# Patient Record
Sex: Male | Born: 1935 | Race: White | Hispanic: No | Marital: Married | Smoking: Never smoker
Health system: Southern US, Community
[De-identification: ages and names within clinical notes are randomized; demographics above are authoritative.]

## PROBLEM LIST (undated history)

## (undated) DIAGNOSIS — H269 Unspecified cataract: Secondary | ICD-10-CM

## (undated) DIAGNOSIS — F0280 Dementia in other diseases classified elsewhere without behavioral disturbance: Secondary | ICD-10-CM

## (undated) DIAGNOSIS — I509 Heart failure, unspecified: Secondary | ICD-10-CM

## (undated) DIAGNOSIS — E785 Hyperlipidemia, unspecified: Secondary | ICD-10-CM

## (undated) DIAGNOSIS — K559 Vascular disorder of intestine, unspecified: Secondary | ICD-10-CM

## (undated) DIAGNOSIS — N400 Enlarged prostate without lower urinary tract symptoms: Secondary | ICD-10-CM

## (undated) DIAGNOSIS — I10 Essential (primary) hypertension: Secondary | ICD-10-CM

## (undated) DIAGNOSIS — I639 Cerebral infarction, unspecified: Secondary | ICD-10-CM

## (undated) DIAGNOSIS — K219 Gastro-esophageal reflux disease without esophagitis: Secondary | ICD-10-CM

## (undated) DIAGNOSIS — Z5189 Encounter for other specified aftercare: Secondary | ICD-10-CM

## (undated) DIAGNOSIS — L719 Rosacea, unspecified: Secondary | ICD-10-CM

## (undated) DIAGNOSIS — F0150 Vascular dementia without behavioral disturbance: Secondary | ICD-10-CM

## (undated) DIAGNOSIS — I629 Nontraumatic intracranial hemorrhage, unspecified: Secondary | ICD-10-CM

## (undated) DIAGNOSIS — F419 Anxiety disorder, unspecified: Secondary | ICD-10-CM

## (undated) DIAGNOSIS — A159 Respiratory tuberculosis unspecified: Secondary | ICD-10-CM

## (undated) DIAGNOSIS — C801 Malignant (primary) neoplasm, unspecified: Secondary | ICD-10-CM

## (undated) DIAGNOSIS — I480 Paroxysmal atrial fibrillation: Secondary | ICD-10-CM

## (undated) DIAGNOSIS — G43909 Migraine, unspecified, not intractable, without status migrainosus: Secondary | ICD-10-CM

## (undated) DIAGNOSIS — I4892 Unspecified atrial flutter: Secondary | ICD-10-CM

## (undated) DIAGNOSIS — G309 Alzheimer's disease, unspecified: Secondary | ICD-10-CM

## (undated) HISTORY — DX: Unspecified atrial flutter: I48.92

## (undated) HISTORY — PX: VASECTOMY: SHX75

## (undated) HISTORY — DX: Nontraumatic intracranial hemorrhage, unspecified: I62.9

## (undated) HISTORY — DX: Malignant (primary) neoplasm, unspecified: C80.1

## (undated) HISTORY — DX: Paroxysmal atrial fibrillation: I48.0

## (undated) HISTORY — DX: Anxiety disorder, unspecified: F41.9

## (undated) HISTORY — DX: Rosacea, unspecified: L71.9

## (undated) HISTORY — DX: Respiratory tuberculosis unspecified: A15.9

## (undated) HISTORY — DX: Gastro-esophageal reflux disease without esophagitis: K21.9

## (undated) HISTORY — DX: Hyperlipidemia, unspecified: E78.5

## (undated) HISTORY — DX: Unspecified cataract: H26.9

## (undated) HISTORY — PX: POLYPECTOMY: SHX149

## (undated) HISTORY — PX: TONSILLECTOMY: SUR1361

## (undated) HISTORY — DX: Essential (primary) hypertension: I10

## (undated) HISTORY — PX: CORNEAL LACERATION REPAIR: SHX5331

## (undated) HISTORY — PX: INGUINAL HERNIA REPAIR: SHX194

## (undated) HISTORY — PX: CATARACT EXTRACTION, BILATERAL: SHX1313

## (undated) HISTORY — PX: COLONOSCOPY: SHX174

## (undated) HISTORY — DX: Cerebral infarction, unspecified: I63.9

## (undated) HISTORY — PX: CATARACT EXTRACTION: SUR2

## (undated) HISTORY — DX: Migraine, unspecified, not intractable, without status migrainosus: G43.909

## (undated) HISTORY — PX: EYE SURGERY: SHX253

## (undated) HISTORY — DX: Heart failure, unspecified: I50.9

## (undated) HISTORY — DX: Vascular disorder of intestine, unspecified: K55.9

## (undated) HISTORY — PX: APPENDECTOMY: SHX54

## (undated) HISTORY — DX: Encounter for other specified aftercare: Z51.89

## (undated) HISTORY — DX: Benign prostatic hyperplasia without lower urinary tract symptoms: N40.0

---

## 1986-01-01 ENCOUNTER — Encounter: Payer: Self-pay | Admitting: Internal Medicine

## 1987-01-14 ENCOUNTER — Encounter: Payer: Self-pay | Admitting: Internal Medicine

## 1990-04-04 ENCOUNTER — Encounter: Payer: Self-pay | Admitting: Internal Medicine

## 1990-04-05 ENCOUNTER — Encounter: Payer: Self-pay | Admitting: Internal Medicine

## 1996-01-10 ENCOUNTER — Encounter: Payer: Self-pay | Admitting: Internal Medicine

## 1998-01-07 ENCOUNTER — Emergency Department (HOSPITAL_COMMUNITY): Admission: EM | Admit: 1998-01-07 | Discharge: 1998-01-07 | Payer: Self-pay

## 1998-10-02 ENCOUNTER — Encounter: Payer: Self-pay | Admitting: Specialist

## 1998-10-02 ENCOUNTER — Ambulatory Visit (HOSPITAL_COMMUNITY): Admission: RE | Admit: 1998-10-02 | Discharge: 1998-10-02 | Payer: Self-pay | Admitting: Specialist

## 2001-01-18 ENCOUNTER — Ambulatory Visit (HOSPITAL_COMMUNITY): Admission: RE | Admit: 2001-01-18 | Discharge: 2001-01-18 | Payer: Self-pay | Admitting: Gastroenterology

## 2001-01-18 ENCOUNTER — Encounter: Payer: Self-pay | Admitting: Internal Medicine

## 2001-01-18 ENCOUNTER — Encounter (INDEPENDENT_AMBULATORY_CARE_PROVIDER_SITE_OTHER): Payer: Self-pay | Admitting: *Deleted

## 2001-01-18 ENCOUNTER — Encounter (INDEPENDENT_AMBULATORY_CARE_PROVIDER_SITE_OTHER): Payer: Self-pay

## 2004-01-28 ENCOUNTER — Encounter: Payer: Self-pay | Admitting: Internal Medicine

## 2006-03-08 ENCOUNTER — Encounter: Payer: Self-pay | Admitting: Internal Medicine

## 2008-02-28 ENCOUNTER — Encounter (INDEPENDENT_AMBULATORY_CARE_PROVIDER_SITE_OTHER): Payer: Self-pay | Admitting: *Deleted

## 2008-02-28 ENCOUNTER — Inpatient Hospital Stay (HOSPITAL_COMMUNITY): Admission: EM | Admit: 2008-02-28 | Discharge: 2008-03-05 | Payer: Self-pay | Admitting: Emergency Medicine

## 2008-02-28 ENCOUNTER — Ambulatory Visit: Payer: Self-pay | Admitting: Internal Medicine

## 2008-02-29 ENCOUNTER — Encounter (INDEPENDENT_AMBULATORY_CARE_PROVIDER_SITE_OTHER): Payer: Self-pay | Admitting: *Deleted

## 2008-02-29 ENCOUNTER — Encounter (INDEPENDENT_AMBULATORY_CARE_PROVIDER_SITE_OTHER): Payer: Self-pay | Admitting: Cardiology

## 2008-03-01 ENCOUNTER — Encounter (INDEPENDENT_AMBULATORY_CARE_PROVIDER_SITE_OTHER): Payer: Self-pay | Admitting: *Deleted

## 2008-03-02 ENCOUNTER — Encounter: Payer: Self-pay | Admitting: Internal Medicine

## 2008-03-02 ENCOUNTER — Encounter (INDEPENDENT_AMBULATORY_CARE_PROVIDER_SITE_OTHER): Payer: Self-pay | Admitting: *Deleted

## 2008-03-05 ENCOUNTER — Encounter (INDEPENDENT_AMBULATORY_CARE_PROVIDER_SITE_OTHER): Payer: Self-pay | Admitting: *Deleted

## 2008-03-14 ENCOUNTER — Encounter: Payer: Self-pay | Admitting: Internal Medicine

## 2008-03-21 DIAGNOSIS — M109 Gout, unspecified: Secondary | ICD-10-CM | POA: Insufficient documentation

## 2008-03-21 DIAGNOSIS — G43909 Migraine, unspecified, not intractable, without status migrainosus: Secondary | ICD-10-CM | POA: Insufficient documentation

## 2008-03-21 DIAGNOSIS — Z85828 Personal history of other malignant neoplasm of skin: Secondary | ICD-10-CM | POA: Insufficient documentation

## 2008-03-21 DIAGNOSIS — N400 Enlarged prostate without lower urinary tract symptoms: Secondary | ICD-10-CM | POA: Insufficient documentation

## 2008-03-21 DIAGNOSIS — Z8611 Personal history of tuberculosis: Secondary | ICD-10-CM | POA: Insufficient documentation

## 2008-03-21 DIAGNOSIS — F411 Generalized anxiety disorder: Secondary | ICD-10-CM | POA: Insufficient documentation

## 2008-03-21 DIAGNOSIS — Z8679 Personal history of other diseases of the circulatory system: Secondary | ICD-10-CM | POA: Insufficient documentation

## 2008-03-21 DIAGNOSIS — K5909 Other constipation: Secondary | ICD-10-CM | POA: Insufficient documentation

## 2008-03-21 DIAGNOSIS — Z87898 Personal history of other specified conditions: Secondary | ICD-10-CM | POA: Insufficient documentation

## 2008-03-21 DIAGNOSIS — E785 Hyperlipidemia, unspecified: Secondary | ICD-10-CM | POA: Insufficient documentation

## 2008-03-21 DIAGNOSIS — I10 Essential (primary) hypertension: Secondary | ICD-10-CM | POA: Insufficient documentation

## 2008-03-21 DIAGNOSIS — D126 Benign neoplasm of colon, unspecified: Secondary | ICD-10-CM | POA: Insufficient documentation

## 2008-03-21 DIAGNOSIS — L719 Rosacea, unspecified: Secondary | ICD-10-CM | POA: Insufficient documentation

## 2008-03-22 ENCOUNTER — Ambulatory Visit: Payer: Self-pay | Admitting: Internal Medicine

## 2008-03-22 DIAGNOSIS — R933 Abnormal findings on diagnostic imaging of other parts of digestive tract: Secondary | ICD-10-CM | POA: Insufficient documentation

## 2008-03-22 DIAGNOSIS — Z860100 Personal history of colon polyps, unspecified: Secondary | ICD-10-CM | POA: Insufficient documentation

## 2008-03-22 DIAGNOSIS — R1031 Right lower quadrant pain: Secondary | ICD-10-CM | POA: Insufficient documentation

## 2008-03-27 DIAGNOSIS — K559 Vascular disorder of intestine, unspecified: Secondary | ICD-10-CM | POA: Insufficient documentation

## 2008-04-24 ENCOUNTER — Encounter: Payer: Self-pay | Admitting: Internal Medicine

## 2008-04-24 ENCOUNTER — Ambulatory Visit: Payer: Self-pay | Admitting: Internal Medicine

## 2008-04-26 ENCOUNTER — Encounter: Payer: Self-pay | Admitting: Internal Medicine

## 2008-05-08 ENCOUNTER — Telehealth: Payer: Self-pay | Admitting: Internal Medicine

## 2008-08-01 ENCOUNTER — Encounter (INDEPENDENT_AMBULATORY_CARE_PROVIDER_SITE_OTHER): Payer: Self-pay | Admitting: *Deleted

## 2008-08-01 ENCOUNTER — Ambulatory Visit (HOSPITAL_BASED_OUTPATIENT_CLINIC_OR_DEPARTMENT_OTHER): Admission: RE | Admit: 2008-08-01 | Discharge: 2008-08-01 | Payer: Self-pay | Admitting: Surgery

## 2008-08-01 ENCOUNTER — Encounter (INDEPENDENT_AMBULATORY_CARE_PROVIDER_SITE_OTHER): Payer: Self-pay | Admitting: Surgery

## 2009-01-21 ENCOUNTER — Encounter: Admission: RE | Admit: 2009-01-21 | Discharge: 2009-01-21 | Payer: Self-pay | Admitting: Cardiology

## 2009-02-27 ENCOUNTER — Ambulatory Visit: Payer: Self-pay | Admitting: Internal Medicine

## 2009-02-27 ENCOUNTER — Encounter: Payer: Self-pay | Admitting: Internal Medicine

## 2009-02-27 DIAGNOSIS — R0609 Other forms of dyspnea: Secondary | ICD-10-CM | POA: Insufficient documentation

## 2009-02-27 DIAGNOSIS — R0989 Other specified symptoms and signs involving the circulatory and respiratory systems: Secondary | ICD-10-CM | POA: Insufficient documentation

## 2009-02-28 ENCOUNTER — Encounter: Payer: Self-pay | Admitting: Internal Medicine

## 2009-03-05 ENCOUNTER — Telehealth: Payer: Self-pay | Admitting: Internal Medicine

## 2009-03-05 ENCOUNTER — Encounter: Payer: Self-pay | Admitting: Internal Medicine

## 2009-03-14 ENCOUNTER — Ambulatory Visit: Payer: Self-pay | Admitting: Internal Medicine

## 2009-03-14 LAB — CONVERTED CEMR LAB
BUN: 15 mg/dL (ref 6–23)
Basophils Absolute: 0 10*3/uL (ref 0.0–0.1)
CO2: 30 meq/L (ref 19–32)
Chloride: 106 meq/L (ref 96–112)
Creatinine, Ser: 1.1 mg/dL (ref 0.4–1.5)
Eosinophils Absolute: 0.1 10*3/uL (ref 0.0–0.7)
MCHC: 34 g/dL (ref 30.0–36.0)
MCV: 92 fL (ref 78.0–100.0)
Monocytes Absolute: 0.5 10*3/uL (ref 0.1–1.0)
Neutrophils Relative %: 76.3 % (ref 43.0–77.0)
Platelets: 207 10*3/uL (ref 150.0–400.0)
Prothrombin Time: 28.1 s — ABNORMAL HIGH (ref 9.1–11.7)
WBC: 7.4 10*3/uL (ref 4.5–10.5)
aPTT: 42.5 s — ABNORMAL HIGH (ref 21.7–28.8)

## 2009-03-15 ENCOUNTER — Encounter (INDEPENDENT_AMBULATORY_CARE_PROVIDER_SITE_OTHER): Payer: Self-pay | Admitting: *Deleted

## 2009-03-27 ENCOUNTER — Encounter: Payer: Self-pay | Admitting: Internal Medicine

## 2009-03-28 ENCOUNTER — Telehealth: Payer: Self-pay | Admitting: Internal Medicine

## 2009-04-03 ENCOUNTER — Ambulatory Visit: Payer: Self-pay | Admitting: Internal Medicine

## 2009-04-03 LAB — CONVERTED CEMR LAB
BUN: 13 mg/dL (ref 6–23)
Creatinine, Ser: 1 mg/dL (ref 0.4–1.5)
Eosinophils Relative: 1 % (ref 0.0–5.0)
GFR calc non Af Amer: 78.11 mL/min (ref >60)
Monocytes Absolute: 0.6 10*3/uL (ref 0.1–1.0)
Monocytes Relative: 7.1 % (ref 3.0–12.0)
Neutrophils Relative %: 77.5 % — ABNORMAL HIGH (ref 43.0–77.0)
Platelets: 201 10*3/uL (ref 150.0–400.0)
Prothrombin Time: 26.7 s — ABNORMAL HIGH (ref 9.1–11.7)
WBC: 7.9 10*3/uL (ref 4.5–10.5)
aPTT: 38.7 s — ABNORMAL HIGH (ref 21.7–28.8)

## 2009-04-08 ENCOUNTER — Ambulatory Visit: Payer: Self-pay | Admitting: Internal Medicine

## 2009-04-08 ENCOUNTER — Ambulatory Visit (HOSPITAL_COMMUNITY): Admission: RE | Admit: 2009-04-08 | Discharge: 2009-04-08 | Payer: Self-pay | Admitting: Internal Medicine

## 2009-04-08 ENCOUNTER — Encounter: Payer: Self-pay | Admitting: Internal Medicine

## 2009-04-09 ENCOUNTER — Ambulatory Visit (HOSPITAL_COMMUNITY): Admission: RE | Admit: 2009-04-09 | Discharge: 2009-04-10 | Payer: Self-pay | Admitting: Internal Medicine

## 2009-04-09 ENCOUNTER — Ambulatory Visit: Payer: Self-pay | Admitting: Internal Medicine

## 2009-04-09 HISTORY — PX: OTHER SURGICAL HISTORY: SHX169

## 2009-04-17 ENCOUNTER — Ambulatory Visit: Payer: Self-pay | Admitting: Internal Medicine

## 2009-04-24 ENCOUNTER — Telehealth: Payer: Self-pay | Admitting: Internal Medicine

## 2009-04-25 ENCOUNTER — Telehealth: Payer: Self-pay | Admitting: Internal Medicine

## 2009-04-29 ENCOUNTER — Telehealth: Payer: Self-pay | Admitting: Internal Medicine

## 2009-05-01 ENCOUNTER — Encounter: Payer: Self-pay | Admitting: Internal Medicine

## 2009-05-01 ENCOUNTER — Ambulatory Visit: Payer: Self-pay

## 2009-05-08 ENCOUNTER — Encounter: Payer: Self-pay | Admitting: Internal Medicine

## 2009-05-14 ENCOUNTER — Ambulatory Visit (HOSPITAL_BASED_OUTPATIENT_CLINIC_OR_DEPARTMENT_OTHER): Admission: RE | Admit: 2009-05-14 | Discharge: 2009-05-14 | Payer: Self-pay | Admitting: Cardiology

## 2009-05-18 ENCOUNTER — Ambulatory Visit: Payer: Self-pay | Admitting: Internal Medicine

## 2009-05-29 ENCOUNTER — Ambulatory Visit: Payer: Self-pay | Admitting: Internal Medicine

## 2009-06-25 ENCOUNTER — Encounter: Payer: Self-pay | Admitting: Internal Medicine

## 2009-07-10 ENCOUNTER — Encounter: Payer: Self-pay | Admitting: Internal Medicine

## 2009-07-11 ENCOUNTER — Ambulatory Visit: Payer: Self-pay | Admitting: Internal Medicine

## 2009-07-19 ENCOUNTER — Ambulatory Visit: Payer: Self-pay | Admitting: Internal Medicine

## 2009-07-19 ENCOUNTER — Ambulatory Visit (HOSPITAL_COMMUNITY): Admission: RE | Admit: 2009-07-19 | Discharge: 2009-07-19 | Payer: Self-pay | Admitting: Internal Medicine

## 2009-08-26 ENCOUNTER — Ambulatory Visit: Payer: Self-pay | Admitting: Internal Medicine

## 2009-08-27 ENCOUNTER — Telehealth: Payer: Self-pay | Admitting: Internal Medicine

## 2009-09-30 ENCOUNTER — Encounter: Payer: Self-pay | Admitting: Internal Medicine

## 2009-10-08 ENCOUNTER — Ambulatory Visit: Payer: Self-pay | Admitting: Cardiology

## 2009-10-08 ENCOUNTER — Ambulatory Visit (HOSPITAL_COMMUNITY): Admission: RE | Admit: 2009-10-08 | Discharge: 2009-10-08 | Payer: Self-pay | Admitting: Internal Medicine

## 2009-10-08 ENCOUNTER — Ambulatory Visit: Payer: Self-pay | Admitting: Internal Medicine

## 2009-10-08 LAB — CONVERTED CEMR LAB
Basophils Absolute: 0.1 10*3/uL (ref 0.0–0.1)
Basophils Relative: 0.7 % (ref 0.0–3.0)
CO2: 27 meq/L (ref 19–32)
Chloride: 107 meq/L (ref 96–112)
Eosinophils Absolute: 0.1 10*3/uL (ref 0.0–0.7)
Glucose, Bld: 93 mg/dL (ref 70–99)
HCT: 46.4 % (ref 39.0–52.0)
Hemoglobin: 16.2 g/dL (ref 13.0–17.0)
Lymphs Abs: 1.2 10*3/uL (ref 0.7–4.0)
MCHC: 35 g/dL (ref 30.0–36.0)
Neutro Abs: 5.2 10*3/uL (ref 1.4–7.7)
Potassium: 4.3 meq/L (ref 3.5–5.1)
RBC: 5.19 M/uL (ref 4.22–5.81)
RDW: 14 % (ref 11.5–14.6)
Sodium: 142 meq/L (ref 135–145)
aPTT: 46 s — ABNORMAL HIGH (ref 21.7–28.8)

## 2009-10-14 ENCOUNTER — Ambulatory Visit (HOSPITAL_COMMUNITY): Admission: RE | Admit: 2009-10-14 | Discharge: 2009-10-14 | Payer: Self-pay | Admitting: Internal Medicine

## 2009-10-14 ENCOUNTER — Encounter: Payer: Self-pay | Admitting: Internal Medicine

## 2009-10-14 ENCOUNTER — Ambulatory Visit: Payer: Self-pay | Admitting: Internal Medicine

## 2009-10-15 ENCOUNTER — Ambulatory Visit (HOSPITAL_COMMUNITY): Admission: RE | Admit: 2009-10-15 | Discharge: 2009-10-16 | Payer: Self-pay | Admitting: Internal Medicine

## 2009-10-15 ENCOUNTER — Ambulatory Visit: Payer: Self-pay | Admitting: Internal Medicine

## 2009-10-15 HISTORY — PX: ABLATION OF DYSRHYTHMIC FOCUS: SHX254

## 2009-10-16 ENCOUNTER — Telehealth: Payer: Self-pay | Admitting: Internal Medicine

## 2009-11-08 ENCOUNTER — Encounter: Payer: Self-pay | Admitting: Physician Assistant

## 2009-11-22 ENCOUNTER — Ambulatory Visit: Payer: Self-pay | Admitting: Cardiovascular Disease

## 2009-11-22 ENCOUNTER — Encounter: Payer: Self-pay | Admitting: Physician Assistant

## 2010-01-02 ENCOUNTER — Ambulatory Visit: Payer: Self-pay | Admitting: Internal Medicine

## 2010-01-02 DIAGNOSIS — D689 Coagulation defect, unspecified: Secondary | ICD-10-CM | POA: Insufficient documentation

## 2010-01-02 DIAGNOSIS — I482 Chronic atrial fibrillation, unspecified: Secondary | ICD-10-CM | POA: Insufficient documentation

## 2010-01-02 DIAGNOSIS — I4891 Unspecified atrial fibrillation: Secondary | ICD-10-CM | POA: Insufficient documentation

## 2010-01-22 ENCOUNTER — Ambulatory Visit
Admission: RE | Admit: 2010-01-22 | Discharge: 2010-01-22 | Payer: Self-pay | Source: Home / Self Care | Attending: Internal Medicine | Admitting: Internal Medicine

## 2010-01-22 ENCOUNTER — Encounter: Payer: Self-pay | Admitting: Internal Medicine

## 2010-01-28 ENCOUNTER — Ambulatory Visit
Admission: RE | Admit: 2010-01-28 | Discharge: 2010-01-28 | Payer: Self-pay | Source: Home / Self Care | Attending: Internal Medicine | Admitting: Internal Medicine

## 2010-01-28 ENCOUNTER — Other Ambulatory Visit: Payer: Self-pay | Admitting: Internal Medicine

## 2010-01-28 DIAGNOSIS — D126 Benign neoplasm of colon, unspecified: Secondary | ICD-10-CM

## 2010-02-02 LAB — CONVERTED CEMR LAB
BUN: 20 mg/dL (ref 6–23)
Basophils Relative: 0.5 % (ref 0.0–3.0)
Bilirubin, Direct: 0.1 mg/dL (ref 0.0–0.3)
CO2: 23 meq/L (ref 19–32)
Chloride: 106 meq/L (ref 96–112)
Eosinophils Relative: 1.2 % (ref 0.0–5.0)
Glucose, Bld: 80 mg/dL (ref 70–99)
MCV: 90.8 fL (ref 78.0–100.0)
Monocytes Absolute: 0.7 10*3/uL (ref 0.1–1.0)
Monocytes Relative: 7.8 % (ref 3.0–12.0)
Neutrophils Relative %: 75.7 % (ref 43.0–77.0)
Potassium: 4.2 meq/L (ref 3.5–5.1)
RBC: 5.05 M/uL (ref 4.22–5.81)
TSH: 0.78 microintl units/mL (ref 0.35–5.50)
Total Bilirubin: 0.5 mg/dL (ref 0.3–1.2)
Total Protein: 6.4 g/dL (ref 6.0–8.3)
WBC: 8.3 10*3/uL (ref 4.5–10.5)
aPTT: 51.2 s — ABNORMAL HIGH (ref 21.7–28.8)

## 2010-02-03 ENCOUNTER — Encounter: Payer: Self-pay | Admitting: Internal Medicine

## 2010-02-04 NOTE — Letter (Signed)
Summary: Cardioversion/TEE Instructions  [facility]ural [facility], Main Office  [address]   [place], [place] [address]   Phone: [phone]  Fax: [phone]     TEE  Instructions  You are scheduled for a TEE  on March 20, 2009 with Dr. Jens Som.   Please arrive at the [facility] at 1100 a.m. on the day of your procedure.  1)   DIET:  A)   Nothing to eat or drink after midnight except your medications with a sip of water.   2)   Come to the [place] office on March 14, 2009 for lab work. The lab at [facility] is open from 8:30 a.m. to 1:30 p.m. and 2:30 p.m. to 5:00 p.m. The lab at [facility] is open from 7:30 a.m. to 5:30 p.m. You do not have to be fasting.  3)   MAKE SURE YOU TAKE YOUR COUMADIN.  A)   YOU MAY TAKE ALL of your remaining medications with a small amount of water.    4)  Must have a responsible person to drive you home.  5)   Bring a current list of your medications and current insurance cards.   * Special Note:  Every effort is made to have your procedure done on time. Occasionally there are emergencies that present themselves at the hospital that may cause delays. Please be patient if a delay does occur.  * If you have any questions after you get home, please call the office at [phone].

## 2010-02-04 NOTE — Letter (Signed)
Summary: ELectrophysiology/Ablation Procedure Instructions  [employer], Main Office  [address]   [place], [place] [address]   Phone: [phone]  Fax: [phone]     Electrophysiology/Ablation Procedure Instructions    You are scheduled for a(n) afib ablation on 10/15/09 at 7:30am with Dr. Johney Frame.  1.  Please come to the Short Stay Center at [facility] at 5:30am on the day of your procedure.  2.  Come prepared to stay overnight.   Please bring your insurance cards and a list of your medications.  3.  Come to the [place] office on 10/08/09 at 8:30am for lab work.  You do not have to be fasting.  4.  Do not have anything to eat or drink after midnight the night before your procedure.  5.    All of your remaining medications may be taken with a small amount of water.  6.  Educational material received:  Ablation   * Occasionally, EP studies and ablations can become lengthy.  Please make your family aware of this before your procedure starts.  Average time ranges from 2-8 hours for EP studies/ablations.  Your physician will locate your family after the procedure with the results.  * If you have any questions after you get home, please call the office at [phone]. [name]  TEE--10/14/09 at 10:00.  Please check in at Short Stay Center at 9:00am  Nothing to eat or drink after midnight the night prior to procedure  CT-- is scheduled for 10/08/09 at 1:00  Please check in at 12:30pm at Admitting at [facility] Nothing to eat or drink 4 hour prior

## 2010-02-04 NOTE — Letter (Signed)
Summary: Dean Pratt's Office Visit Note   Dean Pratt's Office Visit Note   Imported By: Roderic Ovens 12/12/2009 14:41:23  _____________________________________________________________________  External Attachment:    Type:   Image     Comment:   External Document

## 2010-02-04 NOTE — Letter (Signed)
Summary: Cardioversion/TEE Instructions  Architectural technologist, Main Office  [address]   [place], [place] [address]   Phone: [phone]  Fax: [phone]    Cardioversion / TEE Cardioversion Instructions  You are scheduled for a Cardioversion on July 19, 2009 with Dr. Johney Frame   Please arrive at the [facility] at 8:30 a.m. on the day of your procedure.  1)   DIET:  A)   Nothing to eat or drink after midnight except your medications with a sip of water.  B)   May have clear liquid breakfast, then nothing to eat or drink after 8:00 a.m.       Clear liquids include:  water, broth, Sprite, Ginger Ale, black coffee, tea (no sugar),      cranberry / grape / apple juice, jello (not red), popsicle from clear juices (not red).  2)    lab work today        3   MAKE SURE YOU TAKE YOUR COUMADIN.  B)   YOU MAY TAKE ALL of your remaining medications with a small amount of water.  4  Must have a responsible person to drive you home.  5   Bring a current list of your medications and current insurance cards.   * Special Note:  Every effort is made to have your procedure done on time. Occasionally there are emergencies that present themselves at the hospital that may cause delays. Please be patient if a delay does occur.  * If you have any questions after you get home, please call the office at [phone].

## 2010-02-04 NOTE — Letter (Signed)
Summary: Dean Pratt's Office Note  Dean Pratt's Office Note   Imported By: Roderic Ovens 04/09/2009 16:15:57  _____________________________________________________________________  External Attachment:    Type:   Image     Comment:   External Document

## 2010-02-04 NOTE — Assessment & Plan Note (Signed)
Summary: nep/ DISCUSS ABLATION / PT HAS WELL PATH/ GD   Visit Type:  Initial Consult Referring Provider:  Viann Fish, MD Primary Provider:  [name], MD  CC:  afib and atrial flutter.  History of Present Illness: [name] is a pleasant 75 yo WM with a history of paroxymal atrial fibrillation and typical appearing atrial flutter who presents today for EP consultation regarding his atrial arrhythmias.  He reports initially being diagnosed with atrial fibrillation 4-5 years ago after presenting for a routine physical and having afib.  Since that time, he has been treated with flecainide, digoxin, and diltiazem.  He continues to have increasing frequency and duration of both atrial fibrillation and atrial flutter despite these medicines.  He has also had post termination pauses on monitor up to 3 seconds.   He denies presyncope or syncope.   He reports symptoms of weakness, fatigue, and irritability during afib.  He has not previously required cardioversion.  He is appropriately anticoagulated with coumadin.  He denies CP, SOB, orthopnea, PND, or other concerns.  He is unaware of triggers/ precipitants for afib and denies h/o rheumatic fever.  Current Medications (verified): 1)  Flecainide Acetate 100 Mg Tabs (Flecainide Acetate) .Marland Kitchen.. 1 1/2 Tablet By Mouth Two Times A Day 2)  Lanoxin 0.25 Mg Tabs (Digoxin) .Marland Kitchen.. 1 Tablet By Mouth Once Daily 3)  Warfarin Sodium 3 Mg Tabs (Warfarin Sodium) .... Use As Directed By Anticoagualtion Clinic 4)  Allopurinol 100 Mg Tabs (Allopurinol) .Marland Kitchen.. 1 Tablet By Mouth At Bedtime 5)  Crestor 10 Mg Tabs (Rosuvastatin Calcium) .Marland Kitchen.. 1 Tablet By Mouth Qd 6)  Micardis 40 Mg Tabs (Telmisartan) .Marland Kitchen.. 1 Tablet By Mouth Once Daily 7)  Aspirin 81 Mg Tbec (Aspirin) .Marland Kitchen.. 1 Tablet By Mouth Once Daily 8)  Lorazepam 0.5 Mg Tabs (Lorazepam) .Marland Kitchen.. 1 Tablet By Mouth 1-2 Times Daily As Needed 9)  Vitamin C Cr 1000 Mg Cr-Tabs (Ascorbic Acid) .... 2 Tablets By Mouth Once Daily 10)   Tiazac 240 Mg Xr24h-Cap (Diltiazem Hcl Er Beads) .Marland Kitchen.. 1 Capsule By Mouth Once Daily 11)  Indomethacin 25 Mg Caps (Indomethacin) .... As Needed For Gout  Allergies (verified): No Known Drug Allergies  Past History:  Past Medical History: Paroxysmal atrial fibrillation Atrial flutter h/o ischemic colitis (per pt) HTN HL Migraines Gout Rosacea Basal Cell CA removed h/o tuberculosis constipation  Past Surgical History: Reviewed history from 03/21/2008 and no changes required. Appendectomy inguinal herniorrhaphy Tonsillectomy Vasectomy  Family History: Family History of Breast Cancer: Sister No FH of Colon Cancer: Family History of Liver Cancer: Father Family History of Prostate Cancer: Father   Father died of prostate cancer and coronary artery   disease at age 64.  Mother died of COPD at age 22.  Brother and a sister are living.   Social History: Pt lives in [place].    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  He is  married and lives at home with his wife.  He used to smoke but quit  smoking many years ago.  Does get regular exercise.  No alcohol use.   Review of Systems       All systems are reviewed and negative except as listed in the HPI.  Pt reports that he snores occasionally.  Vital Signs:  Patient profile:   75 year old male Height:      70 inches Weight:      166 pounds BMI:  23.90 Pulse rate:   61 / minute BP sitting:   116 / 82  (left arm)  Vitals Entered By: Laurance Flatten CMA (February 27, 2009 11:31 AM)  Physical Exam  General:  Well developed, well nourished, in no acute distress. Head:  normocephalic and atraumatic Eyes:  PERRLA/EOM intact; conjunctiva and lids normal. Nose:  no deformity, discharge, inflammation, or lesions Mouth:  Teeth, gums and palate normal. Oral mucosa normal. Neck:  Neck supple, no JVD. No masses, thyromegaly or abnormal cervical nodes. Lungs:  Clear bilaterally to auscultation and  percussion. Heart:  iRRR, no m/r/g Abdomen:  Bowel sounds positive; abdomen soft and non-tender without masses, organomegaly, or hernias noted. No hepatosplenomegaly. Msk:  Back normal, normal gait. Muscle strength and tone normal. Pulses:  pulses normal in all 4 extremities Extremities:  No clubbing or cyanosis. Neurologic:  Alert and oriented x 3. Skin:  Intact without lesions or rashes. Cervical Nodes:  no significant adenopathy Psych:  Normal affect.   Echocardiogram  Procedure date:  02/29/2008  Findings:       SUMMARY   -  Overall left ventricular systolic function was normal. Left         ventricular ejection fraction was estimated to be 60 %. There         were no left ventricular regional wall motion abnormalities.         Left ventricular wall thickness was mildly to moderately         increased.   -  There was mild to moderate mitral valvular regurgitation.   -  The left atrium was moderately dilated.   -  Estimated peak pulmonary artery systolic pressure 27 mmHg.   -  The right atrium was mild to moderately dilated.   -  The inferior vena cava was mildly dilated.     ---------------------------------------------------------------    Prepared and Electronically Authenticated by    Viann Fish M.D.  CXR  Procedure date:  01/21/2009  Findings:       Findings: The heart is upper normal in size.  Increased AP diameter   of the chest is present, likely related to COPD.  Lungs are clear.   No pneumothorax or pleural effusion.    IMPRESSION:   Chronic changes.  No active cardiopulmonary disease.    Read By:  Jolaine Click,  M.D.       EKG  Procedure date:  02/27/2009  Findings:      afib, V rate 60s, IVCD (QRS 136)  Impression & Recommendations:  Problem # 1:  ATRIAL FIBRILLATION, HX OF (ICD-V12.59) [name] is a pleasant 75 yo WM with a history of paroxymal atrial fibrillation and typical appearing atrial flutter who presents today for EP  consultation regarding his atrial arrhythmias.  He has failed medical therapy with flecainide, digoxin, and diltiazem.  Therapeutic strategies for atrial fibrillation and atrial flutter including medicine and ablation were discussed in detail with the patient today. Risk, benefits, and alternatives to EP study and radiofrequency ablation were also discussed in detail today. These risks include but are not limited to stroke, bleeding, vascular damage, tamponade, perforation, damage to the esophagus, lungs, and other structures, pulmonary vein stenosis, worsening renal function, and death. The patient understands these risk and wishes to proceed.  We will therefore schedule his ablatioin at the next available time.  Problem # 2:  HYPERTENSION NEC (ICD-997.91) stable  Problem # 3:  SNORING (ICD-786.09) The patient reports snoring at night.  I have recommended  that he discuss sleep study referral with Dr Donnie Aho as OSA is linked to afib and may affect our longterm success if present.  Other Orders: EKG w/ Interpretation (93000)  Patient Instructions: 1)  Your physician recommends that you HAVE WEEKLY INR CHECKS AT DR. TILLEYS OFFICE 2)  Your physician has recommended that you have an ablation.  Catheter ablation is a medical procedure used to treat some cardiac arrhythmias (irregular heartbeats). During catheter ablation, a long, thin, flexible tube is put into a blood vessel in your groin (upper thigh), or neck. This tube is called an ablation catheter. It is then guided to your heart through the blood vessel. Radiofrequency waves destroy small areas of heart tissue where abnormal heartbeats may cause an arrhythmia to start.  Please see the instruction sheet given to you today. 3)  Your physician has requested that you have a TEE.  During a TEE, sound waves are used to create images of your heart. It provides your doctor with information about the size and shape of your heart and how well your heart's  chambers and valves are working. In this test, a transducer is attached to the end of a flexible tube that's guided down your throat and into your esophagus (the tube leading from your mouth to your stomach) to get a more detailed image of your heart. You are not awake for the procedure. Please see the instruction sheet given to you today.  For further information please visit [email or web address]

## 2010-02-04 NOTE — Assessment & Plan Note (Signed)
Summary: per check out/sf   Visit Type:  Follow-up Referring Provider:  Viann Fish, MD Primary Provider:  [name], MD  CC:  Right shoulder pain.  History of Present Illness: The patient presents today for routine electrophysiology followup. He reports doing well since his ablation.  He states "I feel better than I have in a very long time".  He is unaware of any further afib.     The patient denies symptoms of chest pain, palpitaitons, fatigue, shortness of breath, orthopnea, PND, lower extremity edema, dizziness, presyncope, syncope,or neurologic sequela. The patient is tolerating medications without difficulties and is otherwise without complaint today.   Current Medications (verified): 1)  Warfarin Sodium 3 Mg Tabs (Warfarin Sodium) .... Use As Directed By Anticoagualtion Clinic 2)  Allopurinol 100 Mg Tabs (Allopurinol) .Marland Kitchen.. 1 Tablet By Mouth At Bedtime 3)  Crestor 10 Mg Tabs (Rosuvastatin Calcium) .Marland Kitchen.. 1 Tablet By Mouth Qd 4)  Aspirin 81 Mg Tbec (Aspirin) .Marland Kitchen.. 1 Tablet By Mouth Once Daily 5)  Lorazepam 0.5 Mg Tabs (Lorazepam) .Marland Kitchen.. 1 Tablet By Mouth 1-2 Times Daily As Needed 6)  Vitamin C Cr 1000 Mg Cr-Tabs (Ascorbic Acid) .... 2 Tablets By Mouth Once Daily 7)  Tiazac 240 Mg Xr24h-Cap (Diltiazem Hcl Er Beads) .Marland Kitchen.. 1 Capsule By Mouth Once Daily 8)  Indomethacin 25 Mg Caps (Indomethacin) .... As Needed For Gout 9)  Pantoprazole Sodium 40 Mg Tbec (Pantoprazole Sodium) .... Take One Tablet By Mouth Once Daily. 10)  Hydrocodone-Acetaminophen 5-500 Mg Tabs (Hydrocodone-Acetaminophen) .... As Needed 11)  Amiodarone Hcl 200 Mg Tabs (Amiodarone Hcl) .... Take 1 Tablet By Mouth Once A Day  Allergies (verified): No Known Drug Allergies  Past History:  Past Medical History: Reviewed history from 04/17/2009 and no changes required. Paroxysmal atrial fibrillation Atrial flutter s/p afib and atrial flutter ablation 04/09/09 h/o ischemic colitis (per  pt) HTN HL Migraines Gout Rosacea Basal Cell CA removed h/o tuberculosis constipation  Past Surgical History: Reviewed history from 04/17/2009 and no changes required. Appendectomy inguinal herniorrhaphy Tonsillectomy Vasectomy s/p afib and atrial flutter ablation 04/09/09  Family History: Reviewed history from 02/27/2009 and no changes required. Family History of Breast Cancer: Sister No FH of Colon Cancer: Family History of Liver Cancer: Father Family History of Prostate Cancer: Father   Father died of prostate cancer and coronary artery   disease at age 54.  Mother died of COPD at age 58.  Brother and a sister are living.   Social History: Reviewed history from 02/27/2009 and no changes required. Pt lives in [place].    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  He is  married and lives at home with his wife.  He used to smoke but quit  smoking many years ago.  Does get regular exercise.  No alcohol use.   Review of Systems       All systems are reviewed and negative except as listed in the HPI.   Vital Signs:  Patient profile:   75 year old male Height:      70 inches Weight:      166.50 pounds BMI:     23.98 Pulse rate:   68 / minute Pulse rhythm:   irregular Resp:     18 per minute BP sitting:   118 / 90  (left arm) Cuff size:   large  Vitals Entered By: Vikki Ports (July 11, 2009 10:47 AM)  Physical Exam  General:  Well  developed, well nourished, in no acute distress. Head:  normocephalic and atraumatic Eyes:  PERRLA/EOM intact; conjunctiva and lids normal. Mouth:  Teeth, gums and palate normal. Oral mucosa normal. Neck:  Neck supple, no JVD. No masses, thyromegaly or abnormal cervical nodes. Lungs:  Clear bilaterally to auscultation and percussion. Heart:  iRRR, no m/r/g Abdomen:  Bowel sounds positive; abdomen soft and non-tender without masses, organomegaly, or hernias noted. No hepatosplenomegaly. Msk:  Back  normal, normal gait. Muscle strength and tone normal. Pulses:  pulses normal in all 4 extremities Extremities:  No clubbing or cyanosis. Neurologic:  Alert and oriented x 3. Skin:  Intact without lesions or rashes. Psych:  Normal affect.   EKG  Procedure date:  07/11/2009  Findings:      atypical atrial flutter, V rates 70, otherwise normal ekg  Impression & Recommendations:  Problem # 1:  ATRIAL FIBRILLATION, HX OF (ICD-V12.59)  The patient has a h/o both afib and atypical atrial flutter.  He is s/p ablation for his afib.  He had CTI ablation performed at the same time.  He also was noted to have multiple atypical atrial flutter circuits during the ablation which could not be mapped as they were unstable and alternated from one to another.  He now presents with atypical left atrial flutter despite amiodarone therapy.  At this time, he is completely asymptomatic with his atrial flutter.  As he has just completed a 3 month healing phase, I think that it is reasonable to try cardioversion again.  If he develops further asymptomatic atrial arrhythmias afterwards, then I would recommend rate control longterm.  IF he has symptomatic atrial arrhythmias then we could consider repeat catheter ablation.  He is chronically anticoagulated with coumadin. Risks, benefits, alternatives to cardioversion were discussed at length with patient who wishes to proceed. We will continue amiodarone in the short term, though I plan to discontinue this medicine over the next few months. He will return for further assessment in 4 weeks post cardioversion.  Orders: EKG w/ Interpretation (93000) TLB-CBC Platelet - w/Differential (85025-CBCD) TLB-BMP (Basic Metabolic Panel-BMET) (80048-METABOL) TLB-PT (Protime) (85610-PTP) TLB-PTT (85730-PTTL)

## 2010-02-04 NOTE — Progress Notes (Signed)
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  The pt was also diaphoretic. The pt did not have a complete LOC, but came really close.  The pt said he thought maybe a migraine was coming on but he has not had any migraine symptoms.  The pt did go to the bathroom and sit in the floor with his head between his legs.  The pt's episode lasted 10 minutes. The pt denies CP, palpitations or SOB with this episode.  I will discuss this pt with Dr Graciela Husbands.  I will call the pt back at  [phone]. Follow-up by: Julieta Gutting, RN, BSN,  April 24, 2009 1:30 PM  Additional Follow-up for Phone Call Additional follow up Details #1::        Per Dr Graciela Husbands the pt needs a Life Watch monitor to evaluate for afib and pre-syncope. I spoke with the pt's daughter (pt was asleep) and made her aware that our office will call him ASAP to arrange a monitor.  Order placed and sent to [facility]. Additional Follow-up by: Julieta Gutting, RN, BSN,  April 24, 2009 2:32 PM

## 2010-02-04 NOTE — Assessment & Plan Note (Signed)
Summary: eph/post ablation/.amber   Visit Type:  Post-hospital Referring Provider:  Viann Fish, MD Primary Provider:  [name], MD  CC:  No complaints.  History of Present Illness: Primary Electrophysiologist:  Dr. Hillis Range   Dean Pratt is a 75 yo male with a history of paroxysmal atrial fibrillation as well as atrial flutter who underwent CTI ablation in April 2011.  He had recurrent flutter and underwent cardioversion in July 2011.  He had recurrent atrial fibrillation and was brought back for redo atrial fibrillation ablation 10/16/09.  He tolerated the procedure well and returns to the office for followup today.  The patient denies chest pain, dyspnea, orthopnea, PND, pedal edema, palpitations, lightheadedness, near syncope or syncope.  His coumadin is followed at Dr. York Spaniel office and his INR was 2.7 this week.  He just had several lesions on his forehead and chest treated by his dermatologist (?actinic keratoses).  These are healing well.  Current Medications (verified): 1)  Warfarin Sodium 3 Mg Tabs (Warfarin Sodium) .... Use As Directed By Anticoagualtion Clinic 2)  Allopurinol 100 Mg Tabs (Allopurinol) .Marland Kitchen.. 1 Tablet By Mouth At Bedtime 3)  Crestor 10 Mg Tabs (Rosuvastatin Calcium) .Marland Kitchen.. 1 Tablet By Mouth Qd 4)  Aspirin 81 Mg Tbec (Aspirin) .Marland Kitchen.. 1 Tablet By Mouth Once Daily 5)  Lorazepam 0.5 Mg Tabs (Lorazepam) .Marland Kitchen.. 1 Tablet By Mouth 1-2 Times Daily As Needed 6)  Vitamin C Cr 1000 Mg Cr-Tabs (Ascorbic Acid) .... 2 Tablets By Mouth Once Daily 7)  Tiazac 240 Mg Xr24h-Cap (Diltiazem Hcl Er Beads) .Marland Kitchen.. 1 Capsule By Mouth Once Daily 8)  Indomethacin 25 Mg Caps (Indomethacin) .... As Needed For Gout 9)  Pantoprazole Sodium 40 Mg Tbec (Pantoprazole Sodium) .... Take One Tablet By Mouth Once Daily. 10)  Hydrocodone-Acetaminophen 5-500 Mg Tabs (Hydrocodone-Acetaminophen) .... As Needed 11)  Stool Softener 100 Mg Caps (Docusate Sodium) .... Take 1 Capsule By Mouth Once A  Day  Allergies (verified): No Known Drug Allergies  Past History:  Past Medical History: Paroxysmal atrial fibrillation Atrial flutter s/p afib and atrial flutter ablation 04/09/09   a.  DCCV 07/2009   b   Redo Ablation 10/16/2009 h/o ischemic colitis (per pt) HTN HL Migraines Gout Rosacea Basal Cell CA removed h/o tuberculosis constipation  Vital Signs:  Patient profile:   75 year old male Height:      70 inches Weight:      165.50 pounds BMI:     23.83 Pulse rate:   59 / minute Pulse rhythm:   regular Resp:     18 per minute BP sitting:   140 / 70  (left arm) Cuff size:   large  Vitals Entered By: Vikki Ports (November 22, 2009 8:35 AM)  Physical Exam  General:  Well nourished, well developed, in no acute distress HEENT: normal Neck: no JVD Cardiac:  normal S1, S2; RRR; no murmur Lungs:  clear to auscultation bilaterally, no wheezing, rhonchi or rales Abd: soft, nontender, no hepatomegaly Ext: no edema; bilat. groin sites without hematoma or bruit Skin: scattered erythematous lesions on forehead Neuro:  CNs 2-12 intact, no focal abnormalities noted    EKG  Procedure date:  11/22/2009  Findings:      Sinus bradycardia Heart rate 59 Normal axis Poor R-wave progression Nonspecific ST-T wave changes  Impression & Recommendations:  Problem # 1:  ATRIAL FIBRILLATION, HX OF (ICD-V12.59)  He is maintaining NSR and denies symptoms of palpitations. The patient was also seen by Dr. Johney Frame today.  No changes will be made in his therapy today. He will be brought back in 8 weeks for follow up.  Orders: EKG w/ Interpretation (93000)  Problem # 2:  HYPERTENSION NEC (ICD-997.91) Prior BPs optimal. Continue current management.  Patient Instructions: 1)  Your physician recommends that you schedule a follow-up appointment in: 8 weeks with Dr. Johney Frame. 2)  Your physician recommends that you continue on your current medications as directed. Please refer to the  Current Medication list given to you today.  Appended Document: eph/post ablation/.amber [name] is maintaining sinus rhythm at this time off of antiarrhythmic medicines.  He remains within the early months post ablation.  He has had no postprocedure complications.  I have recommended that he continue coumadin and return in 8 weeks. He will continue to follow with Dr Donnie Aho as scheduled.

## 2010-02-04 NOTE — Letter (Signed)
Summary: Colonoscopy Letter  [facility]  [address] [place], [place] [address]   Phone: [phone]  Fax: [phone]      March 15, 2009 MRN: [id number]   Dean Pratt [address] [place], [place]  [address]   Dear Dean Pratt,   According to your medical record, it is time for you to schedule a Colonoscopy. The American Cancer Society recommends this procedure as a method to detect early colon cancer. Patients with a family history of colon cancer, or a personal history of colon polyps or inflammatory bowel disease are at increased risk.  This letter has been generated based on the recommendations made at the time of your procedure. If you feel that in your particular situation this may no longer apply, please contact our office.  Please call our office at [phone] to schedule this appointment or to update your records at your earliest convenience.  Thank you for cooperating with [place] to provide you with the very best care possible.   Sincerely,  Wilhemina Bonito. Marina Goodell, M.D.  [facility] [phone]

## 2010-02-04 NOTE — Assessment & Plan Note (Signed)
Summary: 6wk f/u @ 9am/sl   Visit Type:  Follow-up Referring Provider:  Viann Fish, MD Primary Provider:  [name], MD   History of Present Illness: The patient presents today for routine electrophysiology followup. He reports doing well since his ablation.  He denies post procedural complications.  He has had palitations/ ERAF.  The patient also had headache with auras, diaphoresis, and presyncope x 10 minutes several weeks ago.  He did have syncope with this event.   He reports having prior migrains and feels strongly that this event was a migraine.  He subsequently had an event monitor placed which revealed afib with intermittent RVR, but no brady events, pauses, or ventricular arrhythmias.  The patient denies symptoms of chest pain, shortness of breath, orthopnea, PND, lower extremity edema, dizziness, presyncope, syncope,or neurologic sequela. The patient is tolerating medications without difficulties and is otherwise without complaint today.   Current Medications (verified): 1)  Flecainide Acetate 100 Mg Tabs (Flecainide Acetate) .Marland Kitchen.. 1 Tablet By Mouth Two Times A Day 2)  Warfarin Sodium 3 Mg Tabs (Warfarin Sodium) .... Use As Directed By Anticoagualtion Clinic 3)  Allopurinol 100 Mg Tabs (Allopurinol) .Marland Kitchen.. 1 Tablet By Mouth At Bedtime 4)  Crestor 10 Mg Tabs (Rosuvastatin Calcium) .Marland Kitchen.. 1 Tablet By Mouth Qd 5)  Aspirin 81 Mg Tbec (Aspirin) .Marland Kitchen.. 1 Tablet By Mouth Once Daily 6)  Lorazepam 0.5 Mg Tabs (Lorazepam) .Marland Kitchen.. 1 Tablet By Mouth 1-2 Times Daily As Needed 7)  Vitamin C Cr 1000 Mg Cr-Tabs (Ascorbic Acid) .... 2 Tablets By Mouth Once Daily 8)  Tiazac 240 Mg Xr24h-Cap (Diltiazem Hcl Er Beads) .Marland Kitchen.. 1 Capsule By Mouth Once Daily 9)  Indomethacin 25 Mg Caps (Indomethacin) .... As Needed For Gout 10)  Pantoprazole Sodium 40 Mg Tbec (Pantoprazole Sodium) .... Take One Tablet By Mouth Once Daily. 11)  Hydrocodone-Acetaminophen 5-500 Mg Tabs (Hydrocodone-Acetaminophen) .... As  Needed  Allergies (verified): No Known Drug Allergies  Past History:  Past Medical History: Reviewed history from 04/17/2009 and no changes required. Paroxysmal atrial fibrillation Atrial flutter s/p afib and atrial flutter ablation 04/09/09 h/o ischemic colitis (per pt) HTN HL Migraines Gout Rosacea Basal Cell CA removed h/o tuberculosis constipation  Past Surgical History: Reviewed history from 04/17/2009 and no changes required. Appendectomy inguinal herniorrhaphy Tonsillectomy Vasectomy s/p afib and atrial flutter ablation 04/09/09  Vital Signs:  Patient profile:   75 year old male Height:      70 inches Weight:      166 pounds BMI:     23.90 Pulse rate:   71 / minute BP sitting:   130 / 82  (left arm)  Vitals Entered By: Laurance Flatten CMA (May 29, 2009 8:55 AM)  Physical Exam  General:  Well developed, well nourished, in no acute distress. Head:  normocephalic and atraumatic Eyes:  PERRLA/EOM intact; conjunctiva and lids normal. Mouth:  Teeth, gums and palate normal. Oral mucosa normal. Neck:  Neck supple, no JVD. No masses, thyromegaly or abnormal cervical nodes. Lungs:  Clear bilaterally to auscultation and percussion. Heart:  iRRR, no m/r/g Abdomen:  Bowel sounds positive; abdomen soft and non-tender without masses, organomegaly, or hernias noted. No hepatosplenomegaly. Msk:  Back normal, normal gait. Muscle strength and tone normal. Pulses:  pulses normal in all 4 extremities Extremities:  No clubbing or cyanosis. Neurologic:  Alert and oriented x 3.   EKG  Procedure date:  05/29/2009  Findings:      coarse afib V rates 70, IVCD  Impression & Recommendations:  Problem # 1:  ATRIAL FIBRILLATION, HX OF (ICD-V12.59) Pt has had ERAF after ablation.  I would like to try to maintain sinus as much as possible to promote positive atrial remodeling after ablation.  I have stopped flecainide today.  After a 7 day washout, we will start Amiodarone 200mg  two  times a day.  We will obtain TFTs and LFTs today.  I do not plan to maintain amiodarone longterm.  Continue coumadin (goal INR 2-3)  Problem # 2:  HYPERTENSION NEC (ICD-997.91) stable no changes today  Other Orders: EKG w/ Interpretation (93000) TLB-Hepatic/Liver Function Pnl (80076-HEPATIC) TLB-TSH (Thyroid Stimulating Hormone) (16109-UEA)  Patient Instructions: 1)  Your physician recommends that you schedule a follow-up appointment in: 6 weeks. 2)  Your physician has recommended you make the following change in your medication: Stop Flecainide. After seven days of stoping Flecianide, start Amiodarone 200mg  two times a day. 3)  Your physician recommends that you return for lab work today Prescriptions: AMIODARONE HCL 200 MG TABS (AMIODARONE HCL) Take one tablet by mouth twice a day  #60 x 3   Entered by:   Laurance Flatten CMA   Authorized by:   Hillis Range, MD   Signed by:   Laurance Flatten CMA on 05/29/2009   Method used:   Electronically to        [facility] [phone]* (retail)       [address]       [place], [place]  [address]       Ph: [phone] or [phone]       Fax: [phone]   RxID:   [id number]

## 2010-02-04 NOTE — Assessment & Plan Note (Signed)
Summary: EPH./ APPT IS 9:00/ PER JA TO WORK IN/ GD   Visit Type:  Follow-up Referring Provider:  Viann Fish, MD Primary Provider:  [name], MD   History of Present Illness: The patient presents today for routine electrophysiology followup. He reports doing very well since his afib ablation.  He reports several short episodes of palpitations (10 minutes) over the past few days, but no prolonged arrhythmia symptoms. The patient denies symptoms of palpitations, chest pain, shortness of breath, orthopnea, PND, lower extremity edema, dizziness, presyncope, syncope, or neurologic sequela. The patient is tolerating medications without difficulties and is otherwise without complaint today.   Current Medications (verified): 1)  Flecainide Acetate 100 Mg Tabs (Flecainide Acetate) .Marland Kitchen.. 1 Tablet By Mouth Two Times A Day 2)  Warfarin Sodium 3 Mg Tabs (Warfarin Sodium) .... Use As Directed By Anticoagualtion Clinic 3)  Allopurinol 100 Mg Tabs (Allopurinol) .Marland Kitchen.. 1 Tablet By Mouth At Bedtime 4)  Crestor 10 Mg Tabs (Rosuvastatin Calcium) .Marland Kitchen.. 1 Tablet By Mouth Qd 5)  Micardis 40 Mg Tabs (Telmisartan) .Marland Kitchen.. 1 Tablet By Mouth Once Daily 6)  Aspirin 81 Mg Tbec (Aspirin) .Marland Kitchen.. 1 Tablet By Mouth Once Daily 7)  Lorazepam 0.5 Mg Tabs (Lorazepam) .Marland Kitchen.. 1 Tablet By Mouth 1-2 Times Daily As Needed 8)  Vitamin C Cr 1000 Mg Cr-Tabs (Ascorbic Acid) .... 2 Tablets By Mouth Once Daily 9)  Tiazac 240 Mg Xr24h-Cap (Diltiazem Hcl Er Beads) .Marland Kitchen.. 1 Capsule By Mouth Once Daily 10)  Indomethacin 25 Mg Caps (Indomethacin) .... As Needed For Gout 11)  Pantoprazole Sodium 40 Mg Tbec (Pantoprazole Sodium) .... Take One Tablet By Mouth Once Daily. 12)  Hydrocodone-Acetaminophen 5-500 Mg Tabs (Hydrocodone-Acetaminophen) .... As Needed  Allergies (verified): No Known Drug Allergies  Past History:  Past Medical History: Paroxysmal atrial fibrillation Atrial flutter s/p afib and atrial flutter ablation 04/09/09 h/o ischemic  colitis (per pt) HTN HL Migraines Gout Rosacea Basal Cell CA removed h/o tuberculosis constipation  Past Surgical History: Appendectomy inguinal herniorrhaphy Tonsillectomy Vasectomy s/p afib and atrial flutter ablation 04/09/09  Social History: Reviewed history from 02/27/2009 and no changes required. Pt lives in [place].    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  He is  married and lives at home with his wife.  He used to smoke but quit  smoking many years ago.  Does get regular exercise.  No alcohol use.   Vital Signs:  Patient profile:   75 year old male Height:      70 inches Weight:      164 pounds BMI:     23.62 Pulse rate:   61 / minute BP sitting:   100 / 60  (left arm)  Vitals Entered By: Laurance Flatten CMA (April 17, 2009 9:05 AM)  Physical Exam  General:  Well developed, well nourished, in no acute distress. Head:  normocephalic and atraumatic Eyes:  PERRLA/EOM intact; conjunctiva and lids normal. Mouth:  Teeth, gums and palate normal. Oral mucosa normal. Neck:  Neck supple, no JVD. No masses, thyromegaly or abnormal cervical nodes. Lungs:  Clear bilaterally to auscultation and percussion. Heart:  RRR, no m/r/g Abdomen:  Bowel sounds positive; abdomen soft and non-tender without masses, organomegaly, or hernias noted. No hepatosplenomegaly. Msk:  Back normal, normal gait. Muscle strength and tone normal. Pulses:  pulses normal in all 4 extremities Extremities:  No clubbing or cyanosis. Neurologic:  Alert and oriented x 3. Skin:  Intact without lesions or  rashes. Cervical Nodes:  no significant adenopathy Psych:  Normal affect.   EKG  Procedure date:  04/17/2009  Findings:      sinus bradycardia 53 bpm, PR 246, IVCD  Impression & Recommendations:  Problem # 1:  ATRIAL FIBRILLATION, HX OF (ICD-V12.59) presently in sinus s/p ablation. He has had short episodes of ERAF.  We will continue flecainide 100mg  two times a  day and cardizem. Continue coumadin. I will see him back in 5-6 weeks and consider stopping flecainide if his ERAF has resolved.  Other Orders: EKG w/ Interpretation (93000)  Patient Instructions: 1)  Your physician recommends that you schedule a follow-up appointment in: 5-6 weeks with Dr Johney Frame

## 2010-02-04 NOTE — Progress Notes (Signed)
Summary: pt needs something for pain  Phone Note Call from Patient Call back at Home Phone [phone]   Caller: Patient Reason for Call: Talk to Nurse, Talk to Doctor Summary of Call: pt had ablation yesterday and went home today and needs something called in for pain [facility] [address] Initial call taken by: Omer Jack,  October 16, 2009 3:05 PM  Follow-up for Phone Call        Spoke with pt. Patient states he had an ablation yesterday. He was D/C from  the hospital today. Pt. states he has pain in both groin entrance site. Pain score " 1 to 2". Also pt. states he did not sleep last night because the nurses were caming in his room often. Pt. states he had taken tylenol for pain before, but he has not taken any today. A advice pt. to take 2 tylenol now to see if it works. It pain does not get better or  gets worse, to call the office back. Pt. verbalized understanding.  Follow-up by: Ollen [place], RN, BSN,  October 16, 2009 3:31 PM

## 2010-02-04 NOTE — Assessment & Plan Note (Signed)
Summary: Dean Pratt is 1:45/ gd   Visit Type:  Follow-up Referring Reynold Mantell:  Viann Fish, MD Primary Dean Pratt:  [name], MD   History of Present Illness:  The patient presents today for routine electrophysiology followup. He reports doing reasonably well since last being seen in our clinic.  He continues to have symptomatic atrial arrhythmias.  He reports fatigue and decreased exercise tolerance.   He reports having episodic dizziness x 1 two weeks ago, lasting 10 minutes which resolved with putting his head between his legs.   The patient denies symptoms of chest pain, shortness of breath, orthopnea, PND, lower extremity edema, syncope, or neurologic sequela. The patient is tolerating medications without difficulties and is otherwise without complaint today.   Current Medications (verified): 1)  Warfarin Sodium 3 Mg Tabs (Warfarin Sodium) .... Use As Directed By Anticoagualtion Clinic 2)  Allopurinol 100 Mg Tabs (Allopurinol) .Marland Kitchen.. 1 Tablet By Mouth At Bedtime 3)  Crestor 10 Mg Tabs (Rosuvastatin Calcium) .Marland Kitchen.. 1 Tablet By Mouth Qd 4)  Aspirin 81 Mg Tbec (Aspirin) .Marland Kitchen.. 1 Tablet By Mouth Once Daily 5)  Lorazepam 0.5 Mg Tabs (Lorazepam) .Marland Kitchen.. 1 Tablet By Mouth 1-2 Times Daily As Needed 6)  Vitamin C Cr 1000 Mg Cr-Tabs (Ascorbic Acid) .... 2 Tablets By Mouth Once Daily 7)  Tiazac 240 Mg Xr24h-Cap (Diltiazem Hcl Er Beads) .Marland Kitchen.. 1 Capsule By Mouth Once Daily 8)  Indomethacin 25 Mg Caps (Indomethacin) .... As Needed For Gout 9)  Pantoprazole Sodium 40 Mg Tbec (Pantoprazole Sodium) .... Take One Tablet By Mouth Once Daily. 10)  Hydrocodone-Acetaminophen 5-500 Mg Tabs (Hydrocodone-Acetaminophen) .... As Needed  Allergies (verified): No Known Drug Allergies  Past History:  Past Medical History: Reviewed history from 04/17/2009 and no changes required. Paroxysmal atrial fibrillation Atrial flutter s/p afib and atrial flutter ablation 04/09/09 h/o ischemic colitis (per  pt) HTN HL Migraines Gout Rosacea Basal Cell CA removed h/o tuberculosis constipation  Past Surgical History: Reviewed history from 04/17/2009 and no changes required. Appendectomy inguinal herniorrhaphy Tonsillectomy Vasectomy s/p afib and atrial flutter ablation 04/09/09  Family History: Reviewed history from 02/27/2009 and no changes required. Family History of Breast Cancer: Sister No FH of Colon Cancer: Family History of Liver Cancer: Father Family History of Prostate Cancer: Father   Father died of prostate cancer and coronary artery   disease at age 72.  Mother died of COPD at age 69.  Brother and a sister are living.   Social History: Reviewed history from 02/27/2009 and no changes required. Pt lives in [place].    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  He is  married and lives at home with his wife.  He used to smoke but quit  smoking many years ago.  Does get regular exercise.  No alcohol use.   Review of Systems       All systems are reviewed and negative except as listed in the HPI.   Vital Signs:  Patient profile:   75 year old male Height:      70 inches Weight:      168 pounds BMI:     24.19 Pulse rate:   68 / minute BP sitting:   120 / 70  (left arm)  Vitals Entered By: Laurance Flatten CMA (August 26, 2009 2:14 PM)  Physical Exam  General:  Well developed, well nourished, in no acute distress. Head:  normocephalic and atraumatic Eyes:  PERRLA/EOM intact; conjunctiva and lids normal.  Mouth:  Teeth, gums and palate normal. Oral mucosa normal. Neck:  Neck supple, no JVD. No masses, thyromegaly or abnormal cervical nodes. Lungs:  Clear bilaterally to auscultation and percussion. Heart:  iRRR, no m/r/g Abdomen:  Bowel sounds positive; abdomen soft and non-tender without masses, organomegaly, or hernias noted. No hepatosplenomegaly. Msk:  Back normal, normal gait. Muscle strength and tone normal. Pulses:  pulses  normal in all 4 extremities Extremities:  No clubbing or cyanosis. Neurologic:  Alert and oriented x 3.   EKG  Procedure date:  08/26/2009  Findings:      afib,  V rate 70 bpm, otherwise normal ekg  Impression & Recommendations:  Problem # 1:  ATRIAL FIBRILLATION, HX OF (ICD-V12.59) The patient has symptomatic persistent atrial fibrillation and atypical atrial flutter with recurrence s/p afib ablation.  During his prior ablation, he underwent CTI ablation but was observed to have multiple atypical atrial flutter circuits too unstable for mapping.  He recently has failed medical therapy with amiodarone.  Therapeutic strategies for afib including medicine and ablation were discussed in detail with the patient today. Risk, benefits, and alternatives to EP study and radiofrequency ablation for afib were also discussed in detail today. These risks include but are not limited to stroke, bleeding, vascular damage, tamponade, perforation, damage to the esophagus, lungs, and other structures, pulmonary vein stenosis, worsening renal function, and death. The patient understands these risk and wishes to proceed.  We will obtain a cardiac CT prior to ablation to evaluate for pulmonary vein stenosis and also better define his pulmonary venous anatomy. We will then plan for repeat ablation at the next available time.  Problem # 2:  HYPERTENSION NEC (ICD-997.91) stable  Other Orders: Cardiac CTA (Cardiac CTA)  Patient Instructions: 1)  Repeat ablation on 10/15/09 at 7:30am  Check in at Short Stay at 5:30am 2)  Will arrange a TEE on 10/14/09 and call with time 3)  Will need a Cardiac CTA week prior to ablation Week on 10/07/09 4)  Your physician recommends that you return for lab work on 10/08/09 for pre-procedure labs

## 2010-02-04 NOTE — Progress Notes (Signed)
Summary: returning call back  Phone Note Call from Patient Call back at Home Phone [phone] Call back at Work Phone [phone] Call back at call after/ before  1 p.m.   Caller: Patient Reason for Call: Talk to Nurse Details for Reason: returning your call  Initial call taken by: Lorne Skeens,  August 27, 2009 9:33 AM  Follow-up for Phone Call        Patient is due to have a cardiac ct the week of Oct 3rd. I will call the patient back to set up the appt. Pt is aware was informed by Dr. Jenel Lucks nurse. Follow-up by: Merita Norton Lloyd-Fate,  August 30, 2009 9:57 AM

## 2010-02-04 NOTE — Procedures (Signed)
Summary: Holter and Event  Holter and Event   Imported By: Erle Crocker 05/31/2009 08:52:33  _____________________________________________________________________  External Attachment:    Type:   Image     Comment:   External Document

## 2010-02-04 NOTE — Progress Notes (Signed)
Summary: sleep study  Phone Note From Other Clinic Call back at [phone]   Caller: Catti/Dr Donnie Aho Summary of Call: wants to know if Dr Johney Frame is going to order sleep study? Initial call taken by: Migdalia Dk,  March 05, 2009 8:59 AM  Follow-up for Phone Call        spoke with Cappi.  Dr Amedeo Plenty note says to have  pt discuss OSA with Dr Donnie Aho and have them make the referral for sleep study. Dennis Bast, RN, BSN  March 05, 2009 9:17 AM

## 2010-02-04 NOTE — Letter (Signed)
Summary: ELectrophysiology/Ablation Procedure Instructions  [facility], Main Office  [address]   [place], [place] [address]   Phone: [phone]  Fax: [phone]     Ablation Procedure Instructions    You are scheduled for a(n) AF Ablation  on March 21, 2009 at 0730 with Dr. Johney Frame.  1.  Please come to the Short Stay Center at [facility] at 0530 on the day of your procedure.  2.  Come prepared to stay overnight.   Please bring your insurance cards and a list of your medications.  3.  Come to the [place] office on 03/14/09 for lab work.  The lab at [facility] is open from 8:30 AM to 1:30 PM and 2:30 PM to 5:00 PM.  The lab at [facility] is open from 7:30 AM to 5:30 PM.  You do not have to be fasting.  4.  Do not have anything to eat or drink after midnight the night before your procedure.  5.  Your medications may be taken with a small amount of water the morning of your procedure.  6.  Educational material received:  _____ EP   __x___ Ablation   * Occasionally, EP studies and ablations can become lengthy.  Please make your family aware of this before your procedure starts.  Average time ranges from 2-8 hours for EP studies/ablations.  Your physician will locate your family after the procedure with the results.  * If you have any questions after you get home, please call the office at [phone].

## 2010-02-04 NOTE — Progress Notes (Signed)
Summary: holter monitor  Phone Note Call from Patient Call back at Work Phone [phone]   Caller: Patient Reason for Call: Talk to Nurse Summary of Call: calling to check on his holter monitor Initial call taken by: Migdalia Dk,  April 29, 2009 9:40 AM  Follow-up for Phone Call        spoke with Baxter Hire at [facility] no order in system that see can see.  She will send me to [employer] to see in they can see and order.  Spoke with Shanda Bumps in [place]. she can't see an order either.  Weill research more on this end.  Pt still needs to wear the monitor.  Will let him know when we have it figured out. Dennis Bast, RN, BSN  April 29, 2009 11:13 AM  Additional Follow-up for Phone Call Additional follow up Details #1::        spoke with Mr Vanleer and let him know the hold up was the pre-cert for his insurance.  Will have the monitor sent to him today. Dennis Bast, RN, BSN  April 30, 2009 9:34 AM

## 2010-02-04 NOTE — Progress Notes (Signed)
Summary: CALLING REGARDING TEE AND ABLATION  Phone Note Call from Patient Call back at Home Phone [phone] Call back at Work Phone [phone] Call back at [phone] CELL   Caller: Patient Summary of Call: CALLING REGARDING TEE AND ABLATION Initial call taken by: Judie Grieve,  March 28, 2009 10:58 AM  Follow-up for Phone Call        Spoke with pt. Patient states he was scheduled for TEE  March 16th  and ablation on March  17TH  tests were canceled due to pt. been sick with the flu. Pt. would like to know if test will be re-schedule for April 4th. Pt. states needs to know in advance for his work. I let pt. know Dr. Johney Frame and his nurse will be in office tomorrow 03/01/09. I will send messages to their desktop. Ollen Gross, RN, BSN  March 28, 2009 11:46 AM ablation scheduled for 04/09/09 and TEE scheduled for 04/08/09 Dennis Bast, RN, BSN  March 29, 2009 3:01 PM

## 2010-02-04 NOTE — Letter (Signed)
Summary: [facility] Note  [facility] Office Note   Imported By: Roderic Ovens 06/13/2009 13:51:09  _____________________________________________________________________  External Attachment:    Type:   Image     Comment:   External Document

## 2010-02-04 NOTE — Letter (Signed)
Summary: ELectrophysiology/Ablation Procedure Instructions  [facility], Main Office  [address]   [place], [place] [address]   Phone: [phone]  Fax: [phone]     Electrophysiology/Ablation Procedure Instructions    You are scheduled for a(n) a fib ablation on 4/5/11at 7:30am with Dr. Johney Frame.  1.  Please come to the Short Stay Center at [facility] at 5:30am on the day of your procedure.  2.  Come prepared to stay overnight.   Please bring your insurance cards and a list of your medications.  3.  Come to the [place] office on 04/03/09 at 8:30am for lab work.  The lab at [facility] is open from 8:30 AM to 1:30 PM and 2:30 PM to 5:00 PM.    You do not have to be fasting.  4.  Do not have anything to eat or drink after midnight the night before your procedure.  5.  All of your remaining medications may be taken with a small amount of water.  6.  Educational material received:     _____ Ablation   * Occasionally, EP studies and ablations can become lengthy.  Please make your family aware of this before your procedure starts.  Average time ranges from 2-8 hours for EP studies/ablations.  Your physician will locate your family after the procedure with the results.  * If you have any questions after you get home, please call the office at [phone]. TEE  04/08/09---at 12:00  Check in at Short Stay at 10:30am  Clear liquids until 6:00am  Nothing to eat or drink after 6am

## 2010-02-04 NOTE — Letter (Signed)
Summary: Claim Status Notification   Claim Status Notification   Imported By: Roderic Ovens 07/02/2009 12:26:10  _____________________________________________________________________  External Attachment:    Type:   Image     Comment:   External Document

## 2010-02-04 NOTE — Letter (Signed)
Summary: Dr Lacretia Nicks. Viann Fish Jr's Office Note  Dr Lacretia Nicks. Viann Fish Jr's Office Note   Imported By: Roderic Ovens 07/12/2009 15:40:17  _____________________________________________________________________  External Attachment:    Type:   Image     Comment:   External Document

## 2010-02-04 NOTE — Progress Notes (Signed)
Summary: pt was waiting on some information  Phone Note Call from Patient Call back at Home Phone [phone]   Caller: Patient Reason for Call: Talk to Nurse, Talk to Doctor Summary of Call: pt was suppose to hear something from a nurse today but he has not heard anything so he was calling to see what he needed to do Initial call taken by: Omer Jack,  April 25, 2009 1:15 PM  Follow-up for Phone Call        CALL PLACED TO [employer]  WILL OVER NIGHT MONITOR TO PT . PT AWARE. Follow-up by: Scherrie Bateman, LPN,  April 25, 2009 1:43 PM

## 2010-02-06 NOTE — Procedures (Addendum)
Summary: Colonoscopy  Patient: Dean Pratt Note: All result statuses are Final unless otherwise noted.  Tests: (1) Colonoscopy (COL)   COL Colonoscopy           DONE     [facility]     [address]     [place], [place]  [address]           COLONOSCOPY PROCEDURE REPORT           PATIENT:  Dean Pratt, Dean Pratt  MR#:  [id number]     BIRTHDATE:  [birth year], 74 yrs. old  GENDER:  male     ENDOSCOPIST:  Wilhemina Bonito. Eda Keys, MD     REF. BY:  Surveillance Program Recall,     PROCEDURE DATE:  01/28/2010     PROCEDURE:  Colonoscopy with snare polypectomy x 2     ASA CLASS:  Class III     INDICATIONS:  history of pre-cancerous (adenomatous) colon polyps,     surveillance and high-risk screening ; last colonoscopy 04-2008 >     20 adenomas     MEDICATIONS:   Fentanyl 100 mcg IV, Versed 10 mg IV           DESCRIPTION OF PROCEDURE:   After the risks benefits and     alternatives of the procedure were thoroughly explained, informed     consent was obtained.  Digital rectal exam was performed and     revealed no abnormalities.   The LB 180AL [id number] endoscope was     introduced through the anus and advanced to the cecum, which was     identified by both the appendix and ileocecal valve, without     limitations.Time to cecum = 3:05 min.  The quality of the prep was     good, using MoviPrep.  The instrument was then slowly withdrawn     (time = 11:50 min) as the colon was fully examined.     <<PROCEDUREIMAGES>>           FINDINGS:  Two 3mm polyps were found in the descending colon.     Polyps were snared without cautery. Retrieval was successful.     snare polyp  Mild diverticulosis was found in the sigmoid colon.     Otherwise normal colonoscopy without other polyps, masses, vascular     ectasias, or inflammatory changes.   Retroflexed views in the     rectum revealed internal hemorrhoids.    The scope was then     withdrawn from the patient and the procedure completed.        COMPLICATIONS:  None     ENDOSCOPIC IMPRESSION:     1) Two polyps in the descending colon -removed     2) Mild diverticulosis in the sigmoid colon     3) Otherwise nl colonoscopy     4) Small Internal hemorrhoids           RECOMMENDATIONS:     1) Follow up colonoscopy in ONE YEAR           ______________________________     Wilhemina Bonito. Eda Keys, MD           CC:  [name], MD;  The Patient           n.     eSIGNED:   Wilhemina Bonito. Eda Keys at 01/28/2010 03:22 PM           Nino Glow, [id number]  Note: An exclamation mark (!) indicates  a result that was not dispersed into the flowsheet. Document Creation Date: 01/28/2010 3:23 PM _______________________________________________________________________  (1) Order result status: Final Collection or observation date-time: 01/28/2010 15:17 Requested date-time:  Receipt date-time:  Reported date-time:  Referring Physician:   Ordering Physician: Fransico Setters [phone]) Specimen Source:  Source: Launa Grill Order Number: [id number] Lab site:   Appended Document: Colonoscopy recall ONE year     Procedures Next Due Date:    Colonoscopy: 01/2011

## 2010-02-06 NOTE — Letter (Signed)
Summary: Anticoagulation Modification Letter  [facility]  [address] [place], [place] [address]   Phone: [phone]  Fax: [phone]    January 02, 2010  Re:    Dean Pratt DOB:    10-08-1935 MRN:    [id number]    Dear Dr. Johney Frame,   We have scheduled the above patient for an endoscopic procedure. Our records show that  he/she is on anticoagulation therapy. Please advise as to how long the patient may come off their therapy of coumadin prior to the scheduled procedure(s) on 01/28/10.   Please fax back/or route the completed form to [facility] at [phone].  Thank you for your help with this matter.  Sincerely,  Christie Nottingham CMA Duncan Dull)   Physician Recommendation:  Hold Plavix 7 days prior ________________  Hold Coumadin 5 days prior ____________  Other ______________________________     Appended Document: Anticoagulation Modification Letter Please hold coumadin for 5 days prior to the procedure and then restart immediately after the procedure.  Appended Document: Anticoagulation Modification Letter called patient and notified to hold Coumadin x 5 days and then immediately start back after procedure.

## 2010-02-06 NOTE — Assessment & Plan Note (Signed)
Summary: PER CHECK OTU/SF   Visit Type:  Follow-up Referring Provider:  Viann Fish, MD Primary Provider:  [name], MD   History of Present Illness: The patient presents today for routine electrophysiology followup. He reports doing very well since last being seen in our clinic.  He denies any symptoms of afib.  The patient denies symptoms of palpitations, chest pain, shortness of breath, orthopnea, PND, lower extremity edema, dizziness, presyncope, syncope, or neurologic sequela. The patient is tolerating medications without difficulties and is otherwise without complaint today.   Current Medications (verified): 1)  Warfarin Sodium 3 Mg Tabs (Warfarin Sodium) .... Use As Directed By Anticoagualtion Clinic 2)  Allopurinol 100 Mg Tabs (Allopurinol) .Marland Kitchen.. 1 Tablet By Mouth At Bedtime 3)  Crestor 10 Mg Tabs (Rosuvastatin Calcium) .Marland Kitchen.. 1 Tablet By Mouth Qd 4)  Aspirin 81 Mg Tbec (Aspirin) .Marland Kitchen.. 1 Tablet By Mouth Once Daily 5)  Lorazepam 0.5 Mg Tabs (Lorazepam) .Marland Kitchen.. 1 Tablet By Mouth 1-2 Times Daily As Needed 6)  Vitamin C Cr 1000 Mg Cr-Tabs (Ascorbic Acid) .... 2 Tablets By Mouth Once Daily 7)  Tiazac 240 Mg Xr24h-Cap (Diltiazem Hcl Er Beads) .Marland Kitchen.. 1 Capsule By Mouth Once Daily 8)  Indomethacin 25 Mg Caps (Indomethacin) .... As Needed For Gout 9)  Pantoprazole Sodium 40 Mg Tbec (Pantoprazole Sodium) .... Take One Tablet By Mouth Once Daily. 10)  Hydrocodone-Acetaminophen 5-500 Mg Tabs (Hydrocodone-Acetaminophen) .... As Needed 11)  Stool Softener 100 Mg Caps (Docusate Sodium) .... Take 1 Capsule By Mouth Once A Day 12)  Moviprep 100 Gm  Solr (Peg-Kcl-Nacl-Nasulf-Na Asc-C) .... As Per Prep Instructions.  Allergies (verified): No Known Drug Allergies  Past History:  Past Medical History: Reviewed history from 11/22/2009 and no changes required. Paroxysmal atrial fibrillation Atrial flutter s/p afib and atrial flutter ablation 04/09/09   a.  DCCV 07/2009   b   Redo Ablation  10/16/2009 h/o ischemic colitis (per pt) HTN HL Migraines Gout Rosacea Basal Cell CA removed h/o tuberculosis constipation  Past Surgical History: Reviewed history from 04/17/2009 and no changes required. Appendectomy inguinal herniorrhaphy Tonsillectomy Vasectomy s/p afib and atrial flutter ablation 04/09/09  Social History: Reviewed history from 02/27/2009 and no changes required. Pt lives in [place].    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  He is  married and lives at home with his wife.  He used to smoke but quit  smoking many years ago.  Does get regular exercise.  No alcohol use.   Vital Signs:  Patient profile:   75 year old male Height:      70 inches Weight:      167 pounds Pulse rate:   68 / minute Pulse rhythm:   regular BP sitting:   138 / 88  (left arm)  Vitals Entered By: Jacquelin Hawking, CMA (January 22, 2010 12:44 PM)  Physical Exam  General:  Well developed, well nourished, no acute distress. Head:  Normocephalic and atraumatic. Eyes:  PERRLA, no icterus. Mouth:  No deformity or lesions Neck:  Supple; no masses or thyromegaly. Lungs:  Clear throughout to auscultation. Heart:  Regular rate and rhythm; no murmurs, rubs,  or bruits. Abdomen:  Soft, nontender and nondistended. No masses, hepatosplenomegaly or hernias noted. Normal bowel sounds. Msk:  Back normal, normal gait. Muscle strength and tone normal. Extremities:  No clubbing or cyanosis. Neurologic:  Alert and oriented x 3.   EKG  Procedure date:  01/22/2010  Findings:  sinus rhythm 62 bpm, PR 194, QRS 106, Qtc 422, otherwise normal ekg  Impression & Recommendations:  Problem # 1:  ATRIAL FIBRILLATION (ICD-427.31) doing very well s/p ablation He is maintaining sinus rhythm off AAD no changes  Problem # 2:  HYPERTENSION NEC (ICD-997.91) stable  Other Orders: EKG w/ Interpretation (93000)  Patient Instructions: 1)  Your physician wants  you to follow-up in:  3 months with Dr Jacquiline Doe will receive a reminder letter in the mail two months in advance. If you don't receive a letter, please call our office to schedule the follow-up appointment.

## 2010-02-06 NOTE — Letter (Signed)
Summary: [facility] Instructions  [facility]  [address] [place], [place] [address]   Phone: [phone]  Fax: [phone]       Dean Pratt    12-18-1935    MRN: [id number]        Procedure Day /Date: Tuesday January 24th, 2012     Arrival Time: 1:30pm     Procedure Time: 2:30pm     Location of Procedure:                    _x _  [facility] (4th Floor)                        PREPARATION FOR COLONOSCOPY WITH MOVIPREP   Starting 5 days prior to your procedure 01/23/10 do not eat nuts, seeds, popcorn, corn, beans, peas,  salads, or any raw vegetables.  Do not take any fiber supplements (e.g. Metamucil, Citrucel, and Benefiber).  THE DAY BEFORE YOUR PROCEDURE         DATE: 01/27/10  DAY: Monday  1.  Drink clear liquids the entire day-NO SOLID FOOD  2.  Do not drink anything colored red or purple.  Avoid juices with pulp.  No orange juice.  3.  Drink at least 64 oz. (8 glasses) of fluid/clear liquids during the day to prevent dehydration and help the prep work efficiently.  CLEAR LIQUIDS INCLUDE: Water Jello Ice Popsicles Tea (sugar ok, no milk/cream) Powdered fruit flavored drinks Coffee (sugar ok, no milk/cream) Gatorade Juice: apple, white grape, white cranberry  Lemonade Clear bullion, consomm, broth Carbonated beverages (any kind) Strained chicken noodle soup Hard Candy                             4.  In the morning, mix first dose of MoviPrep solution:    Empty 1 Pouch A and 1 Pouch B into the disposable container    Add lukewarm drinking water to the top line of the container. Mix to dissolve    Refrigerate (mixed solution should be used within 24 hrs)  5.  Begin drinking the prep at 5:00 p.m. The MoviPrep container is divided by 4 marks.   Every 15 minutes drink the solution down to the next mark (approximately 8 oz) until the full liter is complete.   6.  Follow completed prep with 16 oz of clear liquid of your choice  (Nothing red or purple).  Continue to drink clear liquids until bedtime.  7.  Before going to bed, mix second dose of MoviPrep solution:    Empty 1 Pouch A and 1 Pouch B into the disposable container    Add lukewarm drinking water to the top line of the container. Mix to dissolve    Refrigerate  THE DAY OF YOUR PROCEDURE      DATE: 01/28/10 EPP:IRJJOAC  Beginning at 9:30 a.m. (5 hours before procedure):         1. Every 15 minutes, drink the solution down to the next mark (approx 8 oz) until the full liter is complete.  2. Follow completed prep with 16 oz. of clear liquid of your choice.    3. You may drink clear liquids until 12:30pm (2 HOURS BEFORE PROCEDURE).   MEDICATION INSTRUCTIONS  Unless otherwise instructed, you should take regular prescription medications with a small sip of water   as early as possible the morning of your procedure.  Stop taking Coumadin on  _ _  (5 days before procedure).  Additional medication instructions: You will be contaced by our office prior to your procedure for directions on holding your Coumadin/Warfarin.  If you do not hear from our office 1 week prior to your scheduled procedure, please call [phone] to discuss.          OTHER INSTRUCTIONS  You will need a responsible adult at least 73 years of age to accompany you and drive you home.   This person must remain in the waiting room during your procedure.  Wear loose fitting clothing that is easily removed.  Leave jewelry and other valuables at home.  However, you may wish to bring a book to read or  an iPod/MP3 player to listen to music as you wait for your procedure to start.  Remove all body piercing jewelry and leave at home.  Total time from sign-in until discharge is approximately 2-3 hours.  You should go home directly after your procedure and rest.  You can resume normal activities the  day after your procedure.  The day of your procedure you should not:    Drive   Make legal decisions   Operate machinery   Drink alcohol   Return to work  You will receive specific instructions about eating, activities and medications before you leave.    The above instructions have been reviewed and explained to me by   Marchelle Folks.     I fully understand and can verbalize these instructions _____________________________ Date _________

## 2010-02-06 NOTE — Assessment & Plan Note (Addendum)
Summary: RECALL COLONOSCOPY, on COUMADIN.Dean Pratt   History of Present Illness Visit Type: Follow-up Visit Primary GI MD: Dean Flemings MD Primary Leverne Amrhein: Dean Corn, MD Chief Complaint: Recall colon, patient taking coumadin History of Present Illness:   75 year old white male with multiple medical problems including hypertension, hyperlipidemia, atrial fibrillation status post ablation therapy x2, chronic systemic anticoagulation in the form of Coumadin, history of basal cell carcinoma, history of ischemic colitis(February 2010), GERD, and multiple adenomatous polyps. He presents today regarding surveillance colonoscopy. His last complete colonoscopy was performed February 25, 2008. He was found to have greater than 20 adenomatous which were removed. He was off Coumadin therapy at that time. He was to followup in one year. However, he is overdue for followup because of interval problems with atrial arrhythmia of his heart for which he is seeing Dr. Johney Pratt. Currently in sinus rhythm and feeling well. He denies upper or lower GI complaints. He is on pantoprazole chronically for GERD.   GI Review of Systems      Denies abdominal pain, acid reflux, belching, bloating, chest pain, dysphagia with liquids, dysphagia with solids, heartburn, loss of appetite, nausea, vomiting, vomiting blood, weight loss, and  weight gain.        Denies anal fissure, black tarry stools, change in bowel habit, constipation, diarrhea, diverticulosis, fecal incontinence, heme positive stool, hemorrhoids, irritable bowel syndrome, jaundice, light color stool, liver problems, rectal bleeding, and  rectal pain.    Current Medications (verified): 1)  Warfarin Sodium 3 Mg Tabs (Warfarin Sodium) .... Use As Directed By Anticoagualtion Clinic 2)  Allopurinol 100 Mg Tabs (Allopurinol) .Dean Pratt.. 1 Tablet By Mouth At Bedtime 3)  Crestor 10 Mg Tabs (Rosuvastatin Calcium) .Dean Pratt.. 1 Tablet By Mouth Qd 4)  Aspirin 81 Mg Tbec (Aspirin) .Dean Pratt.. 1 Tablet  By Mouth Once Daily 5)  Lorazepam 0.5 Mg Tabs (Lorazepam) .Dean Pratt.. 1 Tablet By Mouth 1-2 Times Daily As Needed 6)  Vitamin C Cr 1000 Mg Cr-Tabs (Ascorbic Acid) .... 2 Tablets By Mouth Once Daily 7)  Tiazac 240 Mg Xr24h-Cap (Diltiazem Hcl Er Beads) .Dean Pratt.. 1 Capsule By Mouth Once Daily 8)  Indomethacin 25 Mg Caps (Indomethacin) .... As Needed For Gout 9)  Pantoprazole Sodium 40 Mg Tbec (Pantoprazole Sodium) .... Take One Tablet By Mouth Once Daily. 10)  Hydrocodone-Acetaminophen 5-500 Mg Tabs (Hydrocodone-Acetaminophen) .... As Needed 11)  Stool Softener 100 Mg Caps (Docusate Sodium) .... Take 1 Capsule By Mouth Once A Day  Allergies (verified): No Known Drug Allergies  Past History:  Past Medical History: Reviewed history from 11/22/2009 and no changes required. Paroxysmal atrial fibrillation Atrial flutter s/p afib and atrial flutter ablation 04/09/09   a.  DCCV 07/2009   b   Redo Ablation 10/16/2009 h/o ischemic colitis (per pt) HTN HL Migraines Gout Rosacea Basal Cell CA removed h/o tuberculosis constipation  Past Surgical History: Reviewed history from 04/17/2009 and no changes required. Appendectomy inguinal herniorrhaphy Tonsillectomy Vasectomy s/p afib and atrial flutter ablation 04/09/09  Family History: Reviewed history from 02/27/2009 and no changes required. Family History of Breast Cancer: Sister No FH of Colon Cancer: Family History of Liver Cancer: Father Family History of Prostate Cancer: Father   Father died of prostate cancer and coronary artery   disease at age 24.  Mother died of COPD at age 86.  Brother and a sister are living.   Social History: Reviewed history from 02/27/2009 and no changes required. Pt lives in [place].    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  He is  married and lives at home with his wife.  He used to smoke but quit  smoking many years ago.  Does get regular exercise.  No alcohol use.   Review of  Systems  The patient denies allergy/sinus, anemia, anxiety-new, arthritis/joint pain, back pain, blood in urine, breast changes/lumps, change in vision, confusion, cough, coughing up blood, depression-new, fainting, fatigue, fever, headaches-new, hearing problems, heart murmur, heart rhythm changes, itching, menstrual pain, muscle pains/cramps, night sweats, nosebleeds, pregnancy symptoms, shortness of breath, skin rash, sleeping problems, sore throat, swelling of feet/legs, swollen lymph glands, thirst - excessive , urination - excessive , urination changes/pain, urine leakage, vision changes, and voice change.    Vital Signs:  Patient profile:   75 year old male Height:      70 inches Weight:      168.13 pounds BMI:     24.21 Pulse rate:   60 / minute Pulse rhythm:   regular BP sitting:   142 / 80  (left arm) Cuff size:   regular  Vitals Entered By: June McMurray CMA Duncan Dull) (January 02, 2010 8:17 AM)  Physical Exam  General:  Well developed, well nourished, no acute distress. Head:  Normocephalic and atraumatic. Eyes:  PERRLA, no icterus. Mouth:  No deformity or lesions Neck:  Supple; no masses or thyromegaly. Lungs:  Clear throughout to auscultation. Heart:  Regular rate and rhythm; no murmurs, rubs,  or bruits. Abdomen:  Soft, nontender and nondistended. No masses, hepatosplenomegaly or hernias noted. Normal bowel sounds. Rectal:  deferred until colonoscopy Msk:  Symmetrical with no gross deformities. Normal posture. Pulses:  Normal pulses noted. Extremities:  no edema Neurologic:  alert and oriented Skin:  no jaundice Psych:  Alert and cooperative. Normal mood and affect.   Impression & Recommendations:  Problem # 1:  PERSONAL HX COLONIC POLYPS (ICD-V12.72) history of multiple adenomatous polyposis with question of MAP or attenuated FAP. Multiple significant medical problems that put him at high risk for surveillance colonoscopy. However, clinically stable and deemed an  appropriate candidate at this point. We will need to address his anticoagulation issues with his cardiologist.  Plan: #1. Colonoscopy. The nature of the procedure as well as the risks, benefits, and alternatives have been reviewed. He understood and agreed to proceed #2. Hold off on scheduling the procedure until he sees Dr. Johney Pratt on January 23, 2009. This will have been 3 months post ablation. We will send a letter regarding the feasibility of holding the patient's anticoagulation for the procedure. In addition, final confirmation after the patient's cardiology followup visit. #3. Movi prep prescribed. The patient instructed on its use.  Problem # 2:  ATRIAL FIBRILLATION (ICD-427.31) status post ablation. On Coumadin. Currently in sinus rhythm. See above discussion.  Problem # 3:  OTHER AND UNSPECIFIED COAGULATION DEFECTS (ICD-286.9) chronic systemic anticoagulation in the form of Coumadin. Relevance to the procedure as discussed above  Problem # 4:  ISCHEMIC COLITIS (ICD-557.9) February 2010. No recurrence  Other Orders: Colonoscopy (Colon)  Patient Instructions: 1)  Pick up your prep from your pharmacy.  2)  We will contact your regarding your coumadin clearance.  3)  Colonoscopy and Flexible Sigmoidoscopy brochure given.  4)  Copy sent to : Hillis Range, MD 5)                         Dean Corn, MD 6)  The medication list was reviewed and  reconciled.  All changed / newly prescribed medications were explained.  A complete medication list was provided to the patient / caregiver. Prescriptions: MOVIPREP 100 GM  SOLR (PEG-KCL-NACL-NASULF-NA ASC-C) As per prep instructions.  #1 x 0   Entered by:   Christie Nottingham CMA (AAMA)   Authorized by:   Hilarie Fredrickson MD   Signed by:   Christie Nottingham CMA (AAMA) on 01/02/2010   Method used:   Electronically to        [facility] [address] [phone]* (retail)       [address]       [place], [place]  [address]       Ph:  [phone] or [phone]       Fax: [phone]   RxID:   [id number]

## 2010-02-06 NOTE — Op Note (Signed)
Summary: operative report  NAME:  Dean Pratt, Dean Pratt                ACCOUNT NO.:  [id number]      MEDICAL RECORD NO.:  [id number]          PATIENT TYPE:  AMB      LOCATION:  DSC                          FACILITY:  [facility]      PHYSICIAN:  Currie Paris, M.D.DATE OF BIRTH:  June 24, 1935      DATE OF PROCEDURE:  08/01/2008   DATE OF DISCHARGE:                                  OPERATIVE REPORT      PREOPERATIVE DIAGNOSIS:  Right inguinal hernia.      POSTOPERATIVE DIAGNOSIS:  Right inguinal hernia. (indirect with a   sliding component).      PROCEDURE:  Right inguinal hernia repair with mesh.      SURGEON:  Currie Paris, MD      ANESTHESIA:  General.      CLINICAL HISTORY:  This is a 75 year old gentleman with a moderately   large left inguinal hernia that he wished to have repaired.      DESCRIPTION OF PROCEDURE:  The patient was seen in the holding area and   had no further questions.  We confirmed the surgery as noted above and I   initialed the right inguinal area.      The patient was taken to the operating room and after satisfactory   general anesthesia had been obtained the right inguinal area was   clipped, prepped and draped.  The time-out was performed.      I used 0.25% plain Marcaine to help with postop pain relief and injected   it all around the incision area and below the fascia near the anterior-   superior iliac spine.  Additional local was infiltrated during the   procedure as we got to deeper layers.      I made an inguinal incision and divided it down to the external oblique.   Bleeders were tied or coagulated.      The external oblique was opened in line of its fibers and the cord   dissected off the inguinal floor staying fairly medially.  The floor   appeared intact.  There was large amount of material coming through the   deep ring.      I was able to divide a little bit of the cremaster muscle and identify a   sac which was opened.  It was  actually a very thin-walled and I   dissected this off the cord back towards the deep ring.  I did notice   what appeared to be a bit of bladder fat as part of the medial wall of   the sac.  Once I had this well exposed I went ahead and closed the sac   with a pursestring of 2-0 silk taking care to stay out of bladder fat   and excised the excess sac.  This was all reduced down.      There was a bunch of fibrotic material coming in and stuck to the back   wall of the cord, I got all of it divided so that I had  the cord fully   exposed for its entire length and good exposure of the inguinal floor.   Anything that looked like a blood vessel was tied or coagulated.  I made   sure everything was dry.      I then put a 2-0 Prolene medial to the deep ring to tighten the ring as   it was fairly patulous having had the large indirect sac come through.   I then put a piece of Atrium mesh which was cut laterally to go around   the cord and sutured in in the usual fashion using a running 2-0 Prolene   starting at the pubic tubercle.  It lay well medial onto the internal   oblique.  I had identified and kept the ilioinguinal nerve on the cord   and made sure that it came through with the cord structures and was not   entangled in the mesh.      Laterally, the mesh was tacked down.  I then made sure again everything   was dry.  The incision was closed with 3-0 Vicryl to close the external   oblique and Scarpa's and 4-0 Monocryl subcuticular Dermabond on the   skin.      The patient tolerated the procedure well.  There were no operative   complications.  All counts were correct.               Currie Paris, M.D.   Electronically Signed            CJS/MEDQ  D:  08/01/2008  T:  08/01/2008  Job:  [id number]      cc:   Gwen Pounds, MD   Georga Hacking, M.D.

## 2010-02-12 NOTE — Letter (Signed)
Summary: Patient Notice- Polyp Results  [facility]  [address] [place], [place] [address]   Phone: [phone]  Fax: [phone]        February 03, 2010 MRN: [id number]    Dean Pratt [address] [place], [place]  [address]    Dear [name],  I am pleased to inform you that the colon polyp(s) removed during your recent colonoscopy was (were) found to be benign (no cancer detected) upon pathologic examination.  I recommend you have a repeat colonoscopy examination in one year to look for recurrent polyps, as having colon polyps increases your risk for having recurrent polyps or even colon cancer in the future.  Should you develop new or worsening symptoms of abdominal pain, bowel habit changes or bleeding from the rectum or bowels, please schedule an evaluation with either your primary care physician or with me.  Additional information/recommendations:  __ No further action with gastroenterology is needed at this time. Please      follow-up with your primary care physician for your other healthcare      needs.    Please call us if you are having persistent problems or have questions about your condition that have not been fully answered at this time.  Sincerely,  Hilarie Fredrickson MD  This letter has been electronically signed by your physician.  Appended Document: Patient Notice- Polyp Results LETTER MAILED

## 2010-03-20 LAB — PROTIME-INR
INR: 2.51 — ABNORMAL HIGH (ref 0.00–1.49)
Prothrombin Time: 27.2 seconds — ABNORMAL HIGH (ref 11.6–15.2)

## 2010-03-20 LAB — MRSA PCR SCREENING: MRSA by PCR: NEGATIVE

## 2010-03-22 LAB — PROTIME-INR
INR: 2.77 — ABNORMAL HIGH (ref 0.00–1.49)
Prothrombin Time: 29 seconds — ABNORMAL HIGH (ref 11.6–15.2)

## 2010-03-26 LAB — MRSA PCR SCREENING: MRSA by PCR: NEGATIVE

## 2010-03-26 LAB — PROTIME-INR: Prothrombin Time: 27.8 seconds — ABNORMAL HIGH (ref 11.6–15.2)

## 2010-03-31 ENCOUNTER — Encounter: Payer: Self-pay | Admitting: *Deleted

## 2010-04-08 ENCOUNTER — Ambulatory Visit
Admission: RE | Admit: 2010-04-08 | Discharge: 2010-04-08 | Disposition: A | Discharge status: Home / Self Care | Payer: Self-pay | Source: Ambulatory Visit | Attending: Internal Medicine | Admitting: Internal Medicine

## 2010-04-08 ENCOUNTER — Inpatient Hospital Stay (HOSPITAL_COMMUNITY)
Admission: AD | Admit: 2010-04-08 | Discharge: 2010-04-10 | DRG: 066 | Disposition: A | Discharge status: Home / Self Care | Payer: Medicare Other | Source: Ambulatory Visit | Attending: Internal Medicine | Admitting: Internal Medicine

## 2010-04-08 ENCOUNTER — Other Ambulatory Visit: Payer: Self-pay | Admitting: Internal Medicine

## 2010-04-08 DIAGNOSIS — R519 Headache, unspecified: Secondary | ICD-10-CM

## 2010-04-08 DIAGNOSIS — N400 Enlarged prostate without lower urinary tract symptoms: Secondary | ICD-10-CM | POA: Diagnosis present

## 2010-04-08 DIAGNOSIS — M1990 Unspecified osteoarthritis, unspecified site: Secondary | ICD-10-CM | POA: Diagnosis present

## 2010-04-08 DIAGNOSIS — Z85828 Personal history of other malignant neoplasm of skin: Secondary | ICD-10-CM

## 2010-04-08 DIAGNOSIS — I10 Essential (primary) hypertension: Secondary | ICD-10-CM | POA: Diagnosis present

## 2010-04-08 DIAGNOSIS — H53469 Homonymous bilateral field defects, unspecified side: Secondary | ICD-10-CM

## 2010-04-08 DIAGNOSIS — Z79899 Other long term (current) drug therapy: Secondary | ICD-10-CM

## 2010-04-08 DIAGNOSIS — I619 Nontraumatic intracerebral hemorrhage, unspecified: Principal | ICD-10-CM | POA: Diagnosis present

## 2010-04-08 DIAGNOSIS — E785 Hyperlipidemia, unspecified: Secondary | ICD-10-CM | POA: Diagnosis present

## 2010-04-08 DIAGNOSIS — I639 Cerebral infarction, unspecified: Secondary | ICD-10-CM

## 2010-04-08 DIAGNOSIS — F411 Generalized anxiety disorder: Secondary | ICD-10-CM | POA: Diagnosis present

## 2010-04-08 DIAGNOSIS — I4891 Unspecified atrial fibrillation: Secondary | ICD-10-CM | POA: Diagnosis present

## 2010-04-08 DIAGNOSIS — R791 Abnormal coagulation profile: Secondary | ICD-10-CM | POA: Diagnosis present

## 2010-04-08 HISTORY — DX: Cerebral infarction, unspecified: I63.9

## 2010-04-08 LAB — CBC
HCT: 40.8 % (ref 39.0–52.0)
Hemoglobin: 14.6 g/dL (ref 13.0–17.0)
RBC: 4.73 MIL/uL (ref 4.22–5.81)
WBC: 8.4 10*3/uL (ref 4.0–10.5)

## 2010-04-08 LAB — ABO/RH: ABO/RH(D): O POS

## 2010-04-08 LAB — TYPE AND SCREEN: Antibody Screen: NEGATIVE

## 2010-04-08 MED ORDER — GADOBENATE DIMEGLUMINE 529 MG/ML IV SOLN
15.0000 mL | Freq: Once | INTRAVENOUS | Status: AC | PRN
  Administered 2010-04-08: 15 mL via INTRAVENOUS

## 2010-04-09 ENCOUNTER — Inpatient Hospital Stay (HOSPITAL_COMMUNITY): Payer: Medicare Other

## 2010-04-09 ENCOUNTER — Ambulatory Visit: Payer: Self-pay | Admitting: Internal Medicine

## 2010-04-09 ENCOUNTER — Other Ambulatory Visit: Payer: Self-pay

## 2010-04-09 LAB — PROTIME-INR
INR: 1.89 — ABNORMAL HIGH (ref 0.00–1.49)
INR: 1.44 (ref 0.00–1.49)
Prothrombin Time: 21.9 seconds — ABNORMAL HIGH (ref 11.6–15.2)
Prothrombin Time: 17.7 seconds — ABNORMAL HIGH (ref 11.6–15.2)

## 2010-04-09 LAB — CBC
MCH: 30 pg (ref 26.0–34.0)
Platelets: 172 10*3/uL (ref 150–400)
RBC: 4.64 MIL/uL (ref 4.22–5.81)
WBC: 8.1 10*3/uL (ref 4.0–10.5)

## 2010-04-09 LAB — GLUCOSE, CAPILLARY: Glucose-Capillary: 93 mg/dL (ref 70–99)

## 2010-04-09 LAB — PREPARE FRESH FROZEN PLASMA: Unit division: 0

## 2010-04-09 LAB — BASIC METABOLIC PANEL
Chloride: 109 mEq/L (ref 96–112)
Creatinine, Ser: 1.05 mg/dL (ref 0.4–1.5)
GFR calc Af Amer: >60 mL/min (ref >60)
GFR calc non Af Amer: >60 mL/min (ref >60)

## 2010-04-10 ENCOUNTER — Other Ambulatory Visit: Payer: Self-pay | Admitting: Neurosurgery

## 2010-04-10 DIAGNOSIS — IMO0002 Reserved for concepts with insufficient information to code with codable children: Secondary | ICD-10-CM

## 2010-04-10 DIAGNOSIS — H53469 Homonymous bilateral field defects, unspecified side: Secondary | ICD-10-CM

## 2010-04-10 DIAGNOSIS — I629 Nontraumatic intracranial hemorrhage, unspecified: Secondary | ICD-10-CM

## 2010-04-10 HISTORY — DX: Nontraumatic intracranial hemorrhage, unspecified: I62.9

## 2010-04-10 LAB — CBC
HCT: 41.9 % (ref 39.0–52.0)
Hemoglobin: 14.7 g/dL (ref 13.0–17.0)
MCH: 30.5 pg (ref 26.0–34.0)
RBC: 4.82 MIL/uL (ref 4.22–5.81)
WBC: 8.7 10*3/uL (ref 4.0–10.5)

## 2010-04-10 LAB — PREPARE FRESH FROZEN PLASMA: Unit division: 0

## 2010-04-10 LAB — BASIC METABOLIC PANEL
Calcium: 9.3 mg/dL (ref 8.4–10.5)
GFR calc Af Amer: >60 mL/min (ref >60)
GFR calc non Af Amer: >60 mL/min (ref >60)
Potassium: 3.8 mEq/L (ref 3.5–5.1)
Sodium: 141 mEq/L (ref 135–145)

## 2010-04-10 LAB — PROTIME-INR
INR: 1.18 (ref 0.00–1.49)
Prothrombin Time: 15.2 seconds (ref 11.6–15.2)

## 2010-04-13 LAB — PROTIME-INR
INR: 1.1 (ref 0.00–1.49)
Prothrombin Time: 14.6 seconds (ref 11.6–15.2)

## 2010-04-13 LAB — POCT I-STAT, CHEM 8
Calcium, Ion: 1.21 mmol/L (ref 1.12–1.32)
Chloride: 105 mEq/L (ref 96–112)
Creatinine, Ser: 0.7 mg/dL (ref 0.4–1.5)
Glucose, Bld: 95 mg/dL (ref 70–99)
HCT: 44 % (ref 39.0–52.0)
Hemoglobin: 15 g/dL (ref 13.0–17.0)

## 2010-04-13 LAB — DIFFERENTIAL
Basophils Relative: 1 % (ref 0–1)
Eosinophils Absolute: 0.1 10*3/uL (ref 0.0–0.7)
Eosinophils Relative: 2 % (ref 0–5)
Neutrophils Relative %: 68 % (ref 43–77)

## 2010-04-13 LAB — CBC
MCHC: 33.6 g/dL (ref 30.0–36.0)
MCV: 90.6 fL (ref 78.0–100.0)
Platelets: 192 10*3/uL (ref 150–400)

## 2010-04-17 ENCOUNTER — Ambulatory Visit
Admission: RE | Admit: 2010-04-17 | Discharge: 2010-04-17 | Disposition: A | Discharge status: Home / Self Care | Payer: No Typology Code available for payment source | Source: Ambulatory Visit | Attending: Neurosurgery | Admitting: Neurosurgery

## 2010-04-17 DIAGNOSIS — H53469 Homonymous bilateral field defects, unspecified side: Secondary | ICD-10-CM

## 2010-04-17 DIAGNOSIS — IMO0002 Reserved for concepts with insufficient information to code with codable children: Secondary | ICD-10-CM

## 2010-04-17 LAB — CBC
HCT: 39.8 % (ref 39.0–52.0)
MCHC: 35.5 g/dL (ref 30.0–36.0)
Platelets: 272 10*3/uL (ref 150–400)
RDW: 13 % (ref 11.5–15.5)

## 2010-04-17 LAB — PROTIME-INR: INR: 2 — ABNORMAL HIGH (ref 0.00–1.49)

## 2010-04-20 ENCOUNTER — Other Ambulatory Visit: Payer: Self-pay | Admitting: Internal Medicine

## 2010-04-22 LAB — CBC
HCT: 32.7 % — ABNORMAL LOW (ref 39.0–52.0)
HCT: 33.6 % — ABNORMAL LOW (ref 39.0–52.0)
HCT: 34.1 % — ABNORMAL LOW (ref 39.0–52.0)
HCT: 37.6 % — ABNORMAL LOW (ref 39.0–52.0)
HCT: 39.8 % (ref 39.0–52.0)
HCT: 44 % (ref 39.0–52.0)
Hemoglobin: 11.4 g/dL — ABNORMAL LOW (ref 13.0–17.0)
Hemoglobin: 11.8 g/dL — ABNORMAL LOW (ref 13.0–17.0)
Hemoglobin: 11.9 g/dL — ABNORMAL LOW (ref 13.0–17.0)
Hemoglobin: 13.1 g/dL (ref 13.0–17.0)
Hemoglobin: 13.8 g/dL (ref 13.0–17.0)
Hemoglobin: 13.9 g/dL (ref 13.0–17.0)
Hemoglobin: 15.5 g/dL (ref 13.0–17.0)
MCHC: 34.8 g/dL (ref 30.0–36.0)
MCHC: 35.1 g/dL (ref 30.0–36.0)
MCHC: 35.2 g/dL (ref 30.0–36.0)
MCV: 91.3 fL (ref 78.0–100.0)
MCV: 91.6 fL (ref 78.0–100.0)
MCV: 92.2 fL (ref 78.0–100.0)
MCV: 92.3 fL (ref 78.0–100.0)
MCV: 92.3 fL (ref 78.0–100.0)
Platelets: 219 10*3/uL (ref 150–400)
RBC: 3.67 MIL/uL — ABNORMAL LOW (ref 4.22–5.81)
RBC: 4.29 MIL/uL (ref 4.22–5.81)
RDW: 12.9 % (ref 11.5–15.5)
RDW: 12.9 % (ref 11.5–15.5)
RDW: 13.3 % (ref 11.5–15.5)
RDW: 13.5 % (ref 11.5–15.5)
WBC: 11.6 10*3/uL — ABNORMAL HIGH (ref 4.0–10.5)
WBC: 8.2 10*3/uL (ref 4.0–10.5)

## 2010-04-22 LAB — COMPREHENSIVE METABOLIC PANEL
AST: 17 U/L (ref 0–37)
Albumin: 2.7 g/dL — ABNORMAL LOW (ref 3.5–5.2)
Albumin: 3.3 g/dL — ABNORMAL LOW (ref 3.5–5.2)
Alkaline Phosphatase: 105 U/L (ref 39–117)
Alkaline Phosphatase: 60 U/L (ref 39–117)
Alkaline Phosphatase: 66 U/L (ref 39–117)
BUN: 14 mg/dL (ref 6–23)
BUN: 5 mg/dL — ABNORMAL LOW (ref 6–23)
CO2: 27 mEq/L (ref 19–32)
Calcium: 9.2 mg/dL (ref 8.4–10.5)
Chloride: 104 mEq/L (ref 96–112)
Chloride: 106 mEq/L (ref 96–112)
Creatinine, Ser: 1.29 mg/dL (ref 0.4–1.5)
GFR calc Af Amer: >60 mL/min (ref >60)
GFR calc non Af Amer: >60 mL/min (ref >60)
GFR calc non Af Amer: >60 mL/min (ref >60)
Glucose, Bld: 107 mg/dL — ABNORMAL HIGH (ref 70–99)
Glucose, Bld: 116 mg/dL — ABNORMAL HIGH (ref 70–99)
Potassium: 3.8 mEq/L (ref 3.5–5.1)
Potassium: 3.9 mEq/L (ref 3.5–5.1)
Potassium: 4 mEq/L (ref 3.5–5.1)
Total Bilirubin: 0.8 mg/dL (ref 0.3–1.2)
Total Bilirubin: 0.9 mg/dL (ref 0.3–1.2)
Total Protein: 6.5 g/dL (ref 6.0–8.3)

## 2010-04-22 LAB — URINALYSIS, ROUTINE W REFLEX MICROSCOPIC
Glucose, UA: NEGATIVE mg/dL
Hgb urine dipstick: NEGATIVE
Protein, ur: 30 mg/dL — AB
Specific Gravity, Urine: 1.039 — ABNORMAL HIGH (ref 1.005–1.030)
pH: 5.5 (ref 5.0–8.0)

## 2010-04-22 LAB — CEA: CEA: 0.5 ng/mL (ref 0.0–5.0)

## 2010-04-22 LAB — DIFFERENTIAL
Basophils Absolute: 0 10*3/uL (ref 0.0–0.1)
Basophils Relative: 0 % (ref 0–1)
Basophils Relative: 1 % (ref 0–1)
Eosinophils Absolute: 0.3 10*3/uL (ref 0.0–0.7)
Eosinophils Relative: 3 % (ref 0–5)
Lymphocytes Relative: 5 % — ABNORMAL LOW (ref 12–46)
Lymphs Abs: 1.2 10*3/uL (ref 0.7–4.0)
Monocytes Absolute: 0.6 10*3/uL (ref 0.1–1.0)
Monocytes Absolute: 1.6 10*3/uL — ABNORMAL HIGH (ref 0.1–1.0)
Monocytes Relative: 7 % (ref 3–12)
Neutro Abs: 20.9 10*3/uL — ABNORMAL HIGH (ref 1.7–7.7)
Neutrophils Relative %: 88 % — ABNORMAL HIGH (ref 43–77)

## 2010-04-22 LAB — FLECAINIDE LEVEL: Flecainide: 0.28 ug/mL (ref 0.20–1.00)

## 2010-04-22 LAB — URINE CULTURE
Colony Count: NO GROWTH
Culture: NO GROWTH

## 2010-04-22 LAB — URINE MICROSCOPIC-ADD ON

## 2010-04-22 LAB — PROTIME-INR
INR: 1.4 (ref 0.00–1.49)
INR: 1.4 (ref 0.00–1.49)

## 2010-04-22 LAB — HEPARIN LEVEL (UNFRACTIONATED)
Heparin Unfractionated: 0.14 IU/mL — ABNORMAL LOW (ref 0.30–0.70)
Heparin Unfractionated: 0.15 IU/mL — ABNORMAL LOW (ref 0.30–0.70)

## 2010-04-23 NOTE — Discharge Summary (Signed)
NAMEPHARAOH, Pratt                ACCOUNT NO.:  [id number]  MEDICAL RECORD NO.:  [id number]           PATIENT TYPE:  I  LOCATION:  3107                         FACILITY:  [facility]  PHYSICIAN:  Gwen Pounds, MD       DATE OF BIRTH:  08-19-1935  DATE OF ADMISSION:  04/08/2010 DATE OF DISCHARGE:                              DISCHARGE SUMMARY   DISCHARGE DIAGNOSES: 1. 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 2. Paroxysmal atrial fibrillation status post recent ablation per Dr.     Johney Frame. 3. On chronic anticoagulation which was reversed and discontinued. 4. Hypertension. 5. Hyperlipidemia. 6. History of ischemic colitis secondary to emboli from the atrial     fibrillation in January 2010. 7. History of migraine with aura. 8. Anxiety. 9. History of basal cell carcinoma. 10.Benign prostatic hypertrophy. 11.Gout. 12.History of colon polyps. 13.Osteoarthritis. 14.History of tuberculosis status post treatment in 1964. 15.Varicose veins, spider veins. 16.Status post appendectomy. 17.Status post prostatectomy. 18.Status post bilateral inguinal hernia repair. 19.History of right eye laser surgery. 20.Status post multiple colon polypectomies.  DISCHARGE MEDICATION: 1. Tylenol 650 mg by mouth every 6 hours as needed. 2. Lisinopril 5 mg p.o. daily, this is new medication. 3. Allopurinol 100 mg by mouth daily. 4. Crestor 10 mg p.o. daily 5. Diltiazem CD 240 mg 1 capsule by mouth every morning. 6. Docusate 1 tablet by mouth weekly as needed for constipation. 7. Vicodin 5/100, 1 tablet by mouth every 8 hours as needed. 8. Lorazepam 0.5 mg 1/2 tablet by mouth twice daily as directed. 9. Protonix 40 mg 1 p.o. daily. 10.Vitamin C 2 tablets by mouth every day up to 1000 mg dose and he is     to discontinue the aspirin and the warfarin at this current time.  DISCHARGE PROCEDURES: 1. Outpatient MRI revealed 2.6 x 1.6 x 2.2 right occipital  hemorrhage     with surrounding vasogenic edema. 2. Cranial CT on April 4 shows stable acute intraparenchymal hematoma     on the right occipital lobe measuring 2.6 cm on the CT scan. 3. Consultations with Dr. Donnie Aho and Dr. Newell Coral. 4. Medical management. 5. Reversal of Coumadin.  HISTORY OF PRESENT ILLNESS:  Briefly, [name] is a 75 year old male with history of paroxysmal AFib with a history of AFib induced abdominal ischemia in January 2010 who has remained on chronic anticoagulation for many years who also has hypertension, hyperlipidemia for 2-3 days prior to presentation, had a headache for which he thought was his migraines. Over the ensuing couple of days, he noticed that he was having some vision issues, but did not realize how bad they were.  He presented to Dr. Laruth Bouchard office at [facility] and was diagnosed with a dense homonymous hemianopsia and there was a call into our office which I took that wanted him to have a semi-urgent evaluation, for the possibility of an subacute stroke.  The feeling that this was embolic and probably due to subtherapeutic INR, he was brought over to the office immediately for an EKG and lab work and  to get imaging.  He had no other neurologic deficits.  Dr. Clelia Croft saw him and labs were undertaken.  His INR was 3.2.  The rest of his labs were fine.  He was sent over for the MRI which revealed an occipital hemorrhage measuring 2.6 x 1.6 x 2.2 with surrounding vasogenic edema.  The patient was directly admitted to 3100, the neuro ICU and Coumadin was reversed with vitamin K, FFP, and neurosurgery was brought on board for evaluation and treatment.  Of note, Mr. Hevia did undergo evaluation per Dr. Johney Frame in January with TEE followed by ablation of his paroxysmal AFib.  HOSPITAL COURSE:  Mr. Hua was admitted directly from our office with the occipital hemorrhage leading to stroke-like event including the left eye blindness and  homonymous hemianopsia presentation.  In the neuro ICU, he was seen and evaluated by Dr. Newell Coral and I consulted Dr. Donnie Aho who came by as well.  He passed a swallowing eval and he had no other neurologic deficits.  Dr. Earl Gala impression was right occipital intracerebral hematoma without evidence of shift or significant mass effect but with resulting left homonymous hemianopsia was just completely therapeutically anticoagulated with Coumadin, is currently undergoing reversal of it with vitamin K and FFP with subsequent check of INR ordered.  CT followup will be done in the morning.  It was done and was stable.  No neurosurgical intervention or surgery were needed. The need for subsequent followup MRI in 3-4 months without and with contrast to rule out underlying tumor or vascular malformation of the brain (although unlikely) will be done.  The hemorrhages is felt just to be contributed per the Coumadin and amyloid angiopathy.  Dr. Newell Coral did discuss extensively with the patient, his brother, his son regarding the assessment and recommendations and all questions were answered.  His blood pressure was noted to be little high and lisinopril was added. His blood pressure this morning was a little bit low but he is in the 105-110 range and is stable.  The lisinopril will be continued but we will monitor closely to see if we can maintain a pretty decent blood pressure without going too lower, too high.  He remained in normal sinus rhythm throughout the whole hospital stay.  His INR was reversed and on day of discharge is 1.12.  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  PLAN:  The patient was seen and evaluated on April 10, 2010, he was doing better, the headache is improving.  He says he is getting some eyesight back - nt appreciated on my exam.   There was no objective improvement.  All his vital signs are stable.  His blood pressure is 105-110.  Physical exam is unremarkable except for the blindness.  It was determined it was okay to discharge him home in stable condition today.  His BMET was normal, his blood sugar is 100.  His INR is 1.18, hemoglobin is 14.7.  White count 8.7, platelet count is 169,000.  Dr. Newell Coral saw him prior to me and said the patient can follow up with Dr. Newell Coral in the office next Thursday or Friday with a CT scan prior and to be done without contrast on the day of appointment.  The patient is to call his secretary Rene Kocher at [phone] who will set up a CT scan and the appointment.  We will plan another  CT scan in a month if better and follow up MRI in 3-4 months from now for the reasons mentioned above.  He recommended no anticoagulation for antiplatelet agents for now and this will be honored.  May be able to resume the aspirin after the CT scan done next week and no driving until told otherwise.  It is recommended that the patient follow up with me in 2-3 weeks and follow up with Dr. Dione Booze as deemed appropriate.     Gwen Pounds, MD     JMR/MEDQ  D:  04/10/2010  T:  04/10/2010  Job:  [id number]  cc:   Hewitt Shorts, M.D. Robert L. Dione Booze, M.D. Georga Hacking, M.D. Hillis Range, MD  Electronically Signed by [name] MD on 04/23/2010 11:52:18 AM

## 2010-04-23 NOTE — H&P (Signed)
NAME:  Dean Pratt, Dean Pratt                ACCOUNT NO.:  [id number]  MEDICAL RECORD NO.:  [id number]           PATIENT TYPE:  I  LOCATION:  3107                         FACILITY:  [facility]  PHYSICIAN:  Kari Baars, M.D.  DATE OF BIRTH:  November 19, 1935  DATE OF ADMISSION:  04/08/2010 DATE OF DISCHARGE:                             HISTORY & PHYSICAL   CHIEF COMPLAINT:  Left vision changes.  HISTORY OF PRESENT ILLNESS:  Dean Pratt is a 75 year old white male with a history of paroxysmal atrial fibrillation status post ablation (01/12) on chronic anticoagulation, hypertension, hyperlipidemia presented to our office this morning for evaluation of left visual field deficits. The patient states that he was in his usual state of health until 2-3 days ago when he developed a moderately severe headache, which he thought was similar to prior migraines.  Over the ensuing 2 days, he developed left visual field deficit which may have slightly worsened today, but has been relatively stable.  He was unable to see at left side both of his eyes.  He saw Dr. Dione Booze this morning who diagnosed in with a dense homonymous hemianopsia and called our office for urgent evaluation.  The patient was brought to our office where he described no other neurologic deficits.  As stated, he denied weakness, sensory deficits, speech changes, or gait instability.  He is on Coumadin for a history of atrial fibrillation.  He underwent a successful ablation on 01/12 by Dr. Johney Frame.  He had one prior unsuccessful ablation.  His INR has been therapeutic on Coumadin and in fact was 3.2 today.  He denies any prior TIA symptoms.  He does take both aspirin and Coumadin given his prior history of ischemic colitis related to emboli in 01/2008. Given the deficits, he was sent for an MRI of the brain to evaluate for subacute stroke and was found to have a right occipital hemorrhage measuring 2.6 x 1.6 x 2.2 cm with surrounding vasogenic  edema.  I did discuss this with Dr. Newell Coral.  Given these findings, the patient will be admitted for observation and reversal of his coagulopathy.  PAST MEDICAL HISTORY: 1. Paroxysmal atrial fibrillation status post ablation by Dr. Johney Frame     following a TEE (01/12) on chronic anticoagulation with Coumadin,     monitored by Dr. Donnie Aho. 2. Hypertension. 3. Hyperlipidemia. 4. History of ischemic colitis secondary to emboli (01/2008). 5. History of migraine with aura. 6. Anxiety. 7. History of basal cell carcinoma. 8. BPH. 9. Gout. 10.History of colon polyps. 11.Osteoarthritis. 12.History of tuberculosis status post treatment (1964). 13.Varicose/spider veins. 14.Status post appendectomy. 15.Status post vasectomy. 16.Status post bilateral inguinal hernia repair. 17.History of right eye laser surgery. 18.Status post multiple colon polypectomies.  CURRENT MEDICATIONS: 1. Diltiazem XR 240 mg daily. 2. Lorazepam 1 mg one-half to one b.i.d. p.r.n. 3. Indomethacin 50 mg q.4-6 h. p.r.n. gout. 4. Crestor 10 mg at bedtime. 5. Coumadin 3 mg daily except for one and a half on Tuesday and     Fridays. 6. Vitamin C daily. 7. Aspirin 81 mg daily. 8. Allopurinol 100 mg daily. 9. Protonix 40 mg daily. 10.Hydrocodone  and acetaminophen 5/500 q.6 h. p.r.n. 11.Stool softener daily.  ALLERGIES:  No known drug allergies.  SOCIAL HISTORY:  He is married with one child and one grandchild.  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  He smoked one pack per week for 3 years, but quit in 1963.  No alcohol use.  FAMILY HISTORY:  Father died of coronary disease at 12.  He had a history of liver cancer and prostate cancer.  Mother died at 93 due to COPD.  Family history also significant for coronary artery disease, breast cancer, and prostate cancer.  REVIEW OF SYSTEMS:  All systems reviewed with the patient and are negative except in HPI.  PHYSICAL EXAMINATION:  VITAL SIGNS:  Pulse 62, blood  pressure initially 162/82 and 134/72 on recheck, respirations 16, weight 170.6. GENERAL:  Pleasant gentleman in no acute distress. HEENT:  His pupils are dilated by the ophthalmologist, minimally reactive.  Extraocular movements are intact.  His disks are sharp with no hemorrhage or exudates seen on funduscopic exam.  Visual fields show a dense left homonymous hemianopsia of both eyes.  He is unable to see in the left upper or left lower quadrant.  Oropharynx is moist without erythema. NECK:  Supple without lymphadenopathy, JVD, or carotid bruits. HEART:  Regular rate and rhythm without murmurs, rubs, or gallops. LUNGS:  Clear to auscultation bilaterally. ABDOMEN:  Soft, nondistended, nontender with normoactive bowel sounds. EXTREMITIES:  No clubbing, cyanosis, or edema. NEUROLOGIC: Cranial nerves II through XII are intact other than the visual field defects.  Motor strength is 5/5 in all extremities. Sensation is grossly intact.  Deep tendon reflexes are 2+ and symmetric. No gait ataxia.  Romberg is negative.  Finger-to-nose is intact except for difficulty in the left visual field.  LABORATORY DATA:  Labs obtained in the office include a CBC that shows a white count of 6.6, hemoglobin 15.5, platelets 224.  CMET significant for sodium 144, potassium 4.0, chloride 106, bicarb 24, BUN 13, creatinine 0.9, glucose 95.  Liver function tests are normal.  INR is 3.2.  STUDIES:  EKG shows normal sinus rhythm with nonspecific ST changes. MRI (discussed with Dr. Constance Goltz) shows a right occipital lobe 2.6 x 1.6 x 2.2 cm hematoma with surrounding vasogenic edema.  Local mass effect without displacement of the adjacent ventricle.  No evidence of intracranial metastatic disease.  No acute thrombotic infarct.  ASSESSMENT/PLAN: 1. Right occipital hematoma with left homonymous hemianopsia - the     patient will be admitted to the neuro ICU for frequent neurologic     monitoring.  His coagulopathy will  be reversed with vitamin K 10 mg     IM x1.  We will repeat x1 in 4 hours.  We will give two units of     FFP with recheck of his INR with a consideration of additional FFP     until his INR is below 1.5.  We will repeat CT scan in the morning     to monitor for any progression of his bleed.  His symptoms have     been relatively stable over the past 2 days suggesting that this is     more of a subacute hemorrhage.  I have discussed this with Dr.     Newell Coral who will see the patient in consultation to monitor his     course with [place].  Hopefully, he will not need evacuation. 2. Paroxysmal atrial fibrillation on anticoagulation - his INR today  was 3.2.  This will be reversed with vitamin K and FFP as above.     He has had an ablation which appears to be successful as his rhythm     today is normal sinus rhythm.  We will hold off on anticoagulation     for the time being due to his cerebral hemorrhage. 3. Hypertension - his blood pressure was improved on recheck in the     office.  We will try to manage tight blood pressure control with     use of hydralazine as needed for breakthrough hypertension.  We     will continue his diltiazem.  Consider ACE inhibitor if his blood     pressure increases further. 4. Deep venous thrombosis prophylaxis with SCDs.  DISPOSITION:  Anticipate discharge to home once his bleed is deemed stable.     Kari Baars, M.D.     WS/MEDQ  D:  04/08/2010  T:  04/08/2010  Job:  [id number]  cc:   Gwen Pounds, MD Georga Hacking, M.D. Hillis Range, MD Hewitt Shorts, M.D.  Electronically Signed by Lacretia Nicks. Buren Kos M.D. on 04/23/2010 02:43:32 PM

## 2010-04-30 ENCOUNTER — Encounter: Payer: Self-pay | Admitting: Internal Medicine

## 2010-04-30 ENCOUNTER — Ambulatory Visit (INDEPENDENT_AMBULATORY_CARE_PROVIDER_SITE_OTHER): Payer: Medicare Other | Admitting: Internal Medicine

## 2010-04-30 VITALS — BP 130/80 | HR 69 | Ht 70.0 in | Wt 167.0 lb

## 2010-04-30 DIAGNOSIS — IMO0002 Reserved for concepts with insufficient information to code with codable children: Secondary | ICD-10-CM

## 2010-04-30 DIAGNOSIS — I4891 Unspecified atrial fibrillation: Secondary | ICD-10-CM

## 2010-04-30 DIAGNOSIS — K219 Gastro-esophageal reflux disease without esophagitis: Secondary | ICD-10-CM

## 2010-04-30 MED ORDER — PANTOPRAZOLE SODIUM 40 MG PO TBEC: 40.0000 mg | DELAYED_RELEASE_TABLET | Freq: Every day | ORAL | Status: DC

## 2010-04-30 NOTE — Assessment & Plan Note (Signed)
Stable No change required today  

## 2010-04-30 NOTE — Consult Note (Signed)
NAME:  Dean Pratt, Dean Pratt                ACCOUNT NO.:  [id number]  MEDICAL RECORD NO.:  [id number]          PATIENT TYPE:  LOCATION:                                 FACILITY:  PHYSICIAN:  Hewitt Shorts, M.D.DATE OF BIRTH:  1935-06-11  DATE OF CONSULTATION:  04/08/2010 DATE OF DISCHARGE:                                CONSULTATION   HISTORY OF PRESENT ILLNESS:  The patient is a 75 year old right hand white male admitted by Dr. Clelia Croft because of a right occipital intracerebral hematoma.  The patient presented with a 3-day history of headache, a 2-day history of loss of vision on the left side.  He was with evaluated by Dr. Ernesto Rutherford, his ophthalmologist and found to have a left homonymous hemianopsia.  He was referred to his primary physician's office.  His primary physician, Dr. [name], is off today and therefore Dr. Clelia Croft saw him and evaluated him.  The patient is referred for an MRI scan of the brain at [facility], this revealed a 2.6 x 2.2 x 1.6 cm right occipital intracerebral hematoma and Dr. Clelia Croft admitted the patient to the neurosurgical intensive care unit, requested neurosurgical consultation.  His history is notable for history of atrial fibrillation.  He underwent an ablation by Dr. Hillis Range in January 2012.  He has been continued on Coumadin and has been followed as well by Dr. Viann Fish.  His INR this morning was 3.2.  Symptomatically, the patient complains of some occipital headache as well as a loss of vision on left side.  He denies weakness, seizures, nausea, diplopia, or blurred vision.  PAST MEDICAL HISTORY:  Notable for his history of atrial fibrillation status post an ablation as described above.  Also history of hypertension.  He does not describe any history of previous stroke, cancer, myocardial infarction, peptic ulcer disease, diabetes, but he does have a history of pulmonary tuberculosis remotely, which has  been inactive.  PAST SURGICAL HISTORY:  Just his ablation.  ALLERGIES:  He denies allergies to medications.  MEDICATIONS:  Indomethacin p.r.n., Crestor 10 mg daily, Coumadin, vitamin C, aspirin 81 mg today, allopurinol 100 mg daily, pantoprazole 40 mg today, Vicodin p.r.n., diltiazem ER 240 mg capsule daily.  FAMILY HISTORY:  Parents have passed on.  Father had a history of liver cancer and prostate cancer.  Mother had COPD, both died at age 30.  SOCIAL HISTORY:  The patient is married.  He continues to work as a Immunologist.  He does not smoke, does not use alcoholic beverages. He was a remote smoker and quit in 1963.  REVIEW OF SYSTEMS:  Notable for as described in his history of present illness, past medical history, but is otherwise unremarkable.  PHYSICAL EXAMINATION:  GENERAL:  The patient is a well-developed, well- nourished white male in no acute distress. VITAL SIGNS:  Temperature is 97, pulse 60, blood pressure 151/78. NEUROLOGIC:  Mental status shows the patient is awake, alert.  He is oriented to his name, [facility], and April 08, 2010.  He follows commands.  His speech is fluent.  He has good comprehension. Cranial nerves  show left homonymous hemianopsia by confrontation with a red target.  Pupils are dilated by his ophthalmology, Dr. Ernesto Rutherford, I do not have a value at this time.  Extraocular movements are intact. Facial movement is symmetrical.  Hearing is present bilaterally.  Palate movement is symmetrical.  Shoulder shrug is symmetrical and tongue is midline.  Motor examination shows 5/5 strength.  He has no drift. Sensation is intact.  Reflexes are brisk at both the biceps and quadriceps.  Gait and sensory not tested due to nature of his condition.  IMPRESSION:  Right occipital intracerebral hematoma without evidence of shift or significant mass effect but with a resulting left homonymous hemianopsia, which is completely therapeutically  anticoagulated with Coumadin and is currently undergoing reversal with vitamin K and fresh frozen plasma with subsequent check of INR ordered.  CT for followup without contrast is ordered for the morning.  RECOMMENDATIONS:  I agree with the current care and management.  At this time, no neurosurgical intervention is needed but I will continue to follow the patient with Dr. Clelia Croft and Dr. Timothy Lasso and the partners.  His INR does need to be normalized to try to reduce the risk of progression of this hematoma.  He will need subsequent followup MRI in about 3-4 months without and with contrast to rule out underlying tumor or vascular malformation of the brain, although I feel those are more unlikely and that most likely this is just a hemorrhage contributed to by his Coumadin and probably caused by amyloid angiopathy.  I had an opportunity to speak with the patient, his brother, and his son at length regarding my assessment, recommendations, and their questions were answered for them.     Hewitt Shorts, M.D.     RWN/MEDQ  D:  04/08/2010  T:  04/09/2010  Job:  [id number]  cc:   Kari Baars, M.D.  Electronically Signed by Shirlean Kelly M.D. on 04/30/2010 12:50:37 PM

## 2010-04-30 NOTE — Assessment & Plan Note (Signed)
Doing well s/p ablation without recurrence of afib off antiarrhythmic medicine. Given his recent intracranial bleeding, coumadin has been discontinued.  He will eventually restart ASA once cleared by neurology. No medicine change are made today.  He will follow closely with Dr Donnie Aho and I will see him again in 6 months.

## 2010-04-30 NOTE — Progress Notes (Signed)
The patient presents today for routine electrophysiology followup.  He has done reasonably well since his afib ablation.  He has had no recurrence of afib.  Though he has not had procedure related complications, he was hospitalized 4/12 with Right occipital hematoma with a left homonymous hemianopsia (left     lateral vision loss).  He feels that his hemianopsia has nearly resolved.  Due to bleeding, coumadin was discontinued.  He is presently not felt to be a candidate for either coumadin or ASA.  Today, he denies symptoms of palpitations, chest pain, shortness of breath, orthopnea, PND, lower extremity edema, dizziness, presyncope, syncope, or neurologic sequela.  The patient feels that he is tolerating medications without difficulties and is otherwise without complaint today.   Past Medical History  Diagnosis Date  . Paroxysmal atrial fibrillation     s/p PVI 04/09/09 and 10/17/10  . Atrial flutter     s/p afib and atrial flutter ablation 04/09/09;  a.  DCCV 07/2009;  b   Redo Ablation 10/16/2009  . Colitis, ischemic     secondary to ebolism from afib 1/10  . HTN (hypertension)   . Migraines   . Gout   . Rosacea   . Cancer     basal cell CA removed  . Tuberculosis     s/p treatment 1964  . Constipation   . Intracranial bleed 04/10/10    Right occipital hematoma with a left homonymous hemianopsia   . Hyperlipidemia   . BPH (benign prostatic hyperplasia)    Past Surgical History  Procedure Date  . Appendectomy   . Inguinal hernia repair   . Tonsillectomy   . Vasectomy   . Ablation 04/09/2009    s/p afib and atrial flutter ablation by [facility]  . Ablation of dysrhythmic focus 10/15/09    repeat afib ablation by [facility]    Current Outpatient Prescriptions  Medication Sig Dispense Refill  . acetaminophen (TYLENOL) 325 MG tablet Take 650 mg by mouth every 6 (six) hours as needed.        Marland Kitchen allopurinol (ZYLOPRIM) 100 MG tablet Take 100 mg by mouth at bedtime.        . Ascorbic Acid (VITAMIN C) 1000  MG tablet Take 2,000 mg by mouth daily.        Marland Kitchen diltiazem (TIAZAC) 240 MG 24 hr capsule Take 240 mg by mouth daily.        Marland Kitchen lisinopril (PRINIVIL,ZESTRIL) 5 MG tablet Take 5 mg by mouth daily.        Marland Kitchen LORazepam (ATIVAN) 0.5 MG tablet Take 0.5 mg by mouth. 1 tablet by mouth 1-2 times daily as needed.       . NON FORMULARY Stool softener 100 mg... Take 1 capsule by mouth once daily.       . pantoprazole (PROTONIX) 40 MG tablet Take 1 tablet (40 mg total) by mouth daily.  30 tablet  6  . rosuvastatin (CRESTOR) 10 MG tablet Take 10 mg by mouth daily.        Marland Kitchen DISCONTD: HYDROcodone-acetaminophen (VICODIN) 5-500 MG per tablet Take 1 tablet by mouth as needed.        Marland Kitchen DISCONTD: pantoprazole (PROTONIX) 40 MG tablet Take 40 mg by mouth daily.        Marland Kitchen DISCONTD: aspirin 81 MG tablet Take 81 mg by mouth daily.        Marland Kitchen DISCONTD: indomethacin (INDOCIN) 25 MG capsule Take 25 mg by mouth. As needed for gout       .  DISCONTD: warfarin (COUMADIN) 3 MG tablet Take 3 mg by mouth as directed.          No Known Allergies  History   Social History  . Marital Status: Married    Spouse Name: N/A    Number of Children: N/A  . Years of Education: N/A   Occupational History  . Not on file.   Social History Main Topics  . Smoking status: Former Games developer  . Smokeless tobacco: Not on file  . Alcohol Use: No  . Drug Use: Not on file  . Sexually Active: Not on file   Other Topics Concern  . Not on file   Social History Narrative   Pt lives in [place].    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  He is married and lives at home with his wife.  He used to smoke but quit  smoking many years ago.  Does get regular exercise.  No alcohol use.     Family History  Problem Relation Age of Onset  . Breast cancer Sister   . Liver cancer Father   . Prostate cancer Father   . Coronary artery disease Father   . COPD Mother    Physical Exam: Filed Vitals:   04/30/10 1526    BP: 130/80  Pulse: 69  Height: 5\' 10"  (1.778 m)  Weight: 167 lb (75.751 kg)    GEN- The patient is well appearing, alert and oriented x 3 today.   Head- normocephalic, atraumatic Eyes-  Sclera clear, conjunctiva pink Ears- hearing intact Oropharynx- clear Neck- supple, no JVP Lymph- no cervical lymphadenopathy Lungs- Clear to ausculation bilaterally, normal work of breathing Heart- Regular rate and rhythm, no murmurs, rubs or gallops, PMI not laterally displaced GI- soft, NT, ND, + BS Extremities- no clubbing, cyanosis, or edema MS- no significant deformity or atrophy Skin- no rash or lesion Psych- euthymic mood, full affect Neuro- strength and sensation are intact  EKG- sinus rhythm 69 bpm, otherwise normal ekg  Assessment and Plan:

## 2010-04-30 NOTE — Patient Instructions (Signed)
Your physician recommends that you schedule a follow-up appointment in: 6 months with Dr.Allred.  

## 2010-05-14 ENCOUNTER — Other Ambulatory Visit: Payer: Self-pay | Admitting: Neurosurgery

## 2010-05-14 DIAGNOSIS — H5347 Heteronymous bilateral field defects: Secondary | ICD-10-CM

## 2010-05-20 NOTE — Consult Note (Signed)
Dean Pratt, Dean Pratt                ACCOUNT NO.:  [id number]   MEDICAL RECORD NO.:  [id number]          PATIENT TYPE:  INP   LOCATION:  4714                         FACILITY:  [facility]   PHYSICIAN:  Georga Hacking, M.D.DATE OF BIRTH:  [birth year]   DATE OF CONSULTATION:  02/29/2008  DATE OF DISCHARGE:                                 CONSULTATION   I was asked to see this 75 year old male for evaluation of recurrent  atrial fibrillation.  The patient has a longstanding history of  paroxysmal atrial fibrillation that he has been seen for since the mid  1990s.  He has had paroxysmal fibrillation and has been asymptomatic  otherwise.  He was noted to have worsening of atrial fibrillation and  wore a continuous telemetry monitor in the fall of 2008 that showed him  to be in atrial fibrillation about 30% at the time.  At that point he  had an echocardiogram that showed a left atrial size of 43 mm and normal  ventricular function.  He had [place] score of 1 and we opted to treat him  with warfarin anticoagulation at that time.  He has been therapeutic  since that time and actually had an INR of 3 on February 10, 2008.  He  developed some bronchitis and was treated with some antibiotics.  He was  out working cleaning up some limbs in gutters and had the onset of acute  right lower quadrant pain and was brought to the emergency room, where a  subsequent evaluation has revealed a thickened descending colon and  there has been a question of whether this is cancer or ischemia.  He was  noted to be in atrial fibrillation when he arrived.  INR today was 1.7.  We do not know what the INR was on admission.  He does not have any  symptoms of shortness of breath or chest pain suggestive of angina and  is not currently having edema.   PAST MEDICAL HISTORY:  Remarkable for a history of a duodenal ulcer in  1961, colon polyps, hypertension, gout and a history of Peyronie  disease.   PAST SURGICAL HISTORY:   Appendectomy, inguinal herniorrhaphy on the  left, tonsillectomy and vasectomy.   ALLERGIES:  NONE KNOWN.   CURRENT MEDICATIONS:  Included digoxin, flecainide, and he previously  was on diltiazem 240 mg.  He is also treated with Crestor and Micardis.  Has been maintained on therapeutic warfarin anticoagulation.   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  He is  married and lives at home with his wife.  He used to smoke but quit  smoking many years ago.  Does get regular exercise.  No alcohol use.   FAMILY HISTORY:  Father died of prostate cancer and coronary artery  disease at age 21.  Mother died of COPD at age 9.  Brother and a sister  are living.   REVIEW OF SYSTEMS:  His weight has been stable.  He has normally no eye,  ear, nose or throat complaints.  He does not have much  in the way of  shortness of breath but has had some recent bronchitis.  Denies chest  pain, PND, orthopnea or edema as noted above.  Abdominal symptoms as  noted above.  He has had some mild diarrhea and 1 episode of some  vomiting.  Denies severe arthritis.  Has some mild hesitancy.  Does have  a history of Peyronie disease.   On examination he is a pleasant male appearing stated age.  VITAL SIGNS:  Show a blood pressure of 97/60, oxygen saturation was 96%,  pulse was 78, irregular.  SKIN:  Warm and dry.  ENT:  EOMI.  PERRLA.  CNS clear.  Fundi not examined.  Pharynx negative.  NECK:  Without masses, JVD, thyromegaly or bruits.  CARDIOVASCULAR EXAM:  Irregular rhythm.  Normal S1 and S2.  No S3.  ABDOMEN:  Soft, with mild tenderness in the right lower quadrant.  Pulses were present and were 2+.  No edema noted.   A 12-lead EKG shows an IV conduction delay, nonspecific ST and T-wave  changes in the lateral leads.  There was no chest x-ray done on  admission.   IMPRESSION:  1. Paroxysmal atrial fibrillation with recurrence.  2. Lower abdominal pain, questionable ischemic colitis  due to embolus      from atrial fibrillation.  Although he has had therapeutic      anticoagulation in the past, it is unclear whether he was      therapeutic on admission or not.  3. Hypertension, treated.  4. Chronic warfarin anticoagulation.   RECOMMENDATIONS:  He is currently on IV heparin for possible suspected  embolus.  His white count has resolved somewhat.  My recommendations  would be to continue anticoagulation and restart his flecainide.  I  would check an echocardiogram.  His [place] score is low but if this is  deemed to be embolic, he will need to have a higher INR.      Georga Hacking, M.D.  Electronically Signed     WST/MEDQ  D:  02/29/2008  T:  02/29/2008  Job:  [id number]   cc:   Gwen Pounds, MD

## 2010-05-20 NOTE — H&P (Signed)
NAME:  Dean Pratt, Dean Pratt NO.:  [id number]   MEDICAL RECORD NO.:  [id number]          PATIENT TYPE:  INP   LOCATION:  4714                         FACILITY:  [facility]   PHYSICIAN:  Gwen Pounds, MD       DATE OF BIRTH:  1935-01-15   DATE OF ADMISSION:  02/28/2008  DATE OF DISCHARGE:                              HISTORY & PHYSICAL   PRIMARY CARE Day Greb:  Gwen Pounds, MD.   PRIMARY GASTROENTEROLOGIST:  Griffith Citron, MD   CHIEF COMPLAINT:  Abdominal pain.   This is a 75 year old male with history of atrial fibrillation and  history of multiple polypectomies.  He has been getting colonoscopies on  a q.2-year basis.  Last 2 were on February 2006, then March 2008 where  he has had several polyps removed, none of them high risk.  He saw me in  the office on February 13, 2008 with cough, sputum, congestion compatible  with bronchitis.  He received a 10-day course of Augmentin and got  better.  He has been doing well since until Sunday prior to admission.  Of which case, he started developing some abdominal pain, nausea,  vomiting.  No diarrhea.  He has been able to have bowel movements  despite this.  He has had one bowel movement that looked like it was  dark, but has not seen blood or anything that resembles coffee ground.  The pain got so severe today that he was urged to come on to the  emergency department.  The pain was described as diffuse umbilical,  right greater than left.  The ER physician, Elijah Birk, worked him up, gave him  Dilaudid, Zofran, Rocephin (for possible UTI), and IV fluids.  I was  called for inpatient admission.  By that time I got this p.m., he was  already feeling much better.   PAST MEDICAL HISTORY:  1. Paroxysmal atrial fibrillation.  2. Tuberculosis back in 1964.  He resided in a sanatorium and was      given INH for 6 months.  He has been tuberculosis free ever since      then.  3. Anxiety.  4. Chronic constipation.  5. History of  polypectomy.  6. Basal cell carcinoma, skin cancer removals.  7. BPH.  8. Hyperlipidemia.  9. Hypertension.  10.Right eye, laser eye surgery, done in [facility] in 2003.  11.Gout.  12.Rosacea and history of moles.  13.History of migraines with aura.   ALLERGIES:  No known drug allergies.   MEDICATIONS:  1. Flecainide 100 mg one and half tablet p.o. b.i.d.  2. Lanoxin 250 mcg p.o. daily.  3. Coumadin as directed to keep INR between 2 and 3.  4. Doxycycline 50 mg p.o. daily.  5. Allopurinol 100 mg p.o. at bedtime.  6. Crestor 10 mg p.o. daily.  7. Micardis 40 mg daily.  8. Aspirin 81 mg daily.  9. Indomethacin 20 mg p.r.n.  10.Lorazepam 0.5 mg p.o. daily.  11.Vitamin C 1000 mg p.o. daily.  12.Diltiazem XR 240 daily.   SOCIAL HISTORY:  He resides with his wife.  He is accompanied by her and  1 daughter in the emergency room.  He has 1 child and 1 grandchild.  He  does not smoke and does not drink.   FAMILY HISTORY:  Prostate cancer, AFib, coronary artery disease, and  lung cancer.   REVIEW OF SYSTEMS:  Please see HPI.  Denies any chest pain.  Denies any  abdominal pain.  Denies any shortness of breath.  He denies any urinary  issues or dysuria.  He denies any lower extremity issues.  His main  issues are abdominal.  There is no coughing or congestion.  His upper  respiratory infection has resolved.  All other organ systems reviewed  and negative.   PHYSICAL EXAMINATION:  VITAL SIGNS:  Temperature 97.0, blood pressure  100/60, heart rate 59-96 and is irregular, respiratory rate 18, and  sating 96% on room air.  GENERAL:  Alert and oriented x3.  NECK:  No JVD.  No lymphadenopathy.  Oropharynx is moist.  CHEST:  Clear to auscultation bilaterally.  CARDIAC:  Irregular.  ABDOMEN:  Distended, protuberant tender to deep palpitation with mild  guarding.  No rebound.  EXTREMITIES:  No edema noted.  The patient is able to walk.   ANCILLARY DATA:  White count 23.6, hemoglobin 16.5, and  platelet count  219.  Sodium 135, potassium 3.9, chloride 103, bicarb 24, BUN 14,  creatinine 1.29, and glucose 107, lipase 20.  Liver test are normal.  Urinalysis is slightly dirty with many bacteria and 3-6 white blood  cells per high-power field.  Telemetry monitoring shows what appeared at  first to be a bunch of normal beats with P waves taken over by AFib with  rate control with multiple irregularity.  CT scan of the abdomen and  pelvis showed liver cysts, gallbladder thick and contracted, bilateral  kidney cysts, a large mass measured 7.8 x 6.1 x 5.3 and was compatible  with colon neoplasm versus colitis versus inflammation with multiple  small lymph nodes surrounding and if this is cancer, there is no  evidence of this to be metastatic disease.   ASSESSMENT:  This is a 75 year old guy with 2 recent colonoscopies with  multiple polyps, status post polypectomy with no high-grade lesions on  either path reports who presents tonight with 2-3 days of abdominal  pain, some nausea and vomiting without diarrhea, and one case of  potential melena.  The patient will need to be admitted, rule out  cancer, rule out colitis.   PLAN:  1. Admit.  2. Check a CEA level.  3. Rule out colon cancer.  4. GI has been consulted, unfortunately his primary gastroenterologist      is Dr. Kinnie Scales.  Dr. Kinnie Scales does not come to the hospital and Dr.      Kinnie Scales is out of town and does not have any one who takes call for      him.  Therefore, I am going to pull favors and try to get a      gastroenterologist from [facility] who is to see him.  I just got a      phone with Dr. Yancey Flemings who will see the patient tonight or have      Dr. Leone Payor see him tomorrow morning and decide on colonoscopy.      Bowel rest.  Via the phone call, it is really possible that this is      C. diff colitis versus just a plain old ischemic colitis.  This is  kind of what we were hoping at this moment due to the fact that  we      do not want this patient to have any underlying colon cancer.  I      did call Dr. Jennye Boroughs office number and it says that he is out of      the office until at least March 05, 2008.  I was able to get the old      colonoscopy reports by calling over to [facility].  5. Recent antibiotics use.  We will give C. diff a definite      possibility.  6. We will treat his abdominal pain and continue on the Dilaudid and      Zofran at this current time.  Because of the distention and      increased white blood cell count and if he continues to feels that      way, may need a surgical evaluation and may need a surgery      especially if this is a mass.  We will wait for a GI to give some      input.  7. AFib.  Currently rate controlled, is on digoxin.  We will check a      dig level.  He is also on flecainide.  I do not know how I can give      him the flecainide, diltiazem, and digoxin being that we are trying      keep his bowels relatively at rest.  I do not know how greater      absorption he is getting anyway.  We will make a determining factor      overnight.  8. PT/INR, on Coumadin.  Hold the Coumadin at this current time with      the possibility of colonoscopy and/or surgery.  9. Question of UTI.  I doubt that he actually has one, but will check      urine culture and continue the IV antibiotics that will be IV      antibiotics, the IV antibiotics will be for the possible colitis.  10.We will follow up on labs in the morning.      Gwen Pounds, MD  Electronically Signed     JMR/MEDQ  D:  02/29/2008  T:  02/29/2008  Job:  [id number]

## 2010-05-20 NOTE — Discharge Summary (Signed)
NAMEARMISTEAD, [name]                ACCOUNT NO.:  [id number]   MEDICAL RECORD NO.:  [id number]          PATIENT TYPE:  INP   LOCATION:  4714                         FACILITY:  [facility]   PHYSICIAN:  Gwen Pounds, MD       DATE OF BIRTH:  1935/03/07   DATE OF ADMISSION:  02/28/2008  DATE OF DISCHARGE:  03/05/2008                               DISCHARGE SUMMARY   PRIMARY CARE Stacee Earp:  Gwen Pounds, MD   DISCHARGE DIAGNOSES:  1. Ischemic colitis.  2. Atrial fibrillation.  3. 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  4. Hypertension/ulcerated and biopsied.  5. Four polyps in the right colon.  6. Internal hemorrhoids.  7. Long history of paroxysmal atrial fibrillation for which he is on      Coumadin and managed by Dr. Donnie Aho.  8. Tuberculosis in 1964, status post INH for 6 months.  9. History of anxiety.  10.Chronic constipation.  11.History of prior polypectomies.  12.Basal cell carcinoma, status post removal.  13.Benign prostatic hypertrophy.  14.Hyperlipidemia.  15.Hypertension.  16.Right laser eye surgery at [facility].  17.History of gout.  18.History of rosacea, per Dr. Lonni Fix.  19.History of migraines with aura.  20.Urinary tract infection.   DISCHARGE MEDICATION LIST:  1. Flecainide 150 mg p.o. b.i.d.  2. Lanoxin 250 mcg 1 p.o. daily.  3. Vitamin C 1000 mg p.o. b.i.d.  4. Lorazepam 0.5 mg p.o. daily p.r.n.  5. Aspirin 81 daily.  6. Crestor 10 mg p.o. daily.  7. Allopurinol 100 mg p.o. nightly.  8. Doxycycline 50 mg p.o. daily as prescribed prior for his rosacea.  9. Coumadin 3 mg one and half tablets p.o. daily.  10.Cardizem CD 240 mg p.o. daily.  11.Anusol-HC for hemorrhoids 2.5% b.i.d.   DISCHARGE PROCEDURES:  1. A CT scan of the abdomen and pelvis showed liver cysts, gallbladder      thickening, and contraction, bilateral kidney cysts, and 7.86 x 6.2      x 5.3 cecal mass compatible with neoplasm versus colitis versus      infection versus inflammatory change with small  lymphadenopathy      noted.  2. Colonoscopy with biopsy and snare polypectomy on March 02, 2008,      with findings compatible mass in the cecum, ulcerated ischemia      versus cancer versus both biopsied, 4 polyps in the right colon,      internal hemorrhoids, otherwise normal exam.  3. A 2-D echocardiogram without any source of emboli noted.  Ejection      fraction of 60%, left atrium dilatation, right atrium dilatation.   CONSULTATIONS:  GI and Surgery.   HISTORY OF PRESENT ILLNESS:  Briefly, Dean Pratt is a 75 year old  male with AFib and multiple polypectomies, has been followed by Dr.  Kinnie Scales, who regularly seems to get an acute 2-year colonoscopy for these  polyps.  He was seen my office on February 15, 2008, for cough, sputum,  upper airway infection diagnosed with bronchitis, given Augmentin.  He  finished the Augmentin on February 23, 2008.  He presented to medical  attention on February 28, 2008, with right lower quadrant pain, nausea,  and vomiting.  No diarrhea.  Normal bowel movements.  The pain was for  over 48 hours, it was diffuse, and it was umbilical.  In the ED, he was  given Dilaudid, Zofran, Rocephin, and IV fluids, started to feel a  little bit better, got a CT scan and the CT scan showed a right-sided  colitis and a right-sided cecal mass.  ER was concerned for a new  diagnosis of cancer.  His white count was 23.6.  All the rest of his  labs were normal.  Lipase was normal.  Urinalysis showed many bacteria  and a urinary tract infection.   His physical exam was compatible with irregularly irregular heart  rhythm.  Abdominal exam was bloated, tender, and there was some  guarding.  He was able to walk back and forth to the bathroom.   HOSPITAL COURSE:  Mr. [name] was admitted through an emergency  department with significant abdominal pains and CT scan findings  compatible with the mass.  The differential diagnosis being considered  was cancer  versus colitis versus some sort of inflammatory changes  versus C. difficile.  His last colonoscopy is with Dr. Kinnie Scales in January  2006 and March 2008, which showed there were multiple polyps and  multiple biopsies, nothing high grade.  We admitted him, got a CEA  level.  Fortunately, this came back normal.  Without the diarrhea and  the fact that this is right-sided issue, C diff was low on the  differential, but still needed to be considered.  He was continued on  the IV antibiotics and placed on IV fluids, placed n.p.o. and for his  AFib, he was continued on digoxin and flecainide and Dr. Donnie Aho was made  aware of the admission.  He normally is on Coumadin and Coumadin was  held and PT/INR was checked with the possibility of doing a colonoscopy  and/or surgery in the near future.  Because of the questionable urinary  tract infection, he was continued on his IV antibiotics.   GI and General Surgery consulted, and the overlying conclusion that his  abdominal pain, mass, and findings on CT scans were compatible with an  ischemic colitis.  His white count improved quickly and the feeling was  is the AFib probably threw off an emboli to his mesenteric arterial  supply causing the ischemia.  He was initially put on Lovenox and  eventually put on a heparin drip.  He slowly improved and did not need  any surgical intervention.  He eventually went for colonoscopy.  The  colonoscopy report as listed above and eventually, his pathology came  back as more compatible with ischemia.  After the colonoscopy, the  heparin was restarted and Coumadin was restarted and he stayed through  the weekend and so his INR was therapeutic.  A 2-D echocardiogram was  obtained, which did not show any source of emboli.  Dr. Donnie Aho was  involved.  Again, CEA level was 0.5.  Blood pressure remained low and he  remained off blood pressure medicines.  His white count reduced into the  7,000 or 8,000 range.   For  right-sided ischemic colitis, on March 05, 2008, he was doing  markedly better.  He was on anticoagulation and his INR was greater than  2.  He was hemodynamically stable and it was felt he was ready for  discharge.   For his Afib, he became rate  and rhythm controlled.  He is going to  continue Coumadin, continue the goal INR 2-3.  He will follow up with  Dr. Donnie Aho as directed to maintain this levels.  He is going to continue  on the Cardizem, the flecainide, and the digoxin.   He remained with good hypertensive control.  Again, as stated above, he  was discharged on March 05, 2008, off antibiotics, off IV fluids, off  heparin drip, and on appropriate outpatient medicines.  He will follow  up with me in short order and continue to follow labs.  He will follow  up with Dr. Donnie Aho to find if he is bouncing back and forth between AFib  and normal sinus rhythm and to maintain INR between 2 and 3.  He will  follow up with [facility] to repeat colonoscopy in about 88-month time to  follow up on the multiple polyps that were seen that were not touched  during the colonoscopy looking for the right cecal mass.  Again, the  right cecal mass turned out to be more of ischemic issue and there is no  high-grade dysplasia or malignancy identified.      Gwen Pounds, MD  Electronically Signed     JMR/MEDQ  D:  04/11/2008  T:  04/11/2008  Job:  [id number]   cc:   Iva Boop, MD,FACG  Griffith Citron, M.D.  Dr. Pricilla Handler, M.D.

## 2010-05-20 NOTE — Consult Note (Signed)
NAMELONIE, NEWSHAM                ACCOUNT NO.:  [id number]   MEDICAL RECORD NO.:  [id number]          PATIENT TYPE:  INP   LOCATION:  4714                         FACILITY:  [facility]   PHYSICIAN:  Angelia Mould. Derrell Lolling, M.D.DATE OF BIRTH:  14-Aug-1935   DATE OF CONSULTATION:  DATE OF DISCHARGE:                                 CONSULTATION   REQUESTING PHYSICIAN:  Gwen Pounds, MD   REASON FOR CONSULTATION:  Right colon mass with inflammatory change.   HISTORY OF PRESENT ILLNESS:  Mr. Odonell is a 75 year old male patient  with history of atrial fibrillation on Coumadin.  He presented to the ER  yesterday with complaints of right-sided abdominal pain, abrupt onset on  Sunday, and the pain never resolved.  His initial white count was  22,600.  CT was done that showed no obstructive process but with  significant inflammatory changes involving the right colon and the cecal  region.  There is a question of a mass or this could be a severe colitis  process.  He has been empirically placed on Rocephin and Flagyl.  Since  that time, his white count has decreased to 11,600, but he is still  continuing to complain of focal right-sided abdominal pain.  GI  evaluation has been done.  Further recommendations at this time would  continue antibiotics and bowel rest prior to proceeding with  colonoscopy.  Surgical consultation has been requested.   REVIEW OF SYSTEMS:  GASTROINTESTINAL:  No significant chronic GI issues.  The patient does report intermittent constipation but nothing severe.  No chronic abdominal pain.  No reflux.  No chronic diarrhea.   SOCIAL HISTORY:  No alcohol.  No tobacco.   FAMILY HISTORY:  Noncontributory.   ALLERGIES:  No known drug allergies.   MEDICATIONS AT HOME:  Benzonatate 200 mg daily, Augmentin, allopurinol,  lorazepam, doxycycline, Warfarin, flecainide.   PAST MEDICAL HISTORY:  1. Atrial fibrillation on Coumadin.  INR is subtherapeutic at      presentation.  2.  Anxiety disorder.  3. History of constipation.  4. History of prior polypectomies, benign masses.  5. BPH.  6. Dyslipidemia.  7. Hypertension.  8. Gout.  9. Migraines.   PAST SURGICAL HISTORY:  1. Right lower quadrant incision from appendectomy.  2. Left hernia repair with mesh, inguinal.  3. Cataract surgery.  4. Resection of skin moles.   PHYSICAL EXAMINATION:  GENERAL:  A pleasant male patient currently  complaining of continued right-sided abdominal pain, severe in nature.  VITAL SIGNS:  T-max is 99.7, BP is 97/60, pulse is 78 and irregular,  respirations are 20.  PSYCH:  The patient's affect is appropriate to current situation.  He is  alert and oriented x3.  NEUROLOGIC:  Cranial nerves II-XII are grossly intact.  He is moving all  extremities with no focal neurological deficits.  HEENT:  Eyes, sclerae slightly injected but nonicteric.  Ears, nose, and  throat, ears are symmetrical.  No otorrhea.  Nose is midline.  No  rhinorrhea.  Oral mucous membranes are pink and moist.  CHEST:  Bilateral lung sounds are clear  to auscultation and respiratory  effort is nonlabored.  He is on room air.  CARDIOVASCULAR:  Heart sounds are S1 and S2 without rubs, murmurs,  thrills, or gallops.  Pulse is irregular.  He demonstrates atrial  fibrillation on telemetry with ventricular rate of less than 100.  ABDOMEN:  Soft, nondistended.  Bowel sounds are present but diminished.  He is focally tender with guarding and rebound in the right middle  quadrant, level 10/10 with palpation.  This is just lateral to the  umbilicus.  He has a small umbilical hernia, which is soft and nontender  but does not reduce.  I am unable to locate any scarring from the stated  left inguinal or lower abdominal wall hernia repair with mesh.  He has a  right lower quadrant incision, consistent with prior open appendectomy.  No hernias noted here.  EXTREMITIES:  Symmetrical in appearance without cyanosis or  clubbing.   LABORATORY DATA:  White count 11,600, hemoglobin 13.8, platelets  187,000.  Sodium 138, potassium 4.0, CO2 27, glucose 106.  BUN 11,  creatinine 1.11.  INR is 1.3.  LFTs are normal.  CEA is pending.  Urinalysis is consistent with possible urinary tract infection.   IMPRESSION:  1. Right lower quadrant abdominal pain with inflammatory changes in      the right colon.  Differential includes:      a.     Mesenteric ischemia secondary to embolic source due to       atrial fibrillation.      b.     Colitis, infectious versus idiopathic.      c.     Neoplastic mass in patient with history of prior       polypectomies.  2. History of atrial fibrillation on Coumadin with subtherapeutic INR.  3. Hypertension, controlled.  4. status post appendectomy   PLAN:  1. Continue current medical therapy with above-stated antibiotics,      bowel rest, IV fluids, etc.  2. Dr. Derrell Lolling has discussed with Dr. Timothy Lasso who voiced his concerns      regarding possible ischemic etiology to the      abdominal symptoms.  The patient's INR is subtherapeutic despite      being on Coumadin at home.  Dr. Timothy Lasso has ordered IV heparin.  3. At some point, the patient will need eventual colonoscopy to      evaluate the area in the right colon.      Allison L. Rennis Harding, N.P.      Angelia Mould. Derrell Lolling, M.D.  Electronically Signed    ALE/MEDQ  D:  02/29/2008  T:  03/01/2008  Job:  [id number]   cc:   Gwen Pounds, MD  Iva Boop, MD,FACG

## 2010-05-20 NOTE — Op Note (Signed)
Dean Pratt, Dean Pratt                ACCOUNT NO.:  [id number]   MEDICAL RECORD NO.:  [id number]          PATIENT TYPE:  AMB   LOCATION:  DSC                          FACILITY:  [facility]   PHYSICIAN:  Currie Paris, M.D.DATE OF BIRTH:  1935/08/15   DATE OF PROCEDURE:  08/01/2008  DATE OF DISCHARGE:                               OPERATIVE REPORT   PREOPERATIVE DIAGNOSIS:  Right inguinal hernia.   POSTOPERATIVE DIAGNOSIS:  Right inguinal hernia. (indirect with a  sliding component).   PROCEDURE:  Right inguinal hernia repair with mesh.   SURGEON:  Currie Paris, MD   ANESTHESIA:  General.   CLINICAL HISTORY:  This is a 76 year old gentleman with a moderately  large left inguinal hernia that he wished to have repaired.   DESCRIPTION OF PROCEDURE:  The patient was seen in the holding area and  had no further questions.  We confirmed the surgery as noted above and I  initialed the right inguinal area.   The patient was taken to the operating room and after satisfactory  general anesthesia had been obtained the right inguinal area was  clipped, prepped and draped.  The time-out was performed.   I used 0.25% plain Marcaine to help with postop pain relief and injected  it all around the incision area and below the fascia near the anterior-  superior iliac spine.  Additional local was infiltrated during the  procedure as we got to deeper layers.   I made an inguinal incision and divided it down to the external oblique.  Bleeders were tied or coagulated.   The external oblique was opened in line of its fibers and the cord  dissected off the inguinal floor staying fairly medially.  The floor  appeared intact.  There was large amount of material coming through the  deep ring.   I was able to divide a little bit of the cremaster muscle and identify a  sac which was opened.  It was actually a very thin-walled and I  dissected this off the cord back towards the deep ring.  I did  notice  what appeared to be a bit of bladder fat as part of the medial wall of  the sac.  Once I had this well exposed I went ahead and closed the sac  with a pursestring of 2-0 silk taking care to stay out of bladder fat  and excised the excess sac.  This was all reduced down.   There was a bunch of fibrotic material coming in and stuck to the back  wall of the cord, I got all of it divided so that I had the cord fully  exposed for its entire length and good exposure of the inguinal floor.  Anything that looked like a blood vessel was tied or coagulated.  I made  sure everything was dry.   I then put a 2-0 Prolene medial to the deep ring to tighten the ring as  it was fairly patulous having had the large indirect sac come through.  I then put a piece of Atrium mesh which was cut  laterally to go around  the cord and sutured in in the usual fashion using a running 2-0 Prolene  starting at the pubic tubercle.  It lay well medial onto the internal  oblique.  I had identified and kept the ilioinguinal nerve on the cord  and made sure that it came through with the cord structures and was not  entangled in the mesh.   Laterally, the mesh was tacked down.  I then made sure again everything  was dry.  The incision was closed with 3-0 Vicryl to close the external  oblique and Scarpa's and 4-0 Monocryl subcuticular Dermabond on the  skin.   The patient tolerated the procedure well.  There were no operative  complications.  All counts were correct.      Currie Paris, M.D.  Electronically Signed     CJS/MEDQ  D:  08/01/2008  T:  08/01/2008  Job:  [id number]   cc:   Gwen Pounds, MD  Georga Hacking, M.D.

## 2010-06-10 ENCOUNTER — Ambulatory Visit
Admission: RE | Admit: 2010-06-10 | Discharge: 2010-06-10 | Disposition: A | Discharge status: Home / Self Care | Payer: No Typology Code available for payment source | Source: Ambulatory Visit | Attending: Neurosurgery | Admitting: Neurosurgery

## 2010-06-10 DIAGNOSIS — H5347 Heteronymous bilateral field defects: Secondary | ICD-10-CM

## 2010-07-25 ENCOUNTER — Emergency Department (HOSPITAL_COMMUNITY): Payer: No Typology Code available for payment source

## 2010-07-25 ENCOUNTER — Encounter (HOSPITAL_COMMUNITY): Payer: Self-pay | Admitting: Radiology

## 2010-07-25 ENCOUNTER — Emergency Department (HOSPITAL_COMMUNITY)
Admission: EM | Admit: 2010-07-25 | Discharge: 2010-07-25 | Disposition: A | Discharge status: Home / Self Care | Payer: No Typology Code available for payment source | Attending: Emergency Medicine | Admitting: Emergency Medicine

## 2010-07-25 DIAGNOSIS — R51 Headache: Secondary | ICD-10-CM | POA: Insufficient documentation

## 2010-07-25 DIAGNOSIS — I10 Essential (primary) hypertension: Secondary | ICD-10-CM | POA: Insufficient documentation

## 2010-07-25 DIAGNOSIS — Z8673 Personal history of transient ischemic attack (TIA), and cerebral infarction without residual deficits: Secondary | ICD-10-CM | POA: Insufficient documentation

## 2010-10-29 ENCOUNTER — Encounter: Payer: Self-pay | Admitting: Internal Medicine

## 2010-10-29 ENCOUNTER — Ambulatory Visit (INDEPENDENT_AMBULATORY_CARE_PROVIDER_SITE_OTHER): Payer: No Typology Code available for payment source | Admitting: Internal Medicine

## 2010-10-29 VITALS — BP 124/72 | HR 58 | Ht 70.0 in | Wt 168.8 lb

## 2010-10-29 DIAGNOSIS — I4891 Unspecified atrial fibrillation: Secondary | ICD-10-CM

## 2010-10-29 NOTE — Patient Instructions (Signed)
Your physician wants you to follow-up in: 9 months with Dr Allred You will receive a reminder letter in the mail two months in advance. If you don't receive a letter, please call our office to schedule the follow-up appointment.  

## 2010-10-29 NOTE — Progress Notes (Signed)
The patient presents today for routine electrophysiology followup.  He has done reasonably well since his afib ablation.  He has had no recurrence of afib.  Today, he denies symptoms of palpitations, chest pain, shortness of breath, orthopnea, PND, lower extremity edema, dizziness, presyncope, syncope, or neurologic sequela.  The patient feels that he is tolerating medications without difficulties and is otherwise without complaint today.   Past Medical History  Diagnosis Date  . Paroxysmal atrial fibrillation     s/p PVI 04/09/09 and 10/17/10  . Atrial flutter     s/p afib and atrial flutter ablation 04/09/09;  a.  DCCV 07/2009;  b   Redo Ablation 10/16/2009  . Colitis, ischemic     secondary to ebolism from afib 1/10  . HTN (hypertension)   . Migraines   . Gout   . Rosacea   . Cancer     basal cell CA removed  . Tuberculosis     s/p treatment 1964  . Constipation   . Intracranial bleed 04/10/10    Right occipital hematoma with a left homonymous hemianopsia   . Hyperlipidemia   . BPH (benign prostatic hyperplasia)    Past Surgical History  Procedure Date  . Appendectomy   . Inguinal hernia repair   . Tonsillectomy   . Vasectomy   . Ablation 04/09/2009    s/p afib and atrial flutter ablation by [facility]  . Ablation of dysrhythmic focus 10/15/09    repeat afib ablation by [facility]    Current Outpatient Prescriptions  Medication Sig Dispense Refill  . acetaminophen (TYLENOL) 325 MG tablet Take 650 mg by mouth every 6 (six) hours as needed.        Marland Kitchen allopurinol (ZYLOPRIM) 100 MG tablet Take 100 mg by mouth at bedtime.        . Ascorbic Acid (VITAMIN C) 1000 MG tablet Take 1,000 mg by mouth daily.       Marland Kitchen aspirin 81 MG tablet Take 81 mg by mouth daily.        Marland Kitchen diltiazem (TIAZAC) 240 MG 24 hr capsule Take 240 mg by mouth daily.        . hydrocodone-acetaminophen (LORCET-HD) 5-500 MG per capsule Take 1 capsule by mouth every 6 (six) hours as needed.        Marland Kitchen LORazepam (ATIVAN) 0.5 MG tablet Take  0.5 mg by mouth. 1 tablet by mouth 1-2 times daily as needed.       Marland Kitchen losartan (COZAAR) 25 MG tablet Take 25 mg by mouth daily.        . NON FORMULARY Stool softener 100 mg... Take 1 capsule by mouth once daily.       . pantoprazole (PROTONIX) 40 MG tablet Take 1 tablet (40 mg total) by mouth daily.  30 tablet  6  . rosuvastatin (CRESTOR) 10 MG tablet Take 10 mg by mouth daily.          No Known Allergies  History   Social History  . Marital Status: Married    Spouse Name: N/A    Number of Children: N/A  . Years of Education: N/A   Occupational History  . Not on file.   Social History Main Topics  . Smoking status: Former Games developer  . Smokeless tobacco: Not on file  . Alcohol Use: No  . Drug Use: Not on file  . Sexually Active: Not on file   Other Topics Concern  . Not on file   Social History Narrative  Pt lives in [place].    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  He is married and lives at home with his wife.  He used to smoke but quit  smoking many years ago.  Does get regular exercise.  No alcohol use.     Family History  Problem Relation Age of Onset  . Breast cancer Sister   . Liver cancer Father   . Prostate cancer Father   . Coronary artery disease Father   . COPD Mother    Physical Exam: Filed Vitals:   10/29/10 0854  BP: 124/72  Pulse: 58  Height: 5\' 10"  (1.778 m)  Weight: 168 lb 12.8 oz (76.567 kg)    GEN- The patient is well appearing, alert and oriented x 3 today.   Head- normocephalic, atraumatic Eyes-  Sclera clear, conjunctiva pink Ears- hearing intact Oropharynx- clear Neck- supple, no JVP Lymph- no cervical lymphadenopathy Lungs- Clear to ausculation bilaterally, normal work of breathing Heart- Regular rate and rhythm, no murmurs, rubs or gallops, PMI not laterally displaced GI- soft, NT, ND, + BS Extremities- no clubbing, cyanosis, or edema MS- no significant deformity or  atrophy Skin- no rash or lesion Psych- euthymic mood, full affect Neuro- strength and sensation are intact  EKG- sinus rhythm 58 bpm, otherwise normal ekg  Assessment and Plan:

## 2010-10-29 NOTE — Assessment & Plan Note (Signed)
Maintaining sinus rhythm post ablation off of AAD No changes today Not a candidate for coumadin, pradaxa, or xarelto  Return in 9 months.

## 2010-11-24 ENCOUNTER — Other Ambulatory Visit: Payer: Self-pay | Admitting: Internal Medicine

## 2011-01-16 ENCOUNTER — Other Ambulatory Visit: Payer: Self-pay | Admitting: Cardiology

## 2011-02-24 ENCOUNTER — Encounter: Payer: Self-pay | Admitting: Internal Medicine

## 2011-03-02 ENCOUNTER — Encounter: Payer: Self-pay | Admitting: Internal Medicine

## 2011-04-16 ENCOUNTER — Ambulatory Visit (AMBULATORY_SURGERY_CENTER): Payer: No Typology Code available for payment source | Admitting: *Deleted

## 2011-04-16 VITALS — Ht 70.0 in | Wt 172.0 lb

## 2011-04-16 DIAGNOSIS — Z1211 Encounter for screening for malignant neoplasm of colon: Secondary | ICD-10-CM

## 2011-04-16 MED ORDER — PEG-KCL-NACL-NASULF-NA ASC-C 100 G PO SOLR: ORAL | Status: DC

## 2011-04-30 ENCOUNTER — Encounter: Payer: Self-pay | Admitting: Internal Medicine

## 2011-04-30 ENCOUNTER — Ambulatory Visit (AMBULATORY_SURGERY_CENTER): Payer: No Typology Code available for payment source | Admitting: Internal Medicine

## 2011-04-30 VITALS — BP 135/92 | HR 58 | Temp 97.4°F | Resp 20 | Ht 70.0 in | Wt 172.0 lb

## 2011-04-30 DIAGNOSIS — D126 Benign neoplasm of colon, unspecified: Secondary | ICD-10-CM

## 2011-04-30 DIAGNOSIS — Z86010 Personal history of colonic polyps: Secondary | ICD-10-CM

## 2011-04-30 DIAGNOSIS — Z1211 Encounter for screening for malignant neoplasm of colon: Secondary | ICD-10-CM

## 2011-04-30 MED ORDER — SODIUM CHLORIDE 0.9 % IV SOLN: 500.0000 mL | INTRAVENOUS | Status: DC

## 2011-04-30 NOTE — Op Note (Signed)
[facility] [address] [place], [place]  [address]  COLONOSCOPY PROCEDURE REPORT  PATIENT:  Dean Pratt, Dean Pratt  MR#:  [id number] BIRTHDATE:  [birth year], 75 yrs. old  GENDER:  male ENDOSCOPIST:  Wilhemina Bonito. Eda Keys, MD REF. BY:  Surveillance Program Recall, PROCEDURE DATE:  04/30/2011 PROCEDURE:  Colonoscopy with snare polypectomy x 2 ASA CLASS:  Class II INDICATIONS:  history of pre-cancerous (adenomatous) colon polyps, surveillance and high-risk screening ; hx polyposis (>20 adenomas 2010); last exam 01-2010 w/ 2 small polyps MEDICATIONS:   MAC sedation, administered by CRNA, propofol (Diprivan) 100 mg IV  DESCRIPTION OF PROCEDURE:   After the risks benefits and alternatives of the procedure were thoroughly explained, informed consent was obtained.  Digital rectal exam was performed and revealed no abnormalities.   The LB [id number]-[id number] endoscope was introduced through the anus and advanced to the cecum, which was identified by both the appendix and ileocecal valve, without limitations.  The quality of the prep was excellent, using MoviPrep.  The instrument was then slowly withdrawn as the colon was fully examined. <<PROCEDUREIMAGES>>  FINDINGS:  Two diminutive polyps were found in the transverse colon and snared without cautery. Retrieval was successful.  Mild diverticulosis was found in the sigmoid colon.  Otherwise normal colonoscopy without other polyps, masses, vascular ectasias, or inflammatory changes.   Retroflexed views in the rectum revealed no abnormalities.    The time to cecum = 2:31  minutes. The scope was then withdrawn in  16:16  minutes from the cecum and the procedure completed.  COMPLICATIONS:  None  ENDOSCOPIC IMPRESSION: 1) Two Diminutive polyps in the transverse colon - removed 2) Mild diverticulosis in the sigmoid colon 3) Otherwise normal colonoscopy  RECOMMENDATIONS: 1) Repeat Colonoscopy in 3  years.  ______________________________ Wilhemina Bonito. Eda Keys, MD  CC:  [name], MD;  The Patient  n. eSIGNED:   Wilhemina Bonito. Eda Keys at 04/30/2011 10:15 AM  Nino Glow, [phone]

## 2011-04-30 NOTE — Progress Notes (Signed)
Propofol administered by S Camp CRNA 

## 2011-04-30 NOTE — Patient Instructions (Signed)
Discharge instructions given with verbal understanding. Handouts on polyps and diverticulosis given. Resume previous medications.YOU HAD AN ENDOSCOPIC PROCEDURE TODAY AT THE [facility]: Refer to the procedure report that was given to you for any specific questions about what was found during the examination.  If the procedure report does not answer your questions, please call your gastroenterologist to clarify.  If you requested that your care partner not be given the details of your procedure findings, then the procedure report has been included in a sealed envelope for you to review at your convenience later.  YOU SHOULD EXPECT: Some feelings of bloating in the abdomen. Passage of more gas than usual.  Walking can help get rid of the air that was put into your GI tract during the procedure and reduce the bloating. If you had a lower endoscopy (such as a colonoscopy or flexible sigmoidoscopy) you may notice spotting of blood in your stool or on the toilet paper. If you underwent a bowel prep for your procedure, then you may not have a normal bowel movement for a few days.  DIET: Your first meal following the procedure should be a light meal and then it is ok to progress to your normal diet.  A half-sandwich or bowl of soup is an example of a good first meal.  Heavy or fried foods are harder to digest and may make you feel nauseous or bloated.  Likewise meals heavy in dairy and vegetables can cause extra gas to form and this can also increase the bloating.  Drink plenty of fluids but you should avoid alcoholic beverages for 24 hours.  ACTIVITY: Your care partner should take you home directly after the procedure.  You should plan to take it easy, moving slowly for the rest of the day.  You can resume normal activity the day after the procedure however you should NOT DRIVE or use heavy machinery for 24 hours (because of the sedation medicines used during the test).    SYMPTOMS TO REPORT  IMMEDIATELY: A gastroenterologist can be reached at any hour.  During normal business hours, 8:30 AM to 5:00 PM Monday through Friday, call [phone].  After hours and on weekends, please call the GI answering service at [phone] who will take a message and have the physician on call contact you.   Following lower endoscopy (colonoscopy or flexible sigmoidoscopy):  Excessive amounts of blood in the stool  Significant tenderness or worsening of abdominal pains  Swelling of the abdomen that is new, acute  Fever of 100F or higher  Black, tarry-looking stools  FOLLOW UP: If any biopsies were taken you will be contacted by phone or by letter within the next 1-3 weeks.  Call your gastroenterologist if you have not heard about the biopsies in 3 weeks.  Our staff will call the home number listed on your records the next business day following your procedure to check on you and address any questions or concerns that you may have at that time regarding the information given to you following your procedure. This is a courtesy call and so if there is no answer at the home number and we have not heard from you through the emergency physician on call, we will assume that you have returned to your regular daily activities without incident.  SIGNATURES/CONFIDENTIALITY: You and/or your care partner have signed paperwork which will be entered into your electronic medical record.  These signatures attest to the fact that that the information above on  your After Visit Summary has been reviewed and is understood.  Full responsibility of the confidentiality of this discharge information lies with you and/or your care-partner.

## 2011-04-30 NOTE — Progress Notes (Signed)
Pt's heart rate sinus bradycardia, rate 43-55.

## 2011-04-30 NOTE — Progress Notes (Signed)
Patient did not experience any of the following events: a burn prior to discharge; a fall within the facility; wrong site/side/patient/procedure/implant event; or a hospital transfer or hospital admission upon discharge from the facility. (G8907) Patient did not have preoperative order for IV antibiotic SSI prophylaxis. (G8918)  

## 2011-05-01 ENCOUNTER — Telehealth: Payer: Self-pay

## 2011-05-01 NOTE — Telephone Encounter (Signed)
Left message on answering machine. 

## 2011-05-05 ENCOUNTER — Encounter: Payer: Self-pay | Admitting: Internal Medicine

## 2011-05-29 ENCOUNTER — Other Ambulatory Visit: Payer: Self-pay | Admitting: Cardiology

## 2011-08-03 ENCOUNTER — Ambulatory Visit (INDEPENDENT_AMBULATORY_CARE_PROVIDER_SITE_OTHER): Payer: Medicare Other | Admitting: Internal Medicine

## 2011-08-03 ENCOUNTER — Encounter: Payer: Self-pay | Admitting: Internal Medicine

## 2011-08-03 VITALS — BP 130/74 | HR 52 | Ht 70.0 in | Wt 175.0 lb

## 2011-08-03 DIAGNOSIS — I4891 Unspecified atrial fibrillation: Secondary | ICD-10-CM

## 2011-08-03 NOTE — Progress Notes (Signed)
PCP: Gwen Pounds, MD Primary Cardiologist:  Dr Donnie Aho  The patient presents today for routine electrophysiology followup.  Today, he denies symptoms of palpitations, chest pain, shortness of breath, orthopnea, PND, lower extremity edema, dizziness, presyncope, syncope, or neurologic sequela.  He denies afib.  The patient feels that he is tolerating medications without difficulties and is otherwise without complaint today.   Past Medical History  Diagnosis Date  . Paroxysmal atrial fibrillation     s/p PVI 04/09/09 and 10/17/10  . Atrial flutter     s/p afib and atrial flutter ablation 04/09/09  . Colitis, ischemic     secondary to ebolism from afib 1/10  . HTN (hypertension)   . Migraines   . Gout   . Rosacea   . Tuberculosis     s/p treatment 1964  . Constipation   . Intracranial bleed 04/10/10    Right occipital hematoma with a left homonymous hemianopsia   . Hyperlipidemia   . BPH (benign prostatic hyperplasia)   . Anxiety   . Cancer     basal cell CA removed  . GERD (gastroesophageal reflux disease)   . Stroke 04/08/2010    hemorragic, anticoagulation stopped at that time   Past Surgical History  Procedure Date  . Appendectomy   . Inguinal hernia repair   . Tonsillectomy   . Vasectomy   . Ablation 04/09/2009    s/p afib and atrial flutter ablation by [facility]  . Ablation of dysrhythmic focus 10/15/09    repeat afib ablation by [facility]  . Corneal laceration repair   . Colonoscopy   . Polypectomy     Current Outpatient Prescriptions  Medication Sig Dispense Refill  . acetaminophen (TYLENOL) 325 MG tablet Take 650 mg by mouth every 6 (six) hours as needed.        Marland Kitchen allopurinol (ZYLOPRIM) 100 MG tablet Take 100 mg by mouth at bedtime.        . Ascorbic Acid (VITAMIN C) 1000 MG tablet Take 1,000 mg by mouth daily.       Marland Kitchen aspirin 81 MG tablet Take 81 mg by mouth daily.        Marland Kitchen diltiazem (CARDIZEM CD) 240 MG 24 hr capsule Take 1 tablet by mouth daily.      . hydrocodone-acetaminophen  (LORCET-HD) 5-500 MG per capsule Take 1 capsule by mouth every 6 (six) hours as needed.        Marland Kitchen LORazepam (ATIVAN) 0.5 MG tablet Take 0.25 mg by mouth 2 (two) times daily. 1 tablet by mouth 1-2 times daily as needed.      Marland Kitchen losartan (COZAAR) 25 MG tablet Take 25 mg by mouth daily.        . NON FORMULARY Stool softener 100 mg... Take 1 capsule by mouth once daily.       . pantoprazole (PROTONIX) 40 MG tablet TAKE 1 TABLET (40 MG TOTAL) BY MOUTH DAILY.  30 tablet  8  . RESTASIS 0.05 % ophthalmic emulsion Place 1 drop into both eyes Twice daily.      . rosuvastatin (CRESTOR) 10 MG tablet Take 10 mg by mouth daily.          Allergies  Allergen Reactions  . Ace Inhibitors Cough  . Warfarin And Related     Intercranial bleed    History   Social History  . Marital Status: Married    Spouse Name: N/A    Number of Children: N/A  . Years of Education: N/A  Occupational History  . Not on file.   Social History Main Topics  . Smoking status: Former Games developer  . Smokeless tobacco: Never Used  . Alcohol Use: No  . Drug Use: No  . Sexually Active: Not on file   Other Topics Concern  . Not on file   Social History Narrative   Pt lives in [place].    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  He is married and lives at home with his wife.  He used to smoke but quit  smoking many years ago.  Does get regular exercise.  No alcohol use.     Family History  Problem Relation Age of Onset  . Breast cancer Sister   . Liver cancer Father   . Prostate cancer Father   . Coronary artery disease Father   . COPD Mother    Physical Exam: Filed Vitals:   08/03/11 0915  BP: 130/74  Pulse: 52  Height: 5\' 10"  (1.778 m)  Weight: 175 lb (79.379 kg)  SpO2: 96%    GEN- The patient is well appearing, alert and oriented x 3 today.   Head- normocephalic, atraumatic Eyes-  Sclera clear, conjunctiva pink Ears- hearing intact Oropharynx-  clear Neck- supple, no JVP Lymph- no cervical lymphadenopathy Lungs- Clear to ausculation bilaterally, normal work of breathing Heart- Regular rate and rhythm, no murmurs, rubs or gallops, PMI not laterally displaced GI- soft, NT, ND, + BS Extremities- no clubbing, cyanosis, or edema  EKG- sinus rhythm 52 bpm, otherwise normal ekg  Assessment and Plan:

## 2011-08-03 NOTE — Assessment & Plan Note (Signed)
No recurrence off of AAD No changes  Return in 1 year

## 2011-08-03 NOTE — Patient Instructions (Signed)
Your physician wants you to follow-up in: 12 months with Dr Allred You will receive a reminder letter in the mail two months in advance. If you don't receive a letter, please call our office to schedule the follow-up appointment.  

## 2011-08-25 ENCOUNTER — Other Ambulatory Visit: Payer: Self-pay | Admitting: Internal Medicine

## 2012-05-09 ENCOUNTER — Other Ambulatory Visit: Payer: Self-pay | Admitting: Emergency Medicine

## 2012-05-09 MED ORDER — PANTOPRAZOLE SODIUM 40 MG PO TBEC: DELAYED_RELEASE_TABLET | ORAL | Status: DC

## 2012-07-25 ENCOUNTER — Other Ambulatory Visit: Payer: Self-pay | Admitting: Internal Medicine

## 2012-07-25 NOTE — Telephone Encounter (Signed)
S/W pt's wife about the refill request for Protonix 40mg  & told her he needs an appt for further refills. Protonix 40mg  #30, R-0 sent to [facility]. Pt's wife aware.

## 2012-10-16 ENCOUNTER — Other Ambulatory Visit: Payer: Self-pay | Admitting: Internal Medicine

## 2012-11-14 ENCOUNTER — Other Ambulatory Visit: Payer: Self-pay | Admitting: Internal Medicine

## 2013-01-09 ENCOUNTER — Other Ambulatory Visit: Payer: Self-pay | Admitting: Internal Medicine

## 2013-01-12 ENCOUNTER — Encounter: Payer: Self-pay | Admitting: Internal Medicine

## 2013-01-12 ENCOUNTER — Ambulatory Visit (INDEPENDENT_AMBULATORY_CARE_PROVIDER_SITE_OTHER): Payer: Medicare HMO | Admitting: Internal Medicine

## 2013-01-12 VITALS — BP 134/78 | HR 58 | Ht 70.0 in | Wt 173.8 lb

## 2013-01-12 DIAGNOSIS — I4891 Unspecified atrial fibrillation: Secondary | ICD-10-CM

## 2013-01-12 DIAGNOSIS — IMO0002 Reserved for concepts with insufficient information to code with codable children: Secondary | ICD-10-CM

## 2013-01-12 NOTE — Patient Instructions (Signed)
Your physician wants you to follow-up in: 12 months with Dr. Allred. You will receive a reminder letter in the mail two months in advance. If you don't receive a letter, please call our office to schedule the follow-up appointment.  Your physician recommends that you continue on your current medications as directed. Please refer to the Current Medication list given to you today.  

## 2013-01-12 NOTE — Progress Notes (Signed)
PCP: Precious Reel, MD Primary Cardiologist:  Dr Arcola Jansky is a 78 y.o. male who presents today for routine electrophysiology followup.  Since last being seen in our clinic, the patient reports doing very well.  He has had no symptoms of afib.  Today, he denies symptoms of palpitations, chest pain, shortness of breath,  lower extremity edema, dizziness, presyncope, or syncope.  He has occasional reflux.  The patient is otherwise without complaint today.   Past Medical History  Diagnosis Date  . Paroxysmal atrial fibrillation     s/p PVI 04/09/09 and 10/17/10  . Atrial flutter     s/p afib and atrial flutter ablation 04/09/09  . Colitis, ischemic     secondary to ebolism from afib 1/10  . HTN (hypertension)   . Migraines   . Gout   . Rosacea   . Tuberculosis     s/p treatment 1964  . Constipation   . Intracranial bleed 04/10/10    Right occipital hematoma with a left homonymous hemianopsia   . Hyperlipidemia   . BPH (benign prostatic hyperplasia)   . Anxiety   . Cancer     basal cell CA removed  . GERD (gastroesophageal reflux disease)   . Stroke 04/08/2010    hemorragic, anticoagulation stopped at that time   Past Surgical History  Procedure Laterality Date  . Appendectomy    . Inguinal hernia repair    . Tonsillectomy    . Vasectomy    . Ablation  04/09/2009    s/p afib and atrial flutter ablation by [facility]  . Ablation of dysrhythmic focus  10/15/09    repeat afib ablation by [facility]  . Corneal laceration repair    . Colonoscopy    . Polypectomy      Current Outpatient Prescriptions  Medication Sig Dispense Refill  . acetaminophen (TYLENOL) 325 MG tablet Take 650 mg by mouth every 6 (six) hours as needed.        Marland Kitchen allopurinol (ZYLOPRIM) 100 MG tablet Take 100 mg by mouth at bedtime.        . Ascorbic Acid (VITAMIN C) 1000 MG tablet Take 1,000 mg by mouth daily.       Marland Kitchen aspirin 81 MG tablet Take 81 mg by mouth daily.        Marland Kitchen diltiazem (CARDIZEM CD) 240 MG 24 hr capsule  Take 1 tablet by mouth daily.      . hydrocodone-acetaminophen (LORCET-HD) 5-500 MG per capsule Take 1 capsule by mouth every 6 (six) hours as needed.        Marland Kitchen LORazepam (ATIVAN) 0.5 MG tablet Take 0.25 mg by mouth 2 (two) times daily. 1 tablet by mouth 1-2 times daily as needed.      Marland Kitchen losartan (COZAAR) 25 MG tablet Take 25 mg by mouth daily.        . NON FORMULARY Stool softener 100 mg... Take 1 capsule by mouth once daily.       . pantoprazole (PROTONIX) 40 MG tablet TAKE 1 TABLET (40 MG TOTAL) BY MOUTH DAILY.  30 tablet  0  . RESTASIS 0.05 % ophthalmic emulsion Place 1 drop into both eyes Twice daily.      . rosuvastatin (CRESTOR) 10 MG tablet Take 10 mg by mouth daily.         No current facility-administered medications for this visit.    Physical Exam: Filed Vitals:   01/12/13 1451  BP: 134/78  Pulse: 58  Height: 5'  10" (1.778 m)  Weight: 173 lb 12.8 oz (78.835 kg)    GEN- The patient is well appearing, alert and oriented x 3 today.   Head- normocephalic, atraumatic Eyes-  Sclera clear, conjunctiva pink Ears- hearing intact Oropharynx- clear Lungs- Clear to ausculation bilaterally, normal work of breathing Heart- Regular rate and rhythm, no murmurs, rubs or gallops, PMI not laterally displaced GI- soft, NT, ND, + BS Extremities- no clubbing, cyanosis, or edema  ekg today reveals sinus rhythm 58 bpm, otherwise normal ekg  Assessment and Plan:  1. afib No recurrence s/p ablation off of AAD Continue current regimen of diltiazem CHADS2VASC is 3.  However given prior ICH on coumadin, we will plan to avoid anticoagulation at this time.  2. HTN Stable No change required today  Follow-up with Dr Wynonia Lawman as scheduled I will see in a year

## 2013-10-22 ENCOUNTER — Telehealth: Payer: Self-pay | Admitting: Internal Medicine

## 2013-10-22 NOTE — Telephone Encounter (Signed)
I spoke with patient by phone to follow-up.  He is doing "great".  No afib and is off of AAD therapy.  He is enjoying taking art classes. I will see in January as scheduled.

## 2014-01-12 ENCOUNTER — Encounter: Payer: Self-pay | Admitting: Internal Medicine

## 2014-01-12 ENCOUNTER — Ambulatory Visit (INDEPENDENT_AMBULATORY_CARE_PROVIDER_SITE_OTHER): Payer: PPO | Admitting: Internal Medicine

## 2014-01-12 VITALS — BP 130/70 | HR 58 | Ht 70.0 in | Wt 173.4 lb

## 2014-01-12 DIAGNOSIS — I10 Essential (primary) hypertension: Secondary | ICD-10-CM

## 2014-01-12 DIAGNOSIS — I480 Paroxysmal atrial fibrillation: Secondary | ICD-10-CM

## 2014-01-12 NOTE — Patient Instructions (Signed)
Your physician wants you to follow-up in: 12 months with Dr Allred You will receive a reminder letter in the mail two months in advance. If you don't receive a letter, please call our office to schedule the follow-up appointment.  

## 2014-01-12 NOTE — Progress Notes (Signed)
PCP: Precious Reel, MD Primary Cardiologist:  Dr Arcola Jansky is a 79 y.o. male who presents today for routine electrophysiology followup.  Since last being seen in our clinic, the patient reports doing very well.  He has had no symptoms of afib.  Today, he denies symptoms of palpitations, chest pain, shortness of breath,  lower extremity edema, dizziness, presyncope, or syncope.  He has occasional reflux.  The patient is otherwise without complaint today.   Past Medical History  Diagnosis Date  . Paroxysmal atrial fibrillation     s/p PVI 04/09/09 and 10/17/10  . Atrial flutter     s/p afib and atrial flutter ablation 04/09/09  . Colitis, ischemic     secondary to ebolism from afib 1/10  . HTN (hypertension)   . Migraines   . Gout   . Rosacea   . Tuberculosis     s/p treatment 1964  . Constipation   . Intracranial bleed 04/10/10    Right occipital hematoma with a left homonymous hemianopsia   . Hyperlipidemia   . BPH (benign prostatic hyperplasia)   . Anxiety   . Cancer     basal cell CA removed  . GERD (gastroesophageal reflux disease)   . Stroke 04/08/2010    hemorragic, anticoagulation stopped at that time   Past Surgical History  Procedure Laterality Date  . Appendectomy    . Inguinal hernia repair    . Tonsillectomy    . Vasectomy    . Ablation  04/09/2009    s/p afib and atrial flutter ablation by [facility]  . Ablation of dysrhythmic focus  10/15/09    repeat afib ablation by [facility]  . Corneal laceration repair    . Colonoscopy    . Polypectomy      Current Outpatient Prescriptions  Medication Sig Dispense Refill  . acetaminophen (TYLENOL) 325 MG tablet Take 650 mg by mouth every 6 (six) hours as needed.      Marland Kitchen allopurinol (ZYLOPRIM) 100 MG tablet Take 100 mg by mouth at bedtime.      . Ascorbic Acid (VITAMIN C) 1000 MG tablet Take 1,000 mg by mouth daily.     Marland Kitchen aspirin 81 MG tablet Take 81 mg by mouth daily.      Marland Kitchen diltiazem (CARDIZEM CD) 240 MG 24 hr capsule Take 1  tablet by mouth daily.    . hydrocodone-acetaminophen (LORCET-HD) 5-500 MG per capsule Take 1 capsule by mouth every 6 (six) hours as needed.      Marland Kitchen LORazepam (ATIVAN) 0.5 MG tablet Take 0.25 mg by mouth 2 (two) times daily. 1 tablet by mouth 1-2 times daily as needed.    Marland Kitchen losartan (COZAAR) 25 MG tablet Take 25 mg by mouth daily.      . NON FORMULARY Stool softener 100 mg... Take 1 capsule by mouth once daily.     . pantoprazole (PROTONIX) 40 MG tablet TAKE 1 TABLET (40 MG TOTAL) BY MOUTH DAILY. 30 tablet 0  . rosuvastatin (CRESTOR) 10 MG tablet Take 10 mg by mouth daily.       No current facility-administered medications for this visit.    Physical Exam: Filed Vitals:   01/12/14 1532  BP: 130/70  Pulse: 58  Height: 5\' 10"  (1.778 m)  Weight: 173 lb 6 oz (78.642 kg)    GEN- The patient is well appearing, alert and oriented x 3 today.   Head- normocephalic, atraumatic Eyes-  Sclera clear, conjunctiva pink Ears- hearing intact Oropharynx-  clear Lungs- Clear to ausculation bilaterally, normal work of breathing Heart- Regular rate and rhythm, no murmurs, rubs or gallops, PMI not laterally displaced GI- soft, NT, ND, + BS Extremities- no clubbing, cyanosis, or edema  ekg today reveals sinus rhythm 58 bpm, otherwise normal ekg  Assessment and Plan:  1. afib No recurrence s/p ablation off of AAD Continue current regimen of diltiazem CHADS2VASC is 3.  However given prior ICH on coumadin, we will plan to avoid anticoagulation at this time.  2. HTN Stable No change required today  Follow-up with Dr Wynonia Lawman as scheduled I will see in a year

## 2014-03-01 ENCOUNTER — Ambulatory Visit (INDEPENDENT_AMBULATORY_CARE_PROVIDER_SITE_OTHER): Payer: PPO | Admitting: Podiatry

## 2014-03-01 ENCOUNTER — Encounter: Payer: Self-pay | Admitting: Podiatry

## 2014-03-01 VITALS — BP 142/76 | HR 64 | Resp 12

## 2014-03-01 DIAGNOSIS — L600 Ingrowing nail: Secondary | ICD-10-CM

## 2014-03-01 NOTE — Patient Instructions (Signed)

## 2014-03-01 NOTE — Progress Notes (Signed)
   Subjective:    Patient ID: Dean Pratt, male    DOB: [birth year], 79 y.o.   MRN: [id number]  HPI  PT STATED B/L LATERALSIDE OF THE GREAT TOENAIL BEEN HURTING 4 WEEKS. THE TOENAILS ARE LITTLE BETTER BUT WHEN PUTTING PRESSURE GET WORSE. TRIED TO KEEP THEM TRIM BUT NO HELP.  Review of Systems  All other systems reviewed and are negative.      Objective:   Physical Exam        Assessment & Plan:

## 2014-03-01 NOTE — Progress Notes (Signed)
Subjective:     Patient ID: Dean Pratt, male   DOB: Apr 15, 1935, 79 y.o.   MRN: [id number]  HPI patient presents with painful ingrown toenails of the lateral borders of both big toe with inability to cut them himself and states that he has tried to trim them and tried to work with them without results   Review of Systems  All other systems reviewed and are negative.      Objective:   Physical Exam  Constitutional: He is oriented to person, place, and time.  Cardiovascular: Intact distal pulses.   Musculoskeletal: Normal range of motion.  Neurological: He is oriented to person, place, and time.  Skin: Skin is warm.  Nursing note and vitals reviewed.  neurovascular status intact with muscle strength adequate and range of motion of the subtalar midtarsal joint within normal limits. Patient's noted to have incurvated hallux nail borders bilateral lateral border that are painful when pressed and does have good digital perfusion is well oriented 3. No other foot pathology was noted upon thorough examination     Assessment:     Painful ingrown toenail deformity hallux bilateral lateral borders    Plan:     H&P and conditions reviewed. I've recommended removal of the corners and I explained procedure and reviewed risk. Patient wants surgery and today I infiltrated 60 mg Xylocaine Marcaine mixture into each big toe remove the lateral corners exposed matrix and applied phenol 3 applications 30 seconds in each corner followed by alcohol lavage and sterile dressing. Gave instructions on soaks and reappoint

## 2014-03-09 ENCOUNTER — Encounter: Payer: Self-pay | Admitting: Internal Medicine

## 2014-04-16 ENCOUNTER — Ambulatory Visit (AMBULATORY_SURGERY_CENTER): Payer: Self-pay | Admitting: *Deleted

## 2014-04-16 VITALS — Ht 70.0 in | Wt 176.6 lb

## 2014-04-16 DIAGNOSIS — Z86010 Personal history of colonic polyps: Secondary | ICD-10-CM

## 2014-04-16 MED ORDER — MOVIPREP 100 G PO SOLR: 1.0000 | Freq: Once | ORAL | Status: DC

## 2014-04-16 NOTE — Progress Notes (Signed)
No egg or soy allergy No home 02 use No diet pills No issues with past sedation Declined emmi

## 2014-05-02 ENCOUNTER — Ambulatory Visit (AMBULATORY_SURGERY_CENTER): Payer: PPO | Admitting: Internal Medicine

## 2014-05-02 ENCOUNTER — Encounter: Payer: Self-pay | Admitting: Internal Medicine

## 2014-05-02 VITALS — BP 134/74 | HR 69 | Temp 96.4°F | Resp 16 | Ht 70.0 in | Wt 176.0 lb

## 2014-05-02 DIAGNOSIS — D129 Benign neoplasm of anus and anal canal: Secondary | ICD-10-CM

## 2014-05-02 DIAGNOSIS — D125 Benign neoplasm of sigmoid colon: Secondary | ICD-10-CM

## 2014-05-02 DIAGNOSIS — D128 Benign neoplasm of rectum: Secondary | ICD-10-CM

## 2014-05-02 DIAGNOSIS — Z86010 Personal history of colonic polyps: Secondary | ICD-10-CM | POA: Diagnosis not present

## 2014-05-02 DIAGNOSIS — D123 Benign neoplasm of transverse colon: Secondary | ICD-10-CM

## 2014-05-02 DIAGNOSIS — D122 Benign neoplasm of ascending colon: Secondary | ICD-10-CM

## 2014-05-02 MED ORDER — SODIUM CHLORIDE 0.9 % IV SOLN: 500.0000 mL | INTRAVENOUS | Status: DC

## 2014-05-02 NOTE — Patient Instructions (Signed)
YOU HAD AN ENDOSCOPIC PROCEDURE TODAY AT [facility]:   Refer to the procedure report that was given to you for any specific questions about what was found during the examination.  If the procedure report does not answer your questions, please call your gastroenterologist to clarify.  If you requested that your care partner not be given the details of your procedure findings, then the procedure report has been included in a sealed envelope for you to review at your convenience later.  YOU SHOULD EXPECT: Some feelings of bloating in the abdomen. Passage of more gas than usual.  Walking can help get rid of the air that was put into your GI tract during the procedure and reduce the bloating. If you had a lower endoscopy (such as a colonoscopy or flexible sigmoidoscopy) you may notice spotting of blood in your stool or on the toilet paper. If you underwent a bowel prep for your procedure, you may not have a normal bowel movement for a few days.  Please Note:  You might notice some irritation and congestion in your nose or some drainage.  This is from the oxygen used during your procedure.  There is no need for concern and it should clear up in a day or so.  SYMPTOMS TO REPORT IMMEDIATELY:   Following lower endoscopy (colonoscopy or flexible sigmoidoscopy):  Excessive amounts of blood in the stool  Significant tenderness or worsening of abdominal pains  Swelling of the abdomen that is new, acute  Fever of 100F or higher   For urgent or emergent issues, a gastroenterologist can be reached at any hour by calling [phone].   DIET: Your first meal following the procedure should be a small meal and then it is ok to progress to your normal diet. Heavy or fried foods are harder to digest and may make you feel nauseous or bloated.  Likewise, meals heavy in dairy and vegetables can increase bloating.  Drink plenty of fluids but you should avoid alcoholic beverages for 24  hours.  ACTIVITY:  You should plan to take it easy for the rest of today and you should NOT DRIVE or use heavy machinery until tomorrow (because of the sedation medicines used during the test).    FOLLOW UP: Our staff will call the number listed on your records the next business day following your procedure to check on you and address any questions or concerns that you may have regarding the information given to you following your procedure. If we do not reach you, we will leave a message.  However, if you are feeling well and you are not experiencing any problems, there is no need to return our call.  We will assume that you have returned to your regular daily activities without incident.  If any biopsies were taken you will be contacted by phone or by letter within the next 1-3 weeks.  Please call us at [phone] if you have not heard about the biopsies in 3 weeks.    SIGNATURES/CONFIDENTIALITY: You and/or your care partner have signed paperwork which will be entered into your electronic medical record.  These signatures attest to the fact that that the information above on your After Visit Summary has been reviewed and is understood.  Full responsibility of the confidentiality of this discharge information lies with you and/or your care-partner.  Polyps-handout given  Repeat colonoscopy in 3 years-2019.

## 2014-05-02 NOTE — Progress Notes (Signed)
Patient awakening,vss,report to rn 

## 2014-05-02 NOTE — Op Note (Signed)
[place]  [address] [place], [id number]   COLONOSCOPY PROCEDURE REPORT  PATIENT: Dean Pratt, Dean Pratt  MR#: [id number] BIRTHDATE: 1935/08/26 , 31  yrs. old GENDER: male ENDOSCOPIST: Eustace Quail, MD REFERRED NW:[id number] Program Recall PROCEDURE DATE:  05/02/2014 PROCEDURE:   Colonoscopy, surveillance and Colonoscopy with snare polypectomy x 9 First Screening Colonoscopy - Avg.  risk and is 50 yrs.  old or older - No.  Prior Negative Screening - Now for repeat screening. N/A  History of Adenoma - Now for follow-up colonoscopy & has been > or = to 3 yrs.  Yes hx of adenoma.  Has been 3 or more years since last colonoscopy. ASA CLASS:   Class III INDICATIONS:Surveillance due to prior colonic neoplasia and PH Colon Adenoma. Previous examinations with polyposis. 2010 (greater than 20 polyps), 2012 (2 polyps), 2013 (2 polyps). MEDICATIONS: Monitored anesthesia care and Propofol 350 mg IV  DESCRIPTION OF PROCEDURE:   After the risks benefits and alternatives of the procedure were thoroughly explained, informed consent was obtained.  The digital rectal exam revealed no abnormalities of the rectum.   The LB [id number]  endoscope was introduced through the anus and advanced to the cecum, which was identified by both the appendix and ileocecal valve. No adverse events experienced.   The quality of the prep was excellent. (MoviPrep was used)  The instrument was then slowly withdrawn as the colon was fully examined.  COLON FINDINGS: Nine polyps ranging from 2 to 41mm in size were found in the sigmoid colon, rectum, ascending colon, and transverse colon.  A polypectomy was performed with a cold snare.  The resection was complete, the polyp tissue was completely retrieved and sent to histology.   The examination was otherwise normal. Retroflexed views revealed no abnormalities. The time to cecum = 0.0 Withdrawal time = 23.2   The scope was withdrawn and  the procedure completed. COMPLICATIONS: There were no immediate complications.  ENDOSCOPIC IMPRESSION: 1.   Nine polyps were found in the colon; polypectomy was performed with a cold snare 2.   The examination was otherwise normal  RECOMMENDATIONS: 1. Repeat Colonoscopy in 3 years.  eSigned:  Eustace Quail, MD 05/02/2014 1:10 PM   cc: The Patient and [name], MD

## 2014-05-02 NOTE — Progress Notes (Signed)
Called to room to assist during endoscopic procedure.  Patient ID and intended procedure confirmed with present staff. Received instructions for my participation in the procedure from the performing physician.  

## 2014-05-03 ENCOUNTER — Telehealth: Payer: Self-pay | Admitting: *Deleted

## 2014-05-03 NOTE — Telephone Encounter (Signed)
  Follow up Call-  Call back number 05/02/2014  Post procedure Call Back phone  # [phone]     Patient questions:  Do you have a fever, pain , or abdominal swelling? No. Pain Score  0 *  Have you tolerated food without any problems? Yes.    Have you been able to return to your normal activities? Yes.    Do you have any questions about your discharge instructions: Diet   No. Medications  No. Follow up visit  No.  Do you have questions or concerns about your Care? No.  Actions: * If pain score is 4 or above: No action needed, pain <4.

## 2014-05-07 ENCOUNTER — Encounter: Payer: Self-pay | Admitting: Internal Medicine

## 2015-02-05 DIAGNOSIS — L570 Actinic keratosis: Secondary | ICD-10-CM | POA: Diagnosis not present

## 2015-02-05 DIAGNOSIS — X32XXXD Exposure to sunlight, subsequent encounter: Secondary | ICD-10-CM | POA: Diagnosis not present

## 2015-02-05 DIAGNOSIS — L080 Pyoderma: Secondary | ICD-10-CM | POA: Diagnosis not present

## 2015-02-05 DIAGNOSIS — L814 Other melanin hyperpigmentation: Secondary | ICD-10-CM | POA: Diagnosis not present

## 2015-02-05 DIAGNOSIS — L821 Other seborrheic keratosis: Secondary | ICD-10-CM | POA: Diagnosis not present

## 2015-02-05 DIAGNOSIS — D225 Melanocytic nevi of trunk: Secondary | ICD-10-CM | POA: Diagnosis not present

## 2015-02-05 DIAGNOSIS — L820 Inflamed seborrheic keratosis: Secondary | ICD-10-CM | POA: Diagnosis not present

## 2015-02-06 DIAGNOSIS — M47817 Spondylosis without myelopathy or radiculopathy, lumbosacral region: Secondary | ICD-10-CM | POA: Diagnosis not present

## 2015-02-06 DIAGNOSIS — M5116 Intervertebral disc disorders with radiculopathy, lumbar region: Secondary | ICD-10-CM | POA: Diagnosis not present

## 2015-02-13 DIAGNOSIS — M5116 Intervertebral disc disorders with radiculopathy, lumbar region: Secondary | ICD-10-CM | POA: Diagnosis not present

## 2015-02-25 DIAGNOSIS — E038 Other specified hypothyroidism: Secondary | ICD-10-CM | POA: Diagnosis not present

## 2015-02-25 DIAGNOSIS — M109 Gout, unspecified: Secondary | ICD-10-CM | POA: Diagnosis not present

## 2015-02-25 DIAGNOSIS — E784 Other hyperlipidemia: Secondary | ICD-10-CM | POA: Diagnosis not present

## 2015-02-25 DIAGNOSIS — I10 Essential (primary) hypertension: Secondary | ICD-10-CM | POA: Diagnosis not present

## 2015-02-28 ENCOUNTER — Encounter: Payer: Self-pay | Admitting: Internal Medicine

## 2015-03-05 DIAGNOSIS — R05 Cough: Secondary | ICD-10-CM | POA: Diagnosis not present

## 2015-03-05 DIAGNOSIS — I10 Essential (primary) hypertension: Secondary | ICD-10-CM | POA: Diagnosis not present

## 2015-03-05 DIAGNOSIS — I480 Paroxysmal atrial fibrillation: Secondary | ICD-10-CM | POA: Diagnosis not present

## 2015-03-05 DIAGNOSIS — I129 Hypertensive chronic kidney disease with stage 1 through stage 4 chronic kidney disease, or unspecified chronic kidney disease: Secondary | ICD-10-CM | POA: Diagnosis not present

## 2015-03-05 DIAGNOSIS — R809 Proteinuria, unspecified: Secondary | ICD-10-CM | POA: Diagnosis not present

## 2015-03-05 DIAGNOSIS — N182 Chronic kidney disease, stage 2 (mild): Secondary | ICD-10-CM | POA: Diagnosis not present

## 2015-03-05 DIAGNOSIS — Z0000 Encounter for general adult medical examination without abnormal findings: Secondary | ICD-10-CM | POA: Diagnosis not present

## 2015-03-05 DIAGNOSIS — Z6824 Body mass index (BMI) 24.0-24.9, adult: Secondary | ICD-10-CM | POA: Diagnosis not present

## 2015-03-05 DIAGNOSIS — F418 Other specified anxiety disorders: Secondary | ICD-10-CM | POA: Diagnosis not present

## 2015-03-05 DIAGNOSIS — Z1389 Encounter for screening for other disorder: Secondary | ICD-10-CM | POA: Diagnosis not present

## 2015-03-05 DIAGNOSIS — H53462 Homonymous bilateral field defects, left side: Secondary | ICD-10-CM | POA: Diagnosis not present

## 2015-03-05 DIAGNOSIS — H6123 Impacted cerumen, bilateral: Secondary | ICD-10-CM | POA: Diagnosis not present

## 2015-03-12 DIAGNOSIS — C44310 Basal cell carcinoma of skin of unspecified parts of face: Secondary | ICD-10-CM | POA: Diagnosis not present

## 2015-03-12 DIAGNOSIS — C44319 Basal cell carcinoma of skin of other parts of face: Secondary | ICD-10-CM | POA: Diagnosis not present

## 2015-04-09 DIAGNOSIS — Z08 Encounter for follow-up examination after completed treatment for malignant neoplasm: Secondary | ICD-10-CM | POA: Diagnosis not present

## 2015-04-09 DIAGNOSIS — Z85828 Personal history of other malignant neoplasm of skin: Secondary | ICD-10-CM | POA: Diagnosis not present

## 2015-06-25 DIAGNOSIS — E785 Hyperlipidemia, unspecified: Secondary | ICD-10-CM | POA: Diagnosis not present

## 2015-06-25 DIAGNOSIS — I10 Essential (primary) hypertension: Secondary | ICD-10-CM | POA: Diagnosis not present

## 2015-06-25 DIAGNOSIS — I480 Paroxysmal atrial fibrillation: Secondary | ICD-10-CM | POA: Diagnosis not present

## 2015-06-25 DIAGNOSIS — I612 Nontraumatic intracerebral hemorrhage in hemisphere, unspecified: Secondary | ICD-10-CM | POA: Diagnosis not present

## 2015-09-03 DIAGNOSIS — Z23 Encounter for immunization: Secondary | ICD-10-CM | POA: Diagnosis not present

## 2015-09-03 DIAGNOSIS — I129 Hypertensive chronic kidney disease with stage 1 through stage 4 chronic kidney disease, or unspecified chronic kidney disease: Secondary | ICD-10-CM | POA: Diagnosis not present

## 2015-09-03 DIAGNOSIS — D692 Other nonthrombocytopenic purpura: Secondary | ICD-10-CM | POA: Diagnosis not present

## 2015-09-03 DIAGNOSIS — I480 Paroxysmal atrial fibrillation: Secondary | ICD-10-CM | POA: Diagnosis not present

## 2015-09-03 DIAGNOSIS — N182 Chronic kidney disease, stage 2 (mild): Secondary | ICD-10-CM | POA: Diagnosis not present

## 2015-09-03 DIAGNOSIS — R21 Rash and other nonspecific skin eruption: Secondary | ICD-10-CM | POA: Diagnosis not present

## 2015-09-03 DIAGNOSIS — F1320 Sedative, hypnotic or anxiolytic dependence, uncomplicated: Secondary | ICD-10-CM | POA: Diagnosis not present

## 2015-09-03 DIAGNOSIS — E038 Other specified hypothyroidism: Secondary | ICD-10-CM | POA: Diagnosis not present

## 2015-09-03 DIAGNOSIS — Z6824 Body mass index (BMI) 24.0-24.9, adult: Secondary | ICD-10-CM | POA: Diagnosis not present

## 2015-10-08 DIAGNOSIS — D225 Melanocytic nevi of trunk: Secondary | ICD-10-CM | POA: Diagnosis not present

## 2015-10-08 DIAGNOSIS — L821 Other seborrheic keratosis: Secondary | ICD-10-CM | POA: Diagnosis not present

## 2015-10-08 DIAGNOSIS — B355 Tinea imbricata: Secondary | ICD-10-CM | POA: Diagnosis not present

## 2015-12-03 ENCOUNTER — Other Ambulatory Visit (INDEPENDENT_AMBULATORY_CARE_PROVIDER_SITE_OTHER): Payer: Self-pay | Admitting: Orthopaedic Surgery

## 2015-12-03 NOTE — Telephone Encounter (Signed)
Please advise 

## 2015-12-24 DIAGNOSIS — I612 Nontraumatic intracerebral hemorrhage in hemisphere, unspecified: Secondary | ICD-10-CM | POA: Diagnosis not present

## 2015-12-24 DIAGNOSIS — I10 Essential (primary) hypertension: Secondary | ICD-10-CM | POA: Diagnosis not present

## 2015-12-24 DIAGNOSIS — E785 Hyperlipidemia, unspecified: Secondary | ICD-10-CM | POA: Diagnosis not present

## 2015-12-24 DIAGNOSIS — I480 Paroxysmal atrial fibrillation: Secondary | ICD-10-CM | POA: Diagnosis not present

## 2016-01-08 DIAGNOSIS — H35371 Puckering of macula, right eye: Secondary | ICD-10-CM | POA: Diagnosis not present

## 2016-01-08 DIAGNOSIS — H26493 Other secondary cataract, bilateral: Secondary | ICD-10-CM | POA: Diagnosis not present

## 2016-01-08 DIAGNOSIS — Z961 Presence of intraocular lens: Secondary | ICD-10-CM | POA: Diagnosis not present

## 2016-01-08 DIAGNOSIS — H04123 Dry eye syndrome of bilateral lacrimal glands: Secondary | ICD-10-CM | POA: Diagnosis not present

## 2016-01-22 DIAGNOSIS — H26493 Other secondary cataract, bilateral: Secondary | ICD-10-CM | POA: Diagnosis not present

## 2016-01-30 DIAGNOSIS — Z961 Presence of intraocular lens: Secondary | ICD-10-CM | POA: Diagnosis not present

## 2016-01-30 DIAGNOSIS — H26491 Other secondary cataract, right eye: Secondary | ICD-10-CM | POA: Diagnosis not present

## 2016-02-28 DIAGNOSIS — E784 Other hyperlipidemia: Secondary | ICD-10-CM | POA: Diagnosis not present

## 2016-02-28 DIAGNOSIS — E038 Other specified hypothyroidism: Secondary | ICD-10-CM | POA: Diagnosis not present

## 2016-02-28 DIAGNOSIS — M109 Gout, unspecified: Secondary | ICD-10-CM | POA: Diagnosis not present

## 2016-02-28 DIAGNOSIS — Z0000 Encounter for general adult medical examination without abnormal findings: Secondary | ICD-10-CM | POA: Diagnosis not present

## 2016-02-28 DIAGNOSIS — I10 Essential (primary) hypertension: Secondary | ICD-10-CM | POA: Diagnosis not present

## 2016-02-28 DIAGNOSIS — Z125 Encounter for screening for malignant neoplasm of prostate: Secondary | ICD-10-CM | POA: Diagnosis not present

## 2016-03-06 DIAGNOSIS — E784 Other hyperlipidemia: Secondary | ICD-10-CM | POA: Diagnosis not present

## 2016-03-06 DIAGNOSIS — Z0000 Encounter for general adult medical examination without abnormal findings: Secondary | ICD-10-CM | POA: Diagnosis not present

## 2016-03-06 DIAGNOSIS — D692 Other nonthrombocytopenic purpura: Secondary | ICD-10-CM | POA: Diagnosis not present

## 2016-03-06 DIAGNOSIS — H53462 Homonymous bilateral field defects, left side: Secondary | ICD-10-CM | POA: Diagnosis not present

## 2016-03-06 DIAGNOSIS — I129 Hypertensive chronic kidney disease with stage 1 through stage 4 chronic kidney disease, or unspecified chronic kidney disease: Secondary | ICD-10-CM | POA: Diagnosis not present

## 2016-03-06 DIAGNOSIS — G43109 Migraine with aura, not intractable, without status migrainosus: Secondary | ICD-10-CM | POA: Diagnosis not present

## 2016-03-06 DIAGNOSIS — R69 Illness, unspecified: Secondary | ICD-10-CM | POA: Diagnosis not present

## 2016-03-06 DIAGNOSIS — R808 Other proteinuria: Secondary | ICD-10-CM | POA: Diagnosis not present

## 2016-03-06 DIAGNOSIS — Z23 Encounter for immunization: Secondary | ICD-10-CM | POA: Diagnosis not present

## 2016-03-06 DIAGNOSIS — I480 Paroxysmal atrial fibrillation: Secondary | ICD-10-CM | POA: Diagnosis not present

## 2016-03-06 DIAGNOSIS — R21 Rash and other nonspecific skin eruption: Secondary | ICD-10-CM | POA: Diagnosis not present

## 2016-03-06 DIAGNOSIS — F1320 Sedative, hypnotic or anxiolytic dependence, uncomplicated: Secondary | ICD-10-CM | POA: Diagnosis not present

## 2016-03-17 DIAGNOSIS — L304 Erythema intertrigo: Secondary | ICD-10-CM | POA: Diagnosis not present

## 2016-03-18 ENCOUNTER — Telehealth (INDEPENDENT_AMBULATORY_CARE_PROVIDER_SITE_OTHER): Payer: Self-pay | Admitting: Physical Medicine and Rehabilitation

## 2016-03-18 NOTE — Telephone Encounter (Signed)
Pain is on the left side now. Scheduled for an OV.

## 2016-03-18 NOTE — Telephone Encounter (Signed)
Really need to know how much it helped and for how long. If very little help may need ov

## 2016-03-19 ENCOUNTER — Encounter (INDEPENDENT_AMBULATORY_CARE_PROVIDER_SITE_OTHER): Payer: Self-pay | Admitting: Physical Medicine and Rehabilitation

## 2016-03-19 ENCOUNTER — Ambulatory Visit (INDEPENDENT_AMBULATORY_CARE_PROVIDER_SITE_OTHER): Payer: Medicare HMO | Admitting: Physical Medicine and Rehabilitation

## 2016-03-19 VITALS — BP 128/81 | HR 67

## 2016-03-19 DIAGNOSIS — M545 Low back pain, unspecified: Secondary | ICD-10-CM

## 2016-03-19 DIAGNOSIS — M5442 Lumbago with sciatica, left side: Secondary | ICD-10-CM

## 2016-03-19 DIAGNOSIS — G8929 Other chronic pain: Secondary | ICD-10-CM

## 2016-03-19 DIAGNOSIS — Z1212 Encounter for screening for malignant neoplasm of rectum: Secondary | ICD-10-CM | POA: Diagnosis not present

## 2016-03-19 DIAGNOSIS — M47816 Spondylosis without myelopathy or radiculopathy, lumbar region: Secondary | ICD-10-CM

## 2016-03-19 MED ORDER — METHOCARBAMOL 500 MG PO TABS: 500.0000 mg | ORAL_TABLET | Freq: Three times a day (TID) | ORAL | 0 refills | Status: DC | PRN

## 2016-03-19 MED ORDER — TRIAMCINOLONE ACETONIDE 40 MG/ML IJ SUSP
40.0000 mg | INTRAMUSCULAR | Status: AC | PRN
  Administered 2016-03-19: 40 mg via INTRAMUSCULAR

## 2016-03-19 MED ORDER — LIDOCAINE HCL 1 % IJ SOLN
3.0000 mL | INTRAMUSCULAR | Status: AC | PRN
  Administered 2016-03-19: 3 mL

## 2016-03-19 NOTE — Progress Notes (Signed)
Dean Pratt - 81 y.o. male MRN [id number]  Date of birth: 1935-10-04  Office Visit Note: Visit Date: 03/19/2016 PCP: Precious Reel, MD Referred by: [name], MD  Subjective: Chief Complaint  Patient presents with  . Lower Back - Pain   HPI: Dean Pratt is a 81 year old gentleman that I have seen off and on fairly infrequently over the last 5 years or so for chronic worsening low back and more right radicular hip pain. He comes in today however with several weeks of worsening left-sided pain. He reports that he was outside raking some leaves in the next day just had incredible pain on the left side. The pain is really lumbosacral junction may be a little higher at the iliac crest paraspinal. No referral into the hip or leg. He does have pain going across the back. It is worse with going from flexion to extension. He really is hurting when he does anything bent over. He reaches an 8 out of 10 pain if he's been over. Ordinarily his pain may be a 5 out of 10. The end of the day depending on activity is worse. He gets occasional tingling but that is really not a big complaint at this point. No focal weakness no specific drastic injury with swelling or other dysfunction. He's been using some mild anti-inflammatory without relief. He has had no focal weakness or foot drop. He does have an MRI from 2009 I believe showing mainly disc bulge with paracentral herniation at L4-5 with facet arthropathy throughout with nothing in the way of stenosis. This MRI was completed at [facility] orthopedics.    Review of Systems  Constitutional: Negative for chills, fever, malaise/fatigue and weight loss.  HENT: Negative for hearing loss and sinus pain.   Eyes: Negative for blurred vision, double vision and photophobia.  Respiratory: Negative for cough and shortness of breath.   Cardiovascular: Negative for chest pain, palpitations and leg swelling.  Gastrointestinal: Negative for abdominal pain, nausea and  vomiting.  Genitourinary: Negative for flank pain.  Musculoskeletal: Positive for back pain. Negative for myalgias.  Skin: Negative for itching and rash.  Neurological: Negative for tremors, focal weakness and weakness.  Endo/Heme/Allergies: Negative.   Psychiatric/Behavioral: Negative for depression.  All other systems reviewed and are negative.  Otherwise per HPI.  Assessment & Plan: Visit Diagnoses:  1. Chronic left-sided low back pain with left-sided sciatica   2. Spondylosis without myelopathy or radiculopathy, lumbar region   3. Acute left-sided low back pain without sciatica     Plan: Findings:  Lumbar sprain strain of the left more than right lower back which is given him a lot of musculoskeletal mechanical pain. I think he does have facet arthropathy which is contributing as well. I cannot rule out a small disc related problem is he's had that in the past but this seems to be more mechanical and muscular. We did complete a trigger point injection today to get him some relief. I've given him a prescription for Robaxin. We'll see him back in 4 weeks to see how he is doing. He's doing well he can cancel that appointment. If this were to worsen or declare itself is more radicular pain I would have quick response to get an MRI because his last MRI was several years ago and we've been following for quite some time.    Meds & Orders:  Meds ordered this encounter  Medications  . methocarbamol (ROBAXIN) 500 MG tablet    Sig: Take 1  tablet (500 mg total) by mouth every 8 (eight) hours as needed for muscle spasms.    Dispense:  60 tablet    Refill:  0    Orders Placed This Encounter  Procedures  . Trigger Point Injection    Follow-up: Return in about 4 weeks (around 04/16/2016).   Procedures: Trigger Point Inj Date/Time: 03/19/2016 8:58 AM Performed by: Magnus Sinning Authorized by: Magnus Sinning   Consent Given by:  Patient Site marked: the procedure site was marked     Timeout: prior to procedure the correct patient, procedure, and site was verified   Indications:  Pain Total # of Trigger Points:  1 Location: back   Needle Size:  25 G Approach:  Dorsal and lateral Medications #1:  3 mL lidocaine 1 %; 40 mg triamcinolone acetonide 40 MG/ML Patient tolerance:  Patient tolerated the procedure well with no immediate complications Comments: Left-sided paraspinal musculature was palpated with trigger points in the multifidus and quadratus lumborum.     No notes on file   Clinical History: No specialty comments available.  He reports that he has quit smoking. He has never used smokeless tobacco. No results for input(s): HGBA1C, LABURIC in the last 8760 hours.  Objective:  VS:  HT:    WT:   BMI:     BP:128/81  HR:67bpm  TEMP: ( )  RESP:  Physical Exam  Constitutional: He is oriented to person, place, and time. He appears well-developed and well-nourished. No distress.  HENT:  Head: Normocephalic and atraumatic.  Eyes: Conjunctivae are normal. Pupils are equal, round, and reactive to light.  Neck: Normal range of motion. Neck supple.  Cardiovascular: Regular rhythm and intact distal pulses.   Pulmonary/Chest: Effort normal. No respiratory distress.  Musculoskeletal:  Patient is slow to stand from a seated position. He has concordant pain with extension rotation of the lumbar spine. He has a fatty lipoma left of midline at about the L4 level which is nonpainful. He states he's had this for quite some time. Palpating the musculature around the quadratus lumborum O does elicit some pain response with triggering. He has no pain on the right. No pain over the greater trochanters. No pain with hip rotation. He has good distal strength.  Neurological: He is alert and oriented to person, place, and time. He exhibits normal muscle tone. Coordination normal.  Skin: Skin is warm and dry. No rash noted. No erythema.  Psychiatric: He has a normal mood and affect.   Nursing note and vitals reviewed.   Ortho Exam Imaging: No results found.  Past Medical/Family/Surgical/Social History: Medications & Allergies reviewed per EMR Patient Active Problem List   Diagnosis Date Noted  . OTHER AND UNSPECIFIED COAGULATION DEFECTS 01/02/2010  . ATRIAL FIBRILLATION 01/02/2010  . SNORING 02/27/2009  . ISCHEMIC COLITIS 03/27/2008  . ABDOMINAL PAIN-RLQ 03/22/2008  . ABNORMAL FINDINGS GI TRACT 03/22/2008  . PERSONAL HX COLONIC POLYPS 03/22/2008  . COLONIC POLYPS 03/21/2008  . HYPERLIPIDEMIA 03/21/2008  . GOUT 03/21/2008  . ANXIETY 03/21/2008  . MIGRAINE HEADACHE 03/21/2008  . CONSTIPATION, CHRONIC 03/21/2008  . ROSACEA 03/21/2008  . Essential hypertension 03/21/2008  . CARCINOMA, BASAL CELL, HX OF 03/21/2008  . TUBERCULOSIS, HX OF 03/21/2008  . ATRIAL FIBRILLATION, HX OF 03/21/2008  . BENIGN PROSTATIC HYPERTROPHY, HX OF 03/21/2008   Past Medical History:  Diagnosis Date  . Anxiety   . Atrial flutter ([place])    s/p afib and atrial flutter ablation 04/09/09  . BPH (benign prostatic hyperplasia)   .  Cancer ([place])    basal cell CA removed  . Colitis, ischemic ([place])    secondary to ebolism from afib 1/10  . Constipation   . GERD (gastroesophageal reflux disease)   . Gout   . HTN (hypertension)   . Hyperlipidemia   . Intracranial bleed ([place]) 04/10/10   Right occipital hematoma with a left homonymous hemianopsia   . Migraines   . Paroxysmal atrial fibrillation ([place])    s/p PVI 04/09/09 and 10/17/10  . Rosacea   . Stroke ([place]) 04/08/2010   hemorragic, anticoagulation stopped at that time  . Tuberculosis    s/p treatment 1964   Family History  Problem Relation Age of Onset  . Liver cancer Father   . Prostate cancer Father   . Coronary artery disease Father   . COPD Mother   . Breast cancer Sister   . Colon cancer Neg Hx   . Rectal cancer Neg Hx   . Stomach cancer Neg Hx   . Esophageal cancer Neg Hx    Past Surgical History:  Procedure Laterality  Date  . ablation  04/09/2009   s/p afib and atrial flutter ablation by [facility]  . ABLATION OF DYSRHYTHMIC FOCUS  10/15/09   repeat afib ablation by [facility]  . APPENDECTOMY    . COLONOSCOPY    . CORNEAL LACERATION REPAIR    . INGUINAL HERNIA REPAIR    . POLYPECTOMY    . TONSILLECTOMY    . VASECTOMY     Social History   Occupational History  . Not on file.   Social History Main Topics  . Smoking status: Former Research scientist (life sciences)  . Smokeless tobacco: Never Used  . Alcohol use No  . Drug use: No  . Sexual activity: Not on file

## 2016-03-27 DIAGNOSIS — M5416 Radiculopathy, lumbar region: Secondary | ICD-10-CM | POA: Diagnosis not present

## 2016-03-27 DIAGNOSIS — R5383 Other fatigue: Secondary | ICD-10-CM | POA: Diagnosis not present

## 2016-03-27 DIAGNOSIS — R140 Abdominal distension (gaseous): Secondary | ICD-10-CM | POA: Diagnosis not present

## 2016-03-27 DIAGNOSIS — R05 Cough: Secondary | ICD-10-CM | POA: Diagnosis not present

## 2016-03-27 DIAGNOSIS — R61 Generalized hyperhidrosis: Secondary | ICD-10-CM | POA: Diagnosis not present

## 2016-03-27 DIAGNOSIS — Z6824 Body mass index (BMI) 24.0-24.9, adult: Secondary | ICD-10-CM | POA: Diagnosis not present

## 2016-03-27 DIAGNOSIS — E038 Other specified hypothyroidism: Secondary | ICD-10-CM | POA: Diagnosis not present

## 2016-03-27 DIAGNOSIS — K219 Gastro-esophageal reflux disease without esophagitis: Secondary | ICD-10-CM | POA: Diagnosis not present

## 2016-04-10 ENCOUNTER — Other Ambulatory Visit (INDEPENDENT_AMBULATORY_CARE_PROVIDER_SITE_OTHER): Payer: Self-pay | Admitting: Orthopaedic Surgery

## 2016-04-10 DIAGNOSIS — R61 Generalized hyperhidrosis: Secondary | ICD-10-CM | POA: Diagnosis not present

## 2016-04-14 DIAGNOSIS — I10 Essential (primary) hypertension: Secondary | ICD-10-CM | POA: Diagnosis not present

## 2016-04-14 DIAGNOSIS — Z6824 Body mass index (BMI) 24.0-24.9, adult: Secondary | ICD-10-CM | POA: Diagnosis not present

## 2016-04-14 DIAGNOSIS — I480 Paroxysmal atrial fibrillation: Secondary | ICD-10-CM | POA: Diagnosis not present

## 2016-04-14 DIAGNOSIS — E291 Testicular hypofunction: Secondary | ICD-10-CM | POA: Diagnosis not present

## 2016-04-16 ENCOUNTER — Ambulatory Visit (INDEPENDENT_AMBULATORY_CARE_PROVIDER_SITE_OTHER): Payer: Medicare HMO | Admitting: Physical Medicine and Rehabilitation

## 2016-04-28 DIAGNOSIS — Z7982 Long term (current) use of aspirin: Secondary | ICD-10-CM | POA: Diagnosis not present

## 2016-04-28 DIAGNOSIS — Z79899 Other long term (current) drug therapy: Secondary | ICD-10-CM | POA: Diagnosis not present

## 2016-04-28 DIAGNOSIS — E7800 Pure hypercholesterolemia, unspecified: Secondary | ICD-10-CM | POA: Diagnosis not present

## 2016-04-28 DIAGNOSIS — Z87891 Personal history of nicotine dependence: Secondary | ICD-10-CM | POA: Diagnosis not present

## 2016-04-28 DIAGNOSIS — M109 Gout, unspecified: Secondary | ICD-10-CM | POA: Diagnosis not present

## 2016-04-28 DIAGNOSIS — Z0000 Encounter for general adult medical examination without abnormal findings: Secondary | ICD-10-CM | POA: Diagnosis not present

## 2016-04-28 DIAGNOSIS — R69 Illness, unspecified: Secondary | ICD-10-CM | POA: Diagnosis not present

## 2016-04-28 DIAGNOSIS — I4892 Unspecified atrial flutter: Secondary | ICD-10-CM | POA: Diagnosis not present

## 2016-04-28 DIAGNOSIS — I4891 Unspecified atrial fibrillation: Secondary | ICD-10-CM | POA: Diagnosis not present

## 2016-05-11 DIAGNOSIS — I10 Essential (primary) hypertension: Secondary | ICD-10-CM | POA: Diagnosis not present

## 2016-05-11 DIAGNOSIS — E291 Testicular hypofunction: Secondary | ICD-10-CM | POA: Diagnosis not present

## 2016-05-29 DIAGNOSIS — L304 Erythema intertrigo: Secondary | ICD-10-CM | POA: Diagnosis not present

## 2016-05-29 DIAGNOSIS — X32XXXD Exposure to sunlight, subsequent encounter: Secondary | ICD-10-CM | POA: Diagnosis not present

## 2016-05-29 DIAGNOSIS — B078 Other viral warts: Secondary | ICD-10-CM | POA: Diagnosis not present

## 2016-05-29 DIAGNOSIS — L255 Unspecified contact dermatitis due to plants, except food: Secondary | ICD-10-CM | POA: Diagnosis not present

## 2016-05-29 DIAGNOSIS — L570 Actinic keratosis: Secondary | ICD-10-CM | POA: Diagnosis not present

## 2016-05-29 DIAGNOSIS — L821 Other seborrheic keratosis: Secondary | ICD-10-CM | POA: Diagnosis not present

## 2016-07-06 DIAGNOSIS — E291 Testicular hypofunction: Secondary | ICD-10-CM | POA: Diagnosis not present

## 2016-08-06 DIAGNOSIS — B9689 Other specified bacterial agents as the cause of diseases classified elsewhere: Secondary | ICD-10-CM | POA: Diagnosis not present

## 2016-08-06 DIAGNOSIS — L02224 Furuncle of groin: Secondary | ICD-10-CM | POA: Diagnosis not present

## 2016-09-11 DIAGNOSIS — Z23 Encounter for immunization: Secondary | ICD-10-CM | POA: Diagnosis not present

## 2016-09-11 DIAGNOSIS — R05 Cough: Secondary | ICD-10-CM | POA: Diagnosis not present

## 2016-09-11 DIAGNOSIS — Z6826 Body mass index (BMI) 26.0-26.9, adult: Secondary | ICD-10-CM | POA: Diagnosis not present

## 2016-09-11 DIAGNOSIS — E038 Other specified hypothyroidism: Secondary | ICD-10-CM | POA: Diagnosis not present

## 2016-09-11 DIAGNOSIS — E291 Testicular hypofunction: Secondary | ICD-10-CM | POA: Diagnosis not present

## 2016-09-11 DIAGNOSIS — D692 Other nonthrombocytopenic purpura: Secondary | ICD-10-CM | POA: Diagnosis not present

## 2016-09-11 DIAGNOSIS — I129 Hypertensive chronic kidney disease with stage 1 through stage 4 chronic kidney disease, or unspecified chronic kidney disease: Secondary | ICD-10-CM | POA: Diagnosis not present

## 2016-09-11 DIAGNOSIS — I480 Paroxysmal atrial fibrillation: Secondary | ICD-10-CM | POA: Diagnosis not present

## 2016-10-12 ENCOUNTER — Other Ambulatory Visit (INDEPENDENT_AMBULATORY_CARE_PROVIDER_SITE_OTHER): Payer: Self-pay | Admitting: Physician Assistant

## 2016-12-22 DIAGNOSIS — I480 Paroxysmal atrial fibrillation: Secondary | ICD-10-CM | POA: Diagnosis not present

## 2016-12-22 DIAGNOSIS — I612 Nontraumatic intracerebral hemorrhage in hemisphere, unspecified: Secondary | ICD-10-CM | POA: Diagnosis not present

## 2016-12-22 DIAGNOSIS — I10 Essential (primary) hypertension: Secondary | ICD-10-CM | POA: Diagnosis not present

## 2017-01-15 ENCOUNTER — Other Ambulatory Visit (INDEPENDENT_AMBULATORY_CARE_PROVIDER_SITE_OTHER): Payer: Self-pay | Admitting: Physician Assistant

## 2017-02-04 DIAGNOSIS — H02831 Dermatochalasis of right upper eyelid: Secondary | ICD-10-CM | POA: Diagnosis not present

## 2017-02-04 DIAGNOSIS — H01025 Squamous blepharitis left lower eyelid: Secondary | ICD-10-CM | POA: Diagnosis not present

## 2017-02-04 DIAGNOSIS — H16223 Keratoconjunctivitis sicca, not specified as Sjogren's, bilateral: Secondary | ICD-10-CM | POA: Diagnosis not present

## 2017-02-04 DIAGNOSIS — H5052 Exophoria: Secondary | ICD-10-CM | POA: Diagnosis not present

## 2017-02-04 DIAGNOSIS — L719 Rosacea, unspecified: Secondary | ICD-10-CM | POA: Diagnosis not present

## 2017-02-04 DIAGNOSIS — Z961 Presence of intraocular lens: Secondary | ICD-10-CM | POA: Diagnosis not present

## 2017-02-04 DIAGNOSIS — H01024 Squamous blepharitis left upper eyelid: Secondary | ICD-10-CM | POA: Diagnosis not present

## 2017-02-04 DIAGNOSIS — H01021 Squamous blepharitis right upper eyelid: Secondary | ICD-10-CM | POA: Diagnosis not present

## 2017-02-04 DIAGNOSIS — L718 Other rosacea: Secondary | ICD-10-CM | POA: Diagnosis not present

## 2017-02-04 DIAGNOSIS — H01022 Squamous blepharitis right lower eyelid: Secondary | ICD-10-CM | POA: Diagnosis not present

## 2017-03-02 DIAGNOSIS — R82998 Other abnormal findings in urine: Secondary | ICD-10-CM | POA: Diagnosis not present

## 2017-03-02 DIAGNOSIS — I10 Essential (primary) hypertension: Secondary | ICD-10-CM | POA: Diagnosis not present

## 2017-03-02 DIAGNOSIS — E7849 Other hyperlipidemia: Secondary | ICD-10-CM | POA: Diagnosis not present

## 2017-03-02 DIAGNOSIS — M109 Gout, unspecified: Secondary | ICD-10-CM | POA: Diagnosis not present

## 2017-03-02 DIAGNOSIS — E038 Other specified hypothyroidism: Secondary | ICD-10-CM | POA: Diagnosis not present

## 2017-03-02 DIAGNOSIS — Z125 Encounter for screening for malignant neoplasm of prostate: Secondary | ICD-10-CM | POA: Diagnosis not present

## 2017-03-09 DIAGNOSIS — I480 Paroxysmal atrial fibrillation: Secondary | ICD-10-CM | POA: Diagnosis not present

## 2017-03-09 DIAGNOSIS — N182 Chronic kidney disease, stage 2 (mild): Secondary | ICD-10-CM | POA: Diagnosis not present

## 2017-03-09 DIAGNOSIS — D692 Other nonthrombocytopenic purpura: Secondary | ICD-10-CM | POA: Diagnosis not present

## 2017-03-09 DIAGNOSIS — R05 Cough: Secondary | ICD-10-CM | POA: Diagnosis not present

## 2017-03-09 DIAGNOSIS — Z0000 Encounter for general adult medical examination without abnormal findings: Secondary | ICD-10-CM | POA: Diagnosis not present

## 2017-03-09 DIAGNOSIS — H04129 Dry eye syndrome of unspecified lacrimal gland: Secondary | ICD-10-CM | POA: Diagnosis not present

## 2017-03-09 DIAGNOSIS — H53462 Homonymous bilateral field defects, left side: Secondary | ICD-10-CM | POA: Diagnosis not present

## 2017-03-09 DIAGNOSIS — R5383 Other fatigue: Secondary | ICD-10-CM | POA: Diagnosis not present

## 2017-03-09 DIAGNOSIS — E291 Testicular hypofunction: Secondary | ICD-10-CM | POA: Diagnosis not present

## 2017-03-09 DIAGNOSIS — I129 Hypertensive chronic kidney disease with stage 1 through stage 4 chronic kidney disease, or unspecified chronic kidney disease: Secondary | ICD-10-CM | POA: Diagnosis not present

## 2017-03-11 ENCOUNTER — Telehealth: Payer: Self-pay | Admitting: Internal Medicine

## 2017-03-11 ENCOUNTER — Encounter: Payer: Self-pay | Admitting: Internal Medicine

## 2017-03-11 NOTE — Telephone Encounter (Signed)
Appt has been scheduled for the pt to see Dr. Julien Nordmann on 3/27 at 1130am. Pt aware to arrive 30 minutes early. Letter mailed to the pt.

## 2017-03-12 DIAGNOSIS — Z1212 Encounter for screening for malignant neoplasm of rectum: Secondary | ICD-10-CM | POA: Diagnosis not present

## 2017-03-31 ENCOUNTER — Encounter: Payer: Self-pay | Admitting: Internal Medicine

## 2017-03-31 ENCOUNTER — Telehealth: Payer: Self-pay | Admitting: Internal Medicine

## 2017-03-31 ENCOUNTER — Inpatient Hospital Stay: Payer: Medicare HMO | Attending: Internal Medicine | Admitting: Internal Medicine

## 2017-03-31 ENCOUNTER — Inpatient Hospital Stay: Payer: Medicare HMO

## 2017-03-31 VITALS — BP 127/86 | HR 75 | Temp 98.0°F | Resp 20 | Ht 70.0 in | Wt 172.3 lb

## 2017-03-31 DIAGNOSIS — E291 Testicular hypofunction: Secondary | ICD-10-CM | POA: Insufficient documentation

## 2017-03-31 DIAGNOSIS — K219 Gastro-esophageal reflux disease without esophagitis: Secondary | ICD-10-CM | POA: Insufficient documentation

## 2017-03-31 DIAGNOSIS — R05 Cough: Secondary | ICD-10-CM | POA: Diagnosis not present

## 2017-03-31 DIAGNOSIS — Z8673 Personal history of transient ischemic attack (TIA), and cerebral infarction without residual deficits: Secondary | ICD-10-CM | POA: Insufficient documentation

## 2017-03-31 DIAGNOSIS — Z801 Family history of malignant neoplasm of trachea, bronchus and lung: Secondary | ICD-10-CM | POA: Insufficient documentation

## 2017-03-31 DIAGNOSIS — F419 Anxiety disorder, unspecified: Secondary | ICD-10-CM | POA: Diagnosis not present

## 2017-03-31 DIAGNOSIS — Z87891 Personal history of nicotine dependence: Secondary | ICD-10-CM | POA: Diagnosis not present

## 2017-03-31 DIAGNOSIS — D751 Secondary polycythemia: Secondary | ICD-10-CM

## 2017-03-31 DIAGNOSIS — R531 Weakness: Secondary | ICD-10-CM | POA: Diagnosis not present

## 2017-03-31 DIAGNOSIS — G43909 Migraine, unspecified, not intractable, without status migrainosus: Secondary | ICD-10-CM | POA: Diagnosis not present

## 2017-03-31 DIAGNOSIS — I10 Essential (primary) hypertension: Secondary | ICD-10-CM | POA: Insufficient documentation

## 2017-03-31 DIAGNOSIS — Z79899 Other long term (current) drug therapy: Secondary | ICD-10-CM | POA: Diagnosis not present

## 2017-03-31 DIAGNOSIS — Z85828 Personal history of other malignant neoplasm of skin: Secondary | ICD-10-CM | POA: Diagnosis not present

## 2017-03-31 DIAGNOSIS — I480 Paroxysmal atrial fibrillation: Secondary | ICD-10-CM

## 2017-03-31 DIAGNOSIS — M109 Gout, unspecified: Secondary | ICD-10-CM | POA: Insufficient documentation

## 2017-03-31 DIAGNOSIS — R5383 Other fatigue: Secondary | ICD-10-CM | POA: Insufficient documentation

## 2017-03-31 DIAGNOSIS — Z803 Family history of malignant neoplasm of breast: Secondary | ICD-10-CM

## 2017-03-31 DIAGNOSIS — N400 Enlarged prostate without lower urinary tract symptoms: Secondary | ICD-10-CM | POA: Diagnosis not present

## 2017-03-31 DIAGNOSIS — Z8042 Family history of malignant neoplasm of prostate: Secondary | ICD-10-CM | POA: Diagnosis not present

## 2017-03-31 DIAGNOSIS — E785 Hyperlipidemia, unspecified: Secondary | ICD-10-CM | POA: Insufficient documentation

## 2017-03-31 DIAGNOSIS — Z7982 Long term (current) use of aspirin: Secondary | ICD-10-CM | POA: Insufficient documentation

## 2017-03-31 LAB — CBC WITH DIFFERENTIAL (CANCER CENTER ONLY)
BASOS ABS: 0.1 10*3/uL (ref 0.0–0.1)
Basophils Relative: 1 %
EOS PCT: 1 %
Eosinophils Absolute: 0.1 10*3/uL (ref 0.0–0.5)
HCT: 53.9 % — ABNORMAL HIGH (ref 38.4–49.9)
Hemoglobin: 18.2 g/dL — ABNORMAL HIGH (ref 13.0–17.1)
LYMPHS ABS: 1.1 10*3/uL (ref 0.9–3.3)
LYMPHS PCT: 13 %
MCH: 29.9 pg (ref 27.2–33.4)
MCHC: 33.7 g/dL (ref 32.0–36.0)
MCV: 88.6 fL (ref 79.3–98.0)
MONO ABS: 0.6 10*3/uL (ref 0.1–0.9)
Monocytes Relative: 7 %
Neutro Abs: 6.7 10*3/uL — ABNORMAL HIGH (ref 1.5–6.5)
Neutrophils Relative %: 78 %
PLATELETS: 367 10*3/uL (ref 140–400)
RBC: 6.09 MIL/uL — AB (ref 4.20–5.82)
RDW: 14.4 % (ref 11.0–14.6)
WBC: 8.6 10*3/uL (ref 4.0–10.3)

## 2017-03-31 LAB — CMP (CANCER CENTER ONLY)
ALT: 26 U/L (ref 0–55)
ANION GAP: 8 (ref 3–11)
AST: 20 U/L (ref 5–34)
Albumin: 4.5 g/dL (ref 3.5–5.0)
Alkaline Phosphatase: 73 U/L (ref 40–150)
BUN: 12 mg/dL (ref 7–26)
CHLORIDE: 106 mmol/L (ref 98–109)
CO2: 26 mmol/L (ref 22–29)
Calcium: 10.4 mg/dL (ref 8.4–10.4)
Creatinine: 1.09 mg/dL (ref 0.70–1.30)
Glucose, Bld: 79 mg/dL (ref 70–140)
POTASSIUM: 4.6 mmol/L (ref 3.5–5.1)
SODIUM: 140 mmol/L (ref 136–145)
Total Bilirubin: 0.7 mg/dL (ref 0.2–1.2)
Total Protein: 7.3 g/dL (ref 6.4–8.3)

## 2017-03-31 LAB — LACTATE DEHYDROGENASE: LDH: 141 U/L (ref 125–245)

## 2017-03-31 NOTE — Progress Notes (Signed)
[place] [phone]   Fax:(336) [phone]  CONSULT NOTE  REFERRING PHYSICIAN: Dr. [name]  REASON FOR CONSULTATION:  82 years old white male presented for evaluation of polycythemia.  HPI NYSIR FERGUSSON is a 82 y.o. male with past medical history significant for anxiety, hypertension, dyslipidemia, benign prostatic hypertrophy, stroke, gout, migraine headache as well as atrial flutter.  The patient was diagnosed with hypogonadism by his primary care physician after he was complaining of increasing fatigue and weakness.  He was a started on compounded testosterone by his primary care physician Dr. Virgina Jock in the summer 2018.  He took it for around 3-4 months.  The patient ran out of his medication and quit taking it for almost a months before his follow-up visit with his PCP.  He was seen recently by his primary care physician for refill of his medication and repeat blood work including CBC on March 05, 2017 showed elevated hemoglobin of 19.4 and hematocrit 56.2%.  He has normal white blood count as well as platelets count. The patient was referred to me today for evaluation and recommendation regarding his polycythemia.  Previous blood work performed few years before showed normal hemoglobin and hematocrit. When seen today the patient is feeling fine with no specific complaints except for mild dry cough.  He denied having any chest pain, shortness breath or hemoptysis.  He denied having any recent weight loss or night sweats.  He has no nausea, vomiting, diarrhea or constipation.  He has no significant bleeding or bruises. Family history significant for father with prostate cancer, mother had COPD and lung cancer, sister had breast cancer as well as lung cancer. The patient is married and has 1 daughter.  He used to owns a picture framing shop.  He has a remote history for smoking for only 3 years and quit in 1964.  He has no history of alcohol or drug  abuse.   HPI  Past Medical History:  Diagnosis Date  . Anxiety   . Atrial flutter ([place])    s/p afib and atrial flutter ablation 04/09/09  . BPH (benign prostatic hyperplasia)   . Cancer ([place])    basal cell CA removed  . Colitis, ischemic ([place])    secondary to ebolism from afib 1/10  . Constipation   . GERD (gastroesophageal reflux disease)   . Gout   . HTN (hypertension)   . Hyperlipidemia   . Intracranial bleed ([place]) 04/10/10   Right occipital hematoma with a left homonymous hemianopsia   . Migraines   . Paroxysmal atrial fibrillation ([place])    s/p PVI 04/09/09 and 10/17/10  . Rosacea   . Stroke ([place]) 04/08/2010   hemorragic, anticoagulation stopped at that time  . Tuberculosis    s/p treatment 1964    Past Surgical History:  Procedure Laterality Date  . ablation  04/09/2009   s/p afib and atrial flutter ablation by [facility]  . ABLATION OF DYSRHYTHMIC FOCUS  10/15/09   repeat afib ablation by [facility]  . APPENDECTOMY    . COLONOSCOPY    . CORNEAL LACERATION REPAIR    . INGUINAL HERNIA REPAIR    . POLYPECTOMY    . TONSILLECTOMY    . VASECTOMY      Family History  Problem Relation Age of Onset  . Liver cancer Father   . Prostate cancer Father   . Coronary artery disease Father   . COPD Mother   . Breast cancer Sister   .  Colon cancer Neg Hx   . Rectal cancer Neg Hx   . Stomach cancer Neg Hx   . Esophageal cancer Neg Hx     Social History Social History   Tobacco Use  . Smoking status: Former Research scientist (life sciences)  . Smokeless tobacco: Never Used  Substance Use Topics  . Alcohol use: No  . Drug use: No    Allergies  Allergen Reactions  . Ace Inhibitors Cough  . Warfarin And Related     Intercranial bleed    Current Outpatient Medications  Medication Sig Dispense Refill  . acetaminophen (TYLENOL) 325 MG tablet Take 650 mg by mouth every 6 (six) hours as needed.      Marland Kitchen allopurinol (ZYLOPRIM) 100 MG tablet Take 100 mg by mouth at bedtime.      . Ascorbic Acid (VITAMIN C) 1000  MG tablet Take 1,000 mg by mouth daily.     Marland Kitchen aspirin 81 MG tablet Take 81 mg by mouth daily.      . diclofenac sodium (VOLTAREN) 1 % GEL APPLY 2-4 GRAMS 2-3 TIMES DAILY AS NEEDED FOR PAIN 100 g 0  . diltiazem (CARDIZEM CD) 240 MG 24 hr capsule Take 1 tablet by mouth daily.    Marland Kitchen ibuprofen (ADVIL,MOTRIN) 200 MG tablet Take 200 mg by mouth every 6 (six) hours as needed.    Marland Kitchen losartan (COZAAR) 25 MG tablet Take 25 mg by mouth daily.      . methocarbamol (ROBAXIN) 500 MG tablet Take 1 tablet (500 mg total) by mouth every 8 (eight) hours as needed for muscle spasms. 60 tablet 0  . NON FORMULARY Stool softener 100 mg... Take 1 capsule by mouth once daily.     Marland Kitchen diltiazem (TIAZAC) 240 MG 24 hr capsule Take by mouth daily.  3  . fluorometholone (FML) 0.1 % ophthalmic suspension SHAKE LQ AND INT 1 GTT IN OU QID  0  . LORazepam (ATIVAN) 1 MG tablet TAKE 1/2-1 TABLET TWICE DAILY AS NEEDED  3   No current facility-administered medications for this visit.     Review of Systems  Constitutional: positive for fatigue Eyes: negative Ears, nose, mouth, throat, and face: negative Respiratory: positive for cough Cardiovascular: negative Gastrointestinal: negative Genitourinary:negative Integument/breast: negative Hematologic/lymphatic: negative Musculoskeletal:negative Neurological: negative Behavioral/Psych: negative Endocrine: negative Allergic/Immunologic: negative  Physical Exam  NIO:EVOJJ, healthy, no distress, well nourished, well developed and anxious SKIN: skin color, texture, turgor are normal, no rashes or significant lesions HEAD: Normocephalic, No masses, lesions, tenderness or abnormalities EYES: normal, PERRLA, Conjunctiva are pink and non-injected EARS: External ears normal, Canals clear OROPHARYNX:no exudate, no erythema and lips, buccal mucosa, and tongue normal  NECK: supple, no adenopathy, no JVD LYMPH:  no palpable lymphadenopathy, no hepatosplenomegaly LUNGS: clear to  auscultation , and palpation HEART: irregular rate & rhythm, no murmurs and no gallops ABDOMEN:abdomen soft, non-tender, normal bowel sounds and no masses or organomegaly BACK: No CVA tenderness, Range of motion is normal EXTREMITIES:no joint deformities, effusion, or inflammation, no edema  NEURO: alert & oriented x 3 with fluent speech, no focal motor/sensory deficits  PERFORMANCE STATUS: ECOG 1  LABORATORY DATA: Lab Results  Component Value Date   WBC 8.6 03/31/2017   HGB 14.7 04/10/2010   HCT 53.9 (H) 03/31/2017   MCV 88.6 03/31/2017   PLT 367 03/31/2017      Chemistry      Component Value Date/Time   NA 140 03/31/2017 1303   K 4.6 03/31/2017 1303   CL 106 03/31/2017 1303  CO2 26 03/31/2017 1303   BUN 12 03/31/2017 1303   CREATININE 1.09 03/31/2017 1303      Component Value Date/Time   CALCIUM 9.3 04/10/2010 0605   ALKPHOS 59 05/29/2009 0000   AST 18 05/29/2009 0000   ALT 17 05/29/2009 0000   BILITOT 0.5 05/29/2009 0000       RADIOGRAPHIC STUDIES: No results found.  ASSESSMENT: This is a very pleasant 82 years old white male presented for evaluation of polycythemia.  This is most likely reactive in nature secondary to his recent treatment with androgen replacement therapy for hypogonadism.  The patient has normal hemoglobin and hematocrit few years before.  He was a started on the compound testosterone several months ago.   PLAN: I had a lengthy discussion with the patient today about his condition and further investigation to rule out any underlying myeloproliferative disorder. I recommended for the patient to have repeat CBC, comprehensive metabolic panel, LDH as well as Jak 2 mutation molecular study today. Again this is likely to be reactive in nature secondary to his treatment with hormonal therapy.  I would consider the patient for phlebotomy to keep his hematocrit around 45% because of his history of previous stroke. I will arrange for the patient to come  back for follow-up visit in 2 weeks for reevaluation and discussion of the pending lab results as well as phlebotomy as needed. He was advised to call immediately if he has any concerning symptoms in the interval. The patient voices understanding of current disease status and treatment options and is in agreement with the current care plan.  All questions were answered. The patient knows to call the clinic with any problems, questions or concerns. We can certainly see the patient much sooner if necessary.  Thank you so much for allowing me to participate in the care of [name]. I will continue to follow up the patient with you and assist in his care.  I spent 40 minutes counseling the patient face to face. The total time spent in the appointment was 60 minutes.  Disclaimer: This note was dictated with voice recognition software. Similar sounding words can inadvertently be transcribed and may not be corrected upon review.   Eilleen Kempf March 31, 2017, 12:09 PM

## 2017-03-31 NOTE — Telephone Encounter (Signed)
Scheduled appt per 3/27 los- Gave patient AVS and calender per los.  

## 2017-04-12 LAB — JAK2 (INCLUDING V617F AND EXON 12), MPL,& CALR-NEXT GEN SEQ

## 2017-04-13 ENCOUNTER — Inpatient Hospital Stay: Payer: Medicare HMO

## 2017-04-13 ENCOUNTER — Other Ambulatory Visit: Payer: Self-pay | Admitting: Internal Medicine

## 2017-04-13 ENCOUNTER — Encounter: Payer: Self-pay | Admitting: Internal Medicine

## 2017-04-13 ENCOUNTER — Telehealth: Payer: Self-pay

## 2017-04-13 ENCOUNTER — Inpatient Hospital Stay: Payer: Medicare HMO | Attending: Internal Medicine | Admitting: Internal Medicine

## 2017-04-13 VITALS — BP 131/71 | HR 70 | Temp 97.8°F | Resp 18 | Ht 70.0 in | Wt 174.2 lb

## 2017-04-13 VITALS — BP 139/68 | HR 74 | Resp 18

## 2017-04-13 DIAGNOSIS — I10 Essential (primary) hypertension: Secondary | ICD-10-CM | POA: Insufficient documentation

## 2017-04-13 DIAGNOSIS — E785 Hyperlipidemia, unspecified: Secondary | ICD-10-CM | POA: Insufficient documentation

## 2017-04-13 DIAGNOSIS — D45 Polycythemia vera: Secondary | ICD-10-CM | POA: Diagnosis not present

## 2017-04-13 DIAGNOSIS — Z8673 Personal history of transient ischemic attack (TIA), and cerebral infarction without residual deficits: Secondary | ICD-10-CM | POA: Diagnosis not present

## 2017-04-13 DIAGNOSIS — Z7982 Long term (current) use of aspirin: Secondary | ICD-10-CM | POA: Insufficient documentation

## 2017-04-13 DIAGNOSIS — R5383 Other fatigue: Secondary | ICD-10-CM | POA: Insufficient documentation

## 2017-04-13 DIAGNOSIS — R059 Cough, unspecified: Secondary | ICD-10-CM

## 2017-04-13 DIAGNOSIS — R05 Cough: Secondary | ICD-10-CM | POA: Diagnosis not present

## 2017-04-13 DIAGNOSIS — D751 Secondary polycythemia: Secondary | ICD-10-CM

## 2017-04-13 DIAGNOSIS — K219 Gastro-esophageal reflux disease without esophagitis: Secondary | ICD-10-CM | POA: Insufficient documentation

## 2017-04-13 DIAGNOSIS — Z79899 Other long term (current) drug therapy: Secondary | ICD-10-CM | POA: Insufficient documentation

## 2017-04-13 DIAGNOSIS — Z85828 Personal history of other malignant neoplasm of skin: Secondary | ICD-10-CM | POA: Diagnosis not present

## 2017-04-13 DIAGNOSIS — N400 Enlarged prostate without lower urinary tract symptoms: Secondary | ICD-10-CM | POA: Diagnosis not present

## 2017-04-13 LAB — CBC WITH DIFFERENTIAL (CANCER CENTER ONLY)
BASOS ABS: 0.1 10*3/uL (ref 0.0–0.1)
Basophils Relative: 1 %
Eosinophils Absolute: 0.2 10*3/uL (ref 0.0–0.5)
Eosinophils Relative: 2 %
HEMATOCRIT: 50.1 % — AB (ref 38.4–49.9)
Hemoglobin: 17.1 g/dL (ref 13.0–17.1)
LYMPHS PCT: 15 %
Lymphs Abs: 1.4 10*3/uL (ref 0.9–3.3)
MCH: 30.2 pg (ref 27.2–33.4)
MCHC: 34.2 g/dL (ref 32.0–36.0)
MCV: 88.2 fL (ref 79.3–98.0)
MONO ABS: 0.6 10*3/uL (ref 0.1–0.9)
Monocytes Relative: 7 %
NEUTROS ABS: 6.6 10*3/uL — AB (ref 1.5–6.5)
Neutrophils Relative %: 75 %
Platelet Count: 349 10*3/uL (ref 140–400)
RBC: 5.67 MIL/uL (ref 4.20–5.82)
RDW: 14.6 % (ref 11.0–14.6)
WBC Count: 8.9 10*3/uL (ref 4.0–10.3)

## 2017-04-13 NOTE — Patient Instructions (Signed)

## 2017-04-13 NOTE — Telephone Encounter (Signed)
Printed avs and calender of upcoming appointment. Per 4/9 los.  Patient will return at 3pm today for phleb.

## 2017-04-13 NOTE — Progress Notes (Signed)
Dean Pratt presents today for phlebotomy per MD orders. Phlebotomy procedure started at 1217 and ended at 1227 via 29 G left AC. 505grams removed. Patient observed for 30 minutes after procedure without any incident. Patient tolerated procedure well. IV needle removed intact.

## 2017-04-13 NOTE — Progress Notes (Signed)
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    PRIOR THERAPY: None  CURRENT THERAPY: Phlebotomy on as-needed basis.  INTERVAL HISTORY: Dean Pratt 82 y.o. male returns to the clinic today for follow-up visit.  The patient is feeling fine today with no specific complaints except for mild fatigue as well as dry cough.  He denied having any chest pain, shortness or breath or hemoptysis.  He denied having any recent weight loss or night sweats.  He has no nausea, vomiting, diarrhea or constipation.  He had molecular studies performed recently for Jak 2 mutation and it was reported to be positive.  He is here today for evaluation and discussion of his treatment options.  MEDICAL HISTORY: Past Medical History:  Diagnosis Date  . Anxiety   . Atrial flutter ([place])    s/p afib and atrial flutter ablation 04/09/09  . BPH (benign prostatic hyperplasia)   . Cancer ([place])    basal cell CA removed  . Colitis, ischemic ([place])    secondary to ebolism from afib 1/10  . Constipation   . GERD (gastroesophageal reflux disease)   . Gout   . HTN (hypertension)   . Hyperlipidemia   . Intracranial bleed ([place]) 04/10/10   Right occipital hematoma with a left homonymous hemianopsia   . Migraines   . Paroxysmal atrial fibrillation ([place])    s/p PVI 04/09/09 and 10/17/10  . Rosacea   . Stroke ([place]) 04/08/2010   hemorragic, anticoagulation stopped at that time  . Tuberculosis    s/p treatment 1964    ALLERGIES:  is allergic to ace inhibitors and warfarin and related.  MEDICATIONS:  Current Outpatient Medications  Medication Sig Dispense Refill  . acetaminophen (TYLENOL) 325 MG tablet Take 650 mg by mouth every 6 (six) hours as needed.      Marland Kitchen allopurinol (ZYLOPRIM) 100 MG tablet Take 100 mg by mouth at bedtime.      . Ascorbic Acid  (VITAMIN C) 1000 MG tablet Take 1,000 mg by mouth daily.     Marland Kitchen aspirin 81 MG tablet Take 81 mg by mouth daily.      . diclofenac sodium (VOLTAREN) 1 % GEL APPLY 2-4 GRAMS 2-3 TIMES DAILY AS NEEDED FOR PAIN 100 g 0  . diltiazem (CARDIZEM CD) 240 MG 24 hr capsule Take 1 tablet by mouth daily.    Marland Kitchen diltiazem (TIAZAC) 240 MG 24 hr capsule Take by mouth daily.  3  . fluorometholone (FML) 0.1 % ophthalmic suspension SHAKE LQ AND INT 1 GTT IN OU QID  0  . ibuprofen (ADVIL,MOTRIN) 200 MG tablet Take 200 mg by mouth every 6 (six) hours as needed.    Marland Kitchen LORazepam (ATIVAN) 1 MG tablet TAKE 1/2-1 TABLET TWICE DAILY AS NEEDED  3  . losartan (COZAAR) 25 MG tablet Take 25 mg by mouth daily.      . methocarbamol (ROBAXIN) 500 MG tablet Take 1 tablet (500 mg total) by mouth every 8 (eight) hours as needed for muscle spasms. 60 tablet 0  . NON FORMULARY Stool softener 100 mg... Take 1 capsule by mouth once daily.      No current facility-administered medications for this visit.     SURGICAL HISTORY:  Past Surgical History:  Procedure Laterality Date  . ablation  04/09/2009   s/p afib and atrial  flutter ablation by [facility]  . ABLATION OF DYSRHYTHMIC FOCUS  10/15/09   repeat afib ablation by [facility]  . APPENDECTOMY    . COLONOSCOPY    . CORNEAL LACERATION REPAIR    . INGUINAL HERNIA REPAIR    . POLYPECTOMY    . TONSILLECTOMY    . VASECTOMY      REVIEW OF SYSTEMS:  A comprehensive review of systems was negative except for: Constitutional: positive for fatigue Respiratory: positive for cough   PHYSICAL EXAMINATION: General appearance: alert, cooperative, fatigued and no distress Head: Normocephalic, without obvious abnormality, atraumatic Neck: no adenopathy, no JVD, supple, symmetrical, trachea midline and thyroid not enlarged, symmetric, no tenderness/mass/nodules Lymph nodes: Cervical, supraclavicular, and axillary nodes normal. Resp: clear to auscultation bilaterally Back: symmetric, no curvature. ROM  normal. No CVA tenderness. Cardio: regular rate and rhythm, S1, S2 normal, no murmur, click, rub or gallop GI: soft, non-tender; bowel sounds normal; no masses,  no organomegaly Extremities: extremities normal, atraumatic, no cyanosis or edema  ECOG PERFORMANCE STATUS: 1 - Symptomatic but completely ambulatory  Blood pressure 131/71, pulse 70, temperature 97.8 F (36.6 C), temperature source Oral, resp. rate 18, height 5\' 10"  (1.778 m), weight 174 lb 3.2 oz (79 kg), SpO2 99 %.  LABORATORY DATA: Lab Results  Component Value Date   WBC 8.9 04/13/2017   HGB 14.7 04/10/2010   HCT 50.1 (H) 04/13/2017   MCV 88.2 04/13/2017   PLT 349 04/13/2017      Chemistry      Component Value Date/Time   NA 140 03/31/2017 1303   K 4.6 03/31/2017 1303   CL 106 03/31/2017 1303   CO2 26 03/31/2017 1303   BUN 12 03/31/2017 1303   CREATININE 1.09 03/31/2017 1303      Component Value Date/Time   CALCIUM 10.4 03/31/2017 1303   ALKPHOS 73 03/31/2017 1303   AST 20 03/31/2017 1303   ALT 26 03/31/2017 1303   BILITOT 0.7 03/31/2017 1303       RADIOGRAPHIC STUDIES: No results found.  ASSESSMENT AND PLAN: This is a very pleasant 82 years old white male with recently diagnosed polycythemia vera with positive Jak 2 mutation.  The patient is also currently on hormonal therapy for androgen deficiency. I recommended for him to proceed with phlebotomy on as-needed basis to keep his hematocrit around 45%.  He is expected to have the first phlebotomy today and he will have another one next week. I will see him back for follow-up visit in 2 weeks for evaluation with repeat blood work. For the persistent cough, I will order chest x-ray and also advised the patient to use over-the-counter Delsym. He was advised to call if he has any concerning symptoms in the interval. The patient voices understanding of current disease status and treatment options and is in agreement with the current care plan.  All questions  were answered. The patient knows to call the clinic with any problems, questions or concerns. We can certainly see the patient much sooner if necessary.  I spent 10 minutes counseling the patient face to face. The total time spent in the appointment was 15 minutes.  Disclaimer: This note was dictated with voice recognition software. Similar sounding words can inadvertently be transcribed and may not be corrected upon review.

## 2017-04-20 ENCOUNTER — Inpatient Hospital Stay: Payer: Medicare HMO

## 2017-04-20 VITALS — BP 112/71 | HR 65 | Temp 97.7°F | Resp 17

## 2017-04-20 DIAGNOSIS — R5383 Other fatigue: Secondary | ICD-10-CM | POA: Diagnosis not present

## 2017-04-20 DIAGNOSIS — K219 Gastro-esophageal reflux disease without esophagitis: Secondary | ICD-10-CM | POA: Diagnosis not present

## 2017-04-20 DIAGNOSIS — Z7982 Long term (current) use of aspirin: Secondary | ICD-10-CM | POA: Diagnosis not present

## 2017-04-20 DIAGNOSIS — N400 Enlarged prostate without lower urinary tract symptoms: Secondary | ICD-10-CM | POA: Diagnosis not present

## 2017-04-20 DIAGNOSIS — Z85828 Personal history of other malignant neoplasm of skin: Secondary | ICD-10-CM | POA: Diagnosis not present

## 2017-04-20 DIAGNOSIS — Z79899 Other long term (current) drug therapy: Secondary | ICD-10-CM | POA: Diagnosis not present

## 2017-04-20 DIAGNOSIS — E785 Hyperlipidemia, unspecified: Secondary | ICD-10-CM | POA: Diagnosis not present

## 2017-04-20 DIAGNOSIS — I10 Essential (primary) hypertension: Secondary | ICD-10-CM | POA: Diagnosis not present

## 2017-04-20 DIAGNOSIS — R05 Cough: Secondary | ICD-10-CM | POA: Diagnosis not present

## 2017-04-20 DIAGNOSIS — D45 Polycythemia vera: Secondary | ICD-10-CM | POA: Diagnosis not present

## 2017-04-20 NOTE — Patient Instructions (Signed)

## 2017-04-20 NOTE — Progress Notes (Signed)
Dean Pratt presents today for phlebotomy per MD orders. Phlebotomy procedure started at 1038 and ended at 1050 via 16 gauge needle to left AC. During phlebotomy this RN observed swelling around needle insertion site. Pt denied any pain. No bleeding or redness noted. PIV intact and draining blood without difficulty. Pt stated he wanted to continue with phlebotomy if draining and continued to deny pain. At end of procedure pressure dressing placed of guaze and kerlex to site and hot pack applied. Pt educated on post phlebotomy care and aware to continue to apply heat intermittently to PIV site. Pt stated it was not painful. No bleeding noted around site. No skin discoloration noted.  508 grams removed. Patient observed for 30 minutes after procedure without any incident. Patient tolerated procedure well.

## 2017-04-29 ENCOUNTER — Inpatient Hospital Stay (HOSPITAL_BASED_OUTPATIENT_CLINIC_OR_DEPARTMENT_OTHER): Payer: Medicare HMO | Admitting: Oncology

## 2017-04-29 ENCOUNTER — Ambulatory Visit (HOSPITAL_COMMUNITY)
Admission: RE | Admit: 2017-04-29 | Discharge: 2017-04-29 | Disposition: A | Discharge status: Home / Self Care | Payer: Medicare HMO | Source: Ambulatory Visit | Attending: Oncology | Admitting: Oncology

## 2017-04-29 ENCOUNTER — Inpatient Hospital Stay: Payer: Medicare HMO

## 2017-04-29 ENCOUNTER — Telehealth: Payer: Self-pay

## 2017-04-29 VITALS — BP 132/77 | HR 63 | Temp 97.8°F | Resp 18 | Ht 70.0 in | Wt 176.1 lb

## 2017-04-29 DIAGNOSIS — I10 Essential (primary) hypertension: Secondary | ICD-10-CM | POA: Diagnosis not present

## 2017-04-29 DIAGNOSIS — Z7982 Long term (current) use of aspirin: Secondary | ICD-10-CM

## 2017-04-29 DIAGNOSIS — N400 Enlarged prostate without lower urinary tract symptoms: Secondary | ICD-10-CM | POA: Diagnosis not present

## 2017-04-29 DIAGNOSIS — Z79899 Other long term (current) drug therapy: Secondary | ICD-10-CM | POA: Diagnosis not present

## 2017-04-29 DIAGNOSIS — R05 Cough: Secondary | ICD-10-CM | POA: Diagnosis not present

## 2017-04-29 DIAGNOSIS — Z85828 Personal history of other malignant neoplasm of skin: Secondary | ICD-10-CM

## 2017-04-29 DIAGNOSIS — R5383 Other fatigue: Secondary | ICD-10-CM | POA: Diagnosis not present

## 2017-04-29 DIAGNOSIS — Z8673 Personal history of transient ischemic attack (TIA), and cerebral infarction without residual deficits: Secondary | ICD-10-CM | POA: Diagnosis not present

## 2017-04-29 DIAGNOSIS — D45 Polycythemia vera: Secondary | ICD-10-CM

## 2017-04-29 DIAGNOSIS — R059 Cough, unspecified: Secondary | ICD-10-CM

## 2017-04-29 DIAGNOSIS — K219 Gastro-esophageal reflux disease without esophagitis: Secondary | ICD-10-CM | POA: Diagnosis not present

## 2017-04-29 DIAGNOSIS — E785 Hyperlipidemia, unspecified: Secondary | ICD-10-CM

## 2017-04-29 DIAGNOSIS — I517 Cardiomegaly: Secondary | ICD-10-CM | POA: Insufficient documentation

## 2017-04-29 LAB — CMP (CANCER CENTER ONLY)
ALT: 20 U/L (ref 0–55)
AST: 19 U/L (ref 5–34)
Albumin: 4.4 g/dL (ref 3.5–5.0)
Alkaline Phosphatase: 60 U/L (ref 40–150)
Anion gap: 7 (ref 3–11)
BUN: 13 mg/dL (ref 7–26)
CHLORIDE: 108 mmol/L (ref 98–109)
CO2: 26 mmol/L (ref 22–29)
CREATININE: 1.04 mg/dL (ref 0.70–1.30)
Calcium: 10 mg/dL (ref 8.4–10.4)
GFR, Estimated: >60 mL/min (ref >60)
Glucose, Bld: 89 mg/dL (ref 70–140)
Potassium: 4.7 mmol/L (ref 3.5–5.1)
SODIUM: 141 mmol/L (ref 136–145)
Total Bilirubin: 0.7 mg/dL (ref 0.2–1.2)
Total Protein: 6.7 g/dL (ref 6.4–8.3)

## 2017-04-29 LAB — CBC WITH DIFFERENTIAL (CANCER CENTER ONLY)
Basophils Absolute: 0.1 10*3/uL (ref 0.0–0.1)
Basophils Relative: 1 %
EOS ABS: 0.2 10*3/uL (ref 0.0–0.5)
Eosinophils Relative: 3 %
HCT: 42.6 % (ref 38.4–49.9)
Hemoglobin: 14.7 g/dL (ref 13.0–17.1)
LYMPHS ABS: 1.5 10*3/uL (ref 0.9–3.3)
LYMPHS PCT: 21 %
MCH: 30.7 pg (ref 27.2–33.4)
MCHC: 34.6 g/dL (ref 32.0–36.0)
MCV: 88.9 fL (ref 79.3–98.0)
MONOS PCT: 7 %
Monocytes Absolute: 0.5 10*3/uL (ref 0.1–0.9)
NEUTROS PCT: 68 %
Neutro Abs: 5 10*3/uL (ref 1.5–6.5)
Platelet Count: 339 10*3/uL (ref 140–400)
RBC: 4.8 MIL/uL (ref 4.20–5.82)
RDW: 14.9 % — ABNORMAL HIGH (ref 11.0–14.6)
WBC Count: 7.4 10*3/uL (ref 4.0–10.3)

## 2017-04-29 LAB — LACTATE DEHYDROGENASE: LDH: 138 U/L (ref 125–245)

## 2017-04-29 NOTE — Telephone Encounter (Signed)
Printed avs and calender of upcoming appointment. Per 4/25 los

## 2017-04-29 NOTE — Progress Notes (Signed)
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   PRIOR THERAPY: None.  CURRENT THERAPY: Phlebotomy on as-needed basis.  INTERVAL HISTORY: Dean Pratt 82 y.o. male returns for routine follow-up visit by himself.  Patient is feeling fine today with no specific complaints except for mild fatigue and ongoing dry cough.  The patient was supposed to have a chest x-ray following his last visit but has not had it yet.  He tolerated phlebotomy fairly well.  He denies fevers and chills.  Denies chest pain, shortness of breath, hemoptysis.  Denies nausea, vomiting, constipation, diarrhea.  Denies any recent weight loss or night sweats.  The patient is here for evaluation and repeat lab work.  MEDICAL HISTORY: Past Medical History:  Diagnosis Date  . Anxiety   . Atrial flutter ([place])    s/p afib and atrial flutter ablation 04/09/09  . BPH (benign prostatic hyperplasia)   . Cancer ([place])    basal cell CA removed  . Colitis, ischemic ([place])    secondary to ebolism from afib 1/10  . Constipation   . GERD (gastroesophageal reflux disease)   . Gout   . HTN (hypertension)   . Hyperlipidemia   . Intracranial bleed ([place]) 04/10/10   Right occipital hematoma with a left homonymous hemianopsia   . Migraines   . Paroxysmal atrial fibrillation ([place])    s/p PVI 04/09/09 and 10/17/10  . Rosacea   . Stroke ([place]) 04/08/2010   hemorragic, anticoagulation stopped at that time  . Tuberculosis    s/p treatment 1964    ALLERGIES:  is allergic to ace inhibitors and warfarin and related.  MEDICATIONS:  Current Outpatient Medications  Medication Sig Dispense Refill  . acetaminophen (TYLENOL) 325 MG tablet Take 650 mg by mouth every 6 (six) hours as needed.      Marland Kitchen allopurinol (ZYLOPRIM) 100 MG tablet Take 100 mg by mouth at bedtime.      . Ascorbic Acid (VITAMIN C) 1000 MG tablet Take 1,000 mg by  mouth daily.     Marland Kitchen aspirin 81 MG tablet Take 81 mg by mouth daily.      . diclofenac sodium (VOLTAREN) 1 % GEL APPLY 2-4 GRAMS 2-3 TIMES DAILY AS NEEDED FOR PAIN 100 g 0  . diltiazem (CARDIZEM CD) 240 MG 24 hr capsule Take 1 tablet by mouth daily.    Marland Kitchen diltiazem (TIAZAC) 240 MG 24 hr capsule Take by mouth daily.  3  . fluorometholone (FML) 0.1 % ophthalmic suspension SHAKE LQ AND INT 1 GTT IN OU QID  0  . ibuprofen (ADVIL,MOTRIN) 200 MG tablet Take 200 mg by mouth every 6 (six) hours as needed.    Marland Kitchen LORazepam (ATIVAN) 1 MG tablet TAKE 1/2-1 TABLET TWICE DAILY AS NEEDED  3  . losartan (COZAAR) 25 MG tablet Take 25 mg by mouth daily.      . methocarbamol (ROBAXIN) 500 MG tablet Take 1 tablet (500 mg total) by mouth every 8 (eight) hours as needed for muscle spasms. 60 tablet 0  . NON FORMULARY Stool softener 100 mg... Take 1 capsule by mouth once daily.      No current facility-administered medications for this visit.     SURGICAL HISTORY:  Past Surgical History:  Procedure Laterality Date  . ablation  04/09/2009   s/p afib and atrial flutter ablation by JA  . ABLATION OF DYSRHYTHMIC FOCUS  10/15/09  repeat afib ablation by JA  . APPENDECTOMY    . COLONOSCOPY    . CORNEAL LACERATION REPAIR    . INGUINAL HERNIA REPAIR    . POLYPECTOMY    . TONSILLECTOMY    . VASECTOMY      REVIEW OF SYSTEMS:   Review of Systems  Constitutional: Negative for appetite change, chills, fatigue, fever and unexpected weight change.  HENT:   Negative for mouth sores, nosebleeds, sore throat and trouble swallowing.   Eyes: Negative for eye problems and icterus.  Respiratory: Negative for hemoptysis, shortness of breath and wheezing.  Positive for nonproductive cough. Cardiovascular: Negative for chest pain and leg swelling.  Gastrointestinal: Negative for abdominal pain, constipation, diarrhea, nausea and vomiting.  Genitourinary: Negative for bladder incontinence, difficulty urinating, dysuria, frequency  and hematuria.   Musculoskeletal: Negative for back pain, gait problem, neck pain and neck stiffness.  Skin: Negative for itching and rash.  Neurological: Negative for dizziness, extremity weakness, gait problem, headaches, light-headedness and seizures.  Hematological: Negative for adenopathy. Does not bruise/bleed easily.  Psychiatric/Behavioral: Negative for confusion, depression and sleep disturbance. The patient is not nervous/anxious.     PHYSICAL EXAMINATION:  Blood pressure 132/77, pulse 63, temperature 97.8 F (36.6 C), temperature source Oral, resp. rate 18, height 5\' 10"  (1.778 m), weight 176 lb 1.6 oz (79.9 kg), SpO2 99 %.  ECOG PERFORMANCE STATUS: 1 - Symptomatic but completely ambulatory  Physical Exam  Constitutional: Oriented to person, place, and time and well-developed, well-nourished, and in no distress. No distress.  HENT:  Head: Normocephalic and atraumatic.  Mouth/Throat: Oropharynx is clear and moist. No oropharyngeal exudate.  Eyes: Conjunctivae are normal. Right eye exhibits no discharge. Left eye exhibits no discharge. No scleral icterus.  Neck: Normal range of motion. Neck supple.  Cardiovascular: Normal rate, regular rhythm, normal heart sounds and intact distal pulses.   Pulmonary/Chest: Effort normal and breath sounds normal. No respiratory distress. No wheezes. No rales.  Abdominal: Soft. Bowel sounds are normal. Exhibits no distension and no mass. There is no tenderness.  Musculoskeletal: Normal range of motion. Exhibits no edema.  Lymphadenopathy:    No cervical adenopathy.  Neurological: Alert and oriented to person, place, and time. Exhibits normal muscle tone. Gait normal. Coordination normal.  Skin: Skin is warm and dry. No rash noted. Not diaphoretic. No erythema. No pallor.  Psychiatric: Mood, memory and judgment normal.  Vitals reviewed.  LABORATORY DATA: Lab Results  Component Value Date   WBC 7.4 04/29/2017   HGB 14.7 04/29/2017   HCT  42.6 04/29/2017   MCV 88.9 04/29/2017   PLT 339 04/29/2017      Chemistry      Component Value Date/Time   NA 141 04/29/2017 1219   K 4.7 04/29/2017 1219   CL 108 04/29/2017 1219   CO2 26 04/29/2017 1219   BUN 13 04/29/2017 1219   CREATININE 1.04 04/29/2017 1219      Component Value Date/Time   CALCIUM 10.0 04/29/2017 1219   ALKPHOS 60 04/29/2017 1219   AST 19 04/29/2017 1219   ALT 20 04/29/2017 1219   BILITOT 0.7 04/29/2017 1219       RADIOGRAPHIC STUDIES:  Dg Chest 2 View  Result Date: 04/30/2017 CLINICAL DATA:  Cough and congestion. EXAM: CHEST - 2 VIEW COMPARISON:  01/21/2009. FINDINGS: Mediastinum and hilar structures normal. Cardiomegaly with normal pulmonary vascularity. No focal infiltrate. No pleural effusion or pneumothorax. Thoracic spine scoliosis. IMPRESSION: Cardiomegaly. No pulmonary venous congestion. No acute pulmonary infiltrate. Electronically  Signed   By: Marcello Moores  Register   On: 04/30/2017 07:14     ASSESSMENT/PLAN:  Polycythemia vera ([place]) This is a very pleasant 82 year old white male with recently diagnosed polycythemia vera with positive Jak 2 mutation.  The patient is also currently on hormonal therapy for androgen deficiency. The patient receives phlebotomy on as-needed basis to keep his hematocrit around 45%. Labs reviewed with the patient.  Hematocrit is 42.6%.  Will hold on phlebotomy at this time. He will return in approximately 1 month for evaluation and repeat lab work.  For the persistent cough, I have reordered a chest x-ray and also advised the patient to use over-the-counter Delsym.  We will contact the patient with the chest x-ray results when they are available.  He was advised to call if he has any concerning symptoms in the interval. The patient voices understanding of current disease status and treatment options and is in agreement with the current care plan.  All questions were answered. The patient knows to call the clinic with  any problems, questions or concerns. We can certainly see the patient much sooner if necessary.   Orders Placed This Encounter  Procedures  . DG Chest 2 View    Standing Status:   Future    Number of Occurrences:   1    Standing Expiration Date:   04/29/2018    Order Specific Question:   Reason for exam:    Answer:   Cough. Eval for pneumonia.    Order Specific Question:   Preferred imaging location?    Answer:   [facility]  . CBC with Differential ([facility] Only)    Standing Status:   Future    Standing Expiration Date:   04/30/2018  . CMP ([place] only)    Standing Status:   Future    Standing Expiration Date:   04/30/2018  . Lactate dehydrogenase    Standing Status:   Future    Standing Expiration Date:   04/30/2018   Mikey Bussing, DNP, AGPCNP-BC, AOCNP 04/30/17

## 2017-04-30 ENCOUNTER — Encounter: Payer: Self-pay | Admitting: Oncology

## 2017-04-30 ENCOUNTER — Telehealth: Payer: Self-pay | Admitting: *Deleted

## 2017-04-30 NOTE — Telephone Encounter (Signed)
Called pt, unable to reach. LMOVM with instructions and results. Encouraged pt to call back with concerns.

## 2017-04-30 NOTE — Telephone Encounter (Signed)
-----   Message from Maryanna Shape, NP sent at 04/30/2017 12:32 PM EDT ----- [facility]  Please call this patient and let him know that his chest x-ray looks okay.  There is no infection or other cause for his cough.  Cough could be related to postnasal drip.  Recommend that he use over-the-counter Delsym as needed.

## 2017-04-30 NOTE — Assessment & Plan Note (Signed)
This is a very pleasant 82 year old white male with recently diagnosed polycythemia vera with positive Jak 2 mutation.  The patient is also currently on hormonal therapy for androgen deficiency. The patient receives phlebotomy on as-needed basis to keep his hematocrit around 45%. Labs reviewed with the patient.  Hematocrit is 42.6%.  Will hold on phlebotomy at this time. He will return in approximately 1 month for evaluation and repeat lab work.  For the persistent cough, I have reordered a chest x-ray and also advised the patient to use over-the-counter Delsym.  We will contact the patient with the chest x-ray results when they are available.  He was advised to call if he has any concerning symptoms in the interval. The patient voices understanding of current disease status and treatment options and is in agreement with the current care plan.  All questions were answered. The patient knows to call the clinic with any problems, questions or concerns. We can certainly see the patient much sooner if necessary.

## 2017-05-05 ENCOUNTER — Other Ambulatory Visit (INDEPENDENT_AMBULATORY_CARE_PROVIDER_SITE_OTHER): Payer: Self-pay | Admitting: Orthopaedic Surgery

## 2017-05-18 ENCOUNTER — Telehealth (INDEPENDENT_AMBULATORY_CARE_PROVIDER_SITE_OTHER): Payer: Self-pay | Admitting: Physical Medicine and Rehabilitation

## 2017-05-18 NOTE — Telephone Encounter (Signed)
If recent increase in back pain then ok to OV with triggerpoint, if pain down leg let me know

## 2017-05-18 NOTE — Telephone Encounter (Signed)
Left message for patient to call back to discuss/ schedule. 

## 2017-05-19 NOTE — Telephone Encounter (Signed)
Patient called back and left message. I returned his call and left message #2.

## 2017-05-19 NOTE — Telephone Encounter (Signed)
Patient reports no leg pain. Scheduled for an OV 5/23 at 0900.

## 2017-05-26 ENCOUNTER — Inpatient Hospital Stay: Payer: Medicare HMO | Attending: Internal Medicine

## 2017-05-26 ENCOUNTER — Telehealth: Payer: Self-pay | Admitting: Internal Medicine

## 2017-05-26 ENCOUNTER — Encounter: Payer: Self-pay | Admitting: Oncology

## 2017-05-26 ENCOUNTER — Inpatient Hospital Stay (HOSPITAL_BASED_OUTPATIENT_CLINIC_OR_DEPARTMENT_OTHER): Payer: Medicare HMO | Admitting: Oncology

## 2017-05-26 ENCOUNTER — Inpatient Hospital Stay: Payer: Medicare HMO

## 2017-05-26 VITALS — BP 129/90 | HR 80 | Temp 97.6°F | Resp 18 | Ht 70.0 in | Wt 176.0 lb

## 2017-05-26 VITALS — BP 146/89 | HR 77 | Temp 98.7°F | Resp 18

## 2017-05-26 DIAGNOSIS — E785 Hyperlipidemia, unspecified: Secondary | ICD-10-CM

## 2017-05-26 DIAGNOSIS — Z79899 Other long term (current) drug therapy: Secondary | ICD-10-CM | POA: Insufficient documentation

## 2017-05-26 DIAGNOSIS — Z85828 Personal history of other malignant neoplasm of skin: Secondary | ICD-10-CM | POA: Insufficient documentation

## 2017-05-26 DIAGNOSIS — M109 Gout, unspecified: Secondary | ICD-10-CM

## 2017-05-26 DIAGNOSIS — D45 Polycythemia vera: Secondary | ICD-10-CM

## 2017-05-26 DIAGNOSIS — N400 Enlarged prostate without lower urinary tract symptoms: Secondary | ICD-10-CM | POA: Diagnosis not present

## 2017-05-26 DIAGNOSIS — Z8673 Personal history of transient ischemic attack (TIA), and cerebral infarction without residual deficits: Secondary | ICD-10-CM | POA: Insufficient documentation

## 2017-05-26 DIAGNOSIS — K219 Gastro-esophageal reflux disease without esophagitis: Secondary | ICD-10-CM | POA: Insufficient documentation

## 2017-05-26 DIAGNOSIS — I10 Essential (primary) hypertension: Secondary | ICD-10-CM

## 2017-05-26 DIAGNOSIS — Z7982 Long term (current) use of aspirin: Secondary | ICD-10-CM

## 2017-05-26 LAB — CMP (CANCER CENTER ONLY)
ALBUMIN: 4.6 g/dL (ref 3.5–5.0)
ALK PHOS: 65 U/L (ref 40–150)
ALT: 23 U/L (ref 0–55)
AST: 21 U/L (ref 5–34)
Anion gap: 8 (ref 3–11)
BUN: 14 mg/dL (ref 7–26)
CALCIUM: 10.1 mg/dL (ref 8.4–10.4)
CO2: 25 mmol/L (ref 22–29)
CREATININE: 1.04 mg/dL (ref 0.70–1.30)
Chloride: 108 mmol/L (ref 98–109)
GFR, Est AFR Am: >60 mL/min (ref >60)
GFR, Estimated: >60 mL/min (ref >60)
Glucose, Bld: 65 mg/dL — ABNORMAL LOW (ref 70–140)
Potassium: 4.4 mmol/L (ref 3.5–5.1)
SODIUM: 141 mmol/L (ref 136–145)
Total Bilirubin: 0.6 mg/dL (ref 0.2–1.2)
Total Protein: 7.1 g/dL (ref 6.4–8.3)

## 2017-05-26 LAB — CBC WITH DIFFERENTIAL (CANCER CENTER ONLY)
BASOS PCT: 1 %
Basophils Absolute: 0.1 10*3/uL (ref 0.0–0.1)
Eosinophils Absolute: 0.2 10*3/uL (ref 0.0–0.5)
Eosinophils Relative: 2 %
HEMATOCRIT: 47.4 % (ref 38.4–49.9)
Hemoglobin: 16 g/dL (ref 13.0–17.1)
LYMPHS ABS: 1.7 10*3/uL (ref 0.9–3.3)
Lymphocytes Relative: 18 %
MCH: 30.1 pg (ref 27.2–33.4)
MCHC: 33.8 g/dL (ref 32.0–36.0)
MCV: 89 fL (ref 79.3–98.0)
MONO ABS: 0.8 10*3/uL (ref 0.1–0.9)
MONOS PCT: 8 %
Neutro Abs: 6.4 10*3/uL (ref 1.5–6.5)
Neutrophils Relative %: 71 %
Platelet Count: 366 10*3/uL (ref 140–400)
RBC: 5.32 MIL/uL (ref 4.20–5.82)
RDW: 13.9 % (ref 11.0–14.6)
WBC Count: 9.1 10*3/uL (ref 4.0–10.3)

## 2017-05-26 LAB — LACTATE DEHYDROGENASE: LDH: 147 U/L (ref 125–245)

## 2017-05-26 NOTE — Progress Notes (Signed)
Dean Pratt presents today for phlebotomy per MD orders. Phlebotomy procedure started at 1440 via 16 G phlebotomy needle and ended at 1446. 520 grams removed. Patient observed for 30 minutes after procedure without any incident. Patient tolerated procedure well. IV needle removed intact.  Pt VSS and discharged home.

## 2017-05-26 NOTE — Patient Instructions (Signed)

## 2017-05-26 NOTE — Progress Notes (Signed)
[facility] OFFICE PROGRESS NOTE  [name], MD [place] [address]  DIAGNOSIS: Polycythemiavera with positive JAK2 mutation.  PRIOR THERAPY: None.  CURRENT THERAPY: Phlebotomy on as-needed basis.  INTERVAL HISTORY: Dean Pratt 82 y.o. male returns for routine follow-up visit by himself.  Patient is feeling fine today and has no specific complaints.  The patient denies fevers and chills.  Denies chest pain, shortness breath, cough, hemoptysis.  Denies nausea, vomiting, constipation, diarrhea.  Denies recent weight loss or night sweats.  Patient is here for evaluation and repeat lab work.  MEDICAL HISTORY: Past Medical History:  Diagnosis Date  . Anxiety   . Atrial flutter ([place])    s/p afib and atrial flutter ablation 04/09/09  . BPH (benign prostatic hyperplasia)   . Cancer ([place])    basal cell CA removed  . Colitis, ischemic ([place])    secondary to ebolism from afib 1/10  . Constipation   . GERD (gastroesophageal reflux disease)   . Gout   . HTN (hypertension)   . Hyperlipidemia   . Intracranial bleed ([place]) 04/10/10   Right occipital hematoma with a left homonymous hemianopsia   . Migraines   . Paroxysmal atrial fibrillation ([place])    s/p PVI 04/09/09 and 10/17/10  . Rosacea   . Stroke ([place]) 04/08/2010   hemorragic, anticoagulation stopped at that time  . Tuberculosis    s/p treatment 1964    ALLERGIES:  is allergic to ace inhibitors and warfarin and related.  MEDICATIONS:  Current Outpatient Medications  Medication Sig Dispense Refill  . acetaminophen (TYLENOL) 325 MG tablet Take 650 mg by mouth every 6 (six) hours as needed.      Marland Kitchen allopurinol (ZYLOPRIM) 100 MG tablet Take 100 mg by mouth at bedtime.      . Ascorbic Acid (VITAMIN C) 1000 MG tablet Take 1,000 mg by mouth daily.     Marland Kitchen aspirin 81 MG tablet Take 81 mg by mouth daily.      . diclofenac sodium (VOLTAREN) 1 % GEL APPLY 2-4 GRAMS 2-3 TIMES DAILY AS NEEDED FOR PAIN 100 g 0  .  diltiazem (CARDIZEM CD) 240 MG 24 hr capsule Take 1 tablet by mouth daily.    Marland Kitchen diltiazem (TIAZAC) 240 MG 24 hr capsule Take by mouth daily.  3  . fluorometholone (FML) 0.1 % ophthalmic suspension SHAKE LQ AND INT 1 GTT IN OU QID  0  . ibuprofen (ADVIL,MOTRIN) 200 MG tablet Take 200 mg by mouth every 6 (six) hours as needed.    Marland Kitchen LORazepam (ATIVAN) 1 MG tablet TAKE 1/2-1 TABLET TWICE DAILY AS NEEDED  3  . losartan (COZAAR) 25 MG tablet Take 25 mg by mouth daily.      . methocarbamol (ROBAXIN) 500 MG tablet Take 1 tablet (500 mg total) by mouth every 8 (eight) hours as needed for muscle spasms. 60 tablet 0  . NON FORMULARY Stool softener 100 mg... Take 1 capsule by mouth once daily.      No current facility-administered medications for this visit.     SURGICAL HISTORY:  Past Surgical History:  Procedure Laterality Date  . ablation  04/09/2009   s/p afib and atrial flutter ablation by [facility]  . ABLATION OF DYSRHYTHMIC FOCUS  10/15/09   repeat afib ablation by JA  . APPENDECTOMY    . COLONOSCOPY    . CORNEAL LACERATION REPAIR    . INGUINAL HERNIA REPAIR    . POLYPECTOMY    .  TONSILLECTOMY    . VASECTOMY      REVIEW OF SYSTEMS:   Review of Systems  Constitutional: Negative for appetite change, chills, fatigue, fever and unexpected weight change.  HENT:   Negative for mouth sores, nosebleeds, sore throat and trouble swallowing.   Eyes: Negative for eye problems and icterus.  Respiratory: Negative for cough, hemoptysis, shortness of breath and wheezing.   Cardiovascular: Negative for chest pain and leg swelling.  Gastrointestinal: Negative for abdominal pain, constipation, diarrhea, nausea and vomiting.  Genitourinary: Negative for bladder incontinence, difficulty urinating, dysuria, frequency and hematuria.   Musculoskeletal: Negative for back pain, gait problem, neck pain and neck stiffness.  Skin: Negative for itching and rash.  Neurological: Negative for dizziness, extremity weakness,  gait problem, headaches, light-headedness and seizures.  Hematological: Negative for adenopathy. Does not bruise/bleed easily.  Psychiatric/Behavioral: Negative for confusion, depression and sleep disturbance. The patient is not nervous/anxious.     PHYSICAL EXAMINATION:  Blood pressure 129/90, pulse 80, temperature 97.6 F (36.4 C), temperature source Oral, resp. rate 18, height 5\' 10"  (1.778 m), weight 176 lb (79.8 kg), SpO2 98 %.  ECOG PERFORMANCE STATUS: 1 - Symptomatic but completely ambulatory  Physical Exam  Constitutional: Oriented to person, place, and time and well-developed, well-nourished, and in no distress. No distress.  HENT:  Head: Normocephalic and atraumatic.  Mouth/Throat: Oropharynx is clear and moist. No oropharyngeal exudate.  Eyes: Conjunctivae are normal. Right eye exhibits no discharge. Left eye exhibits no discharge. No scleral icterus.  Neck: Normal range of motion. Neck supple.  Cardiovascular: Normal rate, regular rhythm, normal heart sounds and intact distal pulses.   Pulmonary/Chest: Effort normal and breath sounds normal. No respiratory distress. No wheezes. No rales.  Abdominal: Soft. Bowel sounds are normal. Exhibits no distension and no mass. There is no tenderness.  Musculoskeletal: Normal range of motion. Exhibits no edema.  Lymphadenopathy:    No cervical adenopathy.  Neurological: Alert and oriented to person, place, and time. Exhibits normal muscle tone. Gait normal. Coordination normal.  Skin: Skin is warm and dry. No rash noted. Not diaphoretic. No erythema. No pallor.  Psychiatric: Mood, memory and judgment normal.  Vitals reviewed.  LABORATORY DATA: Lab Results  Component Value Date   WBC 9.1 05/26/2017   HGB 16.0 05/26/2017   HCT 47.4 05/26/2017   MCV 89.0 05/26/2017   PLT 366 05/26/2017      Chemistry      Component Value Date/Time   NA 141 05/26/2017 1305   K 4.4 05/26/2017 1305   CL 108 05/26/2017 1305   CO2 25 05/26/2017  1305   BUN 14 05/26/2017 1305   CREATININE 1.04 05/26/2017 1305      Component Value Date/Time   CALCIUM 10.1 05/26/2017 1305   ALKPHOS 65 05/26/2017 1305   AST 21 05/26/2017 1305   ALT 23 05/26/2017 1305   BILITOT 0.6 05/26/2017 1305       RADIOGRAPHIC STUDIES:  Dg Chest 2 View  Result Date: 04/30/2017 CLINICAL DATA:  Cough and congestion. EXAM: CHEST - 2 VIEW COMPARISON:  01/21/2009. FINDINGS: Mediastinum and hilar structures normal. Cardiomegaly with normal pulmonary vascularity. No focal infiltrate. No pleural effusion or pneumothorax. Thoracic spine scoliosis. IMPRESSION: Cardiomegaly. No pulmonary venous congestion. No acute pulmonary infiltrate. Electronically Signed   By: Marcello Moores  Register   On: 04/30/2017 07:14     ASSESSMENT/PLAN:  Polycythemia vera ([place]) This is a very pleasant 82 year old white male with recently diagnosed polycythemia vera with positive Jak 2  mutation. The patient is also currently on hormonal therapy for androgen deficiency. The patient receives phlebotomy on as-needed basis to keep his hematocrit around 45%. He is here for repeat lab work and possible phlebotomy.  The patient was seen with Dr. Earlie Server.  Labs reviewed.  Hematocrit is 47.4%.  Proceed with phlebotomy as scheduled.   The patient will follow-up in 3 months for evaluation and repeat lab work.  We will schedule possible phlebotomy on the day of his next visit.  Blood sugar is low today.  He has not eaten since breakfast.  He is asymptomatic.  He was given a snack and drink while awaiting his phlebotomy appointment.  He was advised to call if he has any concerning symptoms in the interval. The patient voices understanding of current disease status and treatment options and is in agreement with the current care plan.  All questions were answered. The patient knows to call the clinic with any problems, questions or concerns. We can certainly see the patient much sooner if  necessary.   Orders Placed This Encounter  Procedures  . CBC with Differential ([facility] Only)    Standing Status:   Future    Standing Expiration Date:   05/27/2018  . CMP ([facility] only)    Standing Status:   Future    Standing Expiration Date:   05/27/2018  . Lactate dehydrogenase    Standing Status:   Future    Standing Expiration Date:   05/27/2018   Mikey Bussing, DNP, AGPCNP-BC, AOCNP 05/26/17  ADDENDUM: Hematology/Oncology Attending: I had a face-to-face encounter with the patient today.  I recommended his care plan.  This is a very pleasant 82 years old white male with polycythemia vera and positive Jak 2 mutation.  The patient is doing fine today with no specific complaints.  Repeat CBC performed earlier today showed hematocrit of 47.4%.  We will arrange for the patient to receive phlebotomy today to keep his hematocrit around 45%.  The patient will come back for follow-up visit in 3 months for evaluation and repeat blood work.  He was advised to call immediately if he has any concerning symptoms in the interval.  Disclaimer: This note was dictated with voice recognition software. Similar sounding words can inadvertently be transcribed and may be missed upon review. Eilleen Kempf, MD 05/26/17

## 2017-05-26 NOTE — Assessment & Plan Note (Addendum)
This is a very pleasant 82 year old white male with recently diagnosed polycythemia vera with positive Jak 2 mutation. The patient is also currently on hormonal therapy for androgen deficiency. The patient receives phlebotomy on as-needed basis to keep his hematocrit around 45%. He is here for repeat lab work and possible phlebotomy.  The patient was seen with Dr. Earlie Server.  Labs reviewed.  Hematocrit is 47.4%.  Proceed with phlebotomy as scheduled.   The patient will follow-up in 3 months for evaluation and repeat lab work.  We will schedule possible phlebotomy on the day of his next visit.  Blood sugar is low today.  He has not eaten since breakfast.  He is asymptomatic.  He was given a snack and drink while awaiting his phlebotomy appointment.  He was advised to call if he has any concerning symptoms in the interval. The patient voices understanding of current disease status and treatment options and is in agreement with the current care plan.  All questions were answered. The patient knows to call the clinic with any problems, questions or concerns. We can certainly see the patient much sooner if necessary.

## 2017-05-26 NOTE — Telephone Encounter (Signed)
Appointments scheduled AVS/Calendar printed per 5/22 los °

## 2017-05-27 ENCOUNTER — Encounter (INDEPENDENT_AMBULATORY_CARE_PROVIDER_SITE_OTHER): Payer: Self-pay | Admitting: Physical Medicine and Rehabilitation

## 2017-05-27 ENCOUNTER — Ambulatory Visit (INDEPENDENT_AMBULATORY_CARE_PROVIDER_SITE_OTHER): Payer: Medicare HMO | Admitting: Physical Medicine and Rehabilitation

## 2017-05-27 VITALS — BP 116/79 | HR 67 | Temp 97.8°F

## 2017-05-27 DIAGNOSIS — M5136 Other intervertebral disc degeneration, lumbar region: Secondary | ICD-10-CM

## 2017-05-27 DIAGNOSIS — M5441 Lumbago with sciatica, right side: Secondary | ICD-10-CM | POA: Diagnosis not present

## 2017-05-27 DIAGNOSIS — G8929 Other chronic pain: Secondary | ICD-10-CM | POA: Diagnosis not present

## 2017-05-27 DIAGNOSIS — M47816 Spondylosis without myelopathy or radiculopathy, lumbar region: Secondary | ICD-10-CM

## 2017-05-27 DIAGNOSIS — M7918 Myalgia, other site: Secondary | ICD-10-CM | POA: Diagnosis not present

## 2017-05-27 NOTE — Progress Notes (Signed)
  Numeric Pain Rating Scale and Functional Assessment Average Pain 5 Pain Right Now 1 My pain is intermittent, constant, dull and aching Pain is worse with: bending Pain improves with: rest   In the last MONTH (on 0-10 scale) has pain interfered with the following?  1. General activity like being  able to carry out your everyday physical activities such as walking, climbing stairs, carrying groceries, or moving a chair?  Rating(3)  2. Relation with others like being able to carry out your usual social activities and roles such as  activities at home, at work and in your community. Rating(4)  3. Enjoyment of life such that you have  been bothered by emotional problems such as feeling anxious, depressed or irritable?  Rating(0)

## 2017-05-27 NOTE — Progress Notes (Signed)
Dean Pratt - 82 y.o. male MRN [id number]  Date of birth: 04/22/1935  Office Visit Note: Visit Date: 05/27/2017 PCP: [name], MD Referred by: [name], MD  Subjective: Chief Complaint  Patient presents with  . Lower Back - Pain  . Right Leg - Pain   HPI: Dean Pratt is a 82 year old gentleman that used to see Dr. Telford Nab at the sports [place] and who I have seen on a very infrequent basis for his low back and right leg pain.  Last time I saw [name] was approximately a year ago.  At that time we completed a left-sided trigger point injection in the paraspinal and quadratus lumborum area.  He had more of a stress strain injury at the time with increased activity and worsening left-sided back pain.  He comes in today with more of his typical pain which is been chronic low back which is bilateral across the beltline worse with standing and better at rest along with occasional right leg pain.  The right leg pain is more of an L4-L5 distribution but mainly into the lateral thigh to about the knee.  He denies any paresthesia or weakness.  He really states this is been ongoing for a couple of years but injections have helped in the past.  He feels like he has been coming on the last several months to the point where he was wondering about getting something done.  He does not endorse any new injury although he has had some periods of time with extra work and activity.  He still tries to maintain his activity level by walking 2 to 3 miles.  He is 82 years old.  Since I have seen him last has been diagnosed with polycythemia vera and is undergoing therapeutic phlebotomies and that seems to be working fairly well.  He reports a lot of pain with bending forward and making his bed in the morning.  He really does not do anything that makes it much better other than resting and sitting.  He reports this is intermittent constant dull and aching.  He has not noted any focal weakness or  foot drop or bowel or bladder changes or changes in weight.  No focal night pain.   Review of Systems  Constitutional: Positive for malaise/fatigue. Negative for chills, fever and weight loss.  HENT: Negative for hearing loss and sinus pain.   Eyes: Negative for blurred vision, double vision and photophobia.  Respiratory: Negative for cough and shortness of breath.   Cardiovascular: Negative for chest pain, palpitations and leg swelling.  Gastrointestinal: Negative for abdominal pain, nausea and vomiting.  Genitourinary: Negative for flank pain.  Musculoskeletal: Positive for back pain and joint pain. Negative for myalgias.       Right leg pain  Skin: Negative for itching and rash.  Neurological: Negative for tremors, focal weakness and weakness.  Endo/Heme/Allergies: Negative.   Psychiatric/Behavioral: Negative for depression.  All other systems reviewed and are negative.  Otherwise per HPI.  Assessment & Plan: Visit Diagnoses:  1. Chronic bilateral low back pain with right-sided sciatica   2. Spondylosis without myelopathy or radiculopathy, lumbar region   3. Myofascial pain syndrome   4. Degenerative disc disease, lumbar   5. Other intervertebral disc degeneration, lumbar region     Plan: Findings:  Chronic history of low back pain and chronic pain in general but mostly low back pain mechanical in nature which is always been felt to be somewhat facet mediated  pain as well as radicular leg pain.  Prior MRI from 2012 showed foraminal stenosis on the right at L4 along with moderate facet joint hypertrophy at L3-4 and L4-5 and L5-S1.  Very mild broad bulging.  He had no focal canal stenosis or lateral recess stenosis.  I think his symptoms are multifactorial I think he does have some myofascial pain and clearly facet mediated joint pain.  The radicular pain is still been present and could be from the stenosis at L4 which is more foraminal but I think at this point given the fact that his  MRI was from 2012 we should repeat that at least one time to see if there is been any changes from a stenosis standpoint or change with disc herniation.  Increasing pain with forward flexion makes you concerned that may be there is a disc protrusion that would is not aware of.  He does want to go along with getting a new MRI to see and make sure it was not anything fairly bad.  At this point we will schedule the MRI and depending on what it shows we may have him come in for review and possible injection at that time.  Right now he is going to take Tylenol 3 times per day as a scheduled basis to see if that helps.  He has been unable to really take anti-inflammatories in the past.  He can take those intermittently if he would like.  He does not really have any condition that would be appropriate for chronic opioid treatment and he does not desire that.    Meds & Orders: No orders of the defined types were placed in this encounter.   Orders Placed This Encounter  Procedures  . MR LUMBAR SPINE WO CONTRAST    Follow-up: Return for MRI review after completion.   Procedures: No procedures performed  No notes on file   Clinical History: MRI LUMBAR SPINE WITHOUT CONTRAST 10/2010   Technique: Multiplanar and multiecho pulse sequences of the lumbar spine were obtained without intravenous contrast.   Comparison: CT abdomen and pelvis 02/28/2008.   Findings: Normal signal is present in the conus medullaris which terminates at L1, within normal limits. Slight degenerative retrolisthesis is present at L1-2, L2-3, and L3-4. Minimal end plate marrow changes are noted at L1-2. Marrow signal is otherwise normal. The vertebral body heights are maintained. Limited imaging of the abdomen is unremarkable.   T12-L1: A slight disc extrusion extends superiorly in the midline without significant impact on the central canal. The foramina are patent bilaterally.   L1-2: Mild broad-based disc bulging is  present. Facet hypertrophy is worse on the right. There is no significant stenosis.   L2-3: A broad-based disc bulge is present. Mild facet hypertrophy is noted bilaterally. There is mild lateral recess narrowing, worse on the left. The disc bulges into the inferior aspect of both neural foramina with mild narrowing.   L3-4: A broad-based disc bulge is present. Moderate facet hypertrophy is evident. Mild lateral recess narrowing is worse on the right. Mild foraminal narrowing is present bilaterally.   L4-5: A broad-based disc bulge is present. This is asymmetric to the right. Mild facet hypertrophy is present. There is mild lateral recess narrowing bilaterally. Moderate right foraminal stenosis is present. There is minimal encroachment on the left foramen.   L5-S1: Mild disc bulging and facet hypertrophy is present. There is no focal stenosis.   IMPRESSION:   1. The most significant right-sided disease is foraminal stenosis at  L4-5. 2. Mild lateral recess narrowing at L2-3 is worse on the left. 3. Mild lateral recess narrowing at L3-4 is worse on the right. 4. Mild foraminal narrowing bilaterally at L2-3 and L3-4. 5. Small disc extrusion superiorly at T12-L1 without significant stenosis. 6. Mild disc bulging and facet disease at L1-2 and L5-S1 without significant stenosis.   He reports that he has quit smoking. He has never used smokeless tobacco. No results for input(s): HGBA1C, LABURIC in the last 8760 hours.  Objective:  VS:  HT:    WT:   BMI:     BP:116/79  HR:67bpm  TEMP:97.8 F (36.6 C)(Oral)  RESP:97 % Physical Exam  Constitutional: He is oriented to person, place, and time. He appears well-developed and well-nourished. No distress.  HENT:  Head: Normocephalic and atraumatic.  Nose: Nose normal.  Mouth/Throat: Oropharynx is clear and moist.  Eyes: Pupils are equal, round, and reactive to light. Conjunctivae are normal.  Wears glasses  Neck: Normal range of  motion. Neck supple. No tracheal deviation present.  Cardiovascular: Regular rhythm and intact distal pulses.  Pulmonary/Chest: Effort normal and breath sounds normal.  Abdominal: Soft. He exhibits distension. There is no rebound and no guarding.  Musculoskeletal: He exhibits no deformity.  Patient ambulates without aid.  He is slow to rise from a full extension from sitting.  He does have pain with extension rotation bilaterally.  He does have some increased kyphosis in the upper thoracic region.  No overt scoliosis.  He has no pain over the greater trochanters or PSIS.  He has good distal strength without clonus bilaterally.  Neurological: He is alert and oriented to person, place, and time. He exhibits normal muscle tone. Coordination normal.  Skin: Skin is warm. No rash noted.  Psychiatric: He has a normal mood and affect. His behavior is normal.  Nursing note and vitals reviewed.   Ortho Exam Imaging: No results found.  Past Medical/Family/Surgical/Social History: Medications & Allergies reviewed per EMR, new medications updated. Patient Active Problem List   Diagnosis Date Noted  . Polycythemia vera ([place]) 04/13/2017  . OTHER AND UNSPECIFIED COAGULATION DEFECTS 01/02/2010  . ATRIAL FIBRILLATION 01/02/2010  . SNORING 02/27/2009  . ISCHEMIC COLITIS 03/27/2008  . ABDOMINAL PAIN-RLQ 03/22/2008  . ABNORMAL FINDINGS GI TRACT 03/22/2008  . PERSONAL HX COLONIC POLYPS 03/22/2008  . COLONIC POLYPS 03/21/2008  . HYPERLIPIDEMIA 03/21/2008  . GOUT 03/21/2008  . ANXIETY 03/21/2008  . MIGRAINE HEADACHE 03/21/2008  . CONSTIPATION, CHRONIC 03/21/2008  . ROSACEA 03/21/2008  . Essential hypertension 03/21/2008  . CARCINOMA, BASAL CELL, HX OF 03/21/2008  . TUBERCULOSIS, HX OF 03/21/2008  . ATRIAL FIBRILLATION, HX OF 03/21/2008  . BENIGN PROSTATIC HYPERTROPHY, HX OF 03/21/2008   Past Medical History:  Diagnosis Date  . Anxiety   . Atrial flutter ([place])    s/p afib and atrial flutter  ablation 04/09/09  . BPH (benign prostatic hyperplasia)   . Cancer ([place])    basal cell CA removed  . Colitis, ischemic ([place])    secondary to ebolism from afib 1/10  . Constipation   . GERD (gastroesophageal reflux disease)   . Gout   . HTN (hypertension)   . Hyperlipidemia   . Intracranial bleed ([place]) 04/10/10   Right occipital hematoma with a left homonymous hemianopsia   . Migraines   . Paroxysmal atrial fibrillation ([employer])    s/p PVI 04/09/09 and 10/17/10  . Rosacea   . Stroke ([place]) 04/08/2010   hemorragic, anticoagulation stopped at that time  .  Tuberculosis    s/p treatment 1964   Family History  Problem Relation Age of Onset  . Liver cancer Father   . Prostate cancer Father   . Coronary artery disease Father   . COPD Mother   . Breast cancer Sister   . Colon cancer Neg Hx   . Rectal cancer Neg Hx   . Stomach cancer Neg Hx   . Esophageal cancer Neg Hx    Past Surgical History:  Procedure Laterality Date  . ablation  04/09/2009   s/p afib and atrial flutter ablation by JA  . ABLATION OF DYSRHYTHMIC FOCUS  10/15/09   repeat afib ablation by [facility]  . APPENDECTOMY    . COLONOSCOPY    . CORNEAL LACERATION REPAIR    . INGUINAL HERNIA REPAIR    . POLYPECTOMY    . TONSILLECTOMY    . VASECTOMY     Social History   Occupational History  . Not on file  Tobacco Use  . Smoking status: Former Research scientist (life sciences)  . Smokeless tobacco: Never Used  Substance and Sexual Activity  . Alcohol use: No  . Drug use: No  . Sexual activity: Not on file

## 2017-06-12 ENCOUNTER — Ambulatory Visit
Admission: RE | Admit: 2017-06-12 | Discharge: 2017-06-12 | Disposition: A | Discharge status: Home / Self Care | Payer: Medicare HMO | Source: Ambulatory Visit | Attending: Physical Medicine and Rehabilitation | Admitting: Physical Medicine and Rehabilitation

## 2017-06-12 DIAGNOSIS — M545 Low back pain: Secondary | ICD-10-CM | POA: Diagnosis not present

## 2017-06-12 DIAGNOSIS — M51369 Other intervertebral disc degeneration, lumbar region without mention of lumbar back pain or lower extremity pain: Secondary | ICD-10-CM

## 2017-06-12 DIAGNOSIS — M5136 Other intervertebral disc degeneration, lumbar region: Secondary | ICD-10-CM

## 2017-06-15 DIAGNOSIS — Z7982 Long term (current) use of aspirin: Secondary | ICD-10-CM | POA: Diagnosis not present

## 2017-06-15 DIAGNOSIS — I4891 Unspecified atrial fibrillation: Secondary | ICD-10-CM | POA: Diagnosis not present

## 2017-06-15 DIAGNOSIS — Z803 Family history of malignant neoplasm of breast: Secondary | ICD-10-CM | POA: Diagnosis not present

## 2017-06-15 DIAGNOSIS — R69 Illness, unspecified: Secondary | ICD-10-CM | POA: Diagnosis not present

## 2017-06-15 DIAGNOSIS — Z809 Family history of malignant neoplasm, unspecified: Secondary | ICD-10-CM | POA: Diagnosis not present

## 2017-06-15 DIAGNOSIS — E785 Hyperlipidemia, unspecified: Secondary | ICD-10-CM | POA: Diagnosis not present

## 2017-06-15 DIAGNOSIS — Z8249 Family history of ischemic heart disease and other diseases of the circulatory system: Secondary | ICD-10-CM | POA: Diagnosis not present

## 2017-06-15 DIAGNOSIS — I10 Essential (primary) hypertension: Secondary | ICD-10-CM | POA: Diagnosis not present

## 2017-06-15 DIAGNOSIS — Z825 Family history of asthma and other chronic lower respiratory diseases: Secondary | ICD-10-CM | POA: Diagnosis not present

## 2017-06-15 DIAGNOSIS — M109 Gout, unspecified: Secondary | ICD-10-CM | POA: Diagnosis not present

## 2017-07-13 ENCOUNTER — Encounter (INDEPENDENT_AMBULATORY_CARE_PROVIDER_SITE_OTHER): Payer: Self-pay | Admitting: Physical Medicine and Rehabilitation

## 2017-07-13 ENCOUNTER — Ambulatory Visit (INDEPENDENT_AMBULATORY_CARE_PROVIDER_SITE_OTHER): Payer: Medicare HMO | Admitting: Physical Medicine and Rehabilitation

## 2017-07-13 ENCOUNTER — Telehealth (INDEPENDENT_AMBULATORY_CARE_PROVIDER_SITE_OTHER): Payer: Self-pay | Admitting: Physical Medicine and Rehabilitation

## 2017-07-13 VITALS — BP 133/81 | HR 58

## 2017-07-13 DIAGNOSIS — M47816 Spondylosis without myelopathy or radiculopathy, lumbar region: Secondary | ICD-10-CM

## 2017-07-13 DIAGNOSIS — M419 Scoliosis, unspecified: Secondary | ICD-10-CM | POA: Diagnosis not present

## 2017-07-13 DIAGNOSIS — M7918 Myalgia, other site: Secondary | ICD-10-CM

## 2017-07-13 DIAGNOSIS — G8929 Other chronic pain: Secondary | ICD-10-CM | POA: Diagnosis not present

## 2017-07-13 DIAGNOSIS — M545 Low back pain: Secondary | ICD-10-CM

## 2017-07-13 MED ORDER — METHOCARBAMOL 500 MG PO TABS: 500.0000 mg | ORAL_TABLET | Freq: Three times a day (TID) | ORAL | 0 refills | Status: DC | PRN

## 2017-07-13 NOTE — Patient Instructions (Signed)

## 2017-07-13 NOTE — Progress Notes (Signed)
  Numeric Pain Rating Scale and Functional Assessment Average Pain 5 Pain Right Now 0 My pain is intermittent and sharp Pain is worse with: bending and standing Pain improves with: rest   In the last MONTH (on 0-10 scale) has pain interfered with the following?  1. General activity like being  able to carry out your everyday physical activities such as walking, climbing stairs, carrying groceries, or moving a chair?  Rating(4)  2. Relation with others like being able to carry out your usual social activities and roles such as  activities at home, at work and in your community. Rating(4)  3. Enjoyment of life such that you have  been bothered by emotional problems such as feeling anxious, depressed or irritable?  Rating(5)

## 2017-07-14 ENCOUNTER — Encounter (INDEPENDENT_AMBULATORY_CARE_PROVIDER_SITE_OTHER): Payer: Self-pay | Admitting: Physical Medicine and Rehabilitation

## 2017-07-14 DIAGNOSIS — Z1283 Encounter for screening for malignant neoplasm of skin: Secondary | ICD-10-CM | POA: Diagnosis not present

## 2017-07-14 DIAGNOSIS — L218 Other seborrheic dermatitis: Secondary | ICD-10-CM | POA: Diagnosis not present

## 2017-07-14 DIAGNOSIS — L570 Actinic keratosis: Secondary | ICD-10-CM | POA: Diagnosis not present

## 2017-07-14 DIAGNOSIS — L308 Other specified dermatitis: Secondary | ICD-10-CM | POA: Diagnosis not present

## 2017-07-14 DIAGNOSIS — X32XXXD Exposure to sunlight, subsequent encounter: Secondary | ICD-10-CM | POA: Diagnosis not present

## 2017-07-14 DIAGNOSIS — L304 Erythema intertrigo: Secondary | ICD-10-CM | POA: Diagnosis not present

## 2017-07-14 NOTE — Progress Notes (Signed)
Dean Pratt - 82 y.o. male MRN [id number]  Date of birth: Jul 07, 1935  Office Visit Note: Visit Date: 07/13/2017 PCP: [name], MD Referred by: [name], MD  Subjective: Chief Complaint  Patient presents with  . Lower Back - Pain   HPI: Dean Pratt is an 82 year old gentleman that we have seen over the last several years for his low back pain and several years ago more radicular leg pain.  Older MRI he had a history of extraforaminal disc protrusion at L4-5 on the right.  Lately he has had more axial low back pain which is been worse with standing particularly if standing and leaning forward.  Better with sitting.  He did get some relief with muscle relaxer when we saw him back in March.  At that time we felt like he had more of a sprain strain type of activity from movement.  We did complete a trigger point injection at that time as well.  He said that did help to a degree but then he called back with worsening symptoms and we did update his MRI because last MRI was in 2012 we had a concern for stenosis.  He comes in today for continued chronic worsening severe axial low back pain no real leg pain no radicular pain down the legs or paresthesias no focal weakness.  He has had no bowel or bladder changes or weight loss.  He does carry a diagnosis now with polycythemia vera and is getting therapeutic phlebotomy.  He reports prior history of physical therapy and continues to try to do some of those exercises at times but he tends to do them when he is hurting and not necessarily as a preventative nature.  He has had no new falls or trauma since I have seen him last.  New MRI is reviewed with the patient using models and images and is reviewed below.  Basically there is not much change other than the disc protrusion at L4-5 has actually lessened and there is less foraminal narrowing.  He has some degenerative disc changes as well as facet arthropathy particularly L2-3 and L4-5.  He continues to have  scoliosis.   Review of Systems  Constitutional: Positive for malaise/fatigue. Negative for chills, fever and weight loss.  HENT: Negative for hearing loss and sinus pain.   Eyes: Negative for blurred vision, double vision and photophobia.  Respiratory: Negative for cough and shortness of breath.   Cardiovascular: Negative for chest pain, palpitations and leg swelling.  Gastrointestinal: Negative for abdominal pain, nausea and vomiting.  Genitourinary: Negative for flank pain.  Musculoskeletal: Positive for back pain. Negative for myalgias.  Skin: Negative for itching and rash.  Neurological: Negative for tremors, focal weakness and weakness.  Endo/Heme/Allergies: Negative.   Psychiatric/Behavioral: Negative for depression.  All other systems reviewed and are negative.  Otherwise per HPI.  Assessment & Plan: Visit Diagnoses:  1. Spondylosis without myelopathy or radiculopathy, lumbar region   2. Chronic bilateral low back pain without sciatica   3. Myofascial pain syndrome   4. Scoliosis of thoracolumbar spine, unspecified scoliosis type     Plan: Findings:  Chronic mechanical low back pain seemingly mostly related to a combination of facet arthropathy with pain with facet joint loading and not getting much relief with medication and past physical therapy and continued use of some other exercises.  He clearly has some scoliosis and type paraspinal musculature because of this.  Prior trigger point injections seem to help some in March.  He does have a focal tender area and trigger point on the right in the quadratus lumborum.  He has no radicular leg pain and MRI which was updated does not show any focal stenosis and improved foraminal narrowing.  At this point I think the right approach is diagnostic medial branch blocks at L2-3 and L3-4 and L4-5.  Depending on the results of the medial branch blocks which would be done with fluoroscopic guidance we would look at a second diagnostic block  and a double block paradigm with a goal of looking at radiofrequency ablation as a more definitive treatment.  We have also given him some exercises today for core strengthening and he will start doing those.  We are also going to refill the Robaxin as he seemed to have some relief with that.  In the future would consider trigger point injection once again.    Meds & Orders:  Meds ordered this encounter  Medications  . methocarbamol (ROBAXIN) 500 MG tablet    Sig: Take 1 tablet (500 mg total) by mouth every 8 (eight) hours as needed for muscle spasms.    Dispense:  60 tablet    Refill:  0   No orders of the defined types were placed in this encounter.   Follow-up: Return for medial branch blocks.   Procedures: No procedures performed  No notes on file   Clinical History: MRI LUMBAR SPINE WITHOUT CONTRAST  TECHNIQUE: Multiplanar, multisequence MR imaging of the lumbar spine was performed. No intravenous contrast was administered.  COMPARISON:  Lumbar MRI 10/16/2010.  Abdominal CT 02/28/2008.  FINDINGS: Segmentation: Conventional anatomy assumed, with the last open disc space designated L5-S1.This numbering is concordant with previous imaging.  Alignment: There is a convex right scoliosis centered at L2. The lateral alignment is stable and near anatomic.  Vertebrae: No worrisome osseous lesion, acute fracture or pars defect. There are scattered endplate degenerative changes. The lumbar pedicles are somewhat short on a congenital basis. The visualized sacroiliac joints appear unremarkable.  Conus medullaris: Extends to the T12-L1 level and appears normal.  Paraspinal and other soft tissues: Right renal cysts are partially imaged.  Disc levels:  T12-L1: Chronic disc extrusion with cephalad extension has largely involuted. No mass effect on the distal cord or foraminal compromise.  L1-2: Chronic degenerative disc disease with loss of disc height, annular disc  bulging eccentric to the left and circumferential endplate osteophytes. Mild facet hypertrophy. No significant spinal stenosis or nerve root encroachment.  L2-3: Mildly progressive loss of disc height with annular disc bulging and endplate osteophytes asymmetric to the left. Mild facet and ligamentous hypertrophy. There is mild asymmetric narrowing of the left lateral recess and left foramen which has mildly progressed.  L3-4: Stable mild loss of disc height with annular disc bulging and asymmetric facet hypertrophy on the right. Mild narrowing of the right lateral recess and both foramina.  L4-5: Mildly progressive loss of disc height annular disc bulging and endplate osteophytes asymmetric to the right. Right extraforaminal disc extrusion has involuted with improved right foraminal narrowing, now moderate. There is facet and ligamentous hypertrophy contributing to mild narrowing of the lateral recesses.  L5-S1: Stable chronic degenerative disc disease with loss of disc height and annular disc bulging eccentric the right. Mild facet hypertrophy. Stable mild right-greater-than-left foraminal narrowing.  IMPRESSION: 1. Compared with the prior study from 2012, no acute findings are seen. 2. There is minimally progressive multilevel spondylosis with disc bulging and endplate osteophytes. These contribute to mild lateral  recess and foraminal narrowing as described above. No acute findings or definite nerve root compression. Right extraforaminal disc extrusion at L4-5 has involuted and there is less right foraminal narrowing.   Electronically Signed   By: Richardean Sale M.D.   On: 06/12/2017 14:41   He reports that he has quit smoking. He has never used smokeless tobacco. No results for input(s): HGBA1C, LABURIC in the last 8760 hours.  Objective:  VS:  HT:    WT:   BMI:     BP:133/81  HR:(!) 58bpm  TEMP: ( )  RESP:  Physical Exam  Constitutional: He is oriented  to person, place, and time. He appears well-developed and well-nourished. No distress.  HENT:  Head: Normocephalic and atraumatic.  Eyes: Pupils are equal, round, and reactive to light. Conjunctivae are normal.  Neck: Normal range of motion. Neck supple.  Cardiovascular: Regular rhythm and intact distal pulses.  Pulmonary/Chest: Effort normal. No respiratory distress.  Abdominal: Soft.  Musculoskeletal:  Patient goes from sit to stand with some difficulty particularly standing and extension.  He does have pain with facet joint loading more to the right than the left.  He clearly has thoracolumbar scoliosis with rightward curvature.  He has tight paraspinal musculature on the right of the curve in the upper lumbar region as well as tenderness to palpation and trigger point in the right quadratus lumborum area.  He also has what appears to be a lipoma on the left lower lumbar region.  This is not painful to palpation.  He has no pain over the greater trochanters and he has no pain with hip rotation.  Has good distal strength without clonus.  Neurological: He is alert and oriented to person, place, and time. He exhibits normal muscle tone. Coordination normal.  Skin: Skin is warm and dry. No rash noted. No erythema.  Psychiatric: He has a normal mood and affect.  Nursing note and vitals reviewed.   Ortho Exam Imaging: No results found.  Past Medical/Family/Surgical/Social History: Medications & Allergies reviewed per EMR, new medications updated. Patient Active Problem List   Diagnosis Date Noted  . Polycythemia vera ([place]) 04/13/2017  . OTHER AND UNSPECIFIED COAGULATION DEFECTS 01/02/2010  . ATRIAL FIBRILLATION 01/02/2010  . SNORING 02/27/2009  . ISCHEMIC COLITIS 03/27/2008  . ABDOMINAL PAIN-RLQ 03/22/2008  . ABNORMAL FINDINGS GI TRACT 03/22/2008  . PERSONAL HX COLONIC POLYPS 03/22/2008  . COLONIC POLYPS 03/21/2008  . HYPERLIPIDEMIA 03/21/2008  . GOUT 03/21/2008  . ANXIETY 03/21/2008    . MIGRAINE HEADACHE 03/21/2008  . CONSTIPATION, CHRONIC 03/21/2008  . ROSACEA 03/21/2008  . Essential hypertension 03/21/2008  . CARCINOMA, BASAL CELL, HX OF 03/21/2008  . TUBERCULOSIS, HX OF 03/21/2008  . ATRIAL FIBRILLATION, HX OF 03/21/2008  . BENIGN PROSTATIC HYPERTROPHY, HX OF 03/21/2008   Past Medical History:  Diagnosis Date  . Anxiety   . Atrial flutter ([place])    s/p afib and atrial flutter ablation 04/09/09  . BPH (benign prostatic hyperplasia)   . Cancer ([place])    basal cell CA removed  . Colitis, ischemic ([place])    secondary to ebolism from afib 1/10  . Constipation   . GERD (gastroesophageal reflux disease)   . Gout   . HTN (hypertension)   . Hyperlipidemia   . Intracranial bleed ([place]) 04/10/10   Right occipital hematoma with a left homonymous hemianopsia   . Migraines   . Paroxysmal atrial fibrillation ([place])    s/p PVI 04/09/09 and 10/17/10  . Rosacea   .  Stroke ([place]) 04/08/2010   hemorragic, anticoagulation stopped at that time  . Tuberculosis    s/p treatment 1964   Family History  Problem Relation Age of Onset  . Liver cancer Father   . Prostate cancer Father   . Coronary artery disease Father   . COPD Mother   . Breast cancer Sister   . Colon cancer Neg Hx   . Rectal cancer Neg Hx   . Stomach cancer Neg Hx   . Esophageal cancer Neg Hx    Past Surgical History:  Procedure Laterality Date  . ablation  04/09/2009   s/p afib and atrial flutter ablation by [facility]  . ABLATION OF DYSRHYTHMIC FOCUS  10/15/09   repeat afib ablation by [facility]  . APPENDECTOMY    . COLONOSCOPY    . CORNEAL LACERATION REPAIR    . INGUINAL HERNIA REPAIR    . POLYPECTOMY    . TONSILLECTOMY    . VASECTOMY     Social History   Occupational History  . Not on file  Tobacco Use  . Smoking status: Former Research scientist (life sciences)  . Smokeless tobacco: Never Used  Substance and Sexual Activity  . Alcohol use: No  . Drug use: No  . Sexual activity: Not on file

## 2017-07-23 NOTE — Telephone Encounter (Signed)
error 

## 2017-08-03 ENCOUNTER — Telehealth (INDEPENDENT_AMBULATORY_CARE_PROVIDER_SITE_OTHER): Payer: Self-pay | Admitting: Physical Medicine and Rehabilitation

## 2017-08-04 NOTE — Telephone Encounter (Signed)
Pt scheduled 08/23/17

## 2017-08-04 NOTE — Telephone Encounter (Signed)
Authorization [id number] Auth Effective Date:07/15/2017 Auth End Date:10/13/2017

## 2017-08-11 ENCOUNTER — Encounter: Payer: Self-pay | Admitting: Internal Medicine

## 2017-08-23 ENCOUNTER — Ambulatory Visit (INDEPENDENT_AMBULATORY_CARE_PROVIDER_SITE_OTHER): Payer: Self-pay

## 2017-08-23 ENCOUNTER — Encounter (INDEPENDENT_AMBULATORY_CARE_PROVIDER_SITE_OTHER): Payer: Self-pay | Admitting: Physical Medicine and Rehabilitation

## 2017-08-23 ENCOUNTER — Ambulatory Visit (INDEPENDENT_AMBULATORY_CARE_PROVIDER_SITE_OTHER): Payer: Medicare HMO | Admitting: Physical Medicine and Rehabilitation

## 2017-08-23 VITALS — BP 128/82 | HR 78 | Temp 98.1°F

## 2017-08-23 DIAGNOSIS — M545 Low back pain, unspecified: Secondary | ICD-10-CM

## 2017-08-23 DIAGNOSIS — M47816 Spondylosis without myelopathy or radiculopathy, lumbar region: Secondary | ICD-10-CM | POA: Diagnosis not present

## 2017-08-23 DIAGNOSIS — G8929 Other chronic pain: Secondary | ICD-10-CM

## 2017-08-23 MED ORDER — DEXAMETHASONE SODIUM PHOSPHATE 10 MG/ML IJ SOLN: 15.0000 mg | Freq: Once | INTRAMUSCULAR | Status: DC

## 2017-08-23 MED ORDER — BUPIVACAINE HCL 0.5 % IJ SOLN: 3.0000 mL | Freq: Once | INTRAMUSCULAR | Status: DC

## 2017-08-23 NOTE — Patient Instructions (Signed)

## 2017-08-23 NOTE — Progress Notes (Signed)
  Numeric Pain Rating Scale and Functional Assessment Average Pain 8   In the last MONTH (on 0-10 scale) has pain interfered with the following?  1. General activity like being  able to carry out your everyday physical activities such as walking, climbing stairs, carrying groceries, or moving a chair?  Rating(0)   +Driver, -BT, -Dye Allergies.

## 2017-08-26 NOTE — Progress Notes (Signed)
[facility] OFFICE PROGRESS NOTE  [name], MD [place] [address]  DIAGNOSIS: Polycythemiavera with positive JAK2 mutation.  PRIOR THERAPY: None.  CURRENT THERAPY: Phlebotomy on as-needed basis.  INTERVAL HISTORY: Dean Pratt 82 y.o. male returns for routine follow-up visit by himself.  The patient is feeling fine today and has no specific complaints.  He denies fevers and chills.  Denies chest pain, shortness of breath, cough, hemoptysis.  Denies nausea, vomiting, constipation, diarrhea.  Denies recent weight loss or night sweats.  The patient tolerated his last phlebotomy approximately 3 months ago well.  The patient is here for evaluation and repeat lab work.  MEDICAL HISTORY: Past Medical History:  Diagnosis Date  . Anxiety   . Atrial flutter ([place])    s/p afib and atrial flutter ablation 04/09/09  . BPH (benign prostatic hyperplasia)   . Cancer ([place])    basal cell CA removed  . Colitis, ischemic ([place])    secondary to ebolism from afib 1/10  . Constipation   . GERD (gastroesophageal reflux disease)   . Gout   . HTN (hypertension)   . Hyperlipidemia   . Intracranial bleed ([place]) 04/10/10   Right occipital hematoma with a left homonymous hemianopsia   . Migraines   . Paroxysmal atrial fibrillation ([place])    s/p PVI 04/09/09 and 10/17/10  . Rosacea   . Stroke ([place]) 04/08/2010   hemorragic, anticoagulation stopped at that time  . Tuberculosis    s/p treatment 1964    ALLERGIES:  is allergic to ace inhibitors and warfarin and related.  MEDICATIONS:  Current Outpatient Medications  Medication Sig Dispense Refill  . acetaminophen (TYLENOL) 325 MG tablet Take 650 mg by mouth every 6 (six) hours as needed.      Marland Kitchen allopurinol (ZYLOPRIM) 100 MG tablet Take 100 mg by mouth at bedtime.      . Ascorbic Acid (VITAMIN C) 1000 MG tablet Take 1,000 mg by mouth daily.     Marland Kitchen aspirin 81 MG tablet Take 81 mg by mouth daily.      . clobetasol  (TEMOVATE) 0.05 % external solution APPLY TO AFECTED AREAS (SCALP) TWICE A DAY AS NEEDED NOT FOR FACE,GROIN,UNDER ARMS  3  . diclofenac sodium (VOLTAREN) 1 % GEL APPLY 2-4 GRAMS 2-3 TIMES DAILY AS NEEDED FOR PAIN 100 g 0  . diltiazem (CARDIZEM CD) 240 MG 24 hr capsule Take 1 tablet by mouth daily.    Marland Kitchen diltiazem (TIAZAC) 240 MG 24 hr capsule Take by mouth daily.  3  . fluorometholone (FML) 0.1 % ophthalmic suspension SHAKE LQ AND INT 1 GTT IN OU QID  0  . fluticasone (CUTIVATE) 0.05 % cream     . ibuprofen (ADVIL,MOTRIN) 200 MG tablet Take 200 mg by mouth every 6 (six) hours as needed.    Marland Kitchen LORazepam (ATIVAN) 1 MG tablet TAKE 1/2-1 TABLET TWICE DAILY AS NEEDED  3  . losartan (COZAAR) 25 MG tablet Take 25 mg by mouth daily.      . methocarbamol (ROBAXIN) 500 MG tablet Take 1 tablet (500 mg total) by mouth every 8 (eight) hours as needed for muscle spasms. 60 tablet 0  . NON FORMULARY Stool softener 100 mg... Take 1 capsule by mouth once daily.      Current Facility-Administered Medications  Medication Dose Route Frequency Provider Last Rate Last Dose  . bupivacaine (MARCAINE) 0.5 % (with pres) injection 3 mL  3 mL Other Once Magnus Sinning, MD      .  dexamethasone (DECADRON) injection 15 mg  15 mg Other Once Magnus Sinning, MD        SURGICAL HISTORY:  Past Surgical History:  Procedure Laterality Date  . ablation  04/09/2009   s/p afib and atrial flutter ablation by JA  . ABLATION OF DYSRHYTHMIC FOCUS  10/15/09   repeat afib ablation by JA  . APPENDECTOMY    . COLONOSCOPY    . CORNEAL LACERATION REPAIR    . INGUINAL HERNIA REPAIR    . POLYPECTOMY    . TONSILLECTOMY    . VASECTOMY      REVIEW OF SYSTEMS:   Review of Systems  Constitutional: Negative for appetite change, chills, fatigue, fever and unexpected weight change.  HENT:   Negative for mouth sores, nosebleeds, sore throat and trouble swallowing.   Eyes: Negative for eye problems and icterus.  Respiratory: Negative for  cough, hemoptysis, shortness of breath and wheezing.   Cardiovascular: Negative for chest pain and leg swelling.  Gastrointestinal: Negative for abdominal pain, constipation, diarrhea, nausea and vomiting.  Genitourinary: Negative for bladder incontinence, difficulty urinating, dysuria, frequency and hematuria.   Musculoskeletal: Negative for back pain, gait problem, neck pain and neck stiffness.  Skin: Negative for itching and rash.  Neurological: Negative for dizziness, extremity weakness, gait problem, headaches, light-headedness and seizures.  Hematological: Negative for adenopathy. Does not bruise/bleed easily.  Psychiatric/Behavioral: Negative for confusion, depression and sleep disturbance. The patient is not nervous/anxious.     PHYSICAL EXAMINATION:  Blood pressure 124/85, pulse 63, temperature 97.7 F (36.5 C), temperature source Oral, resp. rate 18, height 5\' 10"  (1.778 m), weight 174 lb 12.8 oz (79.3 kg), SpO2 97 %.  ECOG PERFORMANCE STATUS: 1 - Symptomatic but completely ambulatory  Physical Exam  Constitutional: Oriented to person, place, and time and well-developed, well-nourished, and in no distress. No distress.  HENT:  Head: Normocephalic and atraumatic.  Mouth/Throat: Oropharynx is clear and moist. No oropharyngeal exudate.  Eyes: Conjunctivae are normal. Right eye exhibits no discharge. Left eye exhibits no discharge. No scleral icterus.  Neck: Normal range of motion. Neck supple.  Cardiovascular: Normal rate, regular rhythm, normal heart sounds and intact distal pulses.   Pulmonary/Chest: Effort normal and breath sounds normal. No respiratory distress. No wheezes. No rales.  Abdominal: Soft. Bowel sounds are normal. Exhibits no distension and no mass. There is no tenderness.  Musculoskeletal: Normal range of motion. Exhibits no edema.  Lymphadenopathy:    No cervical adenopathy.  Neurological: Alert and oriented to person, place, and time. Exhibits normal muscle  tone. Gait normal. Coordination normal.  Skin: Skin is warm and dry. No rash noted. Not diaphoretic. No erythema. No pallor.  Psychiatric: Mood, memory and judgment normal.  Vitals reviewed.  LABORATORY DATA: Lab Results  Component Value Date   WBC 9.5 08/27/2017   HGB 16.7 08/27/2017   HCT 49.2 08/27/2017   MCV 83.5 08/27/2017   PLT 368 08/27/2017      Chemistry      Component Value Date/Time   NA 141 08/27/2017 0741   K 4.0 08/27/2017 0741   CL 110 08/27/2017 0741   CO2 21 (L) 08/27/2017 0741   BUN 12 08/27/2017 0741   CREATININE 1.01 08/27/2017 0741      Component Value Date/Time   CALCIUM 9.8 08/27/2017 0741   ALKPHOS 64 08/27/2017 0741   AST 22 08/27/2017 0741   ALT 27 08/27/2017 0741   BILITOT 0.6 08/27/2017 0741       RADIOGRAPHIC STUDIES:  Xr  C-arm No Report  Result Date: 08/23/2017 Please see Notes tab for imaging impression.    ASSESSMENT/PLAN:  Polycythemia vera ([place]) This is a very pleasant 82 year old white male with recently diagnosed polycythemia vera with positive JAK-2 mutation.  The patient receives phlebotomy on as-needed basis to keep his hematocrit around 45%. He is here for repeat lab work and possible phlebotomy.  The patient was seen with Dr. Earlie Server.  Labs reviewed. Hematocrit is 49.2%.  He will proceed with phlebotomy as scheduled today.   The patient will follow-up in 3 months for evaluation and repeat lab work.  We will schedule possible phlebotomy on the day of his next visit.  He was advised to call if he has any concerning symptoms in the interval. The patient voices understanding of current disease status and treatment options and is in agreement with the current care plan.  All questions were answered. The patient knows to call the clinic with any problems, questions or concerns. We can certainly see the patient much sooner if necessary.   Orders Placed This Encounter  Procedures  . CBC with Differential (Cancer  Center Only)    Standing Status:   Future    Standing Expiration Date:   08/28/2018  . CMP ([facility] only)    Standing Status:   Future    Standing Expiration Date:   08/28/2018  . Lactate dehydrogenase    Standing Status:   Future    Standing Expiration Date:   08/28/2018     Mikey Bussing, DNP, AGPCNP-BC, AOCNP 08/27/17   ADDENDUM: Hematology/Oncology Attending: I had a face-to-face encounter with the patient.  I recommended his care plan.  This is a very pleasant 82 years old white male with polycythemia vera and a positive Jak 2 mutation.  The patient has been on phlebotomy on as-needed basis to keep his hematocrit around 45%.  His CBC today showed hematocrit of 49.2%.  I recommended for the patient to proceed with phlebotomy today as a scheduled. I will see him back for follow-up visit in 3 months for evaluation with repeat CBC and phlebotomy if needed. The patient was advised to call immediately if he has any concerning symptoms in the interval.  Disclaimer: This note was dictated with voice recognition software. Similar sounding words can inadvertently be transcribed and may be missed upon review. Eilleen Kempf, MD  08/28/17

## 2017-08-26 NOTE — Assessment & Plan Note (Addendum)
This is a very pleasant 82 year old white male with recently diagnosed polycythemia vera with positive JAK-2 mutation.  The patient receives phlebotomy on as-needed basis to keep his hematocrit around 45%. He is here for repeat lab work and possible phlebotomy.  The patient was seen with Dr. Earlie Server.  Labs reviewed. Hematocrit is 49.2%.  He will proceed with phlebotomy as scheduled today.   The patient will follow-up in 3 months for evaluation and repeat lab work.  We will schedule possible phlebotomy on the day of his next visit.  He was advised to call if he has any concerning symptoms in the interval. The patient voices understanding of current disease status and treatment options and is in agreement with the current care plan.  All questions were answered. The patient knows to call the clinic with any problems, questions or concerns. We can certainly see the patient much sooner if necessary.

## 2017-08-27 ENCOUNTER — Encounter: Payer: Self-pay | Admitting: Oncology

## 2017-08-27 ENCOUNTER — Inpatient Hospital Stay: Payer: Medicare HMO

## 2017-08-27 ENCOUNTER — Inpatient Hospital Stay (HOSPITAL_BASED_OUTPATIENT_CLINIC_OR_DEPARTMENT_OTHER): Payer: Medicare HMO | Admitting: Oncology

## 2017-08-27 ENCOUNTER — Inpatient Hospital Stay: Payer: Medicare HMO | Attending: Internal Medicine

## 2017-08-27 ENCOUNTER — Telehealth: Payer: Self-pay | Admitting: Oncology

## 2017-08-27 VITALS — BP 129/92 | HR 67 | Temp 97.7°F | Resp 18

## 2017-08-27 VITALS — BP 124/85 | HR 63 | Temp 97.7°F | Resp 18 | Ht 70.0 in | Wt 174.8 lb

## 2017-08-27 DIAGNOSIS — F418 Other specified anxiety disorders: Secondary | ICD-10-CM

## 2017-08-27 DIAGNOSIS — N400 Enlarged prostate without lower urinary tract symptoms: Secondary | ICD-10-CM | POA: Diagnosis not present

## 2017-08-27 DIAGNOSIS — Z85828 Personal history of other malignant neoplasm of skin: Secondary | ICD-10-CM

## 2017-08-27 DIAGNOSIS — I480 Paroxysmal atrial fibrillation: Secondary | ICD-10-CM

## 2017-08-27 DIAGNOSIS — I10 Essential (primary) hypertension: Secondary | ICD-10-CM | POA: Insufficient documentation

## 2017-08-27 DIAGNOSIS — Z8673 Personal history of transient ischemic attack (TIA), and cerebral infarction without residual deficits: Secondary | ICD-10-CM | POA: Diagnosis not present

## 2017-08-27 DIAGNOSIS — Z7982 Long term (current) use of aspirin: Secondary | ICD-10-CM | POA: Insufficient documentation

## 2017-08-27 DIAGNOSIS — D45 Polycythemia vera: Secondary | ICD-10-CM | POA: Diagnosis not present

## 2017-08-27 DIAGNOSIS — Z79899 Other long term (current) drug therapy: Secondary | ICD-10-CM

## 2017-08-27 DIAGNOSIS — E785 Hyperlipidemia, unspecified: Secondary | ICD-10-CM

## 2017-08-27 DIAGNOSIS — K219 Gastro-esophageal reflux disease without esophagitis: Secondary | ICD-10-CM | POA: Diagnosis not present

## 2017-08-27 DIAGNOSIS — R69 Illness, unspecified: Secondary | ICD-10-CM | POA: Diagnosis not present

## 2017-08-27 LAB — CBC WITH DIFFERENTIAL (CANCER CENTER ONLY)
Basophils Absolute: 0.1 10*3/uL (ref 0.0–0.1)
Basophils Relative: 1 %
Eosinophils Absolute: 0.2 10*3/uL (ref 0.0–0.5)
Eosinophils Relative: 2 %
HEMATOCRIT: 49.2 % (ref 38.4–49.9)
HEMOGLOBIN: 16.7 g/dL (ref 13.0–17.1)
LYMPHS ABS: 1.2 10*3/uL (ref 0.9–3.3)
Lymphocytes Relative: 12 %
MCH: 28.3 pg (ref 27.2–33.4)
MCHC: 34 g/dL (ref 32.0–36.0)
MCV: 83.5 fL (ref 79.3–98.0)
Monocytes Absolute: 0.7 10*3/uL (ref 0.1–0.9)
Monocytes Relative: 8 %
NEUTROS ABS: 7.3 10*3/uL — AB (ref 1.5–6.5)
NEUTROS PCT: 77 %
Platelet Count: 368 10*3/uL (ref 140–400)
RBC: 5.9 MIL/uL — AB (ref 4.20–5.82)
RDW: 14.6 % (ref 11.0–14.6)
WBC: 9.5 10*3/uL (ref 4.0–10.3)

## 2017-08-27 LAB — CMP (CANCER CENTER ONLY)
ALBUMIN: 4.2 g/dL (ref 3.5–5.0)
ALK PHOS: 64 U/L (ref 38–126)
ALT: 27 U/L (ref 0–44)
AST: 22 U/L (ref 15–41)
Anion gap: 10 (ref 5–15)
BILIRUBIN TOTAL: 0.6 mg/dL (ref 0.3–1.2)
BUN: 12 mg/dL (ref 8–23)
CALCIUM: 9.8 mg/dL (ref 8.9–10.3)
CO2: 21 mmol/L — AB (ref 22–32)
CREATININE: 1.01 mg/dL (ref 0.61–1.24)
Chloride: 110 mmol/L (ref 98–111)
GFR, Estimated: >60 mL/min (ref >60)
GLUCOSE: 102 mg/dL — AB (ref 70–99)
Potassium: 4 mmol/L (ref 3.5–5.1)
SODIUM: 141 mmol/L (ref 135–145)
Total Protein: 6.8 g/dL (ref 6.5–8.1)

## 2017-08-27 LAB — LACTATE DEHYDROGENASE: LDH: 141 U/L (ref 98–192)

## 2017-08-27 NOTE — Telephone Encounter (Signed)
Scheduled appt per 8/23 los - gave patient aVS and calender per los.  

## 2017-08-27 NOTE — Patient Instructions (Signed)

## 2017-08-27 NOTE — Progress Notes (Signed)
Pt seen for therapeutic phlebotomy. 500cc removed without difficulty. Pt provided with food and beverage.  Pt observed for 30 minutes post procedure with no complaints. VSS upon leaving.

## 2017-08-30 ENCOUNTER — Encounter: Payer: Self-pay | Admitting: Internal Medicine

## 2017-08-31 NOTE — Procedures (Signed)
Lumbar Diagnostic Facet Joint Nerve Block with Fluoroscopic Guidance   Patient: Dean Pratt      Date of Birth: January 25, 1935 MRN: [id number] PCP: [name], MD      Visit Date: 08/23/2017   Universal Protocol:    Date/Time: 08/27/195:57 AM  Consent Given By: the patient  Position: PRONE  Additional Comments: Vital signs were monitored before and after the procedure. Patient was prepped and draped in the usual sterile fashion. The correct patient, procedure, and site was verified.   Injection Procedure Details:  Procedure Site One Meds Administered:  Meds ordered this encounter  Medications  . dexamethasone (DECADRON) injection 15 mg  . bupivacaine (MARCAINE) 0.5 % (with pres) injection 3 mL     Laterality: Bilateral  Location/Site: Bilateral L1, L2 and L3 medial branches L2-L3 L3-L4  Needle size: 22 ga.  Needle type:spinal  Needle Placement: Oblique pedical  Findings:   -Comments: There was excellent flow of contrast along the articular pillars without intravascular flow.  Procedure Details: The fluoroscope beam is vertically oriented in AP and then obliqued 15 to 20 degrees to the ipsilateral side of the desired nerve to achieve the "Scotty dog" appearance.  The skin over the target area of the junction of the superior articulating process and the transverse process (sacral ala if blocking the L5 dorsal rami) was locally anesthetized with a 1 ml volume of 1% Lidocaine without Epinephrine.  The spinal needle was inserted and advanced in a trajectory view down to the target.   After contact with periosteum and negative aspirate for blood and CSF, correct placement without intravascular or epidural spread was confirmed by injecting 0.5 ml. of Isovue-250.  A spot radiograph was obtained of this image.    Next, a 0.5 ml. volume of the injectate described above was injected. The needle was then redirected to the other facet joint nerves mentioned above if  needed.  Prior to the procedure, the patient was given a Pain Diary which was completed for baseline measurements.  After the procedure, the patient rated their pain every 30 minutes and will continue rating at this frequency for a total of 5 hours.  The patient has been asked to complete the Diary and return to [place] by mail, fax or hand delivered as soon as possible.   Additional Comments:  The patient tolerated the procedure well Dressing: Band-Aid    Post-procedure details: Patient was observed during the procedure. Post-procedure instructions were reviewed.  Patient left the clinic in stable condition.

## 2017-08-31 NOTE — Progress Notes (Signed)
Dean Pratt - 82 y.o. male MRN [id number]  Date of birth: [birth year]  Office Visit Note: Visit Date: 08/23/2017 PCP: [name], MD Referred by: [name], MD  Subjective: Chief Complaint  Patient presents with  . Lower Back - Pain   HPI: [name] is an 82 year old gentleman who comes in today for planned bilateral medial branch blocks of the L2-3 and L3-4 facet joints.  Please see our prior evaluation and management note for further details and justification.  Pain diary was given.   ROS Otherwise per HPI.  Assessment & Plan: Visit Diagnoses:  1. Spondylosis without myelopathy or radiculopathy, lumbar region   2. Chronic bilateral low back pain without sciatica     Plan: No additional findings.   Meds & Orders:  Meds ordered this encounter  Medications  . dexamethasone (DECADRON) injection 15 mg  . bupivacaine (MARCAINE) 0.5 % (with pres) injection 3 mL    Orders Placed This Encounter  Procedures  . Facet Injection  . XR C-ARM NO REPORT    Follow-up: Return if symptoms worsen or fail to improve.   Procedures: No procedures performed  Lumbar Diagnostic Facet Joint Nerve Block with Fluoroscopic Guidance   Patient: CHRISTAIN Pratt      Date of Birth: Feb 03, 1935 MRN: [id number] PCP: [name], MD      Visit Date: 08/23/2017   Universal Protocol:    Date/Time: 08/27/195:57 AM  Consent Given By: the patient  Position: PRONE  Additional Comments: Vital signs were monitored before and after the procedure. Patient was prepped and draped in the usual sterile fashion. The correct patient, procedure, and site was verified.   Injection Procedure Details:  Procedure Site One Meds Administered:  Meds ordered this encounter  Medications  . dexamethasone (DECADRON) injection 15 mg  . bupivacaine (MARCAINE) 0.5 % (with pres) injection 3 mL     Laterality: Bilateral  Location/Site: Bilateral L1, L2 and L3 medial branches L2-L3 L3-L4  Needle size: 22  ga.  Needle type:spinal  Needle Placement: Oblique pedical  Findings:   -Comments: There was excellent flow of contrast along the articular pillars without intravascular flow.  Procedure Details: The fluoroscope beam is vertically oriented in AP and then obliqued 15 to 20 degrees to the ipsilateral side of the desired nerve to achieve the "Scotty dog" appearance.  The skin over the target area of the junction of the superior articulating process and the transverse process (sacral ala if blocking the L5 dorsal rami) was locally anesthetized with a 1 ml volume of 1% Lidocaine without Epinephrine.  The spinal needle was inserted and advanced in a trajectory view down to the target.   After contact with periosteum and negative aspirate for blood and CSF, correct placement without intravascular or epidural spread was confirmed by injecting 0.5 ml. of Isovue-250.  A spot radiograph was obtained of this image.    Next, a 0.5 ml. volume of the injectate described above was injected. The needle was then redirected to the other facet joint nerves mentioned above if needed.  Prior to the procedure, the patient was given a Pain Diary which was completed for baseline measurements.  After the procedure, the patient rated their pain every 30 minutes and will continue rating at this frequency for a total of 5 hours.  The patient has been asked to complete the Diary and return to [place] by mail, fax or hand delivered as soon as possible.   Additional Comments:  The patient tolerated the  procedure well Dressing: Band-Aid    Post-procedure details: Patient was observed during the procedure. Post-procedure instructions were reviewed.  Patient left the clinic in stable condition.   Clinical History: MRI LUMBAR SPINE WITHOUT CONTRAST  TECHNIQUE: Multiplanar, multisequence MR imaging of the lumbar spine was performed. No intravenous contrast was administered.  COMPARISON:  Lumbar MRI 10/16/2010.   Abdominal CT 02/28/2008.  FINDINGS: Segmentation: Conventional anatomy assumed, with the last open disc space designated L5-S1.This numbering is concordant with previous imaging.  Alignment: There is a convex right scoliosis centered at L2. The lateral alignment is stable and near anatomic.  Vertebrae: No worrisome osseous lesion, acute fracture or pars defect. There are scattered endplate degenerative changes. The lumbar pedicles are somewhat short on a congenital basis. The visualized sacroiliac joints appear unremarkable.  Conus medullaris: Extends to the T12-L1 level and appears normal.  Paraspinal and other soft tissues: Right renal cysts are partially imaged.  Disc levels:  T12-L1: Chronic disc extrusion with cephalad extension has largely involuted. No mass effect on the distal cord or foraminal compromise.  L1-2: Chronic degenerative disc disease with loss of disc height, annular disc bulging eccentric to the left and circumferential endplate osteophytes. Mild facet hypertrophy. No significant spinal stenosis or nerve root encroachment.  L2-3: Mildly progressive loss of disc height with annular disc bulging and endplate osteophytes asymmetric to the left. Mild facet and ligamentous hypertrophy. There is mild asymmetric narrowing of the left lateral recess and left foramen which has mildly progressed.  L3-4: Stable mild loss of disc height with annular disc bulging and asymmetric facet hypertrophy on the right. Mild narrowing of the right lateral recess and both foramina.  L4-5: Mildly progressive loss of disc height annular disc bulging and endplate osteophytes asymmetric to the right. Right extraforaminal disc extrusion has involuted with improved right foraminal narrowing, now moderate. There is facet and ligamentous hypertrophy contributing to mild narrowing of the lateral recesses.  L5-S1: Stable chronic degenerative disc disease with loss of  disc height and annular disc bulging eccentric the right. Mild facet hypertrophy. Stable mild right-greater-than-left foraminal narrowing.  IMPRESSION: 1. Compared with the prior study from 2012, no acute findings are seen. 2. There is minimally progressive multilevel spondylosis with disc bulging and endplate osteophytes. These contribute to mild lateral recess and foraminal narrowing as described above. No acute findings or definite nerve root compression. Right extraforaminal disc extrusion at L4-5 has involuted and there is less right foraminal narrowing.   Electronically Signed   By: Richardean Sale M.D.   On: 06/12/2017 14:41   He reports that he has quit smoking. He has never used smokeless tobacco. No results for input(s): HGBA1C, LABURIC in the last 8760 hours.  Objective:  VS:  HT:    WT:   BMI:     BP:128/82  HR:78bpm  TEMP:98.1 F (36.7 C)(Oral)  RESP:96 % Physical Exam  Ortho Exam Imaging: No results found.  Past Medical/Family/Surgical/Social History: Medications & Allergies reviewed per EMR, new medications updated. Patient Active Problem List   Diagnosis Date Noted  . Polycythemia vera ([place]) 04/13/2017  . OTHER AND UNSPECIFIED COAGULATION DEFECTS 01/02/2010  . ATRIAL FIBRILLATION 01/02/2010  . SNORING 02/27/2009  . ISCHEMIC COLITIS 03/27/2008  . ABDOMINAL PAIN-RLQ 03/22/2008  . ABNORMAL FINDINGS GI TRACT 03/22/2008  . PERSONAL HX COLONIC POLYPS 03/22/2008  . COLONIC POLYPS 03/21/2008  . HYPERLIPIDEMIA 03/21/2008  . GOUT 03/21/2008  . ANXIETY 03/21/2008  . MIGRAINE HEADACHE 03/21/2008  . CONSTIPATION, CHRONIC 03/21/2008  . ROSACEA  03/21/2008  . Essential hypertension 03/21/2008  . CARCINOMA, BASAL CELL, HX OF 03/21/2008  . TUBERCULOSIS, HX OF 03/21/2008  . ATRIAL FIBRILLATION, HX OF 03/21/2008  . BENIGN PROSTATIC HYPERTROPHY, HX OF 03/21/2008   Past Medical History:  Diagnosis Date  . Anxiety   . Atrial flutter ([place])    s/p afib and  atrial flutter ablation 04/09/09  . BPH (benign prostatic hyperplasia)   . Cancer ([place])    basal cell CA removed  . Colitis, ischemic ([place])    secondary to ebolism from afib 1/10  . Constipation   . GERD (gastroesophageal reflux disease)   . Gout   . HTN (hypertension)   . Hyperlipidemia   . Intracranial bleed ([place]) 04/10/10   Right occipital hematoma with a left homonymous hemianopsia   . Migraines   . Paroxysmal atrial fibrillation ([place])    s/p PVI 04/09/09 and 10/17/10  . Rosacea   . Stroke ([place]) 04/08/2010   hemorragic, anticoagulation stopped at that time  . Tuberculosis    s/p treatment 1964   Family History  Problem Relation Age of Onset  . Liver cancer Father   . Prostate cancer Father   . Coronary artery disease Father   . COPD Mother   . Breast cancer Sister   . Colon cancer Neg Hx   . Rectal cancer Neg Hx   . Stomach cancer Neg Hx   . Esophageal cancer Neg Hx    Past Surgical History:  Procedure Laterality Date  . ablation  04/09/2009   s/p afib and atrial flutter ablation by [facility]  . ABLATION OF DYSRHYTHMIC FOCUS  10/15/09   repeat afib ablation by JA  . APPENDECTOMY    . COLONOSCOPY    . CORNEAL LACERATION REPAIR    . INGUINAL HERNIA REPAIR    . POLYPECTOMY    . TONSILLECTOMY    . VASECTOMY     Social History   Occupational History  . Not on file  Tobacco Use  . Smoking status: Former Research scientist (life sciences)  . Smokeless tobacco: Never Used  Substance and Sexual Activity  . Alcohol use: No  . Drug use: No  . Sexual activity: Not on file

## 2017-09-15 DIAGNOSIS — E038 Other specified hypothyroidism: Secondary | ICD-10-CM | POA: Diagnosis not present

## 2017-09-15 DIAGNOSIS — I129 Hypertensive chronic kidney disease with stage 1 through stage 4 chronic kidney disease, or unspecified chronic kidney disease: Secondary | ICD-10-CM | POA: Diagnosis not present

## 2017-09-15 DIAGNOSIS — N5082 Scrotal pain: Secondary | ICD-10-CM | POA: Diagnosis not present

## 2017-09-15 DIAGNOSIS — E298 Other testicular dysfunction: Secondary | ICD-10-CM | POA: Diagnosis not present

## 2017-09-15 DIAGNOSIS — I480 Paroxysmal atrial fibrillation: Secondary | ICD-10-CM | POA: Diagnosis not present

## 2017-09-15 DIAGNOSIS — N182 Chronic kidney disease, stage 2 (mild): Secondary | ICD-10-CM | POA: Diagnosis not present

## 2017-09-15 DIAGNOSIS — M109 Gout, unspecified: Secondary | ICD-10-CM | POA: Diagnosis not present

## 2017-09-15 DIAGNOSIS — D692 Other nonthrombocytopenic purpura: Secondary | ICD-10-CM | POA: Diagnosis not present

## 2017-09-15 DIAGNOSIS — Z23 Encounter for immunization: Secondary | ICD-10-CM | POA: Diagnosis not present

## 2017-09-15 DIAGNOSIS — R05 Cough: Secondary | ICD-10-CM | POA: Diagnosis not present

## 2017-09-17 ENCOUNTER — Other Ambulatory Visit: Payer: Self-pay | Admitting: Internal Medicine

## 2017-09-17 DIAGNOSIS — N5082 Scrotal pain: Secondary | ICD-10-CM

## 2017-09-21 ENCOUNTER — Ambulatory Visit
Admission: RE | Admit: 2017-09-21 | Discharge: 2017-09-21 | Disposition: A | Discharge status: Home / Self Care | Payer: Medicare HMO | Source: Ambulatory Visit | Attending: Internal Medicine | Admitting: Internal Medicine

## 2017-09-21 DIAGNOSIS — N5082 Scrotal pain: Secondary | ICD-10-CM

## 2017-10-12 ENCOUNTER — Encounter: Payer: Self-pay | Admitting: Internal Medicine

## 2017-10-12 ENCOUNTER — Ambulatory Visit (AMBULATORY_SURGERY_CENTER): Payer: Self-pay | Admitting: *Deleted

## 2017-10-12 VITALS — Ht 70.0 in | Wt 175.2 lb

## 2017-10-12 DIAGNOSIS — Z86010 Personal history of colonic polyps: Secondary | ICD-10-CM

## 2017-10-12 MED ORDER — NA SULFATE-K SULFATE-MG SULF 17.5-3.13-1.6 GM/177ML PO SOLN: 1.0000 | Freq: Once | ORAL | 0 refills | Status: AC

## 2017-10-12 NOTE — Progress Notes (Signed)
No egg or soy allergy known to patient  No issues with past sedation with any surgeries  or procedures, no intubation problems  No diet pills per patient No home 02 use per patient  No blood thinners per patient  Pt denies issues with constipation  -- currently uses stool softener daily- pt states he has soft regular stools mostly  No A fib or A flutter - hx of Ablation- no blood thinners  EMMI video sent to pt's e mail  -- pt has no e mail

## 2017-10-26 ENCOUNTER — Ambulatory Visit (AMBULATORY_SURGERY_CENTER): Payer: Medicare HMO | Admitting: Internal Medicine

## 2017-10-26 ENCOUNTER — Encounter: Payer: Self-pay | Admitting: Internal Medicine

## 2017-10-26 VITALS — BP 129/90 | HR 81 | Temp 97.3°F | Resp 15 | Ht 70.0 in | Wt 175.0 lb

## 2017-10-26 DIAGNOSIS — D124 Benign neoplasm of descending colon: Secondary | ICD-10-CM

## 2017-10-26 DIAGNOSIS — Z86010 Personal history of colonic polyps: Secondary | ICD-10-CM | POA: Diagnosis not present

## 2017-10-26 DIAGNOSIS — D125 Benign neoplasm of sigmoid colon: Secondary | ICD-10-CM | POA: Diagnosis not present

## 2017-10-26 DIAGNOSIS — D122 Benign neoplasm of ascending colon: Secondary | ICD-10-CM | POA: Diagnosis not present

## 2017-10-26 DIAGNOSIS — K635 Polyp of colon: Secondary | ICD-10-CM

## 2017-10-26 DIAGNOSIS — D123 Benign neoplasm of transverse colon: Secondary | ICD-10-CM

## 2017-10-26 MED ORDER — SODIUM CHLORIDE 0.9 % IV SOLN: 500.0000 mL | Freq: Once | INTRAVENOUS | Status: DC

## 2017-10-26 NOTE — Patient Instructions (Signed)
YOU HAD AN ENDOSCOPIC PROCEDURE TODAY AT [facility]:   Refer to the procedure report that was given to you for any specific questions about what was found during the examination.  If the procedure report does not answer your questions, please call your gastroenterologist to clarify.  If you requested that your care partner not be given the details of your procedure findings, then the procedure report has been included in a sealed envelope for you to review at your convenience later.  YOU SHOULD EXPECT: Some feelings of bloating in the abdomen. Passage of more gas than usual.  Walking can help get rid of the air that was put into your GI tract during the procedure and reduce the bloating. If you had a lower endoscopy (such as a colonoscopy or flexible sigmoidoscopy) you may notice spotting of blood in your stool or on the toilet paper. If you underwent a bowel prep for your procedure, you may not have a normal bowel movement for a few days.  Please Note:  You might notice some irritation and congestion in your nose or some drainage.  This is from the oxygen used during your procedure.  There is no need for concern and it should clear up in a day or so.  SYMPTOMS TO REPORT IMMEDIATELY:   Following lower endoscopy (colonoscopy or flexible sigmoidoscopy):  Excessive amounts of blood in the stool  Significant tenderness or worsening of abdominal pains  Swelling of the abdomen that is new, acute  Fever of 100F or higher   For urgent or emergent issues, a gastroenterologist can be reached at any hour by calling [phone].   DIET:  We do recommend a small meal at first, but then you may proceed to your regular diet.  Drink plenty of fluids but you should avoid alcoholic beverages for 24 hours.  MEDICATIONS: Continue present medications.  Please see handouts given to you by your recovery nurse.  ACTIVITY:  You should plan to take it easy for the rest of today and you should  NOT DRIVE or use heavy machinery until tomorrow (because of the sedation medicines used during the test).    FOLLOW UP: Our staff will call the number listed on your records the next business day following your procedure to check on you and address any questions or concerns that you may have regarding the information given to you following your procedure. If we do not reach you, we will leave a message.  However, if you are feeling well and you are not experiencing any problems, there is no need to return our call.  We will assume that you have returned to your regular daily activities without incident.  If any biopsies were taken you will be contacted by phone or by letter within the next 1-3 weeks.  Please call us at [phone] if you have not heard about the biopsies in 3 weeks.    SIGNATURES/CONFIDENTIALITY: You and/or your care partner have signed paperwork which will be entered into your electronic medical record.  These signatures attest to the fact that that the information above on your After Visit Summary has been reviewed and is understood.  Full responsibility of the confidentiality of this discharge information lies with you and/or your care-partner.

## 2017-10-26 NOTE — Op Note (Signed)
[facility] Patient Name: Dean Pratt Procedure Date: 10/26/2017 9:56 AM MRN: [id number] Endoscopist: Docia Chuck. Henrene Pastor , MD Age: 82 Referring MD:  Date of Birth: Jun 02, 1935 Gender: Male Account #: [id number] Procedure:                Colonoscopy with cold snare polypectomy x 17 Indications:              High risk colon cancer surveillance: Personal                            history of adenoma (10 mm or greater in size), High                            risk colon cancer surveillance: Personal history of                            multiple (3 or more) adenomas. Acquired polyposis                            syndrome. Previous examinations 2010 (greater than                            20 polyps); 2012 (2); 2013 (2); 2016 (9) Medicines:                Monitored Anesthesia Care Procedure:                Pre-Anesthesia Assessment:                           - Prior to the procedure, a History and Physical                            was performed, and patient medications and                            allergies were reviewed. The patient's tolerance of                            previous anesthesia was also reviewed. The risks                            and benefits of the procedure and the sedation                            options and risks were discussed with the patient.                            All questions were answered, and informed consent                            was obtained. Prior Anticoagulants: The patient has                            taken no previous anticoagulant or antiplatelet  agents. ASA Grade Assessment: II - A patient with                            mild systemic disease. After reviewing the risks                            and benefits, the patient was deemed in                            satisfactory condition to undergo the procedure.                           After obtaining informed consent, the colonoscope            was passed under direct vision. Throughout the                            procedure, the patient's blood pressure, pulse, and                            oxygen saturations were monitored continuously. The                            Colonoscope was introduced through the anus and                            advanced to the the cecum, identified by                            appendiceal orifice and ileocecal valve. The                            ileocecal valve, appendiceal orifice, and rectum                            were photographed. The quality of the bowel                            preparation was good. The colonoscopy was performed                            without difficulty. The patient tolerated the                            procedure well. The bowel preparation used was                            SUPREP. Scope In: 10:07:51 AM Scope Out: 10:34:48 AM Scope Withdrawal Time: 0 hours 24 minutes 13 seconds  Total Procedure Duration: 0 hours 26 minutes 57 seconds  Findings:                 Multiple polyps were found in the sigmoid colon,  descending colon, transverse colon and ascending                            colon. The polyps were 1 to 7 mm in size. These                            polyps were removed with a cold snare. Resection                            and retrieval were complete.                           Internal hemorrhoids were found during retroflexion.                           The exam was otherwise without abnormality on                            direct and retroflexion views. Complications:            No immediate complications. Estimated blood loss:                            None. Estimated Blood Loss:     Estimated blood loss: none. Impression:               - Multiple 1 to 7 mm polyps in the sigmoid colon,                            in the descending colon, in the transverse colon                            and in the  ascending colon, removed with a cold                            snare. Resected and retrieved.                           - Internal hemorrhoids.                           - The examination was otherwise normal on direct                            and retroflexion views. Recommendation:           - Repeat colonoscopy in 2 years for surveillance.                           - Patient has a contact number available for                            emergencies. The signs and symptoms of potential                            delayed  complications were discussed with the                            patient. Return to normal activities tomorrow.                            Written discharge instructions were provided to the                            patient.                           - Resume previous diet.                           - Continue present medications.                           - Await pathology results. Docia Chuck. Henrene Pastor, MD 10/26/2017 10:40:48 AM This report has been signed electronically.

## 2017-10-26 NOTE — Progress Notes (Signed)
Called to room to assist during endoscopic procedure.  Patient ID and intended procedure confirmed with present staff. Received instructions for my participation in the procedure from the performing physician.  

## 2017-10-26 NOTE — Progress Notes (Signed)
PT taken to PACU. Monitors applied. Report given to RN.

## 2017-10-27 ENCOUNTER — Telehealth: Payer: Self-pay | Admitting: *Deleted

## 2017-10-27 NOTE — Telephone Encounter (Signed)
  Follow up Call-  Call back number 10/26/2017  Post procedure Call Back phone  # [phone]  Permission to leave phone message Yes  Some recent data might be hidden     Patient questions:  Do you have a fever, pain , or abdominal swelling? No. Pain Score  0 *  Have you tolerated food without any problems? Yes.    Have you been able to return to your normal activities? yes  Do you have any questions about your discharge instructions: Diet   No. Medications  No. Follow up visit  No.  Do you have questions or concerns about your Care? No.  Actions: * If pain score is 4 or above: No action needed, pain <4.

## 2017-11-02 ENCOUNTER — Encounter: Payer: Self-pay | Admitting: Internal Medicine

## 2017-11-12 DIAGNOSIS — H10413 Chronic giant papillary conjunctivitis, bilateral: Secondary | ICD-10-CM | POA: Diagnosis not present

## 2017-11-12 DIAGNOSIS — H10823 Rosacea conjunctivitis, bilateral: Secondary | ICD-10-CM | POA: Diagnosis not present

## 2017-11-12 DIAGNOSIS — H16223 Keratoconjunctivitis sicca, not specified as Sjogren's, bilateral: Secondary | ICD-10-CM | POA: Diagnosis not present

## 2017-11-12 DIAGNOSIS — Z961 Presence of intraocular lens: Secondary | ICD-10-CM | POA: Diagnosis not present

## 2017-11-25 ENCOUNTER — Inpatient Hospital Stay (HOSPITAL_BASED_OUTPATIENT_CLINIC_OR_DEPARTMENT_OTHER): Payer: Medicare HMO | Admitting: Internal Medicine

## 2017-11-25 ENCOUNTER — Inpatient Hospital Stay: Payer: Medicare HMO | Attending: Internal Medicine

## 2017-11-25 ENCOUNTER — Telehealth: Payer: Self-pay | Admitting: Internal Medicine

## 2017-11-25 ENCOUNTER — Inpatient Hospital Stay: Payer: Medicare HMO

## 2017-11-25 ENCOUNTER — Encounter: Payer: Self-pay | Admitting: Internal Medicine

## 2017-11-25 VITALS — BP 135/88 | HR 72 | Temp 97.6°F | Resp 18 | Ht 70.0 in | Wt 179.3 lb

## 2017-11-25 VITALS — BP 131/67 | HR 70 | Temp 97.4°F | Resp 16

## 2017-11-25 DIAGNOSIS — Z791 Long term (current) use of non-steroidal anti-inflammatories (NSAID): Secondary | ICD-10-CM

## 2017-11-25 DIAGNOSIS — Z85828 Personal history of other malignant neoplasm of skin: Secondary | ICD-10-CM

## 2017-11-25 DIAGNOSIS — N400 Enlarged prostate without lower urinary tract symptoms: Secondary | ICD-10-CM | POA: Insufficient documentation

## 2017-11-25 DIAGNOSIS — R05 Cough: Secondary | ICD-10-CM

## 2017-11-25 DIAGNOSIS — Z79899 Other long term (current) drug therapy: Secondary | ICD-10-CM | POA: Diagnosis not present

## 2017-11-25 DIAGNOSIS — E785 Hyperlipidemia, unspecified: Secondary | ICD-10-CM | POA: Diagnosis not present

## 2017-11-25 DIAGNOSIS — Z7982 Long term (current) use of aspirin: Secondary | ICD-10-CM

## 2017-11-25 DIAGNOSIS — K219 Gastro-esophageal reflux disease without esophagitis: Secondary | ICD-10-CM

## 2017-11-25 DIAGNOSIS — D45 Polycythemia vera: Secondary | ICD-10-CM

## 2017-11-25 DIAGNOSIS — Z8673 Personal history of transient ischemic attack (TIA), and cerebral infarction without residual deficits: Secondary | ICD-10-CM | POA: Insufficient documentation

## 2017-11-25 DIAGNOSIS — I10 Essential (primary) hypertension: Secondary | ICD-10-CM | POA: Insufficient documentation

## 2017-11-25 DIAGNOSIS — I480 Paroxysmal atrial fibrillation: Secondary | ICD-10-CM | POA: Diagnosis not present

## 2017-11-25 LAB — CBC WITH DIFFERENTIAL (CANCER CENTER ONLY)
Abs Immature Granulocytes: 0.03 10*3/uL (ref 0.00–0.07)
Basophils Absolute: 0.1 10*3/uL (ref 0.0–0.1)
Basophils Relative: 1 %
EOS ABS: 0.2 10*3/uL (ref 0.0–0.5)
EOS PCT: 2 %
HEMATOCRIT: 53.4 % — AB (ref 39.0–52.0)
Hemoglobin: 17.5 g/dL — ABNORMAL HIGH (ref 13.0–17.0)
IMMATURE GRANULOCYTES: 0 %
LYMPHS ABS: 0.9 10*3/uL (ref 0.7–4.0)
Lymphocytes Relative: 10 %
MCH: 27.7 pg (ref 26.0–34.0)
MCHC: 32.8 g/dL (ref 30.0–36.0)
MCV: 84.6 fL (ref 80.0–100.0)
MONOS PCT: 8 %
Monocytes Absolute: 0.7 10*3/uL (ref 0.1–1.0)
NEUTROS PCT: 79 %
Neutro Abs: 7.1 10*3/uL (ref 1.7–7.7)
PLATELETS: 350 10*3/uL (ref 150–400)
RBC: 6.31 MIL/uL — ABNORMAL HIGH (ref 4.22–5.81)
RDW: 14.2 % (ref 11.5–15.5)
WBC Count: 8.9 10*3/uL (ref 4.0–10.5)
nRBC: 0 % (ref 0.0–0.2)

## 2017-11-25 LAB — CMP (CANCER CENTER ONLY)
ALK PHOS: 63 U/L (ref 38–126)
ALT: 16 U/L (ref 0–44)
ANION GAP: 12 (ref 5–15)
AST: 17 U/L (ref 15–41)
Albumin: 4.2 g/dL (ref 3.5–5.0)
BILIRUBIN TOTAL: 0.7 mg/dL (ref 0.3–1.2)
BUN: 12 mg/dL (ref 8–23)
CALCIUM: 9.6 mg/dL (ref 8.9–10.3)
CO2: 24 mmol/L (ref 22–32)
Chloride: 107 mmol/L (ref 98–111)
Creatinine: 1.12 mg/dL (ref 0.61–1.24)
GLUCOSE: 105 mg/dL — AB (ref 70–99)
POTASSIUM: 4.6 mmol/L (ref 3.5–5.1)
Sodium: 143 mmol/L (ref 135–145)
TOTAL PROTEIN: 6.9 g/dL (ref 6.5–8.1)

## 2017-11-25 LAB — LACTATE DEHYDROGENASE: LDH: 175 U/L (ref 98–192)

## 2017-11-25 NOTE — Progress Notes (Signed)
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    PRIOR THERAPY: None  CURRENT THERAPY: Phlebotomy on as-needed basis.  INTERVAL HISTORY: Dean Pratt 82 y.o. male returns to the clinic today for 11-month follow-up visit.  The patient is feeling fine today with no concerning complaints except for mild cough.  He denied having any recent weight loss or night sweats.  He has no nausea, vomiting, diarrhea or constipation.  He denied having any fever or chills.  He has no headache or visual changes.  The patient is here today for evaluation with repeat blood work and phlebotomy if needed.  MEDICAL HISTORY: Past Medical History:  Diagnosis Date  . Anxiety   . Atrial flutter ([place])    s/p afib and atrial flutter ablation 04/09/09  . Blood transfusion without reported diagnosis   . BPH (benign prostatic hyperplasia)   . Cancer ([place])    basal cell CA removed  . Cataract    removed with lens implants   . Colitis, ischemic ([place])    secondary to ebolism from afib 1/10  . Constipation   . GERD (gastroesophageal reflux disease)   . Gout   . HTN (hypertension)   . Hyperlipidemia   . Intracranial bleed ([place]) 04/10/10   Right occipital hematoma with a left homonymous hemianopsia   . Migraines   . Paroxysmal atrial fibrillation ([place])    s/p PVI 04/09/09 and 10/17/10  . Rosacea   . Stroke ([place]) 04/08/2010   hemorragic, anticoagulation stopped at that time  . Tuberculosis    s/p treatment 1964    ALLERGIES:  is allergic to ace inhibitors and warfarin and related.  MEDICATIONS:  Current Outpatient Medications  Medication Sig Dispense Refill  . acetaminophen (TYLENOL) 325 MG tablet Take 650 mg by mouth every 6 (six) hours as needed.      Marland Kitchen allopurinol (ZYLOPRIM) 100 MG tablet Take 100 mg by mouth at bedtime.      . Ascorbic  Acid (VITAMIN C) 1000 MG tablet Take 1,000 mg by mouth daily.     Marland Kitchen aspirin 81 MG tablet Take 81 mg by mouth daily.      . diclofenac sodium (VOLTAREN) 1 % GEL APPLY 2-4 GRAMS 2-3 TIMES DAILY AS NEEDED FOR PAIN 100 g 0  . diltiazem (CARDIZEM CD) 240 MG 24 hr capsule Take 1 tablet by mouth daily.    . fluorometholone (FML) 0.1 % ophthalmic suspension SHAKE LQ AND INT 1 GTT IN OU QID  0  . fluticasone (CUTIVATE) 0.05 % cream     . ibuprofen (ADVIL,MOTRIN) 200 MG tablet Take 200 mg by mouth every 6 (six) hours as needed.    Marland Kitchen LORazepam (ATIVAN) 1 MG tablet TAKE 1/2-1 TABLET TWICE DAILY AS NEEDED  3  . losartan (COZAAR) 25 MG tablet Take 25 mg by mouth daily.      . methocarbamol (ROBAXIN) 500 MG tablet Take 1 tablet (500 mg total) by mouth every 8 (eight) hours as needed for muscle spasms. 60 tablet 0  . NON FORMULARY Stool softener 100 mg... Take 1 capsule by mouth once daily.     . rosuvastatin (CRESTOR) 10 MG tablet Take 10 mg by mouth daily.  2   Current Facility-Administered Medications  Medication Dose Route Frequency Provider Last Rate Last Dose  . 0.9 %  sodium chloride infusion  500 mL Intravenous Once Irene Shipper, MD      . bupivacaine (MARCAINE) 0.5 % (with pres) injection 3 mL  3 mL Other Once Magnus Sinning, MD        SURGICAL HISTORY:  Past Surgical History:  Procedure Laterality Date  . ablation  04/09/2009   s/p afib and atrial flutter ablation by [facility]  . ABLATION OF DYSRHYTHMIC FOCUS  10/15/09   repeat afib ablation by [facility]  . APPENDECTOMY    . CATARACT EXTRACTION, BILATERAL     with lens implants   . COLONOSCOPY    . CORNEAL LACERATION REPAIR    . INGUINAL HERNIA REPAIR    . POLYPECTOMY    . TONSILLECTOMY    . VASECTOMY      REVIEW OF SYSTEMS:  A comprehensive review of systems was negative except for: Respiratory: positive for cough   PHYSICAL EXAMINATION: General appearance: alert, cooperative, fatigued and no distress Head: Normocephalic, without obvious  abnormality, atraumatic Neck: no adenopathy, no JVD, supple, symmetrical, trachea midline and thyroid not enlarged, symmetric, no tenderness/mass/nodules Lymph nodes: Cervical, supraclavicular, and axillary nodes normal. Resp: clear to auscultation bilaterally Back: symmetric, no curvature. ROM normal. No CVA tenderness. Cardio: regular rate and rhythm, S1, S2 normal, no murmur, click, rub or gallop GI: soft, non-tender; bowel sounds normal; no masses,  no organomegaly Extremities: extremities normal, atraumatic, no cyanosis or edema  ECOG PERFORMANCE STATUS: 1 - Symptomatic but completely ambulatory  Blood pressure 135/88, pulse 72, temperature 97.6 F (36.4 C), temperature source Oral, resp. rate 18, height 5\' 10"  (1.778 m), weight 179 lb 4.8 oz (81.3 kg), SpO2 100 %.  LABORATORY DATA: Lab Results  Component Value Date   WBC 8.9 11/25/2017   HGB 17.5 (H) 11/25/2017   HCT 53.4 (H) 11/25/2017   MCV 84.6 11/25/2017   PLT 350 11/25/2017      Chemistry      Component Value Date/Time   NA 143 11/25/2017 0752   K 4.6 11/25/2017 0752   CL 107 11/25/2017 0752   CO2 24 11/25/2017 0752   BUN 12 11/25/2017 0752   CREATININE 1.12 11/25/2017 0752      Component Value Date/Time   CALCIUM 9.6 11/25/2017 0752   ALKPHOS 63 11/25/2017 0752   AST 17 11/25/2017 0752   ALT 16 11/25/2017 0752   BILITOT 0.7 11/25/2017 0752       RADIOGRAPHIC STUDIES: No results found.  ASSESSMENT AND PLAN: This is a very pleasant 82 years old white male with recently diagnosed polycythemia vera with positive Jak 2 mutation.  The patient is also currently on hormonal therapy for androgen deficiency. The patient is feeling fine today with no concerning complaints.  CBC today showed elevated hemoglobin and hematocrit. I recommended for the patient to proceed with phlebotomy today as scheduled. I will see him back for follow-up visit in 3 months for evaluation with repeat CBC, complaints metabolic panel, LDH  and phlebotomy if needed. He was advised to call immediately if he has any concerning symptoms in the interval. The patient voices understanding of current disease status and treatment options and is in agreement with the current care plan.  All questions were answered. The patient knows to call the clinic with any problems, questions or concerns. We can certainly see the patient much sooner if necessary.  I spent 10 minutes counseling the patient face to face. The total time spent in the appointment was 15 minutes.  Disclaimer: This note  was dictated with voice recognition software. Similar sounding words can inadvertently be transcribed and may not be corrected upon review.

## 2017-11-25 NOTE — Telephone Encounter (Signed)
Gave pt avs and calendar  °

## 2017-11-25 NOTE — Patient Instructions (Signed)

## 2017-11-25 NOTE — Progress Notes (Signed)
Dean Pratt presents today for phlebotomy per MD orders. Phlebotomy procedure started at 0914 using a 16 Gauge in R AC and ended at 0937. 327 cc removed. Patient tolerated procedure well. IV needle removed intact. Per Dr. Julien Nordmann no need to phlebotomize further, ok to discharge with 327cc removed today.

## 2017-12-07 ENCOUNTER — Telehealth (INDEPENDENT_AMBULATORY_CARE_PROVIDER_SITE_OTHER): Payer: Self-pay | Admitting: *Deleted

## 2017-12-07 ENCOUNTER — Telehealth (INDEPENDENT_AMBULATORY_CARE_PROVIDER_SITE_OTHER): Payer: Self-pay | Admitting: Physical Medicine and Rehabilitation

## 2017-12-07 NOTE — Telephone Encounter (Signed)
Service Order:[id number] Auth Effective Date:12/03/2019Auth End Date:03/02/2020Initiated Date:12/03/2019Decision Date:12/03/2019Decision Type :InitialCase Status:Approved   Pt is scheduled with driver 94/94/47.

## 2017-12-07 NOTE — Telephone Encounter (Signed)
Service Order:[id number] Auth Effective Date:12/03/2019Auth End Date:03/02/2020Initiated Date:12/03/2019Decision Date:12/03/2019Decision Type :InitialCase Status:Approved for [id number] and [phone]

## 2017-12-07 NOTE — Telephone Encounter (Signed)
Ask how he felt last injection helped, not how long but how much, and if helped schedule repeat, if no help then OV

## 2017-12-07 NOTE — Telephone Encounter (Signed)
Patient is going to try to locate his pain diary and call us back.

## 2017-12-13 DIAGNOSIS — H10413 Chronic giant papillary conjunctivitis, bilateral: Secondary | ICD-10-CM | POA: Diagnosis not present

## 2017-12-13 DIAGNOSIS — H16223 Keratoconjunctivitis sicca, not specified as Sjogren's, bilateral: Secondary | ICD-10-CM | POA: Diagnosis not present

## 2017-12-13 DIAGNOSIS — Z961 Presence of intraocular lens: Secondary | ICD-10-CM | POA: Diagnosis not present

## 2017-12-14 ENCOUNTER — Other Ambulatory Visit: Payer: Self-pay | Admitting: Cardiology

## 2017-12-14 MED ORDER — DILTIAZEM HCL ER COATED BEADS 240 MG PO CP24: 240.0000 mg | ORAL_CAPSULE | Freq: Every day | ORAL | 0 refills | Status: DC

## 2017-12-14 NOTE — Telephone Encounter (Signed)
° ° °  1. Which medications need to be refilled? (please list name of each medication and dose if known) Diltiazem 24hr ER 240mg  caps 1QD  2. Which pharmacy/location (including street and city if local pharmacy) is medication to be sent to?[facility]  3. Do they need a 30 day or 90 day supply? [place]

## 2017-12-14 NOTE — Telephone Encounter (Signed)
[facility] to get patient to appointment scheduled on 12/24/17 with Dr. Agustin Cree.

## 2017-12-23 ENCOUNTER — Ambulatory Visit (INDEPENDENT_AMBULATORY_CARE_PROVIDER_SITE_OTHER): Payer: Self-pay

## 2017-12-23 ENCOUNTER — Encounter (INDEPENDENT_AMBULATORY_CARE_PROVIDER_SITE_OTHER): Payer: Self-pay | Admitting: Physical Medicine and Rehabilitation

## 2017-12-23 ENCOUNTER — Ambulatory Visit (INDEPENDENT_AMBULATORY_CARE_PROVIDER_SITE_OTHER): Payer: Medicare HMO | Admitting: Physical Medicine and Rehabilitation

## 2017-12-23 VITALS — BP 138/74 | HR 62 | Temp 97.4°F

## 2017-12-23 DIAGNOSIS — M47816 Spondylosis without myelopathy or radiculopathy, lumbar region: Secondary | ICD-10-CM | POA: Diagnosis not present

## 2017-12-23 MED ORDER — METHYLPREDNISOLONE ACETATE 80 MG/ML IJ SUSP
80.0000 mg | Freq: Once | INTRAMUSCULAR | Status: AC
  Administered 2017-12-23: 80 mg

## 2017-12-23 MED ORDER — BUPIVACAINE HCL 0.5 % IJ SOLN
3.0000 mL | Freq: Once | INTRAMUSCULAR | Status: AC
  Administered 2017-12-23: 3 mL

## 2017-12-23 NOTE — Progress Notes (Signed)
Dean Pratt - 82 y.o. male MRN [id number]  Date of birth: 08/16/1935  Office Visit Note: Visit Date: 12/23/2017 PCP: [name], MD Referred by: [name], MD  Subjective: Chief Complaint  Patient presents with  . Lower Back - Pain   HPI: Dean Pratt is a 82 y.o. male who comes in today For planned bilateral medial branch block of the L2-3 and L3-4 facet joints bilaterally.  This is for bilateral axial low back pain without leg pain.  MRI and x-ray evidence of facet arthritis and exam consistent with pain with facet joint loading.  These have been previously documented.  He has had 1 set of medial branch blocks which gave him more than 70% relief of his back pain.  His biggest complaint is going home and making the bed which she was successfully able to do with the first medial branch block.  ROS Otherwise per HPI.  Assessment & Plan: Visit Diagnoses:  1. Spondylosis without myelopathy or radiculopathy, lumbar region     Plan: No additional findings.   Meds & Orders:  Meds ordered this encounter  Medications  . bupivacaine (MARCAINE) 0.5 % (with pres) injection 3 mL  . methylPREDNISolone acetate (DEPO-MEDROL) injection 80 mg    Orders Placed This Encounter  Procedures  . Facet Injection  . XR C-ARM NO REPORT    Follow-up: Return for Review Pain Diary.   Procedures: No procedures performed  Lumbar Diagnostic Facet Joint Nerve Block with Fluoroscopic Guidance   Patient: Dean Pratt      Date of Birth: May 06, 1935 MRN: [id number] PCP: [name], MD      Visit Date: 12/23/2017   Universal Protocol:    Date/Time: 02/20/205:59 AM  Consent Given By: the patient  Position: PRONE  Additional Comments: Vital signs were monitored before and after the procedure. Patient was prepped and draped in the usual sterile fashion. The correct patient, procedure, and site was verified.   Injection Procedure Details:  Procedure Site One Meds Administered:  Meds  ordered this encounter  Medications  . bupivacaine (MARCAINE) 0.5 % (with pres) injection 3 mL  . methylPREDNISolone acetate (DEPO-MEDROL) injection 80 mg     Laterality: Bilateral  Location/Site:  L2-L3 L3-L4  Needle size: 22 ga.  Needle type:spinal  Needle Placement: Oblique pedical  Findings:   -Comments: There was excellent flow of contrast along the articular pillars without intravascular flow.  Procedure Details: The fluoroscope beam is vertically oriented in AP and then obliqued 15 to 20 degrees to the ipsilateral side of the desired nerve to achieve the "Scotty dog" appearance.  The skin over the target area of the junction of the superior articulating process and the transverse process (sacral ala if blocking the L5 dorsal rami) was locally anesthetized with a 1 ml volume of 1% Lidocaine without Epinephrine.  The spinal needle was inserted and advanced in a trajectory view down to the target.   After contact with periosteum and negative aspirate for blood and CSF, correct placement without intravascular or epidural spread was confirmed by injecting 0.5 ml. of Isovue-250.  A spot radiograph was obtained of this image.    Next, a 0.5 ml. volume of the injectate described above was injected. The needle was then redirected to the other facet joint nerves mentioned above if needed.  Prior to the procedure, the patient was given a Pain Diary which was completed for baseline measurements.  After the procedure, the patient rated their pain every 30 minutes  and will continue rating at this frequency for a total of 5 hours.  The patient has been asked to complete the Diary and return to [place] by mail, fax or hand delivered as soon as possible.   Additional Comments:  The patient tolerated the procedure well Dressing: 2 x 2 sterile gauze and Band-Aid    Post-procedure details: Patient was observed during the procedure. Post-procedure instructions were reviewed.  Patient left the  clinic in stable condition.   Clinical History: MRI LUMBAR SPINE WITHOUT CONTRAST  TECHNIQUE: Multiplanar, multisequence MR imaging of the lumbar spine was performed. No intravenous contrast was administered.  COMPARISON:  Lumbar MRI 10/16/2010.  Abdominal CT 02/28/2008.  FINDINGS: Segmentation: Conventional anatomy assumed, with the last open disc space designated L5-S1.This numbering is concordant with previous imaging.  Alignment: There is a convex right scoliosis centered at L2. The lateral alignment is stable and near anatomic.  Vertebrae: No worrisome osseous lesion, acute fracture or pars defect. There are scattered endplate degenerative changes. The lumbar pedicles are somewhat short on a congenital basis. The visualized sacroiliac joints appear unremarkable.  Conus medullaris: Extends to the T12-L1 level and appears normal.  Paraspinal and other soft tissues: Right renal cysts are partially imaged.  Disc levels:  T12-L1: Chronic disc extrusion with cephalad extension has largely involuted. No mass effect on the distal cord or foraminal compromise.  L1-2: Chronic degenerative disc disease with loss of disc height, annular disc bulging eccentric to the left and circumferential endplate osteophytes. Mild facet hypertrophy. No significant spinal stenosis or nerve root encroachment.  L2-3: Mildly progressive loss of disc height with annular disc bulging and endplate osteophytes asymmetric to the left. Mild facet and ligamentous hypertrophy. There is mild asymmetric narrowing of the left lateral recess and left foramen which has mildly progressed.  L3-4: Stable mild loss of disc height with annular disc bulging and asymmetric facet hypertrophy on the right. Mild narrowing of the right lateral recess and both foramina.  L4-5: Mildly progressive loss of disc height annular disc bulging and endplate osteophytes asymmetric to the right.  Right extraforaminal disc extrusion has involuted with improved right foraminal narrowing, now moderate. There is facet and ligamentous hypertrophy contributing to mild narrowing of the lateral recesses.  L5-S1: Stable chronic degenerative disc disease with loss of disc height and annular disc bulging eccentric the right. Mild facet hypertrophy. Stable mild right-greater-than-left foraminal narrowing.  IMPRESSION: 1. Compared with the prior study from 2012, no acute findings are seen. 2. There is minimally progressive multilevel spondylosis with disc bulging and endplate osteophytes. These contribute to mild lateral recess and foraminal narrowing as described above. No acute findings or definite nerve root compression. Right extraforaminal disc extrusion at L4-5 has involuted and there is less right foraminal narrowing.   Electronically Signed   By: Richardean Sale M.D.   On: 06/12/2017 14:41   He reports that he has quit smoking. He has never used smokeless tobacco. No results for input(s): HGBA1C, LABURIC in the last 8760 hours.  Objective:  VS:  HT:    WT:   BMI:     BP:138/74  HR:62bpm  TEMP:(!) 97.4 F (36.3 C)(Oral)  RESP:  Physical Exam  Ortho Exam Imaging: No results found.  Past Medical/Family/Surgical/Social History: Medications & Allergies reviewed per EMR, new medications updated. Patient Active Problem List   Diagnosis Date Noted  . Status post ablation of atrial fibrillation 12/24/2017  . Polycythemia vera ([place]) 04/13/2017  . OTHER AND UNSPECIFIED COAGULATION DEFECTS 01/02/2010  .  ATRIAL FIBRILLATION 01/02/2010  . SNORING 02/27/2009  . ISCHEMIC COLITIS 03/27/2008  . ABDOMINAL PAIN-RLQ 03/22/2008  . ABNORMAL FINDINGS GI TRACT 03/22/2008  . PERSONAL HX COLONIC POLYPS 03/22/2008  . COLONIC POLYPS 03/21/2008  . Dyslipidemia 03/21/2008  . GOUT 03/21/2008  . ANXIETY 03/21/2008  . MIGRAINE HEADACHE 03/21/2008  . CONSTIPATION, CHRONIC 03/21/2008  .  ROSACEA 03/21/2008  . Essential hypertension 03/21/2008  . CARCINOMA, BASAL CELL, HX OF 03/21/2008  . TUBERCULOSIS, HX OF 03/21/2008  . ATRIAL FIBRILLATION, HX OF 03/21/2008  . BENIGN PROSTATIC HYPERTROPHY, HX OF 03/21/2008   Past Medical History:  Diagnosis Date  . Anxiety   . Atrial flutter ([place])    s/p afib and atrial flutter ablation 04/09/09  . Blood transfusion without reported diagnosis   . BPH (benign prostatic hyperplasia)   . Cancer ([place])    basal cell CA removed  . Cataract    removed with lens implants   . Colitis, ischemic ([place])    secondary to ebolism from afib 1/10  . Constipation   . GERD (gastroesophageal reflux disease)   . Gout   . HTN (hypertension)   . Hyperlipidemia   . Intracranial bleed ([place]) 04/10/10   Right occipital hematoma with a left homonymous hemianopsia   . Migraines   . Paroxysmal atrial fibrillation ([place])    s/p PVI 04/09/09 and 10/17/10  . Rosacea   . Stroke ([place]) 04/08/2010   hemorragic, anticoagulation stopped at that time  . Tuberculosis    s/p treatment 1964   Family History  Problem Relation Age of Onset  . Liver cancer Father   . Prostate cancer Father   . Coronary artery disease Father   . COPD Mother   . Breast cancer Sister   . Colon cancer Neg Hx   . Rectal cancer Neg Hx   . Stomach cancer Neg Hx   . Esophageal cancer Neg Hx   . Colon polyps Neg Hx    Past Surgical History:  Procedure Laterality Date  . ablation  04/09/2009   s/p afib and atrial flutter ablation by JA  . ABLATION OF DYSRHYTHMIC FOCUS  10/15/09   repeat afib ablation by JA  . APPENDECTOMY    . CATARACT EXTRACTION, BILATERAL     with lens implants   . COLONOSCOPY    . CORNEAL LACERATION REPAIR    . INGUINAL HERNIA REPAIR    . POLYPECTOMY    . TONSILLECTOMY    . VASECTOMY     Social History   Occupational History  . Not on file  Tobacco Use  . Smoking status: Former Research scientist (life sciences)  . Smokeless tobacco: Never Used  Substance and Sexual Activity  .  Alcohol use: No  . Drug use: No  . Sexual activity: Not on file

## 2017-12-23 NOTE — Patient Instructions (Signed)

## 2017-12-23 NOTE — Progress Notes (Signed)
 .  Numeric Pain Rating Scale and Functional Assessment Average Pain 7   In the last MONTH (on 0-10 scale) has pain interfered with the following?  1. General activity like being  able to carry out your everyday physical activities such as walking, climbing stairs, carrying groceries, or moving a chair?  Rating(4)   +Driver, -BT, -Dye Allergies.  

## 2017-12-24 ENCOUNTER — Ambulatory Visit (INDEPENDENT_AMBULATORY_CARE_PROVIDER_SITE_OTHER): Payer: Medicare HMO | Admitting: Cardiology

## 2017-12-24 ENCOUNTER — Encounter: Payer: Self-pay | Admitting: Cardiology

## 2017-12-24 VITALS — BP 140/70 | HR 52 | Wt 175.1 lb

## 2017-12-24 DIAGNOSIS — E785 Hyperlipidemia, unspecified: Secondary | ICD-10-CM

## 2017-12-24 DIAGNOSIS — Z8679 Personal history of other diseases of the circulatory system: Secondary | ICD-10-CM | POA: Diagnosis not present

## 2017-12-24 DIAGNOSIS — I10 Essential (primary) hypertension: Secondary | ICD-10-CM | POA: Diagnosis not present

## 2017-12-24 DIAGNOSIS — I480 Paroxysmal atrial fibrillation: Secondary | ICD-10-CM | POA: Diagnosis not present

## 2017-12-24 DIAGNOSIS — Z9889 Other specified postprocedural states: Secondary | ICD-10-CM

## 2017-12-24 NOTE — Progress Notes (Signed)
Cardiology Office Note:    Date:  12/24/2017   ID:  Dean Pratt, DOB 05-01-1935, MRN [id number]  PCP:  [name], MD  Cardiologist:  Jenne Campus, MD    Referring MD: [name], MD   Chief Complaint  Patient presents with  . Follow-up  Doing well  History of Present Illness:    Dean Pratt is a 82 y.o. male with complex past medical history.  He does have history of paroxysmal atrial fibrillation also history of embolization to his GI tract which felt to be related to atrial fibrillation also have history of spontaneous intracranial bleeding involve occipital area.  He did have atrial fibrillation ablation done twice he is not anticoagulated because of history of intracranial bleeding.  Denies having any chest pain tightness squeezing pressure burning chest no palpitations but he never felt palpitations when he gets his atrial fibrillation overall clinically doing well he said he is trying to take care of his health he is active he exercised on the regular basis and he is trying to eat right.  Past Medical History:  Diagnosis Date  . Anxiety   . Atrial flutter ([place])    s/p afib and atrial flutter ablation 04/09/09  . Blood transfusion without reported diagnosis   . BPH (benign prostatic hyperplasia)   . Cancer ([place])    basal cell CA removed  . Cataract    removed with lens implants   . Colitis, ischemic ([place])    secondary to ebolism from afib 1/10  . Constipation   . GERD (gastroesophageal reflux disease)   . Gout   . HTN (hypertension)   . Hyperlipidemia   . Intracranial bleed ([place]) 04/10/10   Right occipital hematoma with a left homonymous hemianopsia   . Migraines   . Paroxysmal atrial fibrillation ([place])    s/p PVI 04/09/09 and 10/17/10  . Rosacea   . Stroke ([place]) 04/08/2010   hemorragic, anticoagulation stopped at that time  . Tuberculosis    s/p treatment 1964    Past Surgical History:  Procedure Laterality Date  . ablation  04/09/2009   s/p afib and  atrial flutter ablation by JA  . ABLATION OF DYSRHYTHMIC FOCUS  10/15/09   repeat afib ablation by JA  . APPENDECTOMY    . CATARACT EXTRACTION, BILATERAL     with lens implants   . COLONOSCOPY    . CORNEAL LACERATION REPAIR    . INGUINAL HERNIA REPAIR    . POLYPECTOMY    . TONSILLECTOMY    . VASECTOMY      Current Medications: Current Meds  Medication Sig  . acetaminophen (TYLENOL) 325 MG tablet Take 650 mg by mouth every 6 (six) hours as needed.    Marland Kitchen allopurinol (ZYLOPRIM) 100 MG tablet Take 100 mg by mouth at bedtime.    . Ascorbic Acid (VITAMIN C) 1000 MG tablet Take 1,000 mg by mouth daily.   Marland Kitchen aspirin 81 MG tablet Take 81 mg by mouth daily.    . diclofenac sodium (VOLTAREN) 1 % GEL APPLY 2-4 GRAMS 2-3 TIMES DAILY AS NEEDED FOR PAIN  . diltiazem (CARDIZEM CD) 240 MG 24 hr capsule Take 1 capsule (240 mg total) by mouth daily.  . fluorometholone (FML) 0.1 % ophthalmic suspension SHAKE LQ AND INT 1 GTT IN OU QID  . fluticasone (CUTIVATE) 0.05 % cream   . ibuprofen (ADVIL,MOTRIN) 200 MG tablet Take 200 mg by mouth every 6 (six) hours as needed.  Marland Kitchen LORazepam (ATIVAN) 1  MG tablet TAKE 1/2-1 TABLET TWICE DAILY AS NEEDED  . losartan (COZAAR) 25 MG tablet Take 25 mg by mouth daily.    . methocarbamol (ROBAXIN) 500 MG tablet Take 1 tablet (500 mg total) by mouth every 8 (eight) hours as needed for muscle spasms.  . NON FORMULARY Stool softener 100 mg... Take 1 capsule by mouth once daily.   . rosuvastatin (CRESTOR) 10 MG tablet Take 10 mg by mouth daily.   Current Facility-Administered Medications for the 12/24/17 encounter (Office Visit) with Park Liter, MD  Medication  . 0.9 %  sodium chloride infusion  . bupivacaine (MARCAINE) 0.5 % (with pres) injection 3 mL     Allergies:   Ace inhibitors and Warfarin and related   Social History   Socioeconomic History  . Marital status: Married    Spouse name: Not on file  . Number of children: Not on file  . Years of  education: Not on file  . Highest education level: Not on file  Occupational History  . Not on file  Social Needs  . Financial resource strain: Not on file  . Food insecurity:    Worry: Not on file    Inability: Not on file  . Transportation needs:    Medical: Not on file    Non-medical: Not on file  Tobacco Use  . Smoking status: Former Research scientist (life sciences)  . Smokeless tobacco: Never Used  Substance and Sexual Activity  . Alcohol use: No  . Drug use: No  . Sexual activity: Not on file  Lifestyle  . Physical activity:    Days per week: Not on file    Minutes per session: Not on file  . Stress: Not on file  Relationships  . Social connections:    Talks on phone: Not on file    Gets together: Not on file    Attends religious service: Not on file    Active member of club or organization: Not on file    Attends meetings of clubs or organizations: Not on file    Relationship status: Not on file  Other Topics Concern  . Not on file  Social History Narrative   Pt lives in [place].    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  He is married and lives at home with his wife.  He used to smoke but quit  smoking many years ago.  Does get regular exercise.  No alcohol use.      Family History: The patient's family history includes Breast cancer in his sister; COPD in his mother; Coronary artery disease in his father; Liver cancer in his father; Prostate cancer in his father. There is no history of Colon cancer, Rectal cancer, Stomach cancer, Esophageal cancer, or Colon polyps. ROS:   Please see the history of present illness.    All 14 point review of systems negative except as described per history of present illness  EKGs/Labs/Other Studies Reviewed:    Atrial fibrillation controlled ventricular rate Q waves in lead V1 V2.  Nonspecific ST segment changes   Recent Labs: 11/25/2017: ALT 16; BUN 12; Creatinine 1.12; Hemoglobin 17.5;  Platelet Count 350; Potassium 4.6; Sodium 143  Recent Lipid Panel No results found for: CHOL, TRIG, HDL, CHOLHDL, VLDL, LDLCALC, LDLDIRECT  Physical Exam:    VS:  BP 140/70   Pulse (!) 52   Wt 175 lb 1.9 oz (79.4 kg)   SpO2 97%  BMI 25.13 kg/m     Wt Readings from Last 3 Encounters:  12/24/17 175 lb 1.9 oz (79.4 kg)  11/25/17 179 lb 4.8 oz (81.3 kg)  10/26/17 175 lb (79.4 kg)     GEN:  Well nourished, well developed in no acute distress HEENT: Normal NECK: No JVD; No carotid bruits LYMPHATICS: No lymphadenopathy CARDIAC: Irregularly irregular, no murmurs, no rubs, no gallops RESPIRATORY:  Clear to auscultation without rales, wheezing or rhonchi  ABDOMEN: Soft, non-tender, non-distended MUSCULOSKELETAL:  No edema; No deformity  SKIN: Warm and dry LOWER EXTREMITIES: no swelling NEUROLOGIC:  Alert and oriented x 3 PSYCHIATRIC:  Normal affect   ASSESSMENT:    1. Paroxysmal atrial fibrillation (HCC)   2. Status post ablation of atrial fibrillation   3. Essential hypertension   4. Dyslipidemia    PLAN:    In order of problems listed above:  1. Paroxysmal atrial fibrillation: Chads 2 vascular equals 5.  EKG done today showed atrial fibrillation.  Again he is not anticoagulated because of history of spontaneous intracranial bleeding.  Obviously that is a very difficult scenario.  We may consider watchman device however implantation of this device will require anticoagulation for a month I will refer him back to our EP team for consideration of antiarrhythmic therapy may be an hour atrial fibrillation ablation.  In the meantime I will schedule him to have echocardiogram to assess left atrial size. 2. Essential hypertension blood pressure appears to be well controlled today and will continue present management. 3. Dyslipidemia he is on statin with excellently controlled cholesterol.   Medication Adjustments/Labs and Tests Ordered: Current medicines are reviewed at length with  the patient today.  Concerns regarding medicines are outlined above.  No orders of the defined types were placed in this encounter.  Medication changes: No orders of the defined types were placed in this encounter.   Signed, Park Liter, MD, [facility] 12/24/2017 10:02 AM    [place]

## 2017-12-24 NOTE — Patient Instructions (Signed)
Medication Instructions:  Your physician recommends that you continue on your current medications as directed. Please refer to the Current Medication list given to you today.  If you need a refill on your cardiac medications before your next appointment, please call your pharmacy.   Lab work: None.  If you have labs (blood work) drawn today and your tests are completely normal, you will receive your results only by: Marland Kitchen MyChart Message (if you have MyChart) OR . A paper copy in the mail If you have any lab test that is abnormal or we need to change your treatment, we will call you to review the results.  Testing/Procedures: Your physician has requested that you have an echocardiogram. Echocardiography is a painless test that uses sound waves to create images of your heart. It provides your doctor with information about the size and shape of your heart and how well your heart's chambers and valves are working. This procedure takes approximately one hour. There are no restrictions for this procedure.    Follow-Up: At [facility], you and your health needs are our priority.  As part of our continuing mission to provide you with exceptional heart care, we have created designated Provider Care Teams.  These Care Teams include your primary Cardiologist (physician) and Advanced Practice Providers (APPs -  Physician Assistants and Nurse Practitioners) who all work together to provide you with the care you need, when you need it. You will need a follow up appointment in 1 months.  Please call our office 2 months in advance to schedule this appointment.  You may see No primary care provider on file. or another member of our [employer] Provider Team in [place]: Shirlee More, MD . Jyl Heinz, MD  Any Other Special Instructions Will Be Listed Below (If Applicable).  Dr. Agustin Cree has referred you to see Dr. Rayann Heman with electrophysiology. Their office should call you within one week for an  appointment. If you don't hear from them please call our office.

## 2017-12-30 ENCOUNTER — Ambulatory Visit (INDEPENDENT_AMBULATORY_CARE_PROVIDER_SITE_OTHER): Payer: Medicare HMO

## 2017-12-30 ENCOUNTER — Encounter (HOSPITAL_COMMUNITY): Payer: Self-pay | Admitting: Emergency Medicine

## 2017-12-30 ENCOUNTER — Ambulatory Visit (HOSPITAL_COMMUNITY): Payer: Medicare HMO | Attending: Internal Medicine

## 2017-12-30 ENCOUNTER — Ambulatory Visit (HOSPITAL_COMMUNITY)
Admission: EM | Admit: 2017-12-30 | Discharge: 2017-12-30 | Disposition: A | Discharge status: Home / Self Care | Payer: Medicare HMO | Attending: Family Medicine | Admitting: Family Medicine

## 2017-12-30 DIAGNOSIS — J209 Acute bronchitis, unspecified: Secondary | ICD-10-CM

## 2017-12-30 DIAGNOSIS — R05 Cough: Secondary | ICD-10-CM | POA: Diagnosis not present

## 2017-12-30 DIAGNOSIS — I480 Paroxysmal atrial fibrillation: Secondary | ICD-10-CM | POA: Insufficient documentation

## 2017-12-30 MED ORDER — HYDROCODONE-HOMATROPINE 5-1.5 MG/5ML PO SYRP: 5.0000 mL | ORAL_SOLUTION | Freq: Four times a day (QID) | ORAL | 0 refills | Status: DC | PRN

## 2017-12-30 MED ORDER — AMOXICILLIN-POT CLAVULANATE 875-125 MG PO TABS: 1.0000 | ORAL_TABLET | Freq: Two times a day (BID) | ORAL | 0 refills | Status: DC

## 2017-12-30 NOTE — ED Provider Notes (Signed)
[place]    CSN: [id number] Arrival date & time: 12/30/17  1905     History   Chief Complaint Chief Complaint  Patient presents with  . Flu-Like Symptoms    HPI Dean Pratt is a 82 y.o. male.   This is an 82 year old gentleman making his first appearance of the urgent care at [facility].  He has 3-4 days of cough and congestion.  No known fever  Remote h/o TB in 1964.  No hemoptysis or shortness of breath     Past Medical History:  Diagnosis Date  . Anxiety   . Atrial flutter ([place])    s/p afib and atrial flutter ablation 04/09/09  . Blood transfusion without reported diagnosis   . BPH (benign prostatic hyperplasia)   . Cancer ([place])    basal cell CA removed  . Cataract    removed with lens implants   . Colitis, ischemic ([place])    secondary to ebolism from afib 1/10  . Constipation   . GERD (gastroesophageal reflux disease)   . Gout   . HTN (hypertension)   . Hyperlipidemia   . Intracranial bleed ([place]) 04/10/10   Right occipital hematoma with a left homonymous hemianopsia   . Migraines   . Paroxysmal atrial fibrillation ([place])    s/p PVI 04/09/09 and 10/17/10  . Rosacea   . Stroke ([place]) 04/08/2010   hemorragic, anticoagulation stopped at that time  . Tuberculosis    s/p treatment 1964    Patient Active Problem List   Diagnosis Date Noted  . Status post ablation of atrial fibrillation 12/24/2017  . Polycythemia vera ([place]) 04/13/2017  . OTHER AND UNSPECIFIED COAGULATION DEFECTS 01/02/2010  . ATRIAL FIBRILLATION 01/02/2010  . SNORING 02/27/2009  . ISCHEMIC COLITIS 03/27/2008  . ABDOMINAL PAIN-RLQ 03/22/2008  . ABNORMAL FINDINGS GI TRACT 03/22/2008  . PERSONAL HX COLONIC POLYPS 03/22/2008  . COLONIC POLYPS 03/21/2008  . Dyslipidemia 03/21/2008  . GOUT 03/21/2008  . ANXIETY 03/21/2008  . MIGRAINE HEADACHE 03/21/2008  . CONSTIPATION, CHRONIC 03/21/2008  . ROSACEA 03/21/2008  . Essential hypertension 03/21/2008  . CARCINOMA, BASAL CELL, HX  OF 03/21/2008  . TUBERCULOSIS, HX OF 03/21/2008  . ATRIAL FIBRILLATION, HX OF 03/21/2008  . BENIGN PROSTATIC HYPERTROPHY, HX OF 03/21/2008    Past Surgical History:  Procedure Laterality Date  . ablation  04/09/2009   s/p afib and atrial flutter ablation by JA  . ABLATION OF DYSRHYTHMIC FOCUS  10/15/09   repeat afib ablation by JA  . APPENDECTOMY    . CATARACT EXTRACTION, BILATERAL     with lens implants   . COLONOSCOPY    . CORNEAL LACERATION REPAIR    . INGUINAL HERNIA REPAIR    . POLYPECTOMY    . TONSILLECTOMY    . VASECTOMY         Home Medications    Prior to Admission medications   Medication Sig Start Date End Date Taking? Authorizing Provider  acetaminophen (TYLENOL) 325 MG tablet Take 650 mg by mouth every 6 (six) hours as needed.      [provider]  allopurinol (ZYLOPRIM) 100 MG tablet Take 100 mg by mouth at bedtime.      [provider]  amoxicillin-clavulanate (AUGMENTIN) 875-125 MG tablet Take 1 tablet by mouth every 12 (twelve) hours. 12/30/17   Robyn Haber, MD  Ascorbic Acid (VITAMIN C) 1000 MG tablet Take 1,000 mg by mouth daily.     [provider]  aspirin 81 MG tablet  Take 81 mg by mouth daily.      [provider]  diclofenac sodium (VOLTAREN) 1 % GEL APPLY 2-4 GRAMS 2-3 TIMES DAILY AS NEEDED FOR PAIN 05/05/17   Mcarthur Rossetti, MD  diltiazem (CARDIZEM CD) 240 MG 24 hr capsule Take 1 capsule (240 mg total) by mouth daily. 12/14/17   Richardo Priest, MD  fluorometholone (FML) 0.1 % ophthalmic suspension SHAKE LQ AND INT 1 GTT IN OU QID 02/08/17   [provider]  fluticasone (CUTIVATE) 0.05 % cream  07/14/17   [provider]  HYDROcodone-homatropine (HYDROMET) 5-1.5 MG/5ML syrup Take 5 mLs by mouth every 6 (six) hours as needed for cough. 12/30/17   Robyn Haber, MD  ibuprofen (ADVIL,MOTRIN) 200 MG tablet Take 200 mg by mouth every 6 (six) hours as needed.    [provider]    LORazepam (ATIVAN) 1 MG tablet TAKE 1/2-1 TABLET TWICE DAILY AS NEEDED 03/18/17   [provider]  losartan (COZAAR) 25 MG tablet Take 25 mg by mouth daily.      [provider]  methocarbamol (ROBAXIN) 500 MG tablet Take 1 tablet (500 mg total) by mouth every 8 (eight) hours as needed for muscle spasms. 07/13/17   Magnus Sinning, MD  NON FORMULARY Stool softener 100 mg... Take 1 capsule by mouth once daily.     [provider]  rosuvastatin (CRESTOR) 10 MG tablet Take 10 mg by mouth daily. 09/06/17   [provider]    Family History Family History  Problem Relation Age of Onset  . Liver cancer Father   . Prostate cancer Father   . Coronary artery disease Father   . COPD Mother   . Breast cancer Sister   . Colon cancer Neg Hx   . Rectal cancer Neg Hx   . Stomach cancer Neg Hx   . Esophageal cancer Neg Hx   . Colon polyps Neg Hx     Social History Social History   Tobacco Use  . Smoking status: Former Research scientist (life sciences)  . Smokeless tobacco: Never Used  Substance Use Topics  . Alcohol use: No  . Drug use: No     Allergies   Ace inhibitors and Warfarin and related   Review of Systems Review of Systems   Physical Exam Triage Vital Signs ED Triage Vitals [12/30/17 1937]  Enc Vitals Group     BP 131/77     Pulse Rate 82     Resp 20     Temp 97.9 F (36.6 C)     Temp Source Oral     SpO2 95 %     Weight      Height      Head Circumference      Peak Flow      Pain Score 8     Pain Loc      Pain Edu?      Excl. in [place]?    No data found.  Updated Vital Signs BP 131/77 (BP Location: Right Arm)   Pulse 82   Temp 97.9 F (36.6 C) (Oral)   Resp 20   SpO2 95%    Physical Exam Vitals signs and nursing note reviewed.  Constitutional:      General: He is not in acute distress.    Appearance: Normal appearance.  HENT:     Head: Normocephalic.     Right Ear: Tympanic membrane and external ear normal.     Left Ear: Tympanic membrane  and  external ear normal.     Nose: Nose normal.     Mouth/Throat:     Mouth: Mucous membranes are moist.  Eyes:     Conjunctiva/sclera: Conjunctivae normal.     Pupils: Pupils are equal, round, and reactive to light.  Neck:     Musculoskeletal: Normal range of motion and neck supple.  Cardiovascular:     Rate and Rhythm: Normal rate. Rhythm irregular.  Pulmonary:     Effort: Pulmonary effort is normal.     Breath sounds: Rhonchi present.  Musculoskeletal: Normal range of motion.  Skin:    General: Skin is warm and dry.  Neurological:     General: No focal deficit present.     Mental Status: He is alert.  Psychiatric:        Mood and Affect: Mood normal.        Behavior: Behavior normal.        Thought Content: Thought content normal.        Judgment: Judgment normal.      UC Treatments / Results  Labs (all labs ordered are listed, but only abnormal results are displayed) Labs Reviewed - No data to display  EKG None  Radiology Dg Chest 2 View  Result Date: 12/30/2017 CLINICAL DATA:  Cough and congestion. EXAM: CHEST - 2 VIEW COMPARISON:  April 29, 2017 FINDINGS: There is no appreciable edema or consolidation. Heart is upper normal in size with pulmonary vascularity normal. There is aortic atherosclerosis. No adenopathy. No bone lesions. IMPRESSION: Aortic atherosclerosis.  No edema or consolidation. Aortic Atherosclerosis (ICD10-I70.0). Electronically Signed   By: Lowella Grip III M.D.   On: 12/30/2017 20:04    Procedures Procedures (including critical care time)  Medications Ordered in UC Medications - No data to display  Initial Impression / Assessment and Plan / UC Course  I have reviewed the triage vital signs and the nursing notes.  Pertinent labs & imaging results that were available during my care of the patient were reviewed by me and considered in my medical decision making (see chart for details).     Final Clinical Impressions(s) / UC Diagnoses     Final diagnoses:  Acute bronchitis, unspecified organism   Discharge Instructions   None    ED Prescriptions    Medication Sig Dispense Auth. Provider   amoxicillin-clavulanate (AUGMENTIN) 875-125 MG tablet Take 1 tablet by mouth every 12 (twelve) hours. 14 tablet Robyn Haber, MD   HYDROcodone-homatropine (HYDROMET) 5-1.5 MG/5ML syrup Take 5 mLs by mouth every 6 (six) hours as needed for cough. 60 mL Robyn Haber, MD     Controlled Substance Prescriptions [place] Controlled Substance Registry consulted? Not Applicable   Robyn Haber, MD 12/30/17 2021

## 2017-12-30 NOTE — ED Triage Notes (Signed)
Pt presents to [facility] for assessment of fevers, chills, cough, congestion, a rib pain x 2 days.

## 2018-01-04 ENCOUNTER — Telehealth (INDEPENDENT_AMBULATORY_CARE_PROVIDER_SITE_OTHER): Payer: Self-pay | Admitting: *Deleted

## 2018-01-04 NOTE — Telephone Encounter (Signed)
Per [employer] [phone] and [phone] has been approved for Bil L2-L3 and L3-L4 RFA.   Service [phone] Auth Effective Date:12/31/2019Auth End Date:06/28/2020Initiated Date:12/31/2019Decision Date:12/31/2019Decision Type :InitialCase Status:Approved

## 2018-01-17 ENCOUNTER — Other Ambulatory Visit: Payer: Self-pay | Admitting: Cardiology

## 2018-01-17 MED ORDER — LOSARTAN POTASSIUM 25 MG PO TABS: 25.0000 mg | ORAL_TABLET | Freq: Every day | ORAL | 0 refills | Status: DC

## 2018-01-17 NOTE — Telephone Encounter (Signed)
° °  1. Which medications need to be refilled? (please list name of each medication and dose if known) Losartan potassium 25mg  tablet  2. Which pharmacy/location (including street and city if local pharmacy) is medication to be sent to? [id number]  3. Do they need a 30 day or 90 day supply? [place]

## 2018-01-17 NOTE — Telephone Encounter (Signed)
Losartan 25 mg daily refilled

## 2018-01-18 ENCOUNTER — Encounter: Payer: Self-pay | Admitting: Internal Medicine

## 2018-01-19 ENCOUNTER — Ambulatory Visit: Payer: Medicare HMO | Admitting: Internal Medicine

## 2018-01-19 ENCOUNTER — Encounter: Payer: Self-pay | Admitting: Internal Medicine

## 2018-01-19 VITALS — BP 118/80 | HR 75 | Ht 70.0 in | Wt 172.0 lb

## 2018-01-19 DIAGNOSIS — I480 Paroxysmal atrial fibrillation: Secondary | ICD-10-CM | POA: Diagnosis not present

## 2018-01-19 DIAGNOSIS — I10 Essential (primary) hypertension: Secondary | ICD-10-CM | POA: Diagnosis not present

## 2018-01-19 NOTE — Progress Notes (Signed)
PCP: [name], MD Primary Cardiologist: Dr Wynonia Lawman Primary EP: Dr Delena Serve is a 83 y.o. male who presents today for routine electrophysiology followup.  I saw him last in 2016.  Since last being seen in our clinic, the patient reports doing very well.  He enjoys retirement and is painting frequently.   Today, he denies symptoms of palpitations, chest pain, shortness of breath,  lower extremity edema, dizziness, presyncope, or syncope.  The patient is otherwise without complaint today.   Past Medical History:  Diagnosis Date  . Anxiety   . Atrial flutter ([place])    s/p afib and atrial flutter ablation 04/09/09  . Blood transfusion without reported diagnosis   . BPH (benign prostatic hyperplasia)   . Cancer ([place])    basal cell CA removed  . Cataract    removed with lens implants   . Colitis, ischemic ([place])    secondary to ebolism from afib 1/10  . Constipation   . GERD (gastroesophageal reflux disease)   . Gout   . HTN (hypertension)   . Hyperlipidemia   . Intracranial bleed ([place]) 04/10/10   Right occipital hematoma with a left homonymous hemianopsia   . Migraines   . Paroxysmal atrial fibrillation ([place])    s/p PVI 04/09/09 and 10/17/10  . Rosacea   . Stroke ([place]) 04/08/2010   hemorragic, anticoagulation stopped at that time  . Tuberculosis    s/p treatment 1964   Past Surgical History:  Procedure Laterality Date  . ablation  04/09/2009   s/p afib and atrial flutter ablation by JA  . ABLATION OF DYSRHYTHMIC FOCUS  10/15/09   repeat afib ablation by JA  . APPENDECTOMY    . CATARACT EXTRACTION, BILATERAL     with lens implants   . COLONOSCOPY    . CORNEAL LACERATION REPAIR    . INGUINAL HERNIA REPAIR    . POLYPECTOMY    . TONSILLECTOMY    . VASECTOMY      ROS- all systems are reviewed and negatives except as per HPI above  Current Outpatient Medications  Medication Sig Dispense Refill  . acetaminophen (TYLENOL) 325 MG tablet Take 650 mg by mouth every 6  (six) hours as needed.      Marland Kitchen allopurinol (ZYLOPRIM) 100 MG tablet Take 100 mg by mouth at bedtime.      Marland Kitchen amoxicillin-clavulanate (AUGMENTIN) 875-125 MG tablet Take 1 tablet by mouth every 12 (twelve) hours. 14 tablet 0  . Ascorbic Acid (VITAMIN C) 1000 MG tablet Take 1,000 mg by mouth daily.     Marland Kitchen aspirin 81 MG tablet Take 81 mg by mouth daily.      . diclofenac sodium (VOLTAREN) 1 % GEL APPLY 2-4 GRAMS 2-3 TIMES DAILY AS NEEDED FOR PAIN 100 g 0  . diltiazem (CARDIZEM CD) 240 MG 24 hr capsule Take 1 capsule (240 mg total) by mouth daily. 10 capsule 0  . fluorometholone (FML) 0.1 % ophthalmic suspension SHAKE LQ AND INT 1 GTT IN OU QID  0  . fluticasone (CUTIVATE) 0.05 % cream     . HYDROcodone-homatropine (HYDROMET) 5-1.5 MG/5ML syrup Take 5 mLs by mouth every 6 (six) hours as needed for cough. 60 mL 0  . ibuprofen (ADVIL,MOTRIN) 200 MG tablet Take 200 mg by mouth every 6 (six) hours as needed.    Marland Kitchen LORazepam (ATIVAN) 1 MG tablet TAKE 1/2-1 TABLET TWICE DAILY AS NEEDED  3  . losartan (COZAAR) 25 MG tablet Take 1 tablet (  25 mg total) by mouth daily. 30 tablet 0  . methocarbamol (ROBAXIN) 500 MG tablet Take 1 tablet (500 mg total) by mouth every 8 (eight) hours as needed for muscle spasms. 60 tablet 0  . NON FORMULARY Stool softener 100 mg... Take 1 capsule by mouth once daily.     . rosuvastatin (CRESTOR) 10 MG tablet Take 10 mg by mouth daily.  2   Current Facility-Administered Medications  Medication Dose Route Frequency Provider Last Rate Last Dose  . 0.9 %  sodium chloride infusion  500 mL Intravenous Once Irene Shipper, MD      . bupivacaine (MARCAINE) 0.5 % (with pres) injection 3 mL  3 mL Other Once Magnus Sinning, MD        Physical Exam: Vitals:   01/19/18 1054  BP: 118/80  Pulse: 75  SpO2: 99%  Weight: 172 lb (78 kg)  Height: 5\' 10"  (1.778 m)    GEN- The patient is well appearing, alert and oriented x 3 today.   Head- normocephalic, atraumatic Eyes-  Sclera clear,  conjunctiva pink Ears- hearing intact Oropharynx- clear Lungs- Clear to ausculation bilaterally, normal work of breathing Heart- irregular rate and rhythm, no murmurs, rubs or gallops, PMI not laterally displaced GI- soft, NT, ND, + BS Extremities- no clubbing, cyanosis, or edema  Wt Readings from Last 3 Encounters:  01/19/18 172 lb (78 kg)  12/24/17 175 lb 1.9 oz (79.4 kg)  11/25/17 179 lb 4.8 oz (81.3 kg)   Echo 12/30/17 is reviewed and reveals EF 60%, moderate MR  EKG tracing ordered today is personally reviewed and shows afib, V rate 75 bpm  Assessment and Plan:  1. Persistent atrial fibrillation He did very well post ablation for quite some time.  Now with return of afib without significant symptoms. chads2vasc score is 5 (including prior systemic embolism).  He had prior ICH on coumadin.  He is therefore not on anticoagulation.  He has done well with this approach.  We did discuss DOAC therapy as well as LAAO as an options today, which he declines at this time. Doing well with rate control No changes  2. HTN Stable No change required today  No changes today Return to see me in a year  [name] MD, [facility] 01/19/2018 11:05 AM

## 2018-01-19 NOTE — Patient Instructions (Signed)

## 2018-01-25 ENCOUNTER — Encounter (INDEPENDENT_AMBULATORY_CARE_PROVIDER_SITE_OTHER): Payer: Self-pay | Admitting: Physical Medicine and Rehabilitation

## 2018-01-25 ENCOUNTER — Ambulatory Visit (INDEPENDENT_AMBULATORY_CARE_PROVIDER_SITE_OTHER): Payer: Self-pay

## 2018-01-25 ENCOUNTER — Ambulatory Visit (INDEPENDENT_AMBULATORY_CARE_PROVIDER_SITE_OTHER): Payer: Medicare HMO | Admitting: Physical Medicine and Rehabilitation

## 2018-01-25 VITALS — BP 117/65 | HR 57 | Temp 97.6°F

## 2018-01-25 DIAGNOSIS — M47816 Spondylosis without myelopathy or radiculopathy, lumbar region: Secondary | ICD-10-CM

## 2018-01-25 MED ORDER — METHYLPREDNISOLONE ACETATE 80 MG/ML IJ SUSP
80.0000 mg | Freq: Once | INTRAMUSCULAR | Status: AC
  Administered 2018-01-25: 80 mg

## 2018-01-25 NOTE — Patient Instructions (Signed)

## 2018-01-25 NOTE — Progress Notes (Signed)
 .  Numeric Pain Rating Scale and Functional Assessment Average Pain 7   In the last MONTH (on 0-10 scale) has pain interfered with the following?  1. General activity like being  able to carry out your everyday physical activities such as walking, climbing stairs, carrying groceries, or moving a chair?  Rating(4)   +Driver, -BT, -Dye Allergies.  

## 2018-01-26 NOTE — Procedures (Signed)
Lumbar Facet Joint Nerve Denervation  Patient: Dean Pratt      Date of Birth: Jul 30, 1935 MRN: [id number] PCP: [name], MD      Visit Date: 01/25/2018   Universal Protocol:    Date/Time: 01/22/205:59 AM  Consent Given By: the patient  Position: PRONE  Additional Comments: Vital signs were monitored before and after the procedure. Patient was prepped and draped in the usual sterile fashion. The correct patient, procedure, and site was verified.   Injection Procedure Details:  Procedure Site One Meds Administered:  Meds ordered this encounter  Medications  . methylPREDNISolone acetate (DEPO-MEDROL) injection 80 mg     Laterality: Right  Location/Site:  L2-L3 L3-L4  Needle size: 18 G  Needle type: Radiofrequency cannula  Needle Placement: Along juncture of superior articular process and transverse pocess  Findings:  -Comments:  Procedure Details: For each desired target nerve, the corresponding transverse process (sacral ala for the L5 dorsal rami) was identified and the fluoroscope was positioned to square off the endplates of the corresponding vertebral body to achieve a true AP midline view.  The beam was then obliqued 15 to 20 degrees and caudally tilted 15 to 20 degrees to line up a trajectory along the target nerves. The skin over the target of the junction of superior articulating process and transverse process (sacral ala for the L5 dorsal rami) was infiltrated with 66ml of 1% Lidocaine without Epinephrine.  The 18 gauge 57mm active tip outer cannula was advanced in trajectory view to the target.  This procedure was repeated for each target nerve.  Then, for all levels, the outer cannula placement was fine-tuned and the position was then confirmed with bi-planar imaging.    Test stimulation was done both at sensory and motor levels to ensure there was no radicular stimulation. The target tissues were then infiltrated with 1 ml of 1% Lidocaine without  Epinephrine. Subsequently, a percutaneous neurotomy was carried out for 60 seconds at 80 degrees Celsius. The procedure was repeated with the cannula rotated 90 degrees, for duration of 60 seconds, one additional time at each level for a total of two lesions per level.  After the completion of the two lesions, 1 ml of injectate was delivered. It was then repeated for each facet joint nerve mentioned above. Appropriate radiographs were obtained to verify the probe placement during the neurotomy.   Additional Comments:  The patient tolerated the procedure well Dressing: Band-Aid    Post-procedure details: Patient was observed during the procedure. Post-procedure instructions were reviewed.  Patient left the clinic in stable condition.

## 2018-01-26 NOTE — Progress Notes (Signed)
Dean Pratt - 83 y.o. male MRN [id number]  Date of birth: 06-10-1935  Office Visit Note: Visit Date: 01/25/2018 PCP: [name], MD Referred by: [name], MD  Subjective: Chief Complaint  Patient presents with  . Lower Back - Pain   HPI: Dean Pratt is a 83 y.o. male who comes in today For planned right-sided radiofrequency ablation of the L2-3 and L3-4 facet joints.  Patient has chronic axial low back pain bilaterally right more than left.  Has failed conservative care including home exercises and physical therapy.  He has had medication management has had intermittent relief with injections over the years.  MRI evidence of facet arthropathy degenerative disc changes but no focal stenosis no leg pain.  Has pain on exam today and usually pain with facet joint loading.  He has had double diagnostic blocks in order to complete radiofrequency ablation for definitive treatment.  ROS Otherwise per HPI.  Assessment & Plan: Visit Diagnoses:  1. Spondylosis without myelopathy or radiculopathy, lumbar region     Plan: No additional findings.   Meds & Orders:  Meds ordered this encounter  Medications  . methylPREDNISolone acetate (DEPO-MEDROL) injection 80 mg    Orders Placed This Encounter  Procedures  . Radiofrequency,Lumbar  . XR C-ARM NO REPORT    Follow-up: Return if symptoms worsen or fail to improve.   Procedures: No procedures performed  Lumbar Facet Joint Nerve Denervation  Patient: Dean Pratt      Date of Birth: [birth year] MRN: [id number] PCP: [name], MD      Visit Date: 01/25/2018   Universal Protocol:    Date/Time: 01/22/205:59 AM  Consent Given By: the patient  Position: PRONE  Additional Comments: Vital signs were monitored before and after the procedure. Patient was prepped and draped in the usual sterile fashion. The correct patient, procedure, and site was verified.   Injection Procedure Details:  Procedure Site One Meds Administered:   Meds ordered this encounter  Medications  . methylPREDNISolone acetate (DEPO-MEDROL) injection 80 mg     Laterality: Right  Location/Site:  L2-L3 L3-L4  Needle size: 18 G  Needle type: Radiofrequency cannula  Needle Placement: Along juncture of superior articular process and transverse pocess  Findings:  -Comments:  Procedure Details: For each desired target nerve, the corresponding transverse process (sacral ala for the L5 dorsal rami) was identified and the fluoroscope was positioned to square off the endplates of the corresponding vertebral body to achieve a true AP midline view.  The beam was then obliqued 15 to 20 degrees and caudally tilted 15 to 20 degrees to line up a trajectory along the target nerves. The skin over the target of the junction of superior articulating process and transverse process (sacral ala for the L5 dorsal rami) was infiltrated with 39ml of 1% Lidocaine without Epinephrine.  The 18 gauge 80mm active tip outer cannula was advanced in trajectory view to the target.  This procedure was repeated for each target nerve.  Then, for all levels, the outer cannula placement was fine-tuned and the position was then confirmed with bi-planar imaging.    Test stimulation was done both at sensory and motor levels to ensure there was no radicular stimulation. The target tissues were then infiltrated with 1 ml of 1% Lidocaine without Epinephrine. Subsequently, a percutaneous neurotomy was carried out for 60 seconds at 80 degrees Celsius. The procedure was repeated with the cannula rotated 90 degrees, for duration of 60 seconds, one additional time at  each level for a total of two lesions per level.  After the completion of the two lesions, 1 ml of injectate was delivered. It was then repeated for each facet joint nerve mentioned above. Appropriate radiographs were obtained to verify the probe placement during the neurotomy.   Additional Comments:  The patient tolerated the  procedure well Dressing: Band-Aid    Post-procedure details: Patient was observed during the procedure. Post-procedure instructions were reviewed.  Patient left the clinic in stable condition.      Clinical History: MRI LUMBAR SPINE WITHOUT CONTRAST  TECHNIQUE: Multiplanar, multisequence MR imaging of the lumbar spine was performed. No intravenous contrast was administered.  COMPARISON:  Lumbar MRI 10/16/2010.  Abdominal CT 02/28/2008.  FINDINGS: Segmentation: Conventional anatomy assumed, with the last open disc space designated L5-S1.This numbering is concordant with previous imaging.  Alignment: There is a convex right scoliosis centered at L2. The lateral alignment is stable and near anatomic.  Vertebrae: No worrisome osseous lesion, acute fracture or pars defect. There are scattered endplate degenerative changes. The lumbar pedicles are somewhat short on a congenital basis. The visualized sacroiliac joints appear unremarkable.  Conus medullaris: Extends to the T12-L1 level and appears normal.  Paraspinal and other soft tissues: Right renal cysts are partially imaged.  Disc levels:  T12-L1: Chronic disc extrusion with cephalad extension has largely involuted. No mass effect on the distal cord or foraminal compromise.  L1-2: Chronic degenerative disc disease with loss of disc height, annular disc bulging eccentric to the left and circumferential endplate osteophytes. Mild facet hypertrophy. No significant spinal stenosis or nerve root encroachment.  L2-3: Mildly progressive loss of disc height with annular disc bulging and endplate osteophytes asymmetric to the left. Mild facet and ligamentous hypertrophy. There is mild asymmetric narrowing of the left lateral recess and left foramen which has mildly progressed.  L3-4: Stable mild loss of disc height with annular disc bulging and asymmetric facet hypertrophy on the right. Mild narrowing of  the right lateral recess and both foramina.  L4-5: Mildly progressive loss of disc height annular disc bulging and endplate osteophytes asymmetric to the right. Right extraforaminal disc extrusion has involuted with improved right foraminal narrowing, now moderate. There is facet and ligamentous hypertrophy contributing to mild narrowing of the lateral recesses.  L5-S1: Stable chronic degenerative disc disease with loss of disc height and annular disc bulging eccentric the right. Mild facet hypertrophy. Stable mild right-greater-than-left foraminal narrowing.  IMPRESSION: 1. Compared with the prior study from 2012, no acute findings are seen. 2. There is minimally progressive multilevel spondylosis with disc bulging and endplate osteophytes. These contribute to mild lateral recess and foraminal narrowing as described above. No acute findings or definite nerve root compression. Right extraforaminal disc extrusion at L4-5 has involuted and there is less right foraminal narrowing.   Electronically Signed   By: Richardean Sale M.D.   On: 06/12/2017 14:41   He reports that he has quit smoking. He has never used smokeless tobacco. No results for input(s): HGBA1C, LABURIC in the last 8760 hours.  Objective:  VS:  HT:    WT:   BMI:     BP:117/65  HR:(!) 57bpm  TEMP:97.6 F (36.4 C)(Oral)  RESP:  Physical Exam  Ortho Exam Imaging: Xr C-arm No Report  Result Date: 01/25/2018 Please see Notes tab for imaging impression.   Past Medical/Family/Surgical/Social History: Medications & Allergies reviewed per EMR, new medications updated. Patient Active Problem List   Diagnosis Date Noted  . Status post  ablation of atrial fibrillation 12/24/2017  . Polycythemia vera ([place]) 04/13/2017  . OTHER AND UNSPECIFIED COAGULATION DEFECTS 01/02/2010  . ATRIAL FIBRILLATION 01/02/2010  . SNORING 02/27/2009  . ISCHEMIC COLITIS 03/27/2008  . ABDOMINAL PAIN-RLQ 03/22/2008  . ABNORMAL  FINDINGS GI TRACT 03/22/2008  . PERSONAL HX COLONIC POLYPS 03/22/2008  . COLONIC POLYPS 03/21/2008  . Dyslipidemia 03/21/2008  . GOUT 03/21/2008  . ANXIETY 03/21/2008  . MIGRAINE HEADACHE 03/21/2008  . CONSTIPATION, CHRONIC 03/21/2008  . ROSACEA 03/21/2008  . Essential hypertension 03/21/2008  . CARCINOMA, BASAL CELL, HX OF 03/21/2008  . TUBERCULOSIS, HX OF 03/21/2008  . ATRIAL FIBRILLATION, HX OF 03/21/2008  . BENIGN PROSTATIC HYPERTROPHY, HX OF 03/21/2008   Past Medical History:  Diagnosis Date  . Anxiety   . Atrial flutter ([place])    s/p afib and atrial flutter ablation 04/09/09  . Blood transfusion without reported diagnosis   . BPH (benign prostatic hyperplasia)   . Cancer ([place])    basal cell CA removed  . Cataract    removed with lens implants   . Colitis, ischemic ([place])    secondary to ebolism from afib 1/10  . Constipation   . GERD (gastroesophageal reflux disease)   . Gout   . HTN (hypertension)   . Hyperlipidemia   . Intracranial bleed ([place]) 04/10/10   Right occipital hematoma with a left homonymous hemianopsia   . Migraines   . Paroxysmal atrial fibrillation ([place])    s/p PVI 04/09/09 and 10/17/10  . Rosacea   . Stroke ([place]) 04/08/2010   hemorragic, anticoagulation stopped at that time  . Tuberculosis    s/p treatment 1964   Family History  Problem Relation Age of Onset  . Liver cancer Father   . Prostate cancer Father   . Coronary artery disease Father   . COPD Mother   . Breast cancer Sister   . Colon cancer Neg Hx   . Rectal cancer Neg Hx   . Stomach cancer Neg Hx   . Esophageal cancer Neg Hx   . Colon polyps Neg Hx    Past Surgical History:  Procedure Laterality Date  . ablation  04/09/2009   s/p afib and atrial flutter ablation by [facility]  . ABLATION OF DYSRHYTHMIC FOCUS  10/15/09   repeat afib ablation by [facility]  . APPENDECTOMY    . CATARACT EXTRACTION, BILATERAL     with lens implants   . COLONOSCOPY    . CORNEAL LACERATION REPAIR    . INGUINAL HERNIA  REPAIR    . POLYPECTOMY    . TONSILLECTOMY    . VASECTOMY     Social History   Occupational History  . Not on file  Tobacco Use  . Smoking status: Former Research scientist (life sciences)  . Smokeless tobacco: Never Used  Substance and Sexual Activity  . Alcohol use: No  . Drug use: No  . Sexual activity: Not on file

## 2018-01-28 DIAGNOSIS — Z961 Presence of intraocular lens: Secondary | ICD-10-CM | POA: Diagnosis not present

## 2018-01-28 DIAGNOSIS — H10413 Chronic giant papillary conjunctivitis, bilateral: Secondary | ICD-10-CM | POA: Diagnosis not present

## 2018-01-28 DIAGNOSIS — H16223 Keratoconjunctivitis sicca, not specified as Sjogren's, bilateral: Secondary | ICD-10-CM | POA: Diagnosis not present

## 2018-02-01 ENCOUNTER — Ambulatory Visit: Payer: Medicare HMO | Admitting: Cardiology

## 2018-02-02 ENCOUNTER — Other Ambulatory Visit: Payer: Self-pay | Admitting: Cardiology

## 2018-02-02 MED ORDER — DILTIAZEM HCL ER COATED BEADS 240 MG PO CP24: 240.0000 mg | ORAL_CAPSULE | Freq: Every day | ORAL | 0 refills | Status: DC

## 2018-02-02 NOTE — Telephone Encounter (Signed)
30 day supply of diltiazem sent to [facility] in [place] with no refills. Patient needs to schedule a follow up appointment before further refills will be provided. Note attached to prescription for pharmacy to notify patient to call our office.

## 2018-02-02 NOTE — Telephone Encounter (Signed)
°  1. Which medications need to be refilled? (please list name of each medication and dose if known) Diltiazem 24hr ER 240mg   2. Which pharmacy/location (including street and city if local pharmacy) is medication to be sent to? [id number]  3. Do they need a 30 day or 90 day supply? [place]

## 2018-02-04 ENCOUNTER — Other Ambulatory Visit: Payer: Self-pay | Admitting: Cardiology

## 2018-02-04 MED ORDER — DILTIAZEM HCL ER COATED BEADS 240 MG PO CP24: 240.0000 mg | ORAL_CAPSULE | Freq: Every day | ORAL | 1 refills | Status: DC

## 2018-02-04 NOTE — Telephone Encounter (Signed)
° °  1. Which medications need to be refilled? (please list name of each medication and dose if known) Diltiazem 24hr ER 240mg  capsules once daily  2. Which pharmacy/location (including street and city if local pharmacy) is medication to be sent to?[id number]  3. Do they need a 30 day or 90 day supply? [place]

## 2018-02-04 NOTE — Telephone Encounter (Signed)
Refill provided

## 2018-02-07 ENCOUNTER — Encounter (INDEPENDENT_AMBULATORY_CARE_PROVIDER_SITE_OTHER): Payer: Self-pay | Admitting: Physical Medicine and Rehabilitation

## 2018-02-07 ENCOUNTER — Ambulatory Visit (INDEPENDENT_AMBULATORY_CARE_PROVIDER_SITE_OTHER): Payer: Self-pay

## 2018-02-07 ENCOUNTER — Ambulatory Visit (INDEPENDENT_AMBULATORY_CARE_PROVIDER_SITE_OTHER): Payer: Medicare HMO | Admitting: Physical Medicine and Rehabilitation

## 2018-02-07 VITALS — BP 128/75 | HR 63 | Temp 97.7°F

## 2018-02-07 DIAGNOSIS — M47816 Spondylosis without myelopathy or radiculopathy, lumbar region: Secondary | ICD-10-CM

## 2018-02-07 MED ORDER — METHYLPREDNISOLONE ACETATE 80 MG/ML IJ SUSP
80.0000 mg | Freq: Once | INTRAMUSCULAR | Status: AC
  Administered 2018-02-07: 80 mg

## 2018-02-07 NOTE — Progress Notes (Signed)
 .  Numeric Pain Rating Scale and Functional Assessment Average Pain 7   In the last MONTH (on 0-10 scale) has pain interfered with the following?  1. General activity like being  able to carry out your everyday physical activities such as walking, climbing stairs, carrying groceries, or moving a chair?  Rating(3)   +Driver, -BT, -Dye Allergies.  

## 2018-02-08 NOTE — Procedures (Signed)
Lumbar Facet Joint Nerve Denervation  Patient: Dean Pratt      Date of Birth: 10/02/1935 MRN: [id number] PCP: [name], MD      Visit Date: 02/07/2018   Universal Protocol:    Date/Time: 02/04/206:17 AM  Consent Given By: the patient  Position: PRONE  Additional Comments: Vital signs were monitored before and after the procedure. Patient was prepped and draped in the usual sterile fashion. The correct patient, procedure, and site was verified.   Injection Procedure Details:  Procedure Site One Meds Administered:  Meds ordered this encounter  Medications  . methylPREDNISolone acetate (DEPO-MEDROL) injection 80 mg     Laterality: Left  Location/Site:  L2-L3 L3-L4  Needle size: 18 G  Needle type: Radiofrequency cannula  Needle Placement: Along juncture of superior articular process and transverse pocess  Findings:  -Comments:  Procedure Details: For each desired target nerve, the corresponding transverse process (sacral ala for the L5 dorsal rami) was identified and the fluoroscope was positioned to square off the endplates of the corresponding vertebral body to achieve a true AP midline view.  The beam was then obliqued 15 to 20 degrees and caudally tilted 15 to 20 degrees to line up a trajectory along the target nerves. The skin over the target of the junction of superior articulating process and transverse process (sacral ala for the L5 dorsal rami) was infiltrated with 59ml of 1% Lidocaine without Epinephrine.  The 18 gauge 49mm active tip outer cannula was advanced in trajectory view to the target.  This procedure was repeated for each target nerve.  Then, for all levels, the outer cannula placement was fine-tuned and the position was then confirmed with bi-planar imaging.    Test stimulation was done both at sensory and motor levels to ensure there was no radicular stimulation. The target tissues were then infiltrated with 1 ml of 1% Lidocaine without  Epinephrine. Subsequently, a percutaneous neurotomy was carried out for 60 seconds at 80 degrees Celsius. The procedure was repeated with the cannula rotated 90 degrees, for duration of 60 seconds, one additional time at each level for a total of two lesions per level.  After the completion of the two lesions, 1 ml of injectate was delivered. It was then repeated for each facet joint nerve mentioned above. Appropriate radiographs were obtained to verify the probe placement during the neurotomy.   Additional Comments:  The patient tolerated the procedure well Dressing: Band-Aid    Post-procedure details: Patient was observed during the procedure. Post-procedure instructions were reviewed.  Patient left the clinic in stable condition.

## 2018-02-08 NOTE — Progress Notes (Signed)
Dean Pratt - 83 y.o. male MRN [id number]  Date of birth: 25-Jun-1935  Office Visit Note: Visit Date: 02/07/2018 PCP: [name], MD Referred by: [name], MD  Subjective: Chief Complaint  Patient presents with  . Lower Back - Pain   HPI:  Dean Pratt is a 83 y.o. male who comes in today For planned left sided radiofrequency ablation of the L2-3 and L3-4 facet joints.  Prior ablation on the right side is helped his lower back pain.  He had 1 instance on Friday where he felt like he tweaked his back and he had a lumbar referral pain in the hip.  Otherwise he has been doing well since the prior ablation.  We are going to complete the left side.  He is had more right than left-sided pain but bilateral pain.  He has had a history of lumbar scoliosis and lumbar facet arthropathy without stenosis or nerve compression.  He has had double diagnostic medial branch blocks with good relief.  We will see him back in 4 weeks or so depending on his relief.  ROS Otherwise per HPI.  Assessment & Plan: Visit Diagnoses:  1. Spondylosis without myelopathy or radiculopathy, lumbar region     Plan: No additional findings.   Meds & Orders:  Meds ordered this encounter  Medications  . methylPREDNISolone acetate (DEPO-MEDROL) injection 80 mg    Orders Placed This Encounter  Procedures  . Radiofrequency,Lumbar  . XR C-ARM NO REPORT    Follow-up: Return if symptoms worsen or fail to improve.   Procedures: No procedures performed  Lumbar Facet Joint Nerve Denervation  Patient: Dean Pratt      Date of Birth: Feb 14, 1935 MRN: [id number] PCP: [name], MD      Visit Date: 02/07/2018   Universal Protocol:    Date/Time: 02/04/206:17 AM  Consent Given By: the patient  Position: PRONE  Additional Comments: Vital signs were monitored before and after the procedure. Patient was prepped and draped in the usual sterile fashion. The correct patient, procedure, and site was  verified.   Injection Procedure Details:  Procedure Site One Meds Administered:  Meds ordered this encounter  Medications  . methylPREDNISolone acetate (DEPO-MEDROL) injection 80 mg     Laterality: Left  Location/Site:  L2-L3 L3-L4  Needle size: 18 G  Needle type: Radiofrequency cannula  Needle Placement: Along juncture of superior articular process and transverse pocess  Findings:  -Comments:  Procedure Details: For each desired target nerve, the corresponding transverse process (sacral ala for the L5 dorsal rami) was identified and the fluoroscope was positioned to square off the endplates of the corresponding vertebral body to achieve a true AP midline view.  The beam was then obliqued 15 to 20 degrees and caudally tilted 15 to 20 degrees to line up a trajectory along the target nerves. The skin over the target of the junction of superior articulating process and transverse process (sacral ala for the L5 dorsal rami) was infiltrated with 25ml of 1% Lidocaine without Epinephrine.  The 18 gauge 53mm active tip outer cannula was advanced in trajectory view to the target.  This procedure was repeated for each target nerve.  Then, for all levels, the outer cannula placement was fine-tuned and the position was then confirmed with bi-planar imaging.    Test stimulation was done both at sensory and motor levels to ensure there was no radicular stimulation. The target tissues were then infiltrated with 1 ml of 1% Lidocaine without Epinephrine.  Subsequently, a percutaneous neurotomy was carried out for 60 seconds at 80 degrees Celsius. The procedure was repeated with the cannula rotated 90 degrees, for duration of 60 seconds, one additional time at each level for a total of two lesions per level.  After the completion of the two lesions, 1 ml of injectate was delivered. It was then repeated for each facet joint nerve mentioned above. Appropriate radiographs were obtained to verify the probe  placement during the neurotomy.   Additional Comments:  The patient tolerated the procedure well Dressing: Band-Aid    Post-procedure details: Patient was observed during the procedure. Post-procedure instructions were reviewed.  Patient left the clinic in stable condition.      Clinical History: MRI LUMBAR SPINE WITHOUT CONTRAST  TECHNIQUE: Multiplanar, multisequence MR imaging of the lumbar spine was performed. No intravenous contrast was administered.  COMPARISON:  Lumbar MRI 10/16/2010.  Abdominal CT 02/28/2008.  FINDINGS: Segmentation: Conventional anatomy assumed, with the last open disc space designated L5-S1.This numbering is concordant with previous imaging.  Alignment: There is a convex right scoliosis centered at L2. The lateral alignment is stable and near anatomic.  Vertebrae: No worrisome osseous lesion, acute fracture or pars defect. There are scattered endplate degenerative changes. The lumbar pedicles are somewhat short on a congenital basis. The visualized sacroiliac joints appear unremarkable.  Conus medullaris: Extends to the T12-L1 level and appears normal.  Paraspinal and other soft tissues: Right renal cysts are partially imaged.  Disc levels:  T12-L1: Chronic disc extrusion with cephalad extension has largely involuted. No mass effect on the distal cord or foraminal compromise.  L1-2: Chronic degenerative disc disease with loss of disc height, annular disc bulging eccentric to the left and circumferential endplate osteophytes. Mild facet hypertrophy. No significant spinal stenosis or nerve root encroachment.  L2-3: Mildly progressive loss of disc height with annular disc bulging and endplate osteophytes asymmetric to the left. Mild facet and ligamentous hypertrophy. There is mild asymmetric narrowing of the left lateral recess and left foramen which has mildly progressed.  L3-4: Stable mild loss of disc height with  annular disc bulging and asymmetric facet hypertrophy on the right. Mild narrowing of the right lateral recess and both foramina.  L4-5: Mildly progressive loss of disc height annular disc bulging and endplate osteophytes asymmetric to the right. Right extraforaminal disc extrusion has involuted with improved right foraminal narrowing, now moderate. There is facet and ligamentous hypertrophy contributing to mild narrowing of the lateral recesses.  L5-S1: Stable chronic degenerative disc disease with loss of disc height and annular disc bulging eccentric the right. Mild facet hypertrophy. Stable mild right-greater-than-left foraminal narrowing.  IMPRESSION: 1. Compared with the prior study from 2012, no acute findings are seen. 2. There is minimally progressive multilevel spondylosis with disc bulging and endplate osteophytes. These contribute to mild lateral recess and foraminal narrowing as described above. No acute findings or definite nerve root compression. Right extraforaminal disc extrusion at L4-5 has involuted and there is less right foraminal narrowing.   Electronically Signed   By: Richardean Sale M.D.   On: 06/12/2017 14:41     Objective:  VS:  HT:    WT:   BMI:     BP:128/75  HR:63bpm  TEMP:97.7 F (36.5 C)(Oral)  RESP:  Physical Exam  Ortho Exam Imaging: Xr C-arm No Report  Result Date: 02/07/2018 Please see Notes tab for imaging impression.

## 2018-02-24 ENCOUNTER — Inpatient Hospital Stay: Payer: Medicare HMO

## 2018-02-24 ENCOUNTER — Telehealth: Payer: Self-pay | Admitting: Internal Medicine

## 2018-02-24 ENCOUNTER — Other Ambulatory Visit: Payer: Self-pay

## 2018-02-24 ENCOUNTER — Inpatient Hospital Stay: Payer: Medicare HMO | Attending: Internal Medicine

## 2018-02-24 ENCOUNTER — Inpatient Hospital Stay: Payer: Medicare HMO | Admitting: Internal Medicine

## 2018-02-24 ENCOUNTER — Encounter: Payer: Self-pay | Admitting: Internal Medicine

## 2018-02-24 VITALS — BP 136/76 | HR 66 | Temp 97.4°F | Resp 19 | Ht 70.0 in | Wt 168.9 lb

## 2018-02-24 DIAGNOSIS — E349 Endocrine disorder, unspecified: Secondary | ICD-10-CM

## 2018-02-24 DIAGNOSIS — E785 Hyperlipidemia, unspecified: Secondary | ICD-10-CM | POA: Diagnosis not present

## 2018-02-24 DIAGNOSIS — Z7982 Long term (current) use of aspirin: Secondary | ICD-10-CM | POA: Diagnosis not present

## 2018-02-24 DIAGNOSIS — K219 Gastro-esophageal reflux disease without esophagitis: Secondary | ICD-10-CM

## 2018-02-24 DIAGNOSIS — N400 Enlarged prostate without lower urinary tract symptoms: Secondary | ICD-10-CM

## 2018-02-24 DIAGNOSIS — Z791 Long term (current) use of non-steroidal anti-inflammatories (NSAID): Secondary | ICD-10-CM | POA: Insufficient documentation

## 2018-02-24 DIAGNOSIS — Z79890 Hormone replacement therapy (postmenopausal): Secondary | ICD-10-CM | POA: Diagnosis not present

## 2018-02-24 DIAGNOSIS — Z8673 Personal history of transient ischemic attack (TIA), and cerebral infarction without residual deficits: Secondary | ICD-10-CM

## 2018-02-24 DIAGNOSIS — Z79899 Other long term (current) drug therapy: Secondary | ICD-10-CM | POA: Diagnosis not present

## 2018-02-24 DIAGNOSIS — I10 Essential (primary) hypertension: Secondary | ICD-10-CM | POA: Diagnosis not present

## 2018-02-24 DIAGNOSIS — J00 Acute nasopharyngitis [common cold]: Secondary | ICD-10-CM | POA: Diagnosis not present

## 2018-02-24 DIAGNOSIS — D45 Polycythemia vera: Secondary | ICD-10-CM

## 2018-02-24 LAB — CMP (CANCER CENTER ONLY)
ALT: 23 U/L (ref 0–44)
AST: 14 U/L — AB (ref 15–41)
Albumin: 4 g/dL (ref 3.5–5.0)
Alkaline Phosphatase: 66 U/L (ref 38–126)
Anion gap: 11 (ref 5–15)
BUN: 16 mg/dL (ref 8–23)
CO2: 22 mmol/L (ref 22–32)
Calcium: 9.8 mg/dL (ref 8.9–10.3)
Chloride: 106 mmol/L (ref 98–111)
Creatinine: 0.96 mg/dL (ref 0.61–1.24)
GFR, Est AFR Am: >60 mL/min (ref >60)
GFR, Estimated: >60 mL/min (ref >60)
Glucose, Bld: 95 mg/dL (ref 70–99)
POTASSIUM: 4.3 mmol/L (ref 3.5–5.1)
Sodium: 139 mmol/L (ref 135–145)
Total Bilirubin: 0.7 mg/dL (ref 0.3–1.2)
Total Protein: 6.7 g/dL (ref 6.5–8.1)

## 2018-02-24 LAB — CBC WITH DIFFERENTIAL (CANCER CENTER ONLY)
Abs Immature Granulocytes: 0.12 10*3/uL — ABNORMAL HIGH (ref 0.00–0.07)
Basophils Absolute: 0.1 10*3/uL (ref 0.0–0.1)
Basophils Relative: 1 %
Eosinophils Absolute: 0.3 10*3/uL (ref 0.0–0.5)
Eosinophils Relative: 3 %
HCT: 53.7 % — ABNORMAL HIGH (ref 39.0–52.0)
Hemoglobin: 17.6 g/dL — ABNORMAL HIGH (ref 13.0–17.0)
Immature Granulocytes: 1 %
Lymphocytes Relative: 9 %
Lymphs Abs: 1.1 10*3/uL (ref 0.7–4.0)
MCH: 29.4 pg (ref 26.0–34.0)
MCHC: 32.8 g/dL (ref 30.0–36.0)
MCV: 89.8 fL (ref 80.0–100.0)
MONOS PCT: 8 %
Monocytes Absolute: 1 10*3/uL (ref 0.1–1.0)
Neutro Abs: 9.6 10*3/uL — ABNORMAL HIGH (ref 1.7–7.7)
Neutrophils Relative %: 78 %
Platelet Count: 352 10*3/uL (ref 150–400)
RBC: 5.98 MIL/uL — ABNORMAL HIGH (ref 4.22–5.81)
RDW: 15.3 % (ref 11.5–15.5)
WBC Count: 12.1 10*3/uL — ABNORMAL HIGH (ref 4.0–10.5)
nRBC: 0 % (ref 0.0–0.2)

## 2018-02-24 LAB — LACTATE DEHYDROGENASE: LDH: 155 U/L (ref 98–192)

## 2018-02-24 NOTE — Telephone Encounter (Signed)
Scheduled appt per 02/20 los.  Printed calendar and and avs.

## 2018-02-24 NOTE — Patient Instructions (Signed)

## 2018-02-24 NOTE — Progress Notes (Signed)
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    PRIOR THERAPY: None  CURRENT THERAPY: Phlebotomy on as-needed basis.  INTERVAL HISTORY: Dean Pratt 83 y.o. male returns to the clinic today for follow-up visit.  The patient is feeling fine today with no concerning complaints.  He was recovering from recent cold symptoms and still have mild congestion.  He denied having any chest pain, shortness of breath but has mild cough with no hemoptysis.  He has no nausea, vomiting, diarrhea or constipation.  He denied having any headache or visual changes.  He is here today for evaluation and repeat blood work.  MEDICAL HISTORY: Past Medical History:  Diagnosis Date  . Anxiety   . Atrial flutter ([place])    s/p afib and atrial flutter ablation 04/09/09  . Blood transfusion without reported diagnosis   . BPH (benign prostatic hyperplasia)   . Cancer ([place])    basal cell CA removed  . Cataract    removed with lens implants   . Colitis, ischemic ([place])    secondary to ebolism from afib 1/10  . Constipation   . GERD (gastroesophageal reflux disease)   . Gout   . HTN (hypertension)   . Hyperlipidemia   . Intracranial bleed ([place]) 04/10/10   Right occipital hematoma with a left homonymous hemianopsia   . Migraines   . Paroxysmal atrial fibrillation ([place])    s/p PVI 04/09/09 and 10/17/10  . Rosacea   . Stroke ([place]) 04/08/2010   hemorragic, anticoagulation stopped at that time  . Tuberculosis    s/p treatment 1964    ALLERGIES:  is allergic to ace inhibitors and warfarin and related.  MEDICATIONS:  Current Outpatient Medications  Medication Sig Dispense Refill  . acetaminophen (TYLENOL) 325 MG tablet Take 650 mg by mouth every 6 (six) hours as needed.      Marland Kitchen allopurinol (ZYLOPRIM) 100 MG tablet Take 100 mg by mouth at  bedtime.      . Ascorbic Acid (VITAMIN C) 1000 MG tablet Take 1,000 mg by mouth daily.     Marland Kitchen aspirin 81 MG tablet Take 81 mg by mouth daily.      . diclofenac sodium (VOLTAREN) 1 % GEL APPLY 2-4 GRAMS 2-3 TIMES DAILY AS NEEDED FOR PAIN 100 g 0  . diltiazem (CARDIZEM CD) 240 MG 24 hr capsule Take 1 capsule (240 mg total) by mouth daily. 90 capsule 1  . fluorometholone (FML) 0.1 % ophthalmic suspension SHAKE LQ AND INT 1 GTT IN OU QID  0  . ibuprofen (ADVIL,MOTRIN) 200 MG tablet Take 200 mg by mouth every 6 (six) hours as needed.    Marland Kitchen LORazepam (ATIVAN) 1 MG tablet TAKE 1/2-1 TABLET TWICE DAILY AS NEEDED  3  . losartan (COZAAR) 25 MG tablet Take 1 tablet (25 mg total) by mouth daily. 30 tablet 0  . NON FORMULARY Stool softener 100 mg... Take 1 capsule by mouth once daily.     . rosuvastatin (CRESTOR) 10 MG tablet Take 10 mg by mouth daily.  2   Current Facility-Administered Medications  Medication Dose Route Frequency Provider Last Rate Last Dose  . 0.9 %  sodium chloride infusion  500 mL Intravenous Once Irene Shipper, MD      . bupivacaine (MARCAINE) 0.5 % (with pres) injection 3 mL  3  mL Other Once Magnus Sinning, MD        SURGICAL HISTORY:  Past Surgical History:  Procedure Laterality Date  . ablation  04/09/2009   s/p afib and atrial flutter ablation by [facility]  . ABLATION OF DYSRHYTHMIC FOCUS  10/15/09   repeat afib ablation by [facility]  . APPENDECTOMY    . CATARACT EXTRACTION, BILATERAL     with lens implants   . COLONOSCOPY    . CORNEAL LACERATION REPAIR    . INGUINAL HERNIA REPAIR    . POLYPECTOMY    . TONSILLECTOMY    . VASECTOMY      REVIEW OF SYSTEMS:  A comprehensive review of systems was negative except for: Respiratory: positive for cough   PHYSICAL EXAMINATION: General appearance: alert, cooperative and no distress Head: Normocephalic, without obvious abnormality, atraumatic Neck: no adenopathy, no JVD, supple, symmetrical, trachea midline and thyroid not enlarged,  symmetric, no tenderness/mass/nodules Lymph nodes: Cervical, supraclavicular, and axillary nodes normal. Resp: clear to auscultation bilaterally Back: symmetric, no curvature. ROM normal. No CVA tenderness. Cardio: regular rate and rhythm, S1, S2 normal, no murmur, click, rub or gallop GI: soft, non-tender; bowel sounds normal; no masses,  no organomegaly Extremities: extremities normal, atraumatic, no cyanosis or edema  ECOG PERFORMANCE STATUS: 1 - Symptomatic but completely ambulatory  Blood pressure 136/76, pulse 66, temperature (!) 97.4 F (36.3 C), temperature source Oral, resp. rate 19, height 5\' 10"  (1.778 m), weight 168 lb 14.4 oz (76.6 kg), SpO2 98 %.  LABORATORY DATA: Lab Results  Component Value Date   WBC 12.1 (H) 02/24/2018   HGB 17.6 (H) 02/24/2018   HCT 53.7 (H) 02/24/2018   MCV 89.8 02/24/2018   PLT 352 02/24/2018      Chemistry      Component Value Date/Time   NA 143 11/25/2017 0752   K 4.6 11/25/2017 0752   CL 107 11/25/2017 0752   CO2 24 11/25/2017 0752   BUN 12 11/25/2017 0752   CREATININE 1.12 11/25/2017 0752      Component Value Date/Time   CALCIUM 9.6 11/25/2017 0752   ALKPHOS 63 11/25/2017 0752   AST 17 11/25/2017 0752   ALT 16 11/25/2017 0752   BILITOT 0.7 11/25/2017 0752       RADIOGRAPHIC STUDIES: Xr C-arm No Report  Result Date: 02/07/2018 Please see Notes tab for imaging impression.  Xr C-arm No Report  Result Date: 01/25/2018 Please see Notes tab for imaging impression.   ASSESSMENT AND PLAN: This is a very pleasant 83 years old white male with recently diagnosed polycythemia vera with positive Jak 2 mutation.  The patient is also currently on hormonal therapy for androgen deficiency. The patient is feeling fine today with no concerning complaints. CBC today showed elevated hemoglobin and hematocrit. I will arrange for the patient to have phlebotomy done today. I will see him back for follow-up visit in 3 months for evaluation with  repeat blood work and phlebotomy if needed. He was advised to call immediately if he has any concerning symptoms in the interval. The patient voices understanding of current disease status and treatment options and is in agreement with the current care plan. All questions were answered. The patient knows to call the clinic with any problems, questions or concerns. We can certainly see the patient much sooner if necessary.  I spent 10 minutes counseling the patient face to face. The total time spent in the appointment was 15 minutes.  Disclaimer: This note was dictated with voice recognition  software. Similar sounding words can inadvertently be transcribed and may not be corrected upon review.

## 2018-02-24 NOTE — Procedures (Signed)
Lumbar Diagnostic Facet Joint Nerve Block with Fluoroscopic Guidance   Patient: Dean Pratt      Date of Birth: 1935-09-17 MRN: [id number] PCP: [name], MD      Visit Date: 12/23/2017   Universal Protocol:    Date/Time: 02/20/205:59 AM  Consent Given By: the patient  Position: PRONE  Additional Comments: Vital signs were monitored before and after the procedure. Patient was prepped and draped in the usual sterile fashion. The correct patient, procedure, and site was verified.   Injection Procedure Details:  Procedure Site One Meds Administered:  Meds ordered this encounter  Medications  . bupivacaine (MARCAINE) 0.5 % (with pres) injection 3 mL  . methylPREDNISolone acetate (DEPO-MEDROL) injection 80 mg     Laterality: Bilateral  Location/Site:  L2-L3 L3-L4  Needle size: 22 ga.  Needle type:spinal  Needle Placement: Oblique pedical  Findings:   -Comments: There was excellent flow of contrast along the articular pillars without intravascular flow.  Procedure Details: The fluoroscope beam is vertically oriented in AP and then obliqued 15 to 20 degrees to the ipsilateral side of the desired nerve to achieve the "Scotty dog" appearance.  The skin over the target area of the junction of the superior articulating process and the transverse process (sacral ala if blocking the L5 dorsal rami) was locally anesthetized with a 1 ml volume of 1% Lidocaine without Epinephrine.  The spinal needle was inserted and advanced in a trajectory view down to the target.   After contact with periosteum and negative aspirate for blood and CSF, correct placement without intravascular or epidural spread was confirmed by injecting 0.5 ml. of Isovue-250.  A spot radiograph was obtained of this image.    Next, a 0.5 ml. volume of the injectate described above was injected. The needle was then redirected to the other facet joint nerves mentioned above if needed.  Prior to the procedure, the  patient was given a Pain Diary which was completed for baseline measurements.  After the procedure, the patient rated their pain every 30 minutes and will continue rating at this frequency for a total of 5 hours.  The patient has been asked to complete the Diary and return to [place] by mail, fax or hand delivered as soon as possible.   Additional Comments:  The patient tolerated the procedure well Dressing: 2 x 2 sterile gauze and Band-Aid    Post-procedure details: Patient was observed during the procedure. Post-procedure instructions were reviewed.  Patient left the clinic in stable condition.

## 2018-02-24 NOTE — Progress Notes (Signed)
Dean Pratt presents today for phlebotomy per MD orders. LAC accessed with 18G and secondary set by Sharyn Dross. Phlebotomy procedure started at 10:01 and ended at 10:11. 511 grams removed. IV needle removed intact. Patient tolerated procedure well. Patient observed for 30 minutes after procedure with drink/snack provided. VSS and pt ambulated out of clinic without incident.

## 2018-02-25 ENCOUNTER — Other Ambulatory Visit: Payer: Self-pay | Admitting: Cardiology

## 2018-02-28 DIAGNOSIS — L719 Rosacea, unspecified: Secondary | ICD-10-CM | POA: Diagnosis not present

## 2018-02-28 DIAGNOSIS — H04123 Dry eye syndrome of bilateral lacrimal glands: Secondary | ICD-10-CM | POA: Diagnosis not present

## 2018-02-28 DIAGNOSIS — H1045 Other chronic allergic conjunctivitis: Secondary | ICD-10-CM | POA: Diagnosis not present

## 2018-02-28 DIAGNOSIS — Z961 Presence of intraocular lens: Secondary | ICD-10-CM | POA: Diagnosis not present

## 2018-03-08 DIAGNOSIS — Z125 Encounter for screening for malignant neoplasm of prostate: Secondary | ICD-10-CM | POA: Diagnosis not present

## 2018-03-08 DIAGNOSIS — E7849 Other hyperlipidemia: Secondary | ICD-10-CM | POA: Diagnosis not present

## 2018-03-08 DIAGNOSIS — I10 Essential (primary) hypertension: Secondary | ICD-10-CM | POA: Diagnosis not present

## 2018-03-08 DIAGNOSIS — R82998 Other abnormal findings in urine: Secondary | ICD-10-CM | POA: Diagnosis not present

## 2018-03-08 DIAGNOSIS — M109 Gout, unspecified: Secondary | ICD-10-CM | POA: Diagnosis not present

## 2018-03-08 DIAGNOSIS — E038 Other specified hypothyroidism: Secondary | ICD-10-CM | POA: Diagnosis not present

## 2018-03-15 DIAGNOSIS — D692 Other nonthrombocytopenic purpura: Secondary | ICD-10-CM | POA: Diagnosis not present

## 2018-03-15 DIAGNOSIS — H40009 Preglaucoma, unspecified, unspecified eye: Secondary | ICD-10-CM | POA: Diagnosis not present

## 2018-03-15 DIAGNOSIS — I129 Hypertensive chronic kidney disease with stage 1 through stage 4 chronic kidney disease, or unspecified chronic kidney disease: Secondary | ICD-10-CM | POA: Diagnosis not present

## 2018-03-15 DIAGNOSIS — D751 Secondary polycythemia: Secondary | ICD-10-CM | POA: Diagnosis not present

## 2018-03-15 DIAGNOSIS — N5082 Scrotal pain: Secondary | ICD-10-CM | POA: Diagnosis not present

## 2018-03-15 DIAGNOSIS — R809 Proteinuria, unspecified: Secondary | ICD-10-CM | POA: Diagnosis not present

## 2018-03-15 DIAGNOSIS — I480 Paroxysmal atrial fibrillation: Secondary | ICD-10-CM | POA: Diagnosis not present

## 2018-03-15 DIAGNOSIS — E291 Testicular hypofunction: Secondary | ICD-10-CM | POA: Diagnosis not present

## 2018-03-15 DIAGNOSIS — N182 Chronic kidney disease, stage 2 (mild): Secondary | ICD-10-CM | POA: Diagnosis not present

## 2018-03-15 DIAGNOSIS — Z1212 Encounter for screening for malignant neoplasm of rectum: Secondary | ICD-10-CM | POA: Diagnosis not present

## 2018-03-15 DIAGNOSIS — Z0000 Encounter for general adult medical examination without abnormal findings: Secondary | ICD-10-CM | POA: Diagnosis not present

## 2018-03-22 ENCOUNTER — Other Ambulatory Visit: Payer: Self-pay

## 2018-03-22 MED ORDER — LOSARTAN POTASSIUM 25 MG PO TABS: 25.0000 mg | ORAL_TABLET | Freq: Every day | ORAL | 2 refills | Status: DC

## 2018-03-24 ENCOUNTER — Other Ambulatory Visit: Payer: Self-pay

## 2018-03-24 MED ORDER — LOSARTAN POTASSIUM 25 MG PO TABS: 25.0000 mg | ORAL_TABLET | Freq: Every day | ORAL | 3 refills | Status: DC

## 2018-03-24 NOTE — Progress Notes (Signed)
Reordered losartan per Pt request.  Pt to follow with Dr. Rayann Heman.

## 2018-03-28 ENCOUNTER — Telehealth: Payer: Self-pay | Admitting: *Deleted

## 2018-03-28 NOTE — Telephone Encounter (Signed)
Let pt know that Dr. Rayann Heman confirmed that he would be taking care of him and that he doesn't need to come to our office to see a cardiologist. Dr. Bonita Quin nurse phoned in pt's refill for Losartan also. Pt thanked me and was glad I called him back about this matter.

## 2018-04-29 ENCOUNTER — Ambulatory Visit (INDEPENDENT_AMBULATORY_CARE_PROVIDER_SITE_OTHER): Payer: Medicare HMO | Admitting: Ophthalmology

## 2018-04-29 ENCOUNTER — Other Ambulatory Visit: Payer: Self-pay

## 2018-04-29 ENCOUNTER — Encounter (INDEPENDENT_AMBULATORY_CARE_PROVIDER_SITE_OTHER): Payer: Self-pay | Admitting: Ophthalmology

## 2018-04-29 DIAGNOSIS — H1045 Other chronic allergic conjunctivitis: Secondary | ICD-10-CM | POA: Diagnosis not present

## 2018-04-29 DIAGNOSIS — H35033 Hypertensive retinopathy, bilateral: Secondary | ICD-10-CM | POA: Diagnosis not present

## 2018-04-29 DIAGNOSIS — L719 Rosacea, unspecified: Secondary | ICD-10-CM | POA: Diagnosis not present

## 2018-04-29 DIAGNOSIS — Z961 Presence of intraocular lens: Secondary | ICD-10-CM | POA: Diagnosis not present

## 2018-04-29 DIAGNOSIS — H3321 Serous retinal detachment, right eye: Secondary | ICD-10-CM | POA: Diagnosis not present

## 2018-04-29 DIAGNOSIS — H33311 Horseshoe tear of retina without detachment, right eye: Secondary | ICD-10-CM

## 2018-04-29 DIAGNOSIS — I10 Essential (primary) hypertension: Secondary | ICD-10-CM | POA: Diagnosis not present

## 2018-04-29 DIAGNOSIS — H33011 Retinal detachment with single break, right eye: Secondary | ICD-10-CM | POA: Diagnosis not present

## 2018-04-29 DIAGNOSIS — H04123 Dry eye syndrome of bilateral lacrimal glands: Secondary | ICD-10-CM | POA: Diagnosis not present

## 2018-04-29 DIAGNOSIS — H1013 Acute atopic conjunctivitis, bilateral: Secondary | ICD-10-CM

## 2018-04-29 DIAGNOSIS — H3581 Retinal edema: Secondary | ICD-10-CM | POA: Diagnosis not present

## 2018-04-29 MED ORDER — PREDNISOLONE ACETATE 1 % OP SUSP: 1.0000 [drp] | Freq: Four times a day (QID) | OPHTHALMIC | 0 refills | Status: AC

## 2018-04-29 NOTE — Progress Notes (Signed)
[facility] Note  04/29/2018     CHIEF COMPLAINT Patient presents for Retina Evaluation   HISTORY OF PRESENT ILLNESS: Dean Pratt is a 83 y.o. male who presents to the clinic today for:   HPI    Retina Evaluation    In both eyes.  Associated Symptoms Floaters.  Negative for Flashes, Pain, Trauma, Fever, Weight Loss, Scalp Tenderness, Redness, Distortion, Photophobia, Jaw Claudication, Fatigue, Shoulder/Hip pain, Glare and Blind Spot.  Context:  distance vision, mid-range vision and near vision.  Treatments tried include surgery.  Response to treatment was mild improvement.  I, the attending physician,  performed the HPI with the patient and updated documentation appropriately.          Comments    Referral of Dr. Katy Fitch for retina eval.Patient states appx a week ago he "felt as if something was in his right eye",as time went his vision became worse, denies flashes and ocular pain, pt reports he had cataract sx ou 2-3 yrs ago since then he has had floaters. Pt also had laser retinopexy OD 6-7 yrs ago @[facility] . Pt is using Restastis and Refresh gtt's PRN       Last edited by Bernarda Caffey, MD on 04/29/2018 12:44 PM. (History)    pt states he went to see Dr. Katy Fitch for routine eye exam, he states Dr. Katy Fitch saw a tear in his right eye and sent him here, he states he never noticed any flashes or floaters  Referring physician: Debbra Riding, MD [address] [place], [place] [address]  HISTORICAL INFORMATION:   Selected notes from the MEDICAL RECORD NUMBER Referred by Dr. Wyatt Portela for concern of retinal tear LEE: 04.24.20 (S. Groat) [BCVA: OD: OS:] Ocular Hx- PMH-    CURRENT MEDICATIONS: Current Outpatient Medications (Ophthalmic Drugs)  Medication Sig  . fluorometholone (FML) 0.1 % ophthalmic suspension SHAKE LQ AND INT 1 GTT IN OU QID  . prednisoLONE acetate (PRED FORTE) 1 % ophthalmic suspension Place 1 drop into the right eye 4 (four)  times daily for 7 days.   No current facility-administered medications for this visit.  (Ophthalmic Drugs)   Current Outpatient Medications (Other)  Medication Sig  . acetaminophen (TYLENOL) 325 MG tablet Take 650 mg by mouth every 6 (six) hours as needed.    Marland Kitchen allopurinol (ZYLOPRIM) 100 MG tablet Take 100 mg by mouth at bedtime.    . Ascorbic Acid (VITAMIN C) 1000 MG tablet Take 1,000 mg by mouth daily.   Marland Kitchen aspirin 81 MG tablet Take 81 mg by mouth daily.    . diclofenac sodium (VOLTAREN) 1 % GEL APPLY 2-4 GRAMS 2-3 TIMES DAILY AS NEEDED FOR PAIN  . diltiazem (CARDIZEM CD) 240 MG 24 hr capsule TAKE 1 CAPSULE BY MOUTH EVERY DAY  . ibuprofen (ADVIL,MOTRIN) 200 MG tablet Take 200 mg by mouth every 6 (six) hours as needed.  Marland Kitchen LORazepam (ATIVAN) 1 MG tablet TAKE 1/2-1 TABLET TWICE DAILY AS NEEDED  . losartan (COZAAR) 25 MG tablet Take 1 tablet (25 mg total) by mouth daily.  . NON FORMULARY Stool softener 100 mg... Take 1 capsule by mouth once daily.   . rosuvastatin (CRESTOR) 10 MG tablet Take 10 mg by mouth daily.   Current Facility-Administered Medications (Other)  Medication Route  . 0.9 %  sodium chloride infusion Intravenous  . bupivacaine (MARCAINE) 0.5 % (with pres) injection 3 mL Other      REVIEW OF SYSTEMS: ROS  Positive for: Eyes   Negative for: Constitutional, Gastrointestinal, Neurological, Skin, Genitourinary, Musculoskeletal, HENT, Endocrine, Cardiovascular, Respiratory, Psychiatric, Allergic/Imm, Heme/Lymph   Last edited by Zenovia Jordan, LPN on 8/78/6767 20:94 AM. (History)       ALLERGIES Allergies  Allergen Reactions  . Ace Inhibitors Cough  . Warfarin And Related     Intercranial bleed    PAST MEDICAL HISTORY Past Medical History:  Diagnosis Date  . Anxiety   . Atrial flutter ([place])    s/p afib and atrial flutter ablation 04/09/09  . Blood transfusion without reported diagnosis   . BPH (benign prostatic hyperplasia)   . Cancer ([place])    basal cell  CA removed  . Cataract    removed with lens implants   . Colitis, ischemic ([place])    secondary to ebolism from afib 1/10  . Constipation   . GERD (gastroesophageal reflux disease)   . Gout   . HTN (hypertension)   . Hyperlipidemia   . Intracranial bleed ([place]) 04/10/10   Right occipital hematoma with a left homonymous hemianopsia   . Migraines   . Paroxysmal atrial fibrillation ([place])    s/p PVI 04/09/09 and 10/17/10  . Rosacea   . Stroke ([place]) 04/08/2010   hemorragic, anticoagulation stopped at that time  . Tuberculosis    s/p treatment 1964   Past Surgical History:  Procedure Laterality Date  . ablation  04/09/2009   s/p afib and atrial flutter ablation by [facility]  . ABLATION OF DYSRHYTHMIC FOCUS  10/15/09   repeat afib ablation by [facility]  . APPENDECTOMY    . CATARACT EXTRACTION    . CATARACT EXTRACTION, BILATERAL     with lens implants   . COLONOSCOPY    . CORNEAL LACERATION REPAIR    . EYE SURGERY    . INGUINAL HERNIA REPAIR    . POLYPECTOMY    . TONSILLECTOMY    . VASECTOMY      FAMILY HISTORY Family History  Problem Relation Age of Onset  . Liver cancer Father   . Prostate cancer Father   . Coronary artery disease Father   . COPD Mother   . Breast cancer Sister   . Colon cancer Neg Hx   . Rectal cancer Neg Hx   . Stomach cancer Neg Hx   . Esophageal cancer Neg Hx   . Colon polyps Neg Hx     SOCIAL HISTORY Social History   Tobacco Use  . Smoking status: Former Research scientist (life sciences)  . Smokeless tobacco: Never Used  Substance Use Topics  . Alcohol use: No  . Drug use: No         OPHTHALMIC EXAM:  Base Eye Exam    Visual Acuity (Snellen - Linear)      Right Left   Dist cc 20/25 +2 20/30 +2   Dist ph cc NI 20/25 -2   Correction:  Glasses       Tonometry (Tonopen, 11:27 AM)      Right Left   Pressure 16 12       Pupils      Dark Shape React APD   Right 6 Round Brisk None   Left 6 Round Brisk None  PT Dilated this am by DR. Graot       Visual Fields (Counting  fingers)      Left Right    Full Full       Extraocular Movement      Right Left    Full Full  Neuro/Psych    Oriented x3:  Yes       Dilation    Both eyes:  1.0% Mydriacyl, 2.5% Phenylephrine @ 11:23 AM        Slit Lamp and Fundus Exam    Slit Lamp Exam      Right Left   Lids/Lashes Dermatochalasis - upper lid, mild Meibomian gland dysfunction, mild Ptosis Dermatochalasis - upper lid, mild Meibomian gland dysfunction, mild Ptosis   Conjunctiva/Sclera mild temporal Pinguecula mild temporal Pinguecula   Cornea 1-2+ Punctate epithelial erosions, Debris in tear film 1-+ Punctate epithelial erosions, mild EBMD   Anterior Chamber deep and clear deep and clear   Iris Round and dilated Round and dilated   Lens PC IOL in good position with open PC PC IOL in good position with open PC   Vitreous Vitreous syneresis, trace pigment in anterior vitreous Vitreous syneresis       Fundus Exam      Right Left   Disc Pink and Sharp Pink and Sharp   C/D Ratio 0.3 0.2   Macula Flat, Blunted foveal reflex, Retinal pigment epithelial mottling Good foveal reflex, Retinal pigment epithelial mottling   Vessels Vascular attenuation, mild Tortuous Vascular attenuation   Periphery retinal tear at 1030 with surrounidng IRF/pigment; remaining retina attached with scattered reticular and peripheral drusen Attached, scattered reticular and peripheral drusen          IMAGING AND PROCEDURES  Imaging and Procedures for @TODAY @  OCT, Retina - OU - Both Eyes       Right Eye Quality was good. Central Foveal Thickness: 280. Progression has no prior data. Findings include normal foveal contour, no IRF, no SRF.   Left Eye Quality was good. Central Foveal Thickness: 274. Progression has no prior data. Findings include normal foveal contour, no SRF, no IRF.   Notes *Images captured and stored on drive  Diagnosis / Impression:  NFP; no IRF/SRF OU  Clinical management:   See  below  Abbreviations: NFP - Normal foveal profile. CME - cystoid macular edema. PED - pigment epithelial detachment. IRF - intraretinal fluid. SRF - subretinal fluid. EZ - ellipsoid zone. ERM - epiretinal membrane. ORA - outer retinal atrophy. ORT - outer retinal tubulation. SRHM - subretinal hyper-reflective material        Color Fundus Photography Optos - OU - Both Eyes       Right Eye Progression has no prior data. Disc findings include normal observations. Macula : flat, retinal pigment epithelium abnormalities. Vessels : attenuated. Periphery : RPE abnormality, detachment, tear (Retinal tear at 1030 with surrounding SRF and pigmented demarcation line).   Left Eye Progression has no prior data. Disc findings include normal observations. Macula : flat, retinal pigment epithelium abnormalities. Vessels : attenuated. Periphery : RPE abnormality (attached).   Notes Images stored on drive;   Impression: OD: focal retinal tear with surrounding detachment at 1030; +pigmented demarcation line--incomplete         Repair Retinal Detach, Photocoag - OD - Right Eye       LASER PROCEDURE NOTE  Procedure:  Barrier laser retinopexy using slit lamp laser, RIGHT eye   Diagnosis:   Retinal tear w/ surrounding SRF / focal RD, RIGHT eye                     Irregular flap tear with surrounding SRF and incomplete pigmented demarcation line at 1030 o'clock anterior to equator   Surgeon: Bernarda Caffey, MD, PhD  Anesthesia: Topical  Informed consent obtained, operative eye marked, and time out performed prior to initiation of laser.   Laser settings:  Lumenis Smart532 laser, slit lamp Lens: [place] PRP 165 Power: 280 mW Spot size: 200 microns Duration: 30 msec  # spots: 347  Placement of laser: Using a Mainster PRP 165 contact lens at the slit lamp, laser was placed in three confluent rows posterior to flap tear and surrounding detachment at 1030 oclock anterior to equator. Laser  indirect ophthalmoscopy was used to complete the anterior laser: 537 spots, 340 mW power, 100 ms duration.  Complications: None.  Patient tolerated the procedure well and received written and verbal post-procedure care information/education.                  ASSESSMENT/PLAN:    ICD-10-CM   1. Retinal tear of right eye H33.311 Color Fundus Photography Optos - OU - Both Eyes    Repair Retinal Detach, Photocoag - OD - Right Eye    CANCELED: Repair Retinal Breaks, Laser - OD - Right Eye    CANCELED: Color Fundus Photography Optos - OU - Both Eyes  2. Right retinal detachment H33.21 Color Fundus Photography Optos - OU - Both Eyes    Repair Retinal Detach, Photocoag - OD - Right Eye  3. Retinal edema H35.81 OCT, Retina - OU - Both Eyes  4. Essential hypertension I10   5. Hypertensive retinopathy of both eyes H35.033   6. Pseudophakia of both eyes Z96.1   7. Allergic conjunctivitis of both eyes H10.13     1-3. Retinal tear with surrounding SRF and focal RD, OD     - The incidence, risk factors, and natural history of retinal tear was discussed with patient.    - Potential treatment options including laser retinopexy and cryotherapy discussed with patient.  - tear located at 1030 with surrounding SRF and incomplete pigmented demarcation line  - recommend laser retinopexy OD  - RBA of procedure discussed, questions answered  - informed consent obtained and signed  - see procedure note  - start PF QID x7 days  - f/u in 2 wks  4,5. Hypertensive retinopathy OU  - discussed importance of tight BP control  - monitor  6. Pseudophakia OU  - s/p CE/IOL  - beautiful surgery, doing well  - Dr. Clent Jacks (2016/2017)  - monitor  7. Allergic Conjunctivitis OU  - stable  - management per [place] Ordered this visit:  Meds ordered this encounter  Medications  . prednisoLONE acetate (PRED FORTE) 1 % ophthalmic suspension    Sig: Place 1 drop into the  right eye 4 (four) times daily for 7 days.    Dispense:  10 mL    Refill:  0       Return in about 2 weeks (around 05/13/2018) for POV, retinal tear OD.  There are no Patient Instructions on file for this visit.   Explained the diagnoses, plan, and follow up with the patient and they expressed understanding.  Patient expressed understanding of the importance of proper follow up care.   This document serves as a record of services personally performed by Gardiner Sleeper, MD, PhD. It was created on their behalf by Ernest Mallick, OA, an ophthalmic assistant. The creation of this record is the provider's dictation and/or activities during the visit.    Electronically signed by: Ernest Mallick, OA  04.24.2020 12:44 PM    Gardiner Sleeper, M.D., Ph.D. Diseases & Surgery  of the Retina and Vitreous [facility]  I have reviewed the above documentation for accuracy and completeness, and I agree with the above. Gardiner Sleeper, M.D., Ph.D. 04/29/18 12:46 PM    Abbreviations: M myopia (nearsighted); A astigmatism; H hyperopia (farsighted); P presbyopia; Mrx spectacle prescription;  CTL contact lenses; OD right eye; OS left eye; OU both eyes  XT exotropia; ET esotropia; PEK punctate epithelial keratitis; PEE punctate epithelial erosions; DES dry eye syndrome; MGD meibomian gland dysfunction; ATs artificial tears; PFAT's preservative free artificial tears; [place] nuclear sclerotic cataract; PSC posterior subcapsular cataract; ERM epi-retinal membrane; PVD posterior vitreous detachment; RD retinal detachment; DM diabetes mellitus; DR diabetic retinopathy; NPDR non-proliferative diabetic retinopathy; PDR proliferative diabetic retinopathy; CSME clinically significant macular edema; DME diabetic macular edema; dbh dot blot hemorrhages; CWS cotton wool spot; POAG primary open angle glaucoma; C/D cup-to-disc ratio; HVF humphrey visual field; GVF goldmann visual field; OCT optical coherence  tomography; IOP intraocular pressure; BRVO Branch retinal vein occlusion; CRVO central retinal vein occlusion; CRAO central retinal artery occlusion; BRAO branch retinal artery occlusion; RT retinal tear; SB scleral buckle; PPV pars plana vitrectomy; VH Vitreous hemorrhage; PRP panretinal laser photocoagulation; IVK intravitreal kenalog; VMT vitreomacular traction; MH Macular hole;  NVD neovascularization of the disc; NVE neovascularization elsewhere; AREDS age related eye disease study; ARMD age related macular degeneration; POAG primary open angle glaucoma; EBMD epithelial/anterior basement membrane dystrophy; ACIOL anterior chamber intraocular lens; IOL intraocular lens; PCIOL posterior chamber intraocular lens; Phaco/IOL phacoemulsification with intraocular lens placement; [place] photorefractive keratectomy; LASIK laser assisted in situ keratomileusis; HTN hypertension; DM diabetes mellitus; COPD chronic obstructive pulmonary disease

## 2018-05-04 NOTE — Progress Notes (Signed)
[facility] Note  05/06/2018     CHIEF COMPLAINT Patient presents for Retina Follow Up   HISTORY OF PRESENT ILLNESS: Dean Pratt is a 83 y.o. male who presents to the clinic today for:   HPI    Retina Follow Up    Patient presents with  Diabetic Retinopathy.  In right eye.  This started 1 week ago.  Severity is moderate.  Duration of 1 week.  Since onset it is stable.  I, the attending physician,  performed the HPI with the patient and updated documentation appropriately.          Comments    Patient here for 1 week RT ( S/P laser retinopexy OD 04.24) Patient states vision doing better. Still feels like it has something in OD last night. Not right now. Used drops. No eye pain, just scratchiness.        Last edited by Dean Caffey, MD on 05/06/2018 10:08 AM. (History)    pt states he still feels like he has something in his eye, but states that it feels better this morning than it has since he left here last week, he states he did see some floaters after the laser, but they are gone now   Referring physician: [name], MD Dean Pratt, Dean Pratt  HISTORICAL INFORMATION:   Selected notes from the Dean Pratt Referred by Dr. Wyatt Pratt for concern of retinal tear LEE: 04.24.20 (S. Groat) [BCVA: OD: OS:] Ocular Hx- PMH-    CURRENT MEDICATIONS: Current Outpatient Medications (Ophthalmic Drugs)  Medication Sig  . fluorometholone (FML) 0.1 % ophthalmic suspension SHAKE LQ AND INT 1 GTT IN OU QID  . prednisoLONE acetate (PRED FORTE) 1 % ophthalmic suspension Place 1 drop into the right eye 4 (four) times daily for 7 days.   No current facility-administered medications for this visit.  (Ophthalmic Drugs)   Current Outpatient Medications (Other)  Medication Sig  . acetaminophen (TYLENOL) 325 MG tablet Take 650 mg by mouth every 6 (six) hours as needed.    Marland Kitchen allopurinol (ZYLOPRIM) 100 MG tablet Take 100 mg by mouth at  bedtime.    . Ascorbic Acid (VITAMIN C) 1000 MG tablet Take 1,000 mg by mouth daily.   Marland Kitchen aspirin 81 MG tablet Take 81 mg by mouth daily.    . diclofenac sodium (VOLTAREN) 1 % GEL APPLY 2-4 GRAMS 2-3 TIMES DAILY AS NEEDED FOR PAIN  . diltiazem (CARDIZEM CD) 240 MG 24 hr capsule TAKE 1 CAPSULE BY MOUTH EVERY DAY  . ibuprofen (ADVIL,MOTRIN) 200 MG tablet Take 200 mg by mouth every 6 (six) hours as needed.  Marland Kitchen LORazepam (ATIVAN) 1 MG tablet TAKE 1/2-1 TABLET TWICE DAILY AS NEEDED  . losartan (COZAAR) 25 MG tablet Take 1 tablet (25 mg total) by mouth daily.  . NON FORMULARY Stool softener 100 mg... Take 1 capsule by mouth once daily.   . rosuvastatin (CRESTOR) 10 MG tablet Take 10 mg by mouth daily.   Current Facility-Administered Medications (Other)  Medication Route  . 0.9 %  sodium chloride infusion Intravenous  . bupivacaine (MARCAINE) 0.5 % (with pres) injection 3 mL Other      REVIEW OF SYSTEMS: ROS    Positive for: Eyes   Negative for: Constitutional, Gastrointestinal, Neurological, Skin, Genitourinary, Musculoskeletal, HENT, Endocrine, Cardiovascular, Respiratory, Psychiatric, Allergic/Imm, Heme/Lymph   Last edited by Dean Pratt on 05/06/2018  9:07 AM. (History)       ALLERGIES  Allergies  Allergen Reactions  . Ace Inhibitors Cough  . Warfarin And Related     Intercranial bleed    PAST MEDICAL HISTORY Past Medical History:  Diagnosis Date  . Anxiety   . Atrial flutter ([place])    s/p afib and atrial flutter ablation 04/09/09  . Blood transfusion without reported diagnosis   . BPH (benign prostatic hyperplasia)   . Cancer ([place])    basal cell CA removed  . Cataract    removed with lens implants   . Colitis, ischemic ([place])    secondary to ebolism from afib 1/10  . Constipation   . GERD (gastroesophageal reflux disease)   . Gout   . HTN (hypertension)   . Hyperlipidemia   . Intracranial bleed ([place]) 04/10/10   Right occipital hematoma with a left homonymous  hemianopsia   . Migraines   . Paroxysmal atrial fibrillation ([place])    s/p PVI 04/09/09 and 10/17/10  . Rosacea   . Stroke ([place]) 04/08/2010   hemorragic, anticoagulation stopped at that time  . Tuberculosis    s/p treatment 1964   Past Surgical History:  Procedure Laterality Date  . ablation  04/09/2009   s/p afib and atrial flutter ablation by JA  . ABLATION OF DYSRHYTHMIC FOCUS  10/15/09   repeat afib ablation by JA  . APPENDECTOMY    . CATARACT EXTRACTION    . CATARACT EXTRACTION, BILATERAL     with lens implants   . COLONOSCOPY    . CORNEAL LACERATION REPAIR    . EYE SURGERY    . INGUINAL HERNIA REPAIR    . POLYPECTOMY    . TONSILLECTOMY    . VASECTOMY      FAMILY HISTORY Family History  Problem Relation Age of Onset  . Liver cancer Father   . Prostate cancer Father   . Coronary artery disease Father   . COPD Mother   . Breast cancer Sister   . Colon cancer Neg Hx   . Rectal cancer Neg Hx   . Stomach cancer Neg Hx   . Esophageal cancer Neg Hx   . Colon polyps Neg Hx     SOCIAL HISTORY Social History   Tobacco Use  . Smoking status: Former Research scientist (life sciences)  . Smokeless tobacco: Never Used  Substance Use Topics  . Alcohol use: No  . Drug use: No         OPHTHALMIC EXAM:  Base Eye Exam    Visual Acuity (Snellen - Linear)      Right Left   Dist cc 20/25 +1 20/20   Dist ph cc NI    Correction:  Glasses       Tonometry (Tonopen, 8:59 AM)      Right Left   Pressure 21 21       Pupils      Dark Light Shape React APD   Right 3 2 Round Brisk None   Left 3 2 Round Brisk None       Visual Fields (Counting fingers)      Left Right    Full Full       Extraocular Movement      Right Left    Full, Ortho Full, Ortho       Neuro/Psych    Oriented x3:  Yes   Mood/Affect:  Normal       Dilation    Both eyes:  1.0% Mydriacyl, 2.5% Phenylephrine @ 8:59 AM        Slit  Lamp and Fundus Exam    Slit Lamp Exam      Right Left   Lids/Lashes Dermatochalasis  - upper lid, mild Meibomian gland dysfunction, mild Ptosis Dermatochalasis - upper lid, mild Meibomian gland dysfunction, mild Ptosis   Conjunctiva/Sclera mild temporal Pinguecula mild temporal Pinguecula   Cornea 1+ Punctate epithelial erosions, Debris in tear film 1-+ Punctate epithelial erosions, mild EBMD   Anterior Chamber deep and clear deep and clear   Iris Round and dilated Round and dilated   Lens PC IOL in good position with open PC PC IOL in good position with open PC   Vitreous Vitreous syneresis, trace pigment in anterior vitreous Vitreous syneresis       Fundus Exam      Right Left   Disc Pink and Sharp Pink and Sharp   C/D Ratio 0.3 0.2   Macula Flat, Blunted foveal reflex, Retinal pigment epithelial mottling Good foveal reflex, Retinal pigment epithelial mottling   Vessels Vascular attenuation, mild Tortuous Vascular attenuation   Periphery retinal tear at 1030 with surrounding SRF/pigment -- good early  laser changes; remaining retina attached with scattered reticular and peripheral drusen Attached, scattered reticular and peripheral drusen        Refraction    Wearing Rx      Sphere Cylinder Axis Add   Right -0.75 +2.00 012 +1.50   Left +0.50 +1.75 171 +1.50          IMAGING AND PROCEDURES  Imaging and Procedures for @TODAY @  OCT, Retina - OU - Both Eyes       Right Eye Quality was good. Central Foveal Thickness: 282. Progression has been stable. Findings include normal foveal contour, no IRF, no SRF.   Left Eye Quality was good. Central Foveal Thickness: 279. Progression has been stable. Findings include normal foveal contour, no SRF, no IRF.   Notes *Images captured and stored on drive  Diagnosis / Impression:  NFP; no IRF/SRF OU  Clinical management:   See below  Abbreviations: NFP - Normal foveal profile. CME - cystoid macular edema. PED - pigment epithelial detachment. IRF - intraretinal fluid. SRF - subretinal fluid. EZ - ellipsoid zone. ERM  - epiretinal membrane. ORA - outer retinal atrophy. ORT - outer retinal tubulation. SRHM - subretinal hyper-reflective material                 ASSESSMENT/PLAN:    ICD-10-CM   1. Retinal tear of right eye H33.311   2. Right retinal detachment H33.21   3. Retinal edema H35.81 OCT, Retina - OU - Both Eyes  4. Essential hypertension I10   5. Hypertensive retinopathy of both eyes H35.033   6. Pseudophakia of both eyes Z96.1   7. Allergic conjunctivitis of both eyes H10.13     1-3. Retinal tear with surrounding SRF and focal RD, OD     - The incidence, risk factors, and natural history of retinal tear was discussed with patient.    - Potential treatment options including laser retinopexy and cryotherapy discussed with patient.  - tear located at 1030 with surrounding SRF and incomplete pigmented demarcation line  - s/p laser retinopexy OD (04.24.20) -- good early laser changes in place  - completed PF QID x7 days  - f/u in 2 wks  4,5. Hypertensive retinopathy OU  - discussed importance of tight BP control  - monitor  6. Pseudophakia OU  - s/p CE/IOL  - beautiful surgery, doing well  - Dr. Clent Jacks (2016/2017)  -  monitor  7. Allergic Conjunctivitis OU  - stable  - management per [place] Ordered this visit:  No orders of the defined types were placed in this encounter.      Return for any time week of May 11 -- f/u s/p laser OD.  There are no Patient Instructions on file for this visit.   Explained the diagnoses, plan, and follow up with the patient and they expressed understanding.  Patient expressed understanding of the importance of proper follow up care.   This document serves as a record of services personally performed by Dean Sleeper, MD, PhD. It was created on their behalf by Dean Pratt, OA, an ophthalmic assistant. The creation of this record is the provider's dictation and/or activities during the visit.     Electronically signed by: Dean Pratt, OA  04.29.2020 12:08 PM     Dean Pratt, M.D., Ph.D. Diseases & Surgery of the Retina and Vitreous [facility]  I have reviewed the above documentation for accuracy and completeness, and I agree with the above. Dean Pratt, M.D., Ph.D. 05/06/18 12:09 PM     Abbreviations: M myopia (nearsighted); A astigmatism; H hyperopia (farsighted); P presbyopia; Mrx spectacle prescription;  CTL contact lenses; OD right eye; OS left eye; OU both eyes  XT exotropia; ET esotropia; PEK punctate epithelial keratitis; PEE punctate epithelial erosions; DES dry eye syndrome; MGD meibomian gland dysfunction; ATs artificial tears; PFAT's preservative free artificial tears; Schulter nuclear sclerotic cataract; PSC posterior subcapsular cataract; ERM epi-retinal membrane; PVD posterior vitreous detachment; RD retinal detachment; DM diabetes mellitus; DR diabetic retinopathy; NPDR non-proliferative diabetic retinopathy; PDR proliferative diabetic retinopathy; CSME clinically significant macular edema; DME diabetic macular edema; dbh dot blot hemorrhages; CWS cotton wool spot; POAG primary open angle glaucoma; C/D cup-to-disc ratio; HVF humphrey visual field; GVF goldmann visual field; OCT optical coherence tomography; IOP intraocular pressure; BRVO Branch retinal vein occlusion; CRVO central retinal vein occlusion; CRAO central retinal artery occlusion; BRAO branch retinal artery occlusion; RT retinal tear; SB scleral buckle; PPV pars plana vitrectomy; VH Vitreous hemorrhage; PRP panretinal laser photocoagulation; IVK intravitreal kenalog; VMT vitreomacular traction; MH Macular hole;  NVD neovascularization of the disc; NVE neovascularization elsewhere; AREDS age related eye disease study; ARMD age related macular degeneration; POAG primary open angle glaucoma; EBMD epithelial/anterior basement membrane dystrophy; ACIOL anterior chamber intraocular lens; IOL  intraocular lens; PCIOL posterior chamber intraocular lens; Phaco/IOL phacoemulsification with intraocular lens placement; Latimer photorefractive keratectomy; LASIK laser assisted in situ keratomileusis; HTN hypertension; DM diabetes mellitus; COPD chronic obstructive pulmonary disease

## 2018-05-06 ENCOUNTER — Ambulatory Visit (INDEPENDENT_AMBULATORY_CARE_PROVIDER_SITE_OTHER): Payer: Medicare HMO | Admitting: Ophthalmology

## 2018-05-06 ENCOUNTER — Encounter (INDEPENDENT_AMBULATORY_CARE_PROVIDER_SITE_OTHER): Payer: Self-pay | Admitting: Ophthalmology

## 2018-05-06 ENCOUNTER — Other Ambulatory Visit: Payer: Self-pay

## 2018-05-06 ENCOUNTER — Encounter (INDEPENDENT_AMBULATORY_CARE_PROVIDER_SITE_OTHER): Payer: Medicare HMO | Admitting: Ophthalmology

## 2018-05-06 DIAGNOSIS — H35033 Hypertensive retinopathy, bilateral: Secondary | ICD-10-CM

## 2018-05-06 DIAGNOSIS — H1013 Acute atopic conjunctivitis, bilateral: Secondary | ICD-10-CM

## 2018-05-06 DIAGNOSIS — Z961 Presence of intraocular lens: Secondary | ICD-10-CM

## 2018-05-06 DIAGNOSIS — H3581 Retinal edema: Secondary | ICD-10-CM | POA: Diagnosis not present

## 2018-05-06 DIAGNOSIS — H33311 Horseshoe tear of retina without detachment, right eye: Secondary | ICD-10-CM

## 2018-05-06 DIAGNOSIS — H3321 Serous retinal detachment, right eye: Secondary | ICD-10-CM

## 2018-05-06 DIAGNOSIS — I10 Essential (primary) hypertension: Secondary | ICD-10-CM

## 2018-05-12 ENCOUNTER — Telehealth (INDEPENDENT_AMBULATORY_CARE_PROVIDER_SITE_OTHER): Payer: Self-pay

## 2018-05-13 ENCOUNTER — Encounter (INDEPENDENT_AMBULATORY_CARE_PROVIDER_SITE_OTHER): Payer: Self-pay | Admitting: Ophthalmology

## 2018-05-13 ENCOUNTER — Other Ambulatory Visit: Payer: Self-pay

## 2018-05-13 ENCOUNTER — Ambulatory Visit (INDEPENDENT_AMBULATORY_CARE_PROVIDER_SITE_OTHER): Payer: Medicare HMO | Admitting: Ophthalmology

## 2018-05-13 DIAGNOSIS — Z961 Presence of intraocular lens: Secondary | ICD-10-CM

## 2018-05-13 DIAGNOSIS — H33311 Horseshoe tear of retina without detachment, right eye: Secondary | ICD-10-CM

## 2018-05-13 DIAGNOSIS — H1013 Acute atopic conjunctivitis, bilateral: Secondary | ICD-10-CM

## 2018-05-13 DIAGNOSIS — S0501XA Injury of conjunctiva and corneal abrasion without foreign body, right eye, initial encounter: Secondary | ICD-10-CM

## 2018-05-13 DIAGNOSIS — H3321 Serous retinal detachment, right eye: Secondary | ICD-10-CM

## 2018-05-13 DIAGNOSIS — H3581 Retinal edema: Secondary | ICD-10-CM | POA: Diagnosis not present

## 2018-05-13 DIAGNOSIS — I10 Essential (primary) hypertension: Secondary | ICD-10-CM

## 2018-05-13 DIAGNOSIS — H35033 Hypertensive retinopathy, bilateral: Secondary | ICD-10-CM

## 2018-05-13 MED ORDER — BACITRACIN-POLYMYXIN B 500-10000 UNIT/GM OP OINT: TOPICAL_OINTMENT | Freq: Four times a day (QID) | OPHTHALMIC | 1 refills | Status: DC

## 2018-05-13 NOTE — Progress Notes (Signed)
[facility] Note  05/13/2018     CHIEF COMPLAINT Patient presents for Eye Pain   HISTORY OF PRESENT ILLNESS: Dean Pratt is a 83 y.o. male who presents to the clinic today for:   HPI    Eye Pain      In right eye.  Characterized as foreign body sensation.  Pain was noted as 3/10.  Occurring intermittently.  It is worse in the evening.  Duration of 4 days.  Since onset it is gradually improving.  Associated symptoms include tearing.  Negative for blurred vision, foreign body sensation, discharge, redness, itching, photophobia, double vision, swelling, headaches, recent URI, nausea, sinusitis, postnasal drip and migraines.  Treatments tried include eye drops.  Response to treatment was mild improvement.  I, the attending physician,  performed the HPI with the patient and updated documentation appropriately.          Comments    Patient c/o tearing/watering OD for the past 4 days. Some light sensitivity OD. Slight decrease in vision OD. Using artificial tears every 3 hours, has helped some with discomfort. S/P laser break OD (04.24.2020).        Last edited by Bernarda Caffey, MD on 05/13/2018  9:03 AM. (History)    pt states 3 or 4 days ago his eye started hurting, he states he has not done anything to get something in his eye, he states he has not been rubbing his eye   Referring physician: [name], MD [place], [place] [id number]  HISTORICAL INFORMATION:   Selected notes from the [facility] Referred by Dr. Wyatt Portela for concern of retinal tear LEE: 04.24.20 (S. Groat) [BCVA: OD: OS:] Ocular Hx- PMH-    CURRENT MEDICATIONS: Current Outpatient Medications (Ophthalmic Drugs)  Medication Sig  . bacitracin-polymyxin b (POLYSPORIN) ophthalmic ointment Place into the right eye 4 (four) times daily. Place a 1/2 inch ribbon of ointment into the lower eyelid.  . fluorometholone (FML) 0.1 % ophthalmic suspension SHAKE LQ AND INT  1 GTT IN OU QID   No current facility-administered medications for this visit.  (Ophthalmic Drugs)   Current Outpatient Medications (Other)  Medication Sig  . acetaminophen (TYLENOL) 325 MG tablet Take 650 mg by mouth every 6 (six) hours as needed.    Marland Kitchen allopurinol (ZYLOPRIM) 100 MG tablet Take 100 mg by mouth at bedtime.    . Ascorbic Acid (VITAMIN C) 1000 MG tablet Take 1,000 mg by mouth daily.   Marland Kitchen aspirin 81 MG tablet Take 81 mg by mouth daily.    . diclofenac sodium (VOLTAREN) 1 % GEL APPLY 2-4 GRAMS 2-3 TIMES DAILY AS NEEDED FOR PAIN  . diltiazem (CARDIZEM CD) 240 MG 24 hr capsule TAKE 1 CAPSULE BY MOUTH EVERY DAY  . ibuprofen (ADVIL,MOTRIN) 200 MG tablet Take 200 mg by mouth every 6 (six) hours as needed.  Marland Kitchen LORazepam (ATIVAN) 1 MG tablet TAKE 1/2-1 TABLET TWICE DAILY AS NEEDED  . losartan (COZAAR) 25 MG tablet Take 1 tablet (25 mg total) by mouth daily.  . NON FORMULARY Stool softener 100 mg... Take 1 capsule by mouth once daily.   . rosuvastatin (CRESTOR) 10 MG tablet Take 10 mg by mouth daily.   Current Facility-Administered Medications (Other)  Medication Route  . 0.9 %  sodium chloride infusion Intravenous  . bupivacaine (MARCAINE) 0.5 % (with pres) injection 3 mL Other      REVIEW OF SYSTEMS: ROS    Positive for: Eyes  Negative for: Constitutional, Gastrointestinal, Neurological, Skin, Genitourinary, Musculoskeletal, HENT, Endocrine, Cardiovascular, Respiratory, Psychiatric, Allergic/Imm, Heme/Lymph   Last edited by Roselee Nova D on 05/13/2018  8:55 AM. (History)       ALLERGIES Allergies  Allergen Reactions  . Ace Inhibitors Cough  . Warfarin And Related     Intercranial bleed    PAST MEDICAL HISTORY Past Medical History:  Diagnosis Date  . Anxiety   . Atrial flutter ([place])    s/p afib and atrial flutter ablation 04/09/09  . Blood transfusion without reported diagnosis   . BPH (benign prostatic hyperplasia)   . Cancer ([place])    basal cell CA removed  .  Cataract    removed with lens implants   . Colitis, ischemic ([place])    secondary to ebolism from afib 1/10  . Constipation   . GERD (gastroesophageal reflux disease)   . Gout   . HTN (hypertension)   . Hyperlipidemia   . Intracranial bleed ([place]) 04/10/10   Right occipital hematoma with a left homonymous hemianopsia   . Migraines   . Paroxysmal atrial fibrillation ([place])    s/p PVI 04/09/09 and 10/17/10  . Rosacea   . Stroke ([place]) 04/08/2010   hemorragic, anticoagulation stopped at that time  . Tuberculosis    s/p treatment 1964   Past Surgical History:  Procedure Laterality Date  . ablation  04/09/2009   s/p afib and atrial flutter ablation by [facility]  . ABLATION OF DYSRHYTHMIC FOCUS  10/15/09   repeat afib ablation by [facility]  . APPENDECTOMY    . CATARACT EXTRACTION    . CATARACT EXTRACTION, BILATERAL     with lens implants   . COLONOSCOPY    . CORNEAL LACERATION REPAIR    . EYE SURGERY    . INGUINAL HERNIA REPAIR    . POLYPECTOMY    . TONSILLECTOMY    . VASECTOMY      FAMILY HISTORY Family History  Problem Relation Age of Onset  . Liver cancer Father   . Prostate cancer Father   . Coronary artery disease Father   . COPD Mother   . Breast cancer Sister   . Colon cancer Neg Hx   . Rectal cancer Neg Hx   . Stomach cancer Neg Hx   . Esophageal cancer Neg Hx   . Colon polyps Neg Hx     SOCIAL HISTORY Social History   Tobacco Use  . Smoking status: Former Research scientist (life sciences)  . Smokeless tobacco: Never Used  Substance Use Topics  . Alcohol use: No  . Drug use: No         OPHTHALMIC EXAM:  Base Eye Exam    Visual Acuity (Snellen - Linear)      Right Left   Dist cc 20/50 +2 20/20 -1   Dist ph cc 20/30    Correction:  Glasses       Tonometry (Tonopen, 9:09 AM)      Right Left   Pressure 22 21       Pupils      Dark Light Shape React APD   Right 3 2 Round Brisk None   Left 3 2 Round Brisk None       Visual Fields (Counting fingers)      Left Right    Full Full        Extraocular Movement      Right Left    Full, Ortho Full, Ortho       Neuro/Psych  Oriented x3:  Yes   Mood/Affect:  Normal       Dilation    Both eyes:  1.0% Mydriacyl, 2.5% Phenylephrine @ 9:09 AM        Slit Lamp and Fundus Exam    Slit Lamp Exam      Right Left   Lids/Lashes Dermatochalasis - upper lid, mild Meibomian gland dysfunction, mild Ptosis Dermatochalasis - upper lid, mild Meibomian gland dysfunction, mild Ptosis   Conjunctiva/Sclera mild temporal Pinguecula mild temporal Pinguecula   Cornea 3+ Punctate epithelial erosions, Debris in tear film, 25mm horizontal linear fluorescein staining inferior paracentral 1-+ Punctate epithelial erosions, mild EBMD   Anterior Chamber deep and clear deep and clear   Iris Round and dilated Round and dilated   Lens PC IOL in good position with open PC PC IOL in good position with open PC   Vitreous Vitreous syneresis, trace pigment in anterior vitreous Vitreous syneresis       Fundus Exam      Right Left   Disc Pink and Sharp Pink and Sharp   C/D Ratio 0.3 0.2   Macula Flat, Blunted foveal reflex, Retinal pigment epithelial mottling Good foveal reflex, Retinal pigment epithelial mottling   Vessels Vascular attenuation, mild Tortuous Vascular attenuation   Periphery retinal tear at 1030 with surrounding SRF/pigment -- good early  laser changes; remaining retina attached with scattered reticular and peripheral drusen Attached, scattered reticular and peripheral drusen        Refraction    Wearing Rx      Sphere Cylinder Axis Add   Right -0.75 +2.00 012 +1.50   Left +0.50 +1.75 171 +1.50       Manifest Refraction      Sphere Cylinder Axis Dist VA   Right -1.25 +2.00 017 20/40-2   Left              IMAGING AND PROCEDURES  Imaging and Procedures for @TODAY @  OCT, Retina - OU - Both Eyes       Right Eye Quality was good. Central Foveal Thickness: 284. Progression has been stable. Findings include normal foveal  contour, no IRF, no SRF.   Left Eye Quality was good. Central Foveal Thickness: 275. Progression has been stable. Findings include normal foveal contour, no SRF, no IRF.   Notes *Images captured and stored on drive  Diagnosis / Impression:  NFP; no IRF/SRF OU  Clinical management:   See below  Abbreviations: NFP - Normal foveal profile. CME - cystoid macular edema. PED - pigment epithelial detachment. IRF - intraretinal fluid. SRF - subretinal fluid. EZ - ellipsoid zone. ERM - epiretinal membrane. ORA - outer retinal atrophy. ORT - outer retinal tubulation. SRHM - subretinal hyper-reflective material                 ASSESSMENT/PLAN:    ICD-10-CM   1. Retinal tear of right eye H33.311   2. Right retinal detachment H33.21   3. Retinal edema H35.81 OCT, Retina - OU - Both Eyes  4. Essential hypertension I10   5. Hypertensive retinopathy of both eyes H35.033   6. Pseudophakia of both eyes Z96.1   7. Allergic conjunctivitis of both eyes H10.13   8. Abrasion of right cornea, initial encounter S05.01XA     8. Healing Corneal Abrasion OD  - pt reports FBS and tearing  - 41mm horizontal linear fluorescein staining inferior paracentral -- epithelium intact  - start PSO Ung QID OD  - f/u Tuesday, May 12  as scheduled  1-3. Retinal tear with surrounding SRF and focal RD, OD     - The incidence, risk factors, and natural history of retinal tear was discussed with patient.    - Potential treatment options including laser retinopexy and cryotherapy discussed with patient  - tear located at 1030 with surrounding SRF and incomplete pigmented demarcation line  - s/p laser retinopexy OD (04.24.20) -- good early laser changes in place  - f/u in 2 wks  4,5. Hypertensive retinopathy OU  - discussed importance of tight BP control  - monitor  6. Pseudophakia OU  - s/p CE/IOL  - beautiful surgery, doing well  - Dr. Clent Jacks (2016/2017)  - monitor  7. Allergic Conjunctivitis  OU  - stable  - management per [place] Ordered this visit:  Meds ordered this encounter  Medications  . bacitracin-polymyxin b (POLYSPORIN) ophthalmic ointment    Sig: Place into the right eye 4 (four) times daily. Place a 1/2 inch ribbon of ointment into the lower eyelid.    Dispense:  3.5 g    Refill:  1       Return for as scheduled.  There are no Patient Instructions on file for this visit.   Explained the diagnoses, plan, and follow up with the patient and they expressed understanding.  Patient expressed understanding of the importance of proper follow up care.   This document serves as a record of services personally performed by Gardiner Sleeper, MD, PhD. It was created on their behalf by Ernest Mallick, OA, an ophthalmic assistant. The creation of this record is the provider's dictation and/or activities during the visit.    Electronically signed by: Ernest Mallick, OA  05.08.2020 9:44 AM    Gardiner Sleeper, M.D., Ph.D. Diseases & Surgery of the Retina and Vitreous [facility]   I have reviewed the above documentation for accuracy and completeness, and I agree with the above. Gardiner Sleeper, M.D., Ph.D. 05/13/18 9:44 AM   Abbreviations: M myopia (nearsighted); A astigmatism; H hyperopia (farsighted); P presbyopia; Mrx spectacle prescription;  CTL contact lenses; OD right eye; OS left eye; OU both eyes  XT exotropia; ET esotropia; PEK punctate epithelial keratitis; PEE punctate epithelial erosions; DES dry eye syndrome; MGD meibomian gland dysfunction; ATs artificial tears; PFAT's preservative free artificial tears; [place] nuclear sclerotic cataract; PSC posterior subcapsular cataract; ERM epi-retinal membrane; PVD posterior vitreous detachment; RD retinal detachment; DM diabetes mellitus; DR diabetic retinopathy; NPDR non-proliferative diabetic retinopathy; PDR proliferative diabetic retinopathy; CSME clinically significant  macular edema; DME diabetic macular edema; dbh dot blot hemorrhages; CWS cotton wool spot; POAG primary open angle glaucoma; C/D cup-to-disc ratio; HVF humphrey visual field; GVF goldmann visual field; OCT optical coherence tomography; IOP intraocular pressure; BRVO Branch retinal vein occlusion; CRVO central retinal vein occlusion; CRAO central retinal artery occlusion; BRAO branch retinal artery occlusion; RT retinal tear; SB scleral buckle; PPV pars plana vitrectomy; VH Vitreous hemorrhage; PRP panretinal laser photocoagulation; IVK intravitreal kenalog; VMT vitreomacular traction; MH Macular hole;  NVD neovascularization of the disc; NVE neovascularization elsewhere; AREDS age related eye disease study; ARMD age related macular degeneration; POAG primary open angle glaucoma; EBMD epithelial/anterior basement membrane dystrophy; ACIOL anterior chamber intraocular lens; IOL intraocular lens; PCIOL posterior chamber intraocular lens; Phaco/IOL phacoemulsification with intraocular lens placement; [place] photorefractive keratectomy; LASIK laser assisted in situ keratomileusis; HTN hypertension; DM diabetes mellitus; COPD chronic obstructive pulmonary disease

## 2018-05-16 NOTE — Progress Notes (Signed)
[facility] Note  05/17/2018     CHIEF COMPLAINT Patient presents for Retina Evaluation and Post-op Follow-up   HISTORY OF PRESENT ILLNESS: Dean Pratt is a 83 y.o. male who presents to the clinic today for:   HPI    Retina Evaluation    In right eye.  This started weeks ago.  Duration of weeks.  Associated Symptoms Pain.  Response to treatment was significant improvement.  I, the attending physician,  performed the HPI with the patient and updated documentation appropriately.          Post-op Follow-up    In right eye.  Discomfort includes pain.  Vision is improved.  I, the attending physician,  performed the HPI with the patient and updated documentation appropriately.          Comments    Patient states his vision has greatly improved OD.  Patient denies any eye pain.  Patient still has foreign body sensation OD.  Patient denies any new or worsening floaters or fol OU.       Last edited by Bernarda Caffey, MD on 05/17/2018  1:26 PM. (History)    pt states his eye is feeling a lot better, he states he was able to get the ointment and it helped a lot   Referring physician: [name], MD [facility], [place] [address]  HISTORICAL INFORMATION:   Selected notes from the [facility] Referred by Dr. Wyatt Portela for concern of retinal tear LEE: 04.24.20 (S. Groat) [BCVA: OD: OS:] Ocular Hx- PMH-    CURRENT MEDICATIONS: Current Outpatient Medications (Ophthalmic Drugs)  Medication Sig  . bacitracin-polymyxin b (POLYSPORIN) ophthalmic ointment Place into the right eye 4 (four) times daily. Place a 1/2 inch ribbon of ointment into the lower eyelid.  . fluorometholone (FML) 0.1 % ophthalmic suspension SHAKE LQ AND INT 1 GTT IN OU QID   No current facility-administered medications for this visit.  (Ophthalmic Drugs)   Current Outpatient Medications (Other)  Medication Sig  . acetaminophen (TYLENOL) 325 MG tablet Take 650  mg by mouth every 6 (six) hours as needed.    Marland Kitchen allopurinol (ZYLOPRIM) 100 MG tablet Take 100 mg by mouth at bedtime.    . Ascorbic Acid (VITAMIN C) 1000 MG tablet Take 1,000 mg by mouth daily.   Marland Kitchen aspirin 81 MG tablet Take 81 mg by mouth daily.    . diclofenac sodium (VOLTAREN) 1 % GEL APPLY 2-4 GRAMS 2-3 TIMES DAILY AS NEEDED FOR PAIN  . diltiazem (CARDIZEM CD) 240 MG 24 hr capsule TAKE 1 CAPSULE BY MOUTH EVERY DAY  . ibuprofen (ADVIL,MOTRIN) 200 MG tablet Take 200 mg by mouth every 6 (six) hours as needed.  Marland Kitchen LORazepam (ATIVAN) 1 MG tablet TAKE 1/2-1 TABLET TWICE DAILY AS NEEDED  . losartan (COZAAR) 25 MG tablet Take 1 tablet (25 mg total) by mouth daily.  . NON FORMULARY Stool softener 100 mg... Take 1 capsule by mouth once daily.   . rosuvastatin (CRESTOR) 10 MG tablet Take 10 mg by mouth daily.   Current Facility-Administered Medications (Other)  Medication Route  . 0.9 %  sodium chloride infusion Intravenous  . bupivacaine (MARCAINE) 0.5 % (with pres) injection 3 mL Other      REVIEW OF SYSTEMS: ROS    Positive for: Eyes   Negative for: Constitutional, Gastrointestinal, Neurological, Skin, Genitourinary, Musculoskeletal, HENT, Endocrine, Cardiovascular, Respiratory, Psychiatric, Allergic/Imm, Heme/Lymph   Last edited by Doneen Poisson on  05/17/2018  8:46 AM. (History)       ALLERGIES Allergies  Allergen Reactions  . Ace Inhibitors Cough  . Warfarin And Related     Intercranial bleed    PAST MEDICAL HISTORY Past Medical History:  Diagnosis Date  . Anxiety   . Atrial flutter ([place])    s/p afib and atrial flutter ablation 04/09/09  . Blood transfusion without reported diagnosis   . BPH (benign prostatic hyperplasia)   . Cancer ([place])    basal cell CA removed  . Cataract    removed with lens implants   . Colitis, ischemic ([place])    secondary to ebolism from afib 1/10  . Constipation   . GERD (gastroesophageal reflux disease)   . Gout   . HTN (hypertension)   .  Hyperlipidemia   . Intracranial bleed ([place]) 04/10/10   Right occipital hematoma with a left homonymous hemianopsia   . Migraines   . Paroxysmal atrial fibrillation ([place])    s/p PVI 04/09/09 and 10/17/10  . Rosacea   . Stroke ([place]) 04/08/2010   hemorragic, anticoagulation stopped at that time  . Tuberculosis    s/p treatment 1964   Past Surgical History:  Procedure Laterality Date  . ablation  04/09/2009   s/p afib and atrial flutter ablation by JA  . ABLATION OF DYSRHYTHMIC FOCUS  10/15/09   repeat afib ablation by JA  . APPENDECTOMY    . CATARACT EXTRACTION    . CATARACT EXTRACTION, BILATERAL     with lens implants   . COLONOSCOPY    . CORNEAL LACERATION REPAIR    . EYE SURGERY    . INGUINAL HERNIA REPAIR    . POLYPECTOMY    . TONSILLECTOMY    . VASECTOMY      FAMILY HISTORY Family History  Problem Relation Age of Onset  . Liver cancer Father   . Prostate cancer Father   . Coronary artery disease Father   . COPD Mother   . Breast cancer Sister   . Colon cancer Neg Hx   . Rectal cancer Neg Hx   . Stomach cancer Neg Hx   . Esophageal cancer Neg Hx   . Colon polyps Neg Hx     SOCIAL HISTORY Social History   Tobacco Use  . Smoking status: Former Research scientist (life sciences)  . Smokeless tobacco: Never Used  Substance Use Topics  . Alcohol use: No  . Drug use: No         OPHTHALMIC EXAM:  Base Eye Exam    Visual Acuity (Snellen - Linear)      Right Left   Dist cc 20/20 -3 20/20 -2   Correction:  Glasses       Tonometry (Tonopen, 8:50 AM)      Right Left   Pressure 23 20       Pupils      Dark Light Shape React APD   Right 3 2 Round Minimal 0   Left 3 2 Round Minimal 0       Extraocular Movement      Right Left    Full Full       Neuro/Psych    Oriented x3:  Yes   Mood/Affect:  Normal       Dilation    Both eyes:  1.0% Mydriacyl, 2.5% Phenylephrine @ 8:50 AM        Slit Lamp and Fundus Exam    Slit Lamp Exam      Right Left  Lids/Lashes Dermatochalasis  - upper lid, mild Meibomian gland dysfunction, mild Ptosis Dermatochalasis - upper lid, mild Meibomian gland dysfunction, mild Ptosis   Conjunctiva/Sclera mild temporal Pinguecula mild temporal Pinguecula   Cornea Tr-1+ Punctate epithelial erosions, irregular epithelum improved, Arcus, Well healed ST cataract wounds 1+ Punctate epithelial erosions, mild EBMD   Anterior Chamber deep and clear deep and clear   Iris Round and dilated Round and dilated   Lens PC IOL in good position with open PC PC IOL in good position with open PC   Vitreous Vitreous syneresis, trace pigment in anterior vitreous Vitreous syneresis       Fundus Exam      Right Left   Disc Pink and Sharp Pink and Sharp   C/D Ratio 0.3 0.2   Macula Flat, Blunted foveal reflex, Retinal pigment epithelial mottling Good foveal reflex, Retinal pigment epithelial mottling   Vessels Vascular attenuation, mild Tortuous Vascular attenuation   Periphery retinal tear at 1030 with surrounding SRF/pigment -- good laser changes; remaining retina attached with scattered reticular and peripheral drusen Attached, scattered reticular and peripheral drusen        Refraction    Wearing Rx      Sphere Cylinder Axis Add   Right -0.75 +2.00 012 +1.50   Left +0.50 +1.75 171 +1.50          IMAGING AND PROCEDURES  Imaging and Procedures for @TODAY @  OCT, Retina - OU - Both Eyes       Right Eye Quality was good. Central Foveal Thickness: 285. Progression has been stable. Findings include normal foveal contour, no IRF, no SRF.   Left Eye Quality was good. Central Foveal Thickness: 274. Progression has been stable. Findings include normal foveal contour, no SRF, no IRF.   Notes *Images captured and stored on drive  Diagnosis / Impression:  NFP; no IRF/SRF OU  Clinical management:   See below  Abbreviations: NFP - Normal foveal profile. CME - cystoid macular edema. PED - pigment epithelial detachment. IRF - intraretinal fluid. SRF -  subretinal fluid. EZ - ellipsoid zone. ERM - epiretinal membrane. ORA - outer retinal atrophy. ORT - outer retinal tubulation. SRHM - subretinal hyper-reflective material                 ASSESSMENT/PLAN:    ICD-10-CM   1. Retinal tear of right eye H33.311   2. Right retinal detachment H33.21   3. Retinal edema H35.81 OCT, Retina - OU - Both Eyes  4. Essential hypertension I10   5. Hypertensive retinopathy of both eyes H35.033   6. Pseudophakia of both eyes Z96.1   7. Allergic conjunctivitis of both eyes H10.13   8. Abrasion of right cornea, initial encounter S05.01XA    1-3. Retinal tear with surrounding SRF and focal RD, OD     - The incidence, risk factors, and natural history of retinal tear was discussed with patient.    - Potential treatment options including laser retinopexy and cryotherapy discussed with patient  - tear located at 1030 with surrounding SRF and incomplete pigmented demarcation line  - s/p laser retinopexy OD (04.24.20) -- good laser changes in place  - f/u in 3 months, sooner prn  4,5. Hypertensive retinopathy OU  - discussed importance of tight BP control  - monitor  6. Pseudophakia OU  - s/p CE/IOL  - beautiful surgery, doing well  - Dr. Clent Jacks (2016/2017)  - monitor  7. Allergic Conjunctivitis OU  - stable  - management  per [facility]  8. Corneal Abrasion OD -- resolved  - pt presented urgently for FBS and tearing  - 71mm horizontal linear fluorescein staining inferior paracentral -- improved today  - okay to stop PSO Ung QID OD      Ophthalmic Meds Ordered this visit:  No orders of the defined types were placed in this encounter.      Return in about 3 months (around 08/17/2018) for f/u retinal tear OD, DFE, OCT.  There are no Patient Instructions on file for this visit.   Explained the diagnoses, plan, and follow up with the patient and they expressed understanding.  Patient expressed understanding of the importance of  proper follow up care.   This document serves as a record of services personally performed by Gardiner Sleeper, MD, PhD. It was created on their behalf by Ernest Mallick, OA, an ophthalmic assistant. The creation of this record is the provider's dictation and/or activities during the visit.    Electronically signed by: Ernest Mallick, OA  05.11.2020 1:26 PM    Gardiner Sleeper, M.D., Ph.D. Diseases & Surgery of the Retina and Vitreous [facility]  I have reviewed the above documentation for accuracy and completeness, and I agree with the above. Gardiner Sleeper, M.D., Ph.D. 05/17/18 1:35 PM     Abbreviations: M myopia (nearsighted); A astigmatism; H hyperopia (farsighted); P presbyopia; Mrx spectacle prescription;  CTL contact lenses; OD right eye; OS left eye; OU both eyes  XT exotropia; ET esotropia; PEK punctate epithelial keratitis; PEE punctate epithelial erosions; DES dry eye syndrome; MGD meibomian gland dysfunction; ATs artificial tears; PFAT's preservative free artificial tears; [place] nuclear sclerotic cataract; PSC posterior subcapsular cataract; ERM epi-retinal membrane; PVD posterior vitreous detachment; RD retinal detachment; DM diabetes mellitus; DR diabetic retinopathy; NPDR non-proliferative diabetic retinopathy; PDR proliferative diabetic retinopathy; CSME clinically significant macular edema; DME diabetic macular edema; dbh dot blot hemorrhages; CWS cotton wool spot; POAG primary open angle glaucoma; C/D cup-to-disc ratio; HVF humphrey visual field; GVF goldmann visual field; OCT optical coherence tomography; IOP intraocular pressure; BRVO Branch retinal vein occlusion; CRVO central retinal vein occlusion; CRAO central retinal artery occlusion; BRAO branch retinal artery occlusion; RT retinal tear; SB scleral buckle; PPV pars plana vitrectomy; VH Vitreous hemorrhage; PRP panretinal laser photocoagulation; IVK intravitreal kenalog; VMT vitreomacular traction; MH  Macular hole;  NVD neovascularization of the disc; NVE neovascularization elsewhere; AREDS age related eye disease study; ARMD age related macular degeneration; POAG primary open angle glaucoma; EBMD epithelial/anterior basement membrane dystrophy; ACIOL anterior chamber intraocular lens; IOL intraocular lens; PCIOL posterior chamber intraocular lens; Phaco/IOL phacoemulsification with intraocular lens placement; [place] photorefractive keratectomy; LASIK laser assisted in situ keratomileusis; HTN hypertension; DM diabetes mellitus; COPD chronic obstructive pulmonary disease

## 2018-05-17 ENCOUNTER — Ambulatory Visit (INDEPENDENT_AMBULATORY_CARE_PROVIDER_SITE_OTHER): Payer: Medicare HMO | Admitting: Ophthalmology

## 2018-05-17 ENCOUNTER — Other Ambulatory Visit: Payer: Self-pay

## 2018-05-17 ENCOUNTER — Encounter (INDEPENDENT_AMBULATORY_CARE_PROVIDER_SITE_OTHER): Payer: Self-pay | Admitting: Ophthalmology

## 2018-05-17 DIAGNOSIS — S0501XS Injury of conjunctiva and corneal abrasion without foreign body, right eye, sequela: Secondary | ICD-10-CM

## 2018-05-17 DIAGNOSIS — H3581 Retinal edema: Secondary | ICD-10-CM

## 2018-05-17 DIAGNOSIS — I10 Essential (primary) hypertension: Secondary | ICD-10-CM

## 2018-05-17 DIAGNOSIS — H35033 Hypertensive retinopathy, bilateral: Secondary | ICD-10-CM

## 2018-05-17 DIAGNOSIS — H1013 Acute atopic conjunctivitis, bilateral: Secondary | ICD-10-CM

## 2018-05-17 DIAGNOSIS — Z961 Presence of intraocular lens: Secondary | ICD-10-CM

## 2018-05-17 DIAGNOSIS — H3321 Serous retinal detachment, right eye: Secondary | ICD-10-CM

## 2018-05-17 DIAGNOSIS — H33311 Horseshoe tear of retina without detachment, right eye: Secondary | ICD-10-CM

## 2018-05-25 ENCOUNTER — Encounter: Payer: Self-pay | Admitting: Internal Medicine

## 2018-05-25 ENCOUNTER — Inpatient Hospital Stay: Payer: Medicare HMO | Admitting: Internal Medicine

## 2018-05-25 ENCOUNTER — Inpatient Hospital Stay: Payer: Medicare HMO

## 2018-05-25 ENCOUNTER — Inpatient Hospital Stay: Payer: Medicare HMO | Attending: Internal Medicine

## 2018-05-25 ENCOUNTER — Other Ambulatory Visit: Payer: Self-pay

## 2018-05-25 VITALS — BP 146/88 | HR 86 | Temp 98.7°F | Resp 20 | Ht 70.0 in | Wt 178.8 lb

## 2018-05-25 VITALS — BP 135/92 | HR 80 | Temp 97.7°F | Resp 16

## 2018-05-25 DIAGNOSIS — D45 Polycythemia vera: Secondary | ICD-10-CM | POA: Diagnosis not present

## 2018-05-25 DIAGNOSIS — R69 Illness, unspecified: Secondary | ICD-10-CM | POA: Diagnosis not present

## 2018-05-25 DIAGNOSIS — I480 Paroxysmal atrial fibrillation: Secondary | ICD-10-CM | POA: Diagnosis not present

## 2018-05-25 DIAGNOSIS — Z79899 Other long term (current) drug therapy: Secondary | ICD-10-CM | POA: Diagnosis not present

## 2018-05-25 DIAGNOSIS — F419 Anxiety disorder, unspecified: Secondary | ICD-10-CM | POA: Insufficient documentation

## 2018-05-25 DIAGNOSIS — N400 Enlarged prostate without lower urinary tract symptoms: Secondary | ICD-10-CM | POA: Diagnosis not present

## 2018-05-25 DIAGNOSIS — Z8673 Personal history of transient ischemic attack (TIA), and cerebral infarction without residual deficits: Secondary | ICD-10-CM | POA: Insufficient documentation

## 2018-05-25 DIAGNOSIS — I10 Essential (primary) hypertension: Secondary | ICD-10-CM | POA: Insufficient documentation

## 2018-05-25 DIAGNOSIS — Z791 Long term (current) use of non-steroidal anti-inflammatories (NSAID): Secondary | ICD-10-CM | POA: Diagnosis not present

## 2018-05-25 DIAGNOSIS — Z7982 Long term (current) use of aspirin: Secondary | ICD-10-CM | POA: Diagnosis not present

## 2018-05-25 DIAGNOSIS — E785 Hyperlipidemia, unspecified: Secondary | ICD-10-CM | POA: Insufficient documentation

## 2018-05-25 DIAGNOSIS — Z85828 Personal history of other malignant neoplasm of skin: Secondary | ICD-10-CM

## 2018-05-25 LAB — CMP (CANCER CENTER ONLY)
ALT: 19 U/L (ref 0–44)
AST: 19 U/L (ref 15–41)
Albumin: 4 g/dL (ref 3.5–5.0)
Alkaline Phosphatase: 61 U/L (ref 38–126)
Anion gap: 13 (ref 5–15)
BUN: 10 mg/dL (ref 8–23)
CO2: 21 mmol/L — ABNORMAL LOW (ref 22–32)
Calcium: 9.1 mg/dL (ref 8.9–10.3)
Chloride: 110 mmol/L (ref 98–111)
Creatinine: 1.13 mg/dL (ref 0.61–1.24)
GFR, Est AFR Am: >60 mL/min (ref >60)
GFR, Estimated: >60 mL/min (ref >60)
Glucose, Bld: 93 mg/dL (ref 70–99)
Potassium: 4 mmol/L (ref 3.5–5.1)
Sodium: 144 mmol/L (ref 135–145)
Total Bilirubin: 0.6 mg/dL (ref 0.3–1.2)
Total Protein: 6.7 g/dL (ref 6.5–8.1)

## 2018-05-25 LAB — CBC WITH DIFFERENTIAL (CANCER CENTER ONLY)
Abs Immature Granulocytes: 0.03 10*3/uL (ref 0.00–0.07)
Basophils Absolute: 0.1 10*3/uL (ref 0.0–0.1)
Basophils Relative: 1 %
Eosinophils Absolute: 0.1 10*3/uL (ref 0.0–0.5)
Eosinophils Relative: 2 %
HCT: 51.2 % (ref 39.0–52.0)
Hemoglobin: 16.2 g/dL (ref 13.0–17.0)
Immature Granulocytes: 0 %
Lymphocytes Relative: 12 %
Lymphs Abs: 0.9 10*3/uL (ref 0.7–4.0)
MCH: 27.6 pg (ref 26.0–34.0)
MCHC: 31.6 g/dL (ref 30.0–36.0)
MCV: 87.1 fL (ref 80.0–100.0)
Monocytes Absolute: 0.6 10*3/uL (ref 0.1–1.0)
Monocytes Relative: 8 %
Neutro Abs: 5.8 10*3/uL (ref 1.7–7.7)
Neutrophils Relative %: 77 %
Platelet Count: 405 10*3/uL — ABNORMAL HIGH (ref 150–400)
RBC: 5.88 MIL/uL — ABNORMAL HIGH (ref 4.22–5.81)
RDW: 12.8 % (ref 11.5–15.5)
WBC Count: 7.6 10*3/uL (ref 4.0–10.5)
nRBC: 0 % (ref 0.0–0.2)

## 2018-05-25 LAB — LACTATE DEHYDROGENASE: LDH: 155 U/L (ref 98–192)

## 2018-05-25 NOTE — Progress Notes (Signed)
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    PRIOR THERAPY: None  CURRENT THERAPY: Phlebotomy on as-needed basis.  INTERVAL HISTORY: Dean Pratt 83 y.o. male returns to the clinic today for follow-up visit.  The patient is feeling fine today with no concerning complaints except for recent diagnosis of retinal detachment and he is followed by ophthalmology.  The patient denied having any recent chest pain, shortness of breath, but has mild cough with no hemoptysis.  He has no nausea, vomiting, diarrhea or constipation.  He denied having any headache or visual changes.  The patient has no recent weight loss or night sweats.  He is here today for evaluation and repeat blood work.  MEDICAL HISTORY: Past Medical History:  Diagnosis Date  . Anxiety   . Atrial flutter ([place])    s/p afib and atrial flutter ablation 04/09/09  . Blood transfusion without reported diagnosis   . BPH (benign prostatic hyperplasia)   . Cancer ([place])    basal cell CA removed  . Cataract    removed with lens implants   . Colitis, ischemic ([place])    secondary to ebolism from afib 1/10  . Constipation   . GERD (gastroesophageal reflux disease)   . Gout   . HTN (hypertension)   . Hyperlipidemia   . Intracranial bleed ([place]) 04/10/10   Right occipital hematoma with a left homonymous hemianopsia   . Migraines   . Paroxysmal atrial fibrillation ([place])    s/p PVI 04/09/09 and 10/17/10  . Rosacea   . Stroke ([place]) 04/08/2010   hemorragic, anticoagulation stopped at that time  . Tuberculosis    s/p treatment 1964    ALLERGIES:  is allergic to ace inhibitors and warfarin and related.  MEDICATIONS:  Current Outpatient Medications  Medication Sig Dispense Refill  . acetaminophen (TYLENOL) 325 MG tablet Take 650 mg by mouth every 6 (six) hours as  needed.      Marland Kitchen allopurinol (ZYLOPRIM) 100 MG tablet Take 100 mg by mouth at bedtime.      . Ascorbic Acid (VITAMIN C) 1000 MG tablet Take 1,000 mg by mouth daily.     Marland Kitchen aspirin 81 MG tablet Take 81 mg by mouth daily.      . bacitracin-polymyxin b (POLYSPORIN) ophthalmic ointment Place into the right eye 4 (four) times daily. Place a 1/2 inch ribbon of ointment into the lower eyelid. 3.5 g 1  . diclofenac sodium (VOLTAREN) 1 % GEL APPLY 2-4 GRAMS 2-3 TIMES DAILY AS NEEDED FOR PAIN 100 g 0  . diltiazem (CARDIZEM CD) 240 MG 24 hr capsule TAKE 1 CAPSULE BY MOUTH EVERY DAY 30 capsule 3  . fluorometholone (FML) 0.1 % ophthalmic suspension SHAKE LQ AND INT 1 GTT IN OU QID  0  . ibuprofen (ADVIL,MOTRIN) 200 MG tablet Take 200 mg by mouth every 6 (six) hours as needed.    Marland Kitchen LORazepam (ATIVAN) 1 MG tablet TAKE 1/2-1 TABLET TWICE DAILY AS NEEDED  3  . losartan (COZAAR) 25 MG tablet Take 1 tablet (25 mg total) by mouth daily. 90 tablet 3  . NON FORMULARY Stool softener 100 mg... Take 1 capsule by mouth once daily.     . rosuvastatin (CRESTOR) 10 MG tablet Take 10 mg by mouth daily.  2   Current Facility-Administered Medications  Medication Dose  Route Frequency Provider Last Rate Last Dose  . 0.9 %  sodium chloride infusion  500 mL Intravenous Once Irene Shipper, MD      . bupivacaine (MARCAINE) 0.5 % (with pres) injection 3 mL  3 mL Other Once Magnus Sinning, MD        SURGICAL HISTORY:  Past Surgical History:  Procedure Laterality Date  . ablation  04/09/2009   s/p afib and atrial flutter ablation by JA  . ABLATION OF DYSRHYTHMIC FOCUS  10/15/09   repeat afib ablation by JA  . APPENDECTOMY    . CATARACT EXTRACTION    . CATARACT EXTRACTION, BILATERAL     with lens implants   . COLONOSCOPY    . CORNEAL LACERATION REPAIR    . EYE SURGERY    . INGUINAL HERNIA REPAIR    . POLYPECTOMY    . TONSILLECTOMY    . VASECTOMY      REVIEW OF SYSTEMS:  A comprehensive review of systems was negative  except for: Respiratory: positive for cough   PHYSICAL EXAMINATION: General appearance: alert, cooperative and no distress Head: Normocephalic, without obvious abnormality, atraumatic Neck: no adenopathy, no JVD, supple, symmetrical, trachea midline and thyroid not enlarged, symmetric, no tenderness/mass/nodules Lymph nodes: Cervical, supraclavicular, and axillary nodes normal. Resp: clear to auscultation bilaterally Back: symmetric, no curvature. ROM normal. No CVA tenderness. Cardio: regular rate and rhythm, S1, S2 normal, no murmur, click, rub or gallop GI: soft, non-tender; bowel sounds normal; no masses,  no organomegaly Extremities: extremities normal, atraumatic, no cyanosis or edema  ECOG PERFORMANCE STATUS: 1 - Symptomatic but completely ambulatory  Blood pressure (!) 146/88, pulse 86, temperature 98.7 F (37.1 C), temperature source Oral, resp. rate 20, height 5\' 10"  (1.778 m), weight 178 lb 12.8 oz (81.1 kg), SpO2 99 %.  LABORATORY DATA: Lab Results  Component Value Date   WBC 7.6 05/25/2018   HGB 16.2 05/25/2018   HCT 51.2 05/25/2018   MCV 87.1 05/25/2018   PLT 405 (H) 05/25/2018      Chemistry      Component Value Date/Time   NA 139 02/24/2018 0831   K 4.3 02/24/2018 0831   CL 106 02/24/2018 0831   CO2 22 02/24/2018 0831   BUN 16 02/24/2018 0831   CREATININE 0.96 02/24/2018 0831      Component Value Date/Time   CALCIUM 9.8 02/24/2018 0831   ALKPHOS 66 02/24/2018 0831   AST 14 (L) 02/24/2018 0831   ALT 23 02/24/2018 0831   BILITOT 0.7 02/24/2018 0831       RADIOGRAPHIC STUDIES: No results found.  ASSESSMENT AND PLAN: This is a very pleasant 83 years old white male with recently diagnosed polycythemia vera with positive Jak 2 mutation.  The patient is also currently on hormonal therapy for androgen deficiency. The patient is doing fine today with no concerning complaints. Repeat CBC today showed hematocrit of 51.2%. I recommended for the patient to  proceed with phlebotomy today to keep his hematocrit around 45%. I will see the patient back for follow-up visit in 3 months for evaluation with repeat CBC, comprehensive metabolic panel, LDH and phlebotomy if needed. He was advised to call immediately if he has any concerning symptoms in the interval. The patient voices understanding of current disease status and treatment options and is in agreement with the current care plan. All questions were answered. The patient knows to call the clinic with any problems, questions or concerns. We can certainly see the patient much sooner  if necessary.  I spent 10 minutes counseling the patient face to face. The total time spent in the appointment was 15 minutes.  Disclaimer: This note was dictated with voice recognition software. Similar sounding words can inadvertently be transcribed and may not be corrected upon review.

## 2018-05-25 NOTE — Progress Notes (Signed)
Dean Pratt presents today for phlebotomy per MD orders. Phlebotomy procedure started at 0955 and ended at 1012 501 grams removed. Patient observed for 30 minutes after procedure without any incident. Patient tolerated procedure well. IV needle removed intact.

## 2018-05-25 NOTE — Patient Instructions (Signed)

## 2018-05-27 ENCOUNTER — Telehealth: Payer: Self-pay | Admitting: Internal Medicine

## 2018-05-27 NOTE — Telephone Encounter (Signed)
Scheduled appt per 5/20 lso - reminder letter mailed with appt date and time

## 2018-07-26 ENCOUNTER — Other Ambulatory Visit: Payer: Self-pay

## 2018-08-12 ENCOUNTER — Other Ambulatory Visit: Payer: Self-pay | Admitting: Internal Medicine

## 2018-08-15 NOTE — Progress Notes (Addendum)
[facility] Note  08/16/2018     CHIEF COMPLAINT Patient presents for Retina Follow Up   HISTORY OF PRESENT ILLNESS: Dean Pratt is a 83 y.o. male who presents to the clinic today for:   HPI    Retina Follow Up    Patient presents with  Retinal Break/Detachment.  In right eye.  Severity is mild.  Duration of 3 months.  Since onset it is stable.  I, the attending physician,  performed the HPI with the patient and updated documentation appropriately.          Comments    Pt presents for 3 months follow up for retinal tear of right eye, pt states he is having trouble reading up close, he states he needs some reading glasses, but he cant find any in the correct power, he states he sees floaters every once in awhile, but not like he used to, pt is using systane, Restasis and olopatadine gtts       Last edited by Bernarda Caffey, MD on 08/17/2018  4:16 PM. (History)    pt states he is having a hard time reading up close, he states he feels like he needs new reading glasses  Referring physician: Debbra Riding, MD [address] [place],  [place] [address]  HISTORICAL INFORMATION:   Selected notes from the [facility] Referred by Dr. Wyatt Portela for concern of retinal tear LEE: 04.24.20 (S. Groat) [BCVA: OD: OS:] Ocular Hx- PMH-    CURRENT MEDICATIONS: Current Outpatient Medications (Ophthalmic Drugs)  Medication Sig  . Olopatadine HCl 0.2 % SOLN INSTILL 1 DROP INTO BOTH EYES TWICE A DAY   No current facility-administered medications for this visit.  (Ophthalmic Drugs)   Current Outpatient Medications (Other)  Medication Sig  . acetaminophen (TYLENOL) 325 MG tablet Take 650 mg by mouth every 6 (six) hours as needed.    Marland Kitchen allopurinol (ZYLOPRIM) 100 MG tablet Take 100 mg by mouth at bedtime.    . Ascorbic Acid (VITAMIN C) 1000 MG tablet Take 1,000 mg by mouth daily.   Marland Kitchen aspirin 81 MG tablet Take 81 mg by mouth daily.    .  clobetasol (TEMOVATE) 0.05 % external solution APPLY TO AFECTED AREAS (SCALP) TWICE A DAY AS NEEDED NOT FOR FACE,GROIN,UNDER ARMS  . diclofenac sodium (VOLTAREN) 1 % GEL APPLY 2-4 GRAMS 2-3 TIMES DAILY AS NEEDED FOR PAIN (Patient not taking: Reported on 05/25/2018)  . diltiazem (CARDIZEM CD) 240 MG 24 hr capsule TAKE 1 CAPSULE BY MOUTH EVERY DAY  . ibuprofen (ADVIL,MOTRIN) 200 MG tablet Take 200 mg by mouth every 6 (six) hours as needed.  Marland Kitchen LORazepam (ATIVAN) 1 MG tablet TAKE 1/2-1 TABLET TWICE DAILY AS NEEDED  . losartan (COZAAR) 25 MG tablet Take 1 tablet (25 mg total) by mouth daily.  . NON FORMULARY Stool softener 100 mg... Take 1 capsule by mouth once daily.   . rosuvastatin (CRESTOR) 10 MG tablet Take 10 mg by mouth daily.   Current Facility-Administered Medications (Other)  Medication Route  . 0.9 %  sodium chloride infusion Intravenous  . bupivacaine (MARCAINE) 0.5 % (with pres) injection 3 mL Other      REVIEW OF SYSTEMS: ROS    Positive for: Cardiovascular, Eyes, Respiratory   Negative for: Constitutional, Gastrointestinal, Neurological, Skin, Genitourinary, Musculoskeletal, HENT, Endocrine, Psychiatric, Allergic/Imm, Heme/Lymph   Last edited by Debbrah Alar, COT on 08/16/2018  8:06 AM. (History)  ALLERGIES Allergies  Allergen Reactions  . Ace Inhibitors Cough  . Warfarin And Related     Intercranial bleed    PAST MEDICAL HISTORY Past Medical History:  Diagnosis Date  . Anxiety   . Atrial flutter ([place])    s/p afib and atrial flutter ablation 04/09/09  . Blood transfusion without reported diagnosis   . BPH (benign prostatic hyperplasia)   . Cancer ([place])    basal cell CA removed  . Cataract    removed with lens implants   . Colitis, ischemic ([place])    secondary to ebolism from afib 1/10  . Constipation   . GERD (gastroesophageal reflux disease)   . Gout   . HTN (hypertension)   . Hyperlipidemia   . Intracranial bleed ([place]) 04/10/10   Right occipital  hematoma with a left homonymous hemianopsia   . Migraines   . Paroxysmal atrial fibrillation ([place])    s/p PVI 04/09/09 and 10/17/10  . Rosacea   . Stroke ([place]) 04/08/2010   hemorragic, anticoagulation stopped at that time  . Tuberculosis    s/p treatment 1964   Past Surgical History:  Procedure Laterality Date  . ablation  04/09/2009   s/p afib and atrial flutter ablation by [facility]  . ABLATION OF DYSRHYTHMIC FOCUS  10/15/09   repeat afib ablation by [facility]  . APPENDECTOMY    . CATARACT EXTRACTION    . CATARACT EXTRACTION, BILATERAL     with lens implants   . COLONOSCOPY    . CORNEAL LACERATION REPAIR    . EYE SURGERY    . INGUINAL HERNIA REPAIR    . POLYPECTOMY    . TONSILLECTOMY    . VASECTOMY      FAMILY HISTORY Family History  Problem Relation Age of Onset  . Liver cancer Father   . Prostate cancer Father   . Coronary artery disease Father   . COPD Mother   . Breast cancer Sister   . Colon cancer Neg Hx   . Rectal cancer Neg Hx   . Stomach cancer Neg Hx   . Esophageal cancer Neg Hx   . Colon polyps Neg Hx     SOCIAL HISTORY Social History   Tobacco Use  . Smoking status: Former Research scientist (life sciences)  . Smokeless tobacco: Never Used  Substance Use Topics  . Alcohol use: No  . Drug use: No         OPHTHALMIC EXAM:  Base Eye Exam    Visual Acuity (Snellen - Linear)      Right Left   Dist cc 20/20 -2 20/20 -2   Correction: Glasses       Tonometry (Tonopen, 8:12 AM)      Right Left   Pressure 19 17       Pupils      Dark Light Shape React APD   Right 3 2 Round Slow None   Left 3 2 Round Slow None       Visual Fields (Counting fingers)      Left Right    Full Full       Extraocular Movement      Right Left    Full, Ortho Full, Ortho       Neuro/Psych    Oriented x3: Yes   Mood/Affect: Normal       Dilation    Both eyes: 1.0% Mydriacyl, 2.5% Phenylephrine @ 8:12 AM        Slit Lamp and Fundus Exam    Slit Lamp  Exam      Right Left   Lids/Lashes  Dermatochalasis - upper lid, mild Meibomian gland dysfunction, mild Ptosis Dermatochalasis - upper lid, mild Meibomian gland dysfunction, mild Ptosis   Conjunctiva/Sclera mild temporal Pinguecula mild temporal Pinguecula   Cornea 1+ Punctate epithelial erosions, Arcus, Well healed ST cataract wounds 3+ Punctate epithelial erosions, mild EBMD, irregular epi, irregular tear film   Anterior Chamber deep and clear deep and clear   Iris Round and dilated Round and dilated   Lens PC IOL in good position with open PC PC IOL in good position with open PC   Vitreous Vitreous syneresis, trace pigment in anterior vitreous Vitreous syneresis       Fundus Exam      Right Left   Disc mild pallor, sharp rim mild pallor, sharp rim   C/D Ratio 0.3 0.2   Macula Flat, Blunted foveal reflex, Retinal pigment epithelial mottling, No heme or edema Flat, blunted foveal reflex, Retinal pigment epithelial mottling, No heme or edema   Vessels Vascular attenuation, mild Tortuous Vascular attenuation   Periphery retinal tear at 1030 with surrounding SRF/pigment -- good laser changes; remaining retina attached with scattered reticular and peripheral drusen Attached, scattered reticular and peripheral drusen          IMAGING AND PROCEDURES  Imaging and Procedures for @TODAY @  OCT, Retina - OU - Both Eyes       Right Eye Quality was good. Central Foveal Thickness: 287. Progression has been stable. Findings include normal foveal contour, no IRF, no SRF.   Left Eye Quality was good. Central Foveal Thickness: 276. Progression has been stable. Findings include normal foveal contour, no SRF, no IRF.   Notes *Images captured and stored on drive  Diagnosis / Impression:  NFP; no IRF/SRF OU  Clinical management:   See below  Abbreviations: NFP - Normal foveal profile. CME - cystoid macular edema. PED - pigment epithelial detachment. IRF - intraretinal fluid. SRF - subretinal fluid. EZ - ellipsoid zone. ERM -  epiretinal membrane. ORA - outer retinal atrophy. ORT - outer retinal tubulation. SRHM - subretinal hyper-reflective material                 ASSESSMENT/PLAN:    ICD-10-CM   1. Retinal tear of right eye  H33.311   2. Right retinal detachment  H33.21   3. Retinal edema  H35.81 OCT, Retina - OU - Both Eyes  4. Essential hypertension  I10   5. Hypertensive retinopathy of both eyes  H35.033   6. Pseudophakia of both eyes  Z96.1   7. Allergic conjunctivitis of both eyes  H10.13     1-3. Retinal tear with surrounding SRF and focal RD, OD     - The incidence, risk factors, and natural history of retinal tear was discussed with patient.    - Potential treatment options including laser retinopexy and cryotherapy discussed with patient  - tear located at 1030 with surrounding SRF and incomplete pigmented demarcation line  - s/p laser retinopexy OD (04.24.20) -- good laser changes in place  - clear from a retina standpoint for routine f/u w/ Dr. Zenia Resides  - f/u here prn  4,5. Hypertensive retinopathy OU  - discussed importance of tight BP control  - monitor  6. Pseudophakia OU  - s/p CE/IOL  - beautiful surgery, doing well  - Dr. Clent Jacks (2016/2017)  - monitor  7. Allergic Conjunctivitis OU  - stable  - management per Dr. Katy Fitch  Ophthalmic Meds Ordered this visit:  No orders of the defined types were placed in this encounter.      Return if symptoms worsen or fail to improve.  There are no Patient Instructions on file for this visit.   Explained the diagnoses, plan, and follow up with the patient and they expressed understanding.  Patient expressed understanding of the importance of proper follow up care.   This document serves as a record of services personally performed by Gardiner Sleeper, MD, PhD. It was created on their behalf by Ernest Mallick, OA, an ophthalmic assistant. The creation of this record is the provider's dictation and/or activities during the  visit.    Electronically signed by: Ernest Mallick, OA  08.10.2020 4:16 PM     Gardiner Sleeper, M.D., Ph.D. Diseases & Surgery of the Retina and Vitreous [facility]  I have reviewed the above documentation for accuracy and completeness, and I agree with the above. Gardiner Sleeper, M.D., Ph.D. 08/17/18 4:16 PM      Abbreviations: M myopia (nearsighted); A astigmatism; H hyperopia (farsighted); P presbyopia; Mrx spectacle prescription;  CTL contact lenses; OD right eye; OS left eye; OU both eyes  XT exotropia; ET esotropia; PEK punctate epithelial keratitis; PEE punctate epithelial erosions; DES dry eye syndrome; MGD meibomian gland dysfunction; ATs artificial tears; PFAT's preservative free artificial tears; [place] nuclear sclerotic cataract; PSC posterior subcapsular cataract; ERM epi-retinal membrane; PVD posterior vitreous detachment; RD retinal detachment; DM diabetes mellitus; DR diabetic retinopathy; NPDR non-proliferative diabetic retinopathy; PDR proliferative diabetic retinopathy; CSME clinically significant macular edema; DME diabetic macular edema; dbh dot blot hemorrhages; CWS cotton wool spot; POAG primary open angle glaucoma; C/D cup-to-disc ratio; HVF humphrey visual field; GVF goldmann visual field; OCT optical coherence tomography; IOP intraocular pressure; BRVO Branch retinal vein occlusion; CRVO central retinal vein occlusion; CRAO central retinal artery occlusion; BRAO branch retinal artery occlusion; RT retinal tear; SB scleral buckle; PPV pars plana vitrectomy; VH Vitreous hemorrhage; PRP panretinal laser photocoagulation; IVK intravitreal kenalog; VMT vitreomacular traction; MH Macular hole;  NVD neovascularization of the disc; NVE neovascularization elsewhere; AREDS age related eye disease study; ARMD age related macular degeneration; POAG primary open angle glaucoma; EBMD epithelial/anterior basement membrane dystrophy; ACIOL anterior chamber intraocular  lens; IOL intraocular lens; PCIOL posterior chamber intraocular lens; Phaco/IOL phacoemulsification with intraocular lens placement; [place] photorefractive keratectomy; LASIK laser assisted in situ keratomileusis; HTN hypertension; DM diabetes mellitus; COPD chronic obstructive pulmonary disease

## 2018-08-16 ENCOUNTER — Other Ambulatory Visit: Payer: Self-pay

## 2018-08-16 ENCOUNTER — Encounter (INDEPENDENT_AMBULATORY_CARE_PROVIDER_SITE_OTHER): Payer: Self-pay | Admitting: Ophthalmology

## 2018-08-16 ENCOUNTER — Ambulatory Visit (INDEPENDENT_AMBULATORY_CARE_PROVIDER_SITE_OTHER): Payer: Medicare HMO | Admitting: Ophthalmology

## 2018-08-16 DIAGNOSIS — H3581 Retinal edema: Secondary | ICD-10-CM | POA: Diagnosis not present

## 2018-08-16 DIAGNOSIS — I10 Essential (primary) hypertension: Secondary | ICD-10-CM

## 2018-08-16 DIAGNOSIS — H33311 Horseshoe tear of retina without detachment, right eye: Secondary | ICD-10-CM

## 2018-08-16 DIAGNOSIS — Z961 Presence of intraocular lens: Secondary | ICD-10-CM | POA: Diagnosis not present

## 2018-08-16 DIAGNOSIS — H3321 Serous retinal detachment, right eye: Secondary | ICD-10-CM | POA: Diagnosis not present

## 2018-08-16 DIAGNOSIS — H1013 Acute atopic conjunctivitis, bilateral: Secondary | ICD-10-CM | POA: Diagnosis not present

## 2018-08-16 DIAGNOSIS — H35033 Hypertensive retinopathy, bilateral: Secondary | ICD-10-CM

## 2018-08-24 ENCOUNTER — Telehealth: Payer: Self-pay

## 2018-08-24 NOTE — Telephone Encounter (Signed)
Left vm for pt regarding prescreening questions for appointment on 8/20. ° °

## 2018-08-25 ENCOUNTER — Other Ambulatory Visit: Payer: Self-pay

## 2018-08-25 ENCOUNTER — Inpatient Hospital Stay: Payer: Medicare HMO | Admitting: Internal Medicine

## 2018-08-25 ENCOUNTER — Inpatient Hospital Stay: Payer: Medicare HMO

## 2018-08-25 ENCOUNTER — Inpatient Hospital Stay: Payer: Medicare HMO | Attending: Internal Medicine

## 2018-08-25 ENCOUNTER — Encounter: Payer: Self-pay | Admitting: Internal Medicine

## 2018-08-25 VITALS — BP 127/76 | HR 65 | Temp 98.0°F | Resp 18

## 2018-08-25 VITALS — BP 131/82 | HR 62 | Temp 98.7°F | Resp 18 | Ht 70.0 in | Wt 176.5 lb

## 2018-08-25 DIAGNOSIS — E785 Hyperlipidemia, unspecified: Secondary | ICD-10-CM | POA: Insufficient documentation

## 2018-08-25 DIAGNOSIS — N400 Enlarged prostate without lower urinary tract symptoms: Secondary | ICD-10-CM | POA: Diagnosis not present

## 2018-08-25 DIAGNOSIS — F419 Anxiety disorder, unspecified: Secondary | ICD-10-CM | POA: Insufficient documentation

## 2018-08-25 DIAGNOSIS — R5383 Other fatigue: Secondary | ICD-10-CM | POA: Diagnosis not present

## 2018-08-25 DIAGNOSIS — I10 Essential (primary) hypertension: Secondary | ICD-10-CM | POA: Diagnosis not present

## 2018-08-25 DIAGNOSIS — D45 Polycythemia vera: Secondary | ICD-10-CM

## 2018-08-25 DIAGNOSIS — Z7982 Long term (current) use of aspirin: Secondary | ICD-10-CM | POA: Insufficient documentation

## 2018-08-25 DIAGNOSIS — K219 Gastro-esophageal reflux disease without esophagitis: Secondary | ICD-10-CM | POA: Diagnosis not present

## 2018-08-25 DIAGNOSIS — I480 Paroxysmal atrial fibrillation: Secondary | ICD-10-CM | POA: Diagnosis not present

## 2018-08-25 DIAGNOSIS — Z791 Long term (current) use of non-steroidal anti-inflammatories (NSAID): Secondary | ICD-10-CM | POA: Diagnosis not present

## 2018-08-25 DIAGNOSIS — R51 Headache: Secondary | ICD-10-CM | POA: Diagnosis not present

## 2018-08-25 DIAGNOSIS — Z8673 Personal history of transient ischemic attack (TIA), and cerebral infarction without residual deficits: Secondary | ICD-10-CM | POA: Insufficient documentation

## 2018-08-25 DIAGNOSIS — Z79899 Other long term (current) drug therapy: Secondary | ICD-10-CM | POA: Insufficient documentation

## 2018-08-25 DIAGNOSIS — R69 Illness, unspecified: Secondary | ICD-10-CM | POA: Diagnosis not present

## 2018-08-25 LAB — CBC WITH DIFFERENTIAL (CANCER CENTER ONLY)
Abs Immature Granulocytes: 0.02 10*3/uL (ref 0.00–0.07)
Basophils Absolute: 0.1 10*3/uL (ref 0.0–0.1)
Basophils Relative: 1 %
Eosinophils Absolute: 0.1 10*3/uL (ref 0.0–0.5)
Eosinophils Relative: 2 %
HCT: 54.2 % — ABNORMAL HIGH (ref 39.0–52.0)
Hemoglobin: 16.6 g/dL (ref 13.0–17.0)
Immature Granulocytes: 0 %
Lymphocytes Relative: 15 %
Lymphs Abs: 1.1 10*3/uL (ref 0.7–4.0)
MCH: 23 pg — ABNORMAL LOW (ref 26.0–34.0)
MCHC: 30.6 g/dL (ref 30.0–36.0)
MCV: 75.1 fL — ABNORMAL LOW (ref 80.0–100.0)
Monocytes Absolute: 0.5 10*3/uL (ref 0.1–1.0)
Monocytes Relative: 7 %
Neutro Abs: 5.8 10*3/uL (ref 1.7–7.7)
Neutrophils Relative %: 75 %
Platelet Count: 476 10*3/uL — ABNORMAL HIGH (ref 150–400)
RBC: 7.22 MIL/uL — ABNORMAL HIGH (ref 4.22–5.81)
RDW: 18.2 % — ABNORMAL HIGH (ref 11.5–15.5)
WBC Count: 7.7 10*3/uL (ref 4.0–10.5)
nRBC: 0 % (ref 0.0–0.2)

## 2018-08-25 LAB — CMP (CANCER CENTER ONLY)
ALT: 15 U/L (ref 0–44)
AST: 17 U/L (ref 15–41)
Albumin: 4.2 g/dL (ref 3.5–5.0)
Alkaline Phosphatase: 74 U/L (ref 38–126)
Anion gap: 10 (ref 5–15)
BUN: 12 mg/dL (ref 8–23)
CO2: 22 mmol/L (ref 22–32)
Calcium: 9.7 mg/dL (ref 8.9–10.3)
Chloride: 108 mmol/L (ref 98–111)
Creatinine: 1.14 mg/dL (ref 0.61–1.24)
GFR, Est AFR Am: >60 mL/min (ref >60)
GFR, Estimated: 60 mL/min — ABNORMAL LOW (ref >60)
Glucose, Bld: 110 mg/dL — ABNORMAL HIGH (ref 70–99)
Potassium: 4.1 mmol/L (ref 3.5–5.1)
Sodium: 140 mmol/L (ref 135–145)
Total Bilirubin: 0.7 mg/dL (ref 0.3–1.2)
Total Protein: 7.1 g/dL (ref 6.5–8.1)

## 2018-08-25 LAB — LACTATE DEHYDROGENASE: LDH: 148 U/L (ref 98–192)

## 2018-08-25 NOTE — Patient Instructions (Signed)
Therapeutic Phlebotomy, Care After This sheet gives you information about how to care for yourself after your procedure. Your health care provider may also give you more specific instructions. If you have problems or questions, contact your health care provider. What can I expect after the procedure? After the procedure, it is common to have:  Light-headedness or dizziness. You may feel faint.  Nausea.  Tiredness (fatigue). Follow these instructions at home: Eating and drinking  Be sure to eat well-balanced meals for the next 24 hours.  Drink enough fluid to keep your urine pale yellow.  Avoid drinking alcohol on the day that you had the procedure. Activity   Return to your normal activities as told by your health care provider. Most people can go back to their normal activities right away.  Avoid activities that take a lot of effort for about 5 hours after the procedure. Athletes should avoid strenuous exercise for at least 12 hours.  Avoid heavy lifting or pulling for about 5 hours after the procedure. Do not lift anything that is heavier than 10 lb (4.5 kg).  Change positions slowly for the remainder of the day. This will help to prevent light-headedness or fainting.  If you feel light-headed, lie down until the feeling goes away. Needle insertion site care   Keep your bandage (dressing) dry. You can remove the bandage after about 5 hours or as told by your health care provider.  If you have bleeding from the needle insertion site, raise (elevate) your arm and press firmly on the site until the bleeding stops.  If you have bruising at the site, apply ice to the area: ? Remove the dressing. ? Put ice in a plastic bag. ? Place a towel between your skin and the bag. ? Leave the ice on for 20 minutes, 2-3 times a day for the first 24 hours.  If the swelling does not go away after 24 hours, apply a warm, moist cloth (warm compress) to the area for 20 minutes, 2-3 times a day.  General instructions  Do not use any products that contain nicotine or tobacco, such as cigarettes and e-cigarettes, for at least 30 minutes after the procedure.  Keep all follow-up visits as told by your health care provider. This is important. You may need to continue having regular therapeutic phlebotomy treatments as directed. Contact a health care provider if you:  Have redness, swelling, or pain at the needle insertion site.  Have fluid or blood coming from the needle insertion site.  Have pus or a bad smell coming from the needle insertion site.  Notice that the needle insertion site feels warm to the touch.  Feel light-headed, dizzy, or nauseous, and the feeling does not go away.  Have new bruising at the needle insertion site.  Feel weaker than normal.  Have a fever or chills. Get help right away if:  You faint.  You have chest pain.  You have trouble breathing.  You have severe nausea or vomiting. Summary  After the procedure, it is common to have some light-headedness, dizziness, nausea, or tiredness (fatigue).  Be sure to eat well-balanced meals for the next 24 hours. Drink enough fluid to keep your urine pale yellow.  Return to your normal activities as told by your health care provider.  Keep all follow-up visits as told by your health care provider. You may need to continue having regular therapeutic phlebotomy treatments as directed. This information is not intended to replace advice given to you   by your health care provider. Make sure you discuss any questions you have with your health care provider. Document Released: 05/26/2010 Document Revised: 01/08/2017 Document Reviewed: 01/07/2017 Elsevier Patient Education  2020 Elsevier Inc.  Coronavirus (COVID-19) Are you at risk?  Are you at risk for the Coronavirus (COVID-19)?  To be considered HIGH RISK for Coronavirus (COVID-19), you have to meet the following criteria:  . Traveled to [place], [place],  [place], [place] or [place]; or in the [place] to [place], [place], [place], or [place]; and have fever, cough, and shortness of breath within the last 2 weeks of travel OR . Been in close contact with a person diagnosed with COVID-19 within the last 2 weeks and have fever, cough, and shortness of breath . IF YOU DO NOT MEET THESE CRITERIA, YOU ARE CONSIDERED LOW RISK FOR COVID-19.  What to do if you are HIGH RISK for COVID-19?  . If you are having a medical emergency, call 911. . Seek medical care right away. Before you go to a doctor's office, urgent care or emergency department, call ahead and tell them about your recent travel, contact with someone diagnosed with COVID-19, and your symptoms. You should receive instructions from your physician's office regarding next steps of care.  . When you arrive at healthcare provider, tell the healthcare staff immediately you have returned from visiting [place], [place], [place], [place] or [place]; or traveled in the [place] to [place], [place], [place], or [place]; in the last two weeks or you have been in close contact with a person diagnosed with COVID-19 in the last 2 weeks.   . Tell the health care staff about your symptoms: fever, cough and shortness of breath. . After you have been seen by a medical provider, you will be either: o Tested for (COVID-19) and discharged home on quarantine except to seek medical care if symptoms worsen, and asked to  - Stay home and avoid contact with others until you get your results (4-5 days)  - Avoid travel on public transportation if possible (such as bus, train, or airplane) or o Sent to the Emergency Department by EMS for evaluation, COVID-19 testing, and possible admission depending on your condition and test results.  What to do if you are LOW RISK for COVID-19?  Reduce your risk of any infection by using the same precautions used for avoiding the common cold or flu:  . Wash your  hands often with soap and warm water for at least 20 seconds.  If soap and water are not readily available, use an alcohol-based hand sanitizer with at least 60% alcohol.  . If coughing or sneezing, cover your mouth and nose by coughing or sneezing into the elbow areas of your shirt or coat, into a tissue or into your sleeve (not your hands). . Avoid shaking hands with others and consider head nods or verbal greetings only. . Avoid touching your eyes, nose, or mouth with unwashed hands.  . Avoid close contact with people who are sick. . Avoid places or events with large numbers of people in one location, like concerts or sporting events. . Carefully consider travel plans you have or are making. . If you are planning any travel outside or inside the [place], visit the CDC's Travelers' Health webpage for the latest health notices. . If you have some symptoms but not all symptoms, continue to monitor at home and seek medical attention if your symptoms worsen. . If you are   having a medical emergency, call 911.   [place] / e-Visit: [email or web address]         [facility]: [facility]: [phone]                   [facility]: [phone]

## 2018-08-25 NOTE — Progress Notes (Signed)
Dean Pratt presents today for phlebotomy per MD orders. Phlebotomy procedure started at 1125, 18G phlebotomy IV kit inserted to LAC, and ended at 1130. 500 grams removed. Patient tolerated procedure well. Food and drinks provided. Patient observed for 30 minutes after procedure without any incident.

## 2018-08-25 NOTE — Progress Notes (Signed)
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    PRIOR THERAPY: None  CURRENT THERAPY: Phlebotomy on as-needed basis.  INTERVAL HISTORY: Dean Pratt 83 y.o. male returns to the clinic today for follow-up visit the patient is feeling fine today with no concerning complaints except for intermittent fatigue as well as headache.  He denied having any chest pain, shortness of breath, cough or hemoptysis.  He denied having any fever or chills.  He has no nausea, vomiting, diarrhea or constipation.  He denied having any visual changes.  He has no weight loss.  He is here today for evaluation and repeat blood work.  MEDICAL HISTORY: Past Medical History:  Diagnosis Date  . Anxiety   . Atrial flutter ([place])    s/p afib and atrial flutter ablation 04/09/09  . Blood transfusion without reported diagnosis   . BPH (benign prostatic hyperplasia)   . Cancer ([place])    basal cell CA removed  . Cataract    removed with lens implants   . Colitis, ischemic ([place])    secondary to ebolism from afib 1/10  . Constipation   . GERD (gastroesophageal reflux disease)   . Gout   . HTN (hypertension)   . Hyperlipidemia   . Intracranial bleed ([place]) 04/10/10   Right occipital hematoma with a left homonymous hemianopsia   . Migraines   . Paroxysmal atrial fibrillation ([place])    s/p PVI 04/09/09 and 10/17/10  . Rosacea   . Stroke ([place]) 04/08/2010   hemorragic, anticoagulation stopped at that time  . Tuberculosis    s/p treatment 1964    ALLERGIES:  is allergic to ace inhibitors and warfarin and related.  MEDICATIONS:  Current Outpatient Medications  Medication Sig Dispense Refill  . acetaminophen (TYLENOL) 325 MG tablet Take 650 mg by mouth every 6 (six) hours as needed.      Marland Kitchen allopurinol (ZYLOPRIM) 100 MG tablet Take 100 mg by mouth at  bedtime.      . Ascorbic Acid (VITAMIN C) 1000 MG tablet Take 1,000 mg by mouth daily.     Marland Kitchen aspirin 81 MG tablet Take 81 mg by mouth daily.      . clobetasol (TEMOVATE) 0.05 % external solution APPLY TO AFECTED AREAS (SCALP) TWICE A DAY AS NEEDED NOT FOR FACE,GROIN,UNDER ARMS    . diclofenac sodium (VOLTAREN) 1 % GEL APPLY 2-4 GRAMS 2-3 TIMES DAILY AS NEEDED FOR PAIN (Patient not taking: Reported on 05/25/2018) 100 g 0  . diltiazem (CARDIZEM CD) 240 MG 24 hr capsule TAKE 1 CAPSULE BY MOUTH EVERY DAY 90 capsule 0  . ibuprofen (ADVIL,MOTRIN) 200 MG tablet Take 200 mg by mouth every 6 (six) hours as needed.    Marland Kitchen LORazepam (ATIVAN) 1 MG tablet TAKE 1/2-1 TABLET TWICE DAILY AS NEEDED  3  . losartan (COZAAR) 25 MG tablet Take 1 tablet (25 mg total) by mouth daily. 90 tablet 3  . NON FORMULARY Stool softener 100 mg... Take 1 capsule by mouth once daily.     . Olopatadine HCl 0.2 % SOLN INSTILL 1 DROP INTO BOTH EYES TWICE A DAY    . rosuvastatin (CRESTOR) 10 MG tablet Take 10 mg by mouth daily.  2   Current Facility-Administered Medications  Medication Dose Route Frequency Provider Last Rate Last Dose  . 0.9 %  sodium chloride infusion  500 mL Intravenous Once Irene Shipper, MD      . bupivacaine (MARCAINE) 0.5 % (with pres) injection 3 mL  3 mL Other Once Magnus Sinning, MD        SURGICAL HISTORY:  Past Surgical History:  Procedure Laterality Date  . ablation  04/09/2009   s/p afib and atrial flutter ablation by JA  . ABLATION OF DYSRHYTHMIC FOCUS  10/15/09   repeat afib ablation by JA  . APPENDECTOMY    . CATARACT EXTRACTION    . CATARACT EXTRACTION, BILATERAL     with lens implants   . COLONOSCOPY    . CORNEAL LACERATION REPAIR    . EYE SURGERY    . INGUINAL HERNIA REPAIR    . POLYPECTOMY    . TONSILLECTOMY    . VASECTOMY      REVIEW OF SYSTEMS:  A comprehensive review of systems was negative except for: Constitutional: positive for fatigue Neurological: positive for headaches    PHYSICAL EXAMINATION: General appearance: alert, cooperative and no distress Head: Normocephalic, without obvious abnormality, atraumatic Neck: no adenopathy, no JVD, supple, symmetrical, trachea midline and thyroid not enlarged, symmetric, no tenderness/mass/nodules Lymph nodes: Cervical, supraclavicular, and axillary nodes normal. Resp: clear to auscultation bilaterally Back: symmetric, no curvature. ROM normal. No CVA tenderness. Cardio: regular rate and rhythm, S1, S2 normal, no murmur, click, rub or gallop GI: soft, non-tender; bowel sounds normal; no masses,  no organomegaly Extremities: extremities normal, atraumatic, no cyanosis or edema  ECOG PERFORMANCE STATUS: 1 - Symptomatic but completely ambulatory  Blood pressure 131/82, pulse 62, temperature 98.7 F (37.1 C), temperature source Oral, resp. rate 18, height 5\' 10"  (1.778 m), weight 176 lb 8 oz (80.1 kg), SpO2 98 %.  LABORATORY DATA: Lab Results  Component Value Date   WBC 7.7 08/25/2018   HGB 16.6 08/25/2018   HCT 54.2 (H) 08/25/2018   MCV 75.1 (L) 08/25/2018   PLT 476 (H) 08/25/2018      Chemistry      Component Value Date/Time   NA 144 05/25/2018 0812   K 4.0 05/25/2018 0812   CL 110 05/25/2018 0812   CO2 21 (L) 05/25/2018 0812   BUN 10 05/25/2018 0812   CREATININE 1.13 05/25/2018 0812      Component Value Date/Time   CALCIUM 9.1 05/25/2018 0812   ALKPHOS 61 05/25/2018 0812   AST 19 05/25/2018 0812   ALT 19 05/25/2018 0812   BILITOT 0.6 05/25/2018 0812       RADIOGRAPHIC STUDIES: No results found.  ASSESSMENT AND PLAN: This is a very pleasant 83 years old white male with recently diagnosed polycythemia vera with positive Jak 2 mutation.  The patient is also currently on hormonal therapy for androgen deficiency. The patient is doing well today except for mild fatigue and intermittent headache. Repeat CBC today showed hematocrit of 54.2%. I recommended for him to proceed with phlebotomy today to keep  his hematocrit around 45%. I will see him back for follow-up visit in 3 months for evaluation with repeat lab work and phlebotomy if needed. He was advised to call immediately if he has any concerning symptoms in the interval. The patient voices understanding of current disease status and treatment options and is in agreement with the current care plan. All questions were answered. The patient knows to call the clinic with any problems, questions or concerns. We can certainly see the patient much sooner if necessary.  I spent 10 minutes counseling the  patient face to face. The total time spent in the appointment was 15 minutes.  Disclaimer: This note was dictated with voice recognition software. Similar sounding words can inadvertently be transcribed and may not be corrected upon review.

## 2018-08-26 ENCOUNTER — Telehealth: Payer: Self-pay | Admitting: Internal Medicine

## 2018-08-26 NOTE — Telephone Encounter (Signed)
Mailed appt per 8/20 los .

## 2018-09-06 DIAGNOSIS — R69 Illness, unspecified: Secondary | ICD-10-CM | POA: Diagnosis not present

## 2018-09-19 DIAGNOSIS — D751 Secondary polycythemia: Secondary | ICD-10-CM | POA: Diagnosis not present

## 2018-09-19 DIAGNOSIS — M109 Gout, unspecified: Secondary | ICD-10-CM | POA: Diagnosis not present

## 2018-09-19 DIAGNOSIS — Z20828 Contact with and (suspected) exposure to other viral communicable diseases: Secondary | ICD-10-CM | POA: Diagnosis not present

## 2018-09-19 DIAGNOSIS — H40009 Preglaucoma, unspecified, unspecified eye: Secondary | ICD-10-CM | POA: Diagnosis not present

## 2018-09-19 DIAGNOSIS — D692 Other nonthrombocytopenic purpura: Secondary | ICD-10-CM | POA: Diagnosis not present

## 2018-09-19 DIAGNOSIS — I129 Hypertensive chronic kidney disease with stage 1 through stage 4 chronic kidney disease, or unspecified chronic kidney disease: Secondary | ICD-10-CM | POA: Diagnosis not present

## 2018-09-19 DIAGNOSIS — I480 Paroxysmal atrial fibrillation: Secondary | ICD-10-CM | POA: Diagnosis not present

## 2018-09-19 DIAGNOSIS — N182 Chronic kidney disease, stage 2 (mild): Secondary | ICD-10-CM | POA: Diagnosis not present

## 2018-09-19 DIAGNOSIS — E039 Hypothyroidism, unspecified: Secondary | ICD-10-CM | POA: Diagnosis not present

## 2018-09-19 DIAGNOSIS — R69 Illness, unspecified: Secondary | ICD-10-CM | POA: Diagnosis not present

## 2018-09-19 DIAGNOSIS — E785 Hyperlipidemia, unspecified: Secondary | ICD-10-CM | POA: Diagnosis not present

## 2018-10-27 DIAGNOSIS — H04123 Dry eye syndrome of bilateral lacrimal glands: Secondary | ICD-10-CM | POA: Diagnosis not present

## 2018-10-27 DIAGNOSIS — H52203 Unspecified astigmatism, bilateral: Secondary | ICD-10-CM | POA: Diagnosis not present

## 2018-10-27 DIAGNOSIS — H524 Presbyopia: Secondary | ICD-10-CM | POA: Diagnosis not present

## 2018-10-27 DIAGNOSIS — H1045 Other chronic allergic conjunctivitis: Secondary | ICD-10-CM | POA: Diagnosis not present

## 2018-10-27 DIAGNOSIS — Z961 Presence of intraocular lens: Secondary | ICD-10-CM | POA: Diagnosis not present

## 2018-10-27 DIAGNOSIS — H33011 Retinal detachment with single break, right eye: Secondary | ICD-10-CM | POA: Diagnosis not present

## 2018-10-27 DIAGNOSIS — L719 Rosacea, unspecified: Secondary | ICD-10-CM | POA: Diagnosis not present

## 2018-10-27 DIAGNOSIS — H5211 Myopia, right eye: Secondary | ICD-10-CM | POA: Diagnosis not present

## 2018-11-09 DIAGNOSIS — C44319 Basal cell carcinoma of skin of other parts of face: Secondary | ICD-10-CM | POA: Diagnosis not present

## 2018-11-09 DIAGNOSIS — L570 Actinic keratosis: Secondary | ICD-10-CM | POA: Diagnosis not present

## 2018-11-09 DIAGNOSIS — D045 Carcinoma in situ of skin of trunk: Secondary | ICD-10-CM | POA: Diagnosis not present

## 2018-11-09 DIAGNOSIS — X32XXXD Exposure to sunlight, subsequent encounter: Secondary | ICD-10-CM | POA: Diagnosis not present

## 2018-11-09 DIAGNOSIS — D225 Melanocytic nevi of trunk: Secondary | ICD-10-CM | POA: Diagnosis not present

## 2018-11-24 ENCOUNTER — Inpatient Hospital Stay: Payer: Medicare HMO | Attending: Internal Medicine

## 2018-11-24 ENCOUNTER — Inpatient Hospital Stay: Payer: Medicare HMO

## 2018-11-24 ENCOUNTER — Encounter: Payer: Self-pay | Admitting: Internal Medicine

## 2018-11-24 ENCOUNTER — Inpatient Hospital Stay: Payer: Medicare HMO | Admitting: Internal Medicine

## 2018-11-24 ENCOUNTER — Other Ambulatory Visit: Payer: Self-pay

## 2018-11-24 VITALS — BP 141/84 | HR 89 | Temp 98.5°F | Resp 17 | Ht 70.0 in | Wt 167.4 lb

## 2018-11-24 DIAGNOSIS — Z7982 Long term (current) use of aspirin: Secondary | ICD-10-CM | POA: Insufficient documentation

## 2018-11-24 DIAGNOSIS — K219 Gastro-esophageal reflux disease without esophagitis: Secondary | ICD-10-CM | POA: Diagnosis not present

## 2018-11-24 DIAGNOSIS — Z8673 Personal history of transient ischemic attack (TIA), and cerebral infarction without residual deficits: Secondary | ICD-10-CM | POA: Diagnosis not present

## 2018-11-24 DIAGNOSIS — D45 Polycythemia vera: Secondary | ICD-10-CM

## 2018-11-24 DIAGNOSIS — I480 Paroxysmal atrial fibrillation: Secondary | ICD-10-CM | POA: Insufficient documentation

## 2018-11-24 DIAGNOSIS — N400 Enlarged prostate without lower urinary tract symptoms: Secondary | ICD-10-CM | POA: Diagnosis not present

## 2018-11-24 DIAGNOSIS — Z791 Long term (current) use of non-steroidal anti-inflammatories (NSAID): Secondary | ICD-10-CM | POA: Diagnosis not present

## 2018-11-24 DIAGNOSIS — Z79899 Other long term (current) drug therapy: Secondary | ICD-10-CM | POA: Diagnosis not present

## 2018-11-24 DIAGNOSIS — R69 Illness, unspecified: Secondary | ICD-10-CM | POA: Diagnosis not present

## 2018-11-24 DIAGNOSIS — E785 Hyperlipidemia, unspecified: Secondary | ICD-10-CM | POA: Diagnosis not present

## 2018-11-24 DIAGNOSIS — I10 Essential (primary) hypertension: Secondary | ICD-10-CM | POA: Diagnosis not present

## 2018-11-24 DIAGNOSIS — F419 Anxiety disorder, unspecified: Secondary | ICD-10-CM | POA: Insufficient documentation

## 2018-11-24 LAB — CBC WITH DIFFERENTIAL (CANCER CENTER ONLY)
Abs Immature Granulocytes: 0.02 10*3/uL (ref 0.00–0.07)
Basophils Absolute: 0.1 10*3/uL (ref 0.0–0.1)
Basophils Relative: 2 %
Eosinophils Absolute: 0.1 10*3/uL (ref 0.0–0.5)
Eosinophils Relative: 2 %
HCT: 54 % — ABNORMAL HIGH (ref 39.0–52.0)
Hemoglobin: 16.9 g/dL (ref 13.0–17.0)
Immature Granulocytes: 0 %
Lymphocytes Relative: 15 %
Lymphs Abs: 1.2 10*3/uL (ref 0.7–4.0)
MCH: 23.3 pg — ABNORMAL LOW (ref 26.0–34.0)
MCHC: 31.3 g/dL (ref 30.0–36.0)
MCV: 74.4 fL — ABNORMAL LOW (ref 80.0–100.0)
Monocytes Absolute: 0.6 10*3/uL (ref 0.1–1.0)
Monocytes Relative: 8 %
Neutro Abs: 5.9 10*3/uL (ref 1.7–7.7)
Neutrophils Relative %: 73 %
Platelet Count: 401 10*3/uL — ABNORMAL HIGH (ref 150–400)
RBC: 7.26 MIL/uL — ABNORMAL HIGH (ref 4.22–5.81)
RDW: 17.9 % — ABNORMAL HIGH (ref 11.5–15.5)
WBC Count: 8 10*3/uL (ref 4.0–10.5)
nRBC: 0 % (ref 0.0–0.2)

## 2018-11-24 LAB — CMP (CANCER CENTER ONLY)
ALT: 17 U/L (ref 0–44)
AST: 20 U/L (ref 15–41)
Albumin: 4.1 g/dL (ref 3.5–5.0)
Alkaline Phosphatase: 68 U/L (ref 38–126)
Anion gap: 11 (ref 5–15)
BUN: 15 mg/dL (ref 8–23)
CO2: 20 mmol/L — ABNORMAL LOW (ref 22–32)
Calcium: 9.3 mg/dL (ref 8.9–10.3)
Chloride: 109 mmol/L (ref 98–111)
Creatinine: 1.12 mg/dL (ref 0.61–1.24)
GFR, Est AFR Am: >60 mL/min (ref >60)
GFR, Estimated: >60 mL/min (ref >60)
Glucose, Bld: 101 mg/dL — ABNORMAL HIGH (ref 70–99)
Potassium: 4.4 mmol/L (ref 3.5–5.1)
Sodium: 140 mmol/L (ref 135–145)
Total Bilirubin: 0.6 mg/dL (ref 0.3–1.2)
Total Protein: 6.8 g/dL (ref 6.5–8.1)

## 2018-11-24 NOTE — Progress Notes (Signed)
Dean Pratt presents today for phlebotomy per MD orders. 16 g. In L antecubital. Phlebotomy procedure started at 1118 and ended at 1125. 530 grams removed.   Patient observed for 30 minutes after procedure without any incident. Patient tolerated procedure well. Had food & drink. IV needle removed intact.

## 2018-11-24 NOTE — Patient Instructions (Signed)
Therapeutic Phlebotomy, Care After This sheet gives you information about how to care for yourself after your procedure. Your health care provider may also give you more specific instructions. If you have problems or questions, contact your health care provider. What can I expect after the procedure? After the procedure, it is common to have:  Light-headedness or dizziness. You may feel faint.  Nausea.  Tiredness (fatigue). Follow these instructions at home: Eating and drinking  Be sure to eat well-balanced meals for the next 24 hours.  Drink enough fluid to keep your urine pale yellow.  Avoid drinking alcohol on the day that you had the procedure. Activity   Return to your normal activities as told by your health care provider. Most people can go back to their normal activities right away.  Avoid activities that take a lot of effort for about 5 hours after the procedure. Athletes should avoid strenuous exercise for at least 12 hours.  Avoid heavy lifting or pulling for about 5 hours after the procedure. Do not lift anything that is heavier than 10 lb (4.5 kg).  Change positions slowly for the remainder of the day. This will help to prevent light-headedness or fainting.  If you feel light-headed, lie down until the feeling goes away. Needle insertion site care   Keep your bandage (dressing) dry. You can remove the bandage after about 5 hours or as told by your health care provider.  If you have bleeding from the needle insertion site, raise (elevate) your arm and press firmly on the site until the bleeding stops.  If you have bruising at the site, apply ice to the area: ? Remove the dressing. ? Put ice in a plastic bag. ? Place a towel between your skin and the bag. ? Leave the ice on for 20 minutes, 2-3 times a day for the first 24 hours.  If the swelling does not go away after 24 hours, apply a warm, moist cloth (warm compress) to the area for 20 minutes, 2-3 times a day.  General instructions  Do not use any products that contain nicotine or tobacco, such as cigarettes and e-cigarettes, for at least 30 minutes after the procedure.  Keep all follow-up visits as told by your health care provider. This is important. You may need to continue having regular therapeutic phlebotomy treatments as directed. Contact a health care provider if you:  Have redness, swelling, or pain at the needle insertion site.  Have fluid or blood coming from the needle insertion site.  Have pus or a bad smell coming from the needle insertion site.  Notice that the needle insertion site feels warm to the touch.  Feel light-headed, dizzy, or nauseous, and the feeling does not go away.  Have new bruising at the needle insertion site.  Feel weaker than normal.  Have a fever or chills. Get help right away if:  You faint.  You have chest pain.  You have trouble breathing.  You have severe nausea or vomiting. Summary  After the procedure, it is common to have some light-headedness, dizziness, nausea, or tiredness (fatigue).  Be sure to eat well-balanced meals for the next 24 hours. Drink enough fluid to keep your urine pale yellow.  Return to your normal activities as told by your health care provider.  Keep all follow-up visits as told by your health care provider. You may need to continue having regular therapeutic phlebotomy treatments as directed. This information is not intended to replace advice given to you   by your health care provider. Make sure you discuss any questions you have with your health care provider. Document Released: 05/26/2010 Document Revised: 01/08/2017 Document Reviewed: 01/07/2017 Elsevier Patient Education  2020 Elsevier Inc.  Coronavirus (COVID-19) Are you at risk?  Are you at risk for the Coronavirus (COVID-19)?  To be considered HIGH RISK for Coronavirus (COVID-19), you have to meet the following criteria:  . Traveled to [place], [place],  [place], [place] or [place]; or in the [place] to [place], [place], [place], or [place]; and have fever, cough, and shortness of breath within the last 2 weeks of travel OR . Been in close contact with a person diagnosed with COVID-19 within the last 2 weeks and have fever, cough, and shortness of breath . IF YOU DO NOT MEET THESE CRITERIA, YOU ARE CONSIDERED LOW RISK FOR COVID-19.  What to do if you are HIGH RISK for COVID-19?  . If you are having a medical emergency, call 911. . Seek medical care right away. Before you go to a doctor's office, urgent care or emergency department, call ahead and tell them about your recent travel, contact with someone diagnosed with COVID-19, and your symptoms. You should receive instructions from your physician's office regarding next steps of care.  . When you arrive at healthcare provider, tell the healthcare staff immediately you have returned from visiting [place], [place], [place], [place] or [place]; or traveled in the [place] to [place], [place], [place], or [place]; in the last two weeks or you have been in close contact with a person diagnosed with COVID-19 in the last 2 weeks.   . Tell the health care staff about your symptoms: fever, cough and shortness of breath. . After you have been seen by a medical provider, you will be either: o Tested for (COVID-19) and discharged home on quarantine except to seek medical care if symptoms worsen, and asked to  - Stay home and avoid contact with others until you get your results (4-5 days)  - Avoid travel on public transportation if possible (such as bus, train, or airplane) or o Sent to the Emergency Department by EMS for evaluation, COVID-19 testing, and possible admission depending on your condition and test results.  What to do if you are LOW RISK for COVID-19?  Reduce your risk of any infection by using the same precautions used for avoiding the common cold or flu:  . Wash your  hands often with soap and warm water for at least 20 seconds.  If soap and water are not readily available, use an alcohol-based hand sanitizer with at least 60% alcohol.  . If coughing or sneezing, cover your mouth and nose by coughing or sneezing into the elbow areas of your shirt or coat, into a tissue or into your sleeve (not your hands). . Avoid shaking hands with others and consider head nods or verbal greetings only. . Avoid touching your eyes, nose, or mouth with unwashed hands.  . Avoid close contact with people who are sick. . Avoid places or events with large numbers of people in one location, like concerts or sporting events. . Carefully consider travel plans you have or are making. . If you are planning any travel outside or inside the [place], visit the CDC's Travelers' Health webpage for the latest health notices. . If you have some symptoms but not all symptoms, continue to monitor at home and seek medical attention if your symptoms worsen. . If you are   having a medical emergency, call 911.   [place] / e-Visit: [email or web address]         [facility]: [facility]: [phone]                   [facility]: [phone]

## 2018-11-24 NOTE — Progress Notes (Signed)
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    PRIOR THERAPY: None  CURRENT THERAPY: Phlebotomy on as-needed basis.  INTERVAL HISTORY: Dean Pratt 83 y.o. male returns to the clinic today for follow-up visit.  The patient is feeling fine today with no concerning complaints.  He denied having any chest pain, shortness of breath, cough or hemoptysis.  He denied having any fever or chills.  He has no nausea, vomiting, diarrhea or constipation.  He has no headache or visual changes.  He has no bleeding issues.  Is here today for evaluation and repeat blood work as well as phlebotomy if needed.  MEDICAL HISTORY: Past Medical History:  Diagnosis Date  . Anxiety   . Atrial flutter ([place])    s/p afib and atrial flutter ablation 04/09/09  . Blood transfusion without reported diagnosis   . BPH (benign prostatic hyperplasia)   . Cancer ([place])    basal cell CA removed  . Cataract    removed with lens implants   . Colitis, ischemic ([place])    secondary to ebolism from afib 1/10  . Constipation   . GERD (gastroesophageal reflux disease)   . Gout   . HTN (hypertension)   . Hyperlipidemia   . Intracranial bleed ([place]) 04/10/10   Right occipital hematoma with a left homonymous hemianopsia   . Migraines   . Paroxysmal atrial fibrillation ([place])    s/p PVI 04/09/09 and 10/17/10  . Rosacea   . Stroke ([place]) 04/08/2010   hemorragic, anticoagulation stopped at that time  . Tuberculosis    s/p treatment 1964    ALLERGIES:  is allergic to ace inhibitors and warfarin and related.  MEDICATIONS:  Current Outpatient Medications  Medication Sig Dispense Refill  . acetaminophen (TYLENOL) 325 MG tablet Take 650 mg by mouth every 6 (six) hours as needed.      Marland Kitchen allopurinol (ZYLOPRIM) 100 MG tablet Take 100 mg by mouth at bedtime.      .  Ascorbic Acid (VITAMIN C) 1000 MG tablet Take 1,000 mg by mouth daily.     Marland Kitchen aspirin 81 MG tablet Take 81 mg by mouth daily.      . clobetasol (TEMOVATE) 0.05 % external solution APPLY TO AFECTED AREAS (SCALP) TWICE A DAY AS NEEDED NOT FOR FACE,GROIN,UNDER ARMS    . diclofenac sodium (VOLTAREN) 1 % GEL APPLY 2-4 GRAMS 2-3 TIMES DAILY AS NEEDED FOR PAIN 100 g 0  . diltiazem (CARDIZEM CD) 240 MG 24 hr capsule TAKE 1 CAPSULE BY MOUTH EVERY DAY 90 capsule 0  . ibuprofen (ADVIL,MOTRIN) 200 MG tablet Take 200 mg by mouth every 6 (six) hours as needed.    Marland Kitchen LORazepam (ATIVAN) 1 MG tablet TAKE 1/2-1 TABLET TWICE DAILY AS NEEDED  3  . losartan (COZAAR) 25 MG tablet Take 1 tablet (25 mg total) by mouth daily. 90 tablet 3  . NON FORMULARY Stool softener 100 mg... Take 1 capsule by mouth once daily.     . Olopatadine HCl 0.2 % SOLN INSTILL 1 DROP INTO BOTH EYES TWICE A DAY    . rosuvastatin (CRESTOR) 10 MG tablet Take 10 mg by mouth daily.  2  . fluticasone (CUTIVATE) 0.05 % cream Apply 1 application topically daily.     Current Facility-Administered Medications  Medication Dose Route Frequency Provider Last Rate  Last Dose  . 0.9 %  sodium chloride infusion  500 mL Intravenous Once Irene Shipper, MD      . bupivacaine (MARCAINE) 0.5 % (with pres) injection 3 mL  3 mL Other Once Magnus Sinning, MD        SURGICAL HISTORY:  Past Surgical History:  Procedure Laterality Date  . ablation  04/09/2009   s/p afib and atrial flutter ablation by [facility]  . ABLATION OF DYSRHYTHMIC FOCUS  10/15/09   repeat afib ablation by JA  . APPENDECTOMY    . CATARACT EXTRACTION    . CATARACT EXTRACTION, BILATERAL     with lens implants   . COLONOSCOPY    . CORNEAL LACERATION REPAIR    . EYE SURGERY    . INGUINAL HERNIA REPAIR    . POLYPECTOMY    . TONSILLECTOMY    . VASECTOMY      REVIEW OF SYSTEMS:  A comprehensive review of systems was negative except for: Constitutional: positive for fatigue   PHYSICAL  EXAMINATION: General appearance: alert, cooperative and no distress Head: Normocephalic, without obvious abnormality, atraumatic Neck: no adenopathy, no JVD, supple, symmetrical, trachea midline and thyroid not enlarged, symmetric, no tenderness/mass/nodules Lymph nodes: Cervical, supraclavicular, and axillary nodes normal. Resp: clear to auscultation bilaterally Back: symmetric, no curvature. ROM normal. No CVA tenderness. Cardio: regular rate and rhythm, S1, S2 normal, no murmur, click, rub or gallop GI: soft, non-tender; bowel sounds normal; no masses,  no organomegaly Extremities: extremities normal, atraumatic, no cyanosis or edema  ECOG PERFORMANCE STATUS: 1 - Symptomatic but completely ambulatory  Blood pressure (!) 141/84, pulse 89, temperature 98.5 F (36.9 C), temperature source Temporal, resp. rate 17, height 5\' 10"  (1.778 m), weight 167 lb 6.4 oz (75.9 kg), SpO2 98 %.  LABORATORY DATA: Lab Results  Component Value Date   WBC 8.0 11/24/2018   HGB 16.9 11/24/2018   HCT 54.0 (H) 11/24/2018   MCV 74.4 (L) 11/24/2018   PLT 401 (H) 11/24/2018      Chemistry      Component Value Date/Time   NA 140 11/24/2018 1004   K 4.4 11/24/2018 1004   CL 109 11/24/2018 1004   CO2 20 (L) 11/24/2018 1004   BUN 15 11/24/2018 1004   CREATININE 1.12 11/24/2018 1004      Component Value Date/Time   CALCIUM 9.3 11/24/2018 1004   ALKPHOS 68 11/24/2018 1004   AST 20 11/24/2018 1004   ALT 17 11/24/2018 1004   BILITOT 0.6 11/24/2018 1004       RADIOGRAPHIC STUDIES: No results found.  ASSESSMENT AND PLAN: This is a very pleasant 83 years old white male with recently diagnosed polycythemia vera with positive Jak 2 mutation.  The patient is also currently on hormonal therapy for androgen deficiency. CBC today showed hematocrit of 54%. I recommended for the patient to proceed with phlebotomy today as planned. I will see him back for follow-up visit in 6 weeks for evaluation with repeat  blood work as well as phlebotomy if needed. The patient was advised to call immediately if he has any concerning symptoms in the interval. The patient voices understanding of current disease status and treatment options and is in agreement with the current care plan. All questions were answered. The patient knows to call the clinic with any problems, questions or concerns. We can certainly see the patient much sooner if necessary.  I spent 10 minutes counseling the patient face to face. The total time spent in the  appointment was 15 minutes.  Disclaimer: This note was dictated with voice recognition software. Similar sounding words can inadvertently be transcribed and may not be corrected upon review.

## 2018-11-25 ENCOUNTER — Telehealth: Payer: Self-pay | Admitting: Internal Medicine

## 2018-11-25 NOTE — Telephone Encounter (Signed)
Scheduled per los. Called and spoke with patient. Confirmed appt 

## 2018-12-07 DIAGNOSIS — Z08 Encounter for follow-up examination after completed treatment for malignant neoplasm: Secondary | ICD-10-CM | POA: Diagnosis not present

## 2018-12-07 DIAGNOSIS — C4441 Basal cell carcinoma of skin of scalp and neck: Secondary | ICD-10-CM | POA: Diagnosis not present

## 2018-12-07 DIAGNOSIS — Z85828 Personal history of other malignant neoplasm of skin: Secondary | ICD-10-CM | POA: Diagnosis not present

## 2018-12-07 DIAGNOSIS — L570 Actinic keratosis: Secondary | ICD-10-CM | POA: Diagnosis not present

## 2018-12-07 DIAGNOSIS — X32XXXD Exposure to sunlight, subsequent encounter: Secondary | ICD-10-CM | POA: Diagnosis not present

## 2018-12-18 DIAGNOSIS — L309 Dermatitis, unspecified: Secondary | ICD-10-CM | POA: Diagnosis not present

## 2018-12-18 DIAGNOSIS — M109 Gout, unspecified: Secondary | ICD-10-CM | POA: Diagnosis not present

## 2018-12-18 DIAGNOSIS — R69 Illness, unspecified: Secondary | ICD-10-CM | POA: Diagnosis not present

## 2018-12-18 DIAGNOSIS — I10 Essential (primary) hypertension: Secondary | ICD-10-CM | POA: Diagnosis not present

## 2018-12-18 DIAGNOSIS — Z008 Encounter for other general examination: Secondary | ICD-10-CM | POA: Diagnosis not present

## 2018-12-18 DIAGNOSIS — D6869 Other thrombophilia: Secondary | ICD-10-CM | POA: Diagnosis not present

## 2018-12-18 DIAGNOSIS — Z791 Long term (current) use of non-steroidal anti-inflammatories (NSAID): Secondary | ICD-10-CM | POA: Diagnosis not present

## 2018-12-18 DIAGNOSIS — Z7982 Long term (current) use of aspirin: Secondary | ICD-10-CM | POA: Diagnosis not present

## 2018-12-18 DIAGNOSIS — G8929 Other chronic pain: Secondary | ICD-10-CM | POA: Diagnosis not present

## 2018-12-18 DIAGNOSIS — K219 Gastro-esophageal reflux disease without esophagitis: Secondary | ICD-10-CM | POA: Diagnosis not present

## 2018-12-18 DIAGNOSIS — I4891 Unspecified atrial fibrillation: Secondary | ICD-10-CM | POA: Diagnosis not present

## 2018-12-19 ENCOUNTER — Other Ambulatory Visit: Payer: Self-pay | Admitting: Cardiology

## 2019-01-02 NOTE — Progress Notes (Signed)
Dean Pratt OFFICE PROGRESS NOTE  [name], MD [place] [address]  DIAGNOSIS: Polycythemia vera with positive JAK 2 mutation.  PRIOR THERAPY: None  CURRENT THERAPY: Phlebotomy on as-needed basis.  INTERVAL HISTORY: Dean Pratt 83 y.o. male returns to the clinic for a follow up visit. The patient is feeling well today without any concerning complaints. He denies any headaches, visual changes, or dizziness. He denies any pruritus. Reports occassional epistaxis which occurs once every 1-2 months. He reports bleeding from either nostril. He has seen ENT in the past for this concern and stated that they did not seem concerned with this and he reports his bleeding as being mild. He is on 81 mg of aspirin daily. He denies any other recent bleeding or bruising including, melena, hematochezia, or hematuria. He denies any abdominal pain, early satiety, nausea, vomiting, diarrhea, or constipation. He denies any chest pain, shortness of breath, or hemoptysis. He is here for evaluation and repeat blood work.   MEDICAL HISTORY: Past Medical History:  Diagnosis Date  . Anxiety   . Atrial flutter ([place])    s/p afib and atrial flutter ablation 04/09/09  . Blood transfusion without reported diagnosis   . BPH (benign prostatic hyperplasia)   . Cancer ([place])    basal cell CA removed  . Cataract    removed with lens implants   . Colitis, ischemic ([place])    secondary to ebolism from afib 1/10  . Constipation   . GERD (gastroesophageal reflux disease)   . Gout   . HTN (hypertension)   . Hyperlipidemia   . Intracranial bleed ([place]) 04/10/10   Right occipital hematoma with a left homonymous hemianopsia   . Migraines   . Paroxysmal atrial fibrillation ([place])    s/p PVI 04/09/09 and 10/17/10  . Rosacea   . Stroke ([place]) 04/08/2010   hemorragic, anticoagulation stopped at that time  . Tuberculosis    s/p treatment 1964    ALLERGIES:  is allergic to ace inhibitors and  warfarin and related.  MEDICATIONS:  Current Outpatient Medications  Medication Sig Dispense Refill  . acetaminophen (TYLENOL) 325 MG tablet Take 650 mg by mouth every 6 (six) hours as needed.      Marland Kitchen allopurinol (ZYLOPRIM) 100 MG tablet Take 100 mg by mouth at bedtime.      . Ascorbic Acid (VITAMIN C) 1000 MG tablet Take 1,000 mg by mouth daily.     Marland Kitchen aspirin 81 MG tablet Take 81 mg by mouth daily.      . clobetasol (TEMOVATE) 0.05 % external solution APPLY TO AFECTED AREAS (SCALP) TWICE A DAY AS NEEDED NOT FOR FACE,GROIN,UNDER ARMS    . diclofenac sodium (VOLTAREN) 1 % GEL APPLY 2-4 GRAMS 2-3 TIMES DAILY AS NEEDED FOR PAIN 100 g 0  . diltiazem (CARDIZEM CD) 240 MG 24 hr capsule TAKE 1 CAPSULE BY MOUTH EVERY DAY 30 capsule 0  . fluticasone (CUTIVATE) 0.05 % cream Apply 1 application topically daily.    Marland Kitchen ibuprofen (ADVIL,MOTRIN) 200 MG tablet Take 200 mg by mouth every 6 (six) hours as needed.    Marland Kitchen LORazepam (ATIVAN) 1 MG tablet TAKE 1/2-1 TABLET TWICE DAILY AS NEEDED  3  . losartan (COZAAR) 25 MG tablet Take 1 tablet (25 mg total) by mouth daily. 90 tablet 3  . NON FORMULARY Stool softener 100 mg... Take 1 capsule by mouth once daily.     . Olopatadine HCl 0.2 % SOLN INSTILL 1 DROP INTO  BOTH EYES TWICE A DAY    . rosuvastatin (CRESTOR) 10 MG tablet Take 10 mg by mouth daily.  2   Current Facility-Administered Medications  Medication Dose Route Frequency Provider Last Rate Last Admin  . 0.9 %  sodium chloride infusion  500 mL Intravenous Once Irene Shipper, MD      . bupivacaine (MARCAINE) 0.5 % (with pres) injection 3 mL  3 mL Other Once Magnus Sinning, MD        SURGICAL HISTORY:  Past Surgical History:  Procedure Laterality Date  . ablation  04/09/2009   s/p afib and atrial flutter ablation by JA  . ABLATION OF DYSRHYTHMIC FOCUS  10/15/09   repeat afib ablation by JA  . APPENDECTOMY    . CATARACT EXTRACTION    . CATARACT EXTRACTION, BILATERAL     with lens implants   .  COLONOSCOPY    . CORNEAL LACERATION REPAIR    . EYE SURGERY    . INGUINAL HERNIA REPAIR    . POLYPECTOMY    . TONSILLECTOMY    . VASECTOMY      REVIEW OF SYSTEMS:   Review of Systems  Constitutional: Negative for appetite change, chills, fatigue, fever and unexpected weight change.  HENT: Positive for epistaxis once every 1-2 months. Negative for mouth sores, sore throat and trouble swallowing.   Eyes: Negative for eye problems and icterus.  Respiratory: Negative for cough, hemoptysis, shortness of breath and wheezing.  Cardiovascular: Negative for chest pain and leg swelling.  Gastrointestinal: Negative for abdominal pain, constipation, diarrhea, nausea and vomiting.  Genitourinary: Negative for bladder incontinence, difficulty urinating, dysuria, frequency and hematuria.   Musculoskeletal: Negative for back pain, gait problem, neck pain and neck stiffness.  Skin: Negative for itching and rash.  Neurological: Negative for dizziness, extremity weakness, gait problem, headaches, light-headedness and seizures.  Hematological: Negative for adenopathy. Does not bruise/bleed easily.  Psychiatric/Behavioral: Negative for confusion, depression and sleep disturbance. The patient is not nervous/anxious.     PHYSICAL EXAMINATION:  Blood pressure 129/79, pulse 66, temperature 97.8 F (36.6 C), temperature source Temporal, resp. rate 18, height 5\' 10"  (1.778 m), weight 165 lb 3.2 oz (74.9 kg), SpO2 98 %.  ECOG PERFORMANCE STATUS: 0 - Asymptomatic  Physical Exam  Constitutional: Oriented to person, place, and time and well-developed, well-nourished, and in no distress.  HENT:  Head: Normocephalic and atraumatic.  Mouth/Throat: Oropharynx is clear and moist. No oropharyngeal exudate.  Eyes: Conjunctivae are normal. Right eye exhibits no discharge. Left eye exhibits no discharge. No scleral icterus.  Neck: Normal range of motion. Neck supple.  Cardiovascular: Normal rate, irregular rhythm,  normal heart sounds and intact distal pulses.   Pulmonary/Chest: Effort normal and breath sounds normal. No respiratory distress. No wheezes. No rales.  Abdominal: Soft. Bowel sounds are normal. Exhibits no distension and no mass. There is no tenderness.  Musculoskeletal: Normal range of motion. Exhibits no edema.  Lymphadenopathy:    No cervical adenopathy.  Neurological: Alert and oriented to person, place, and time. Exhibits normal muscle tone. Gait normal. Coordination normal.  Skin: Skin is warm and dry. No rash noted. Not diaphoretic. No erythema. No pallor.  Psychiatric: Mood, memory and judgment normal.  Vitals reviewed.  LABORATORY DATA: Lab Results  Component Value Date   WBC 8.6 01/03/2019   HGB 16.2 01/03/2019   HCT 52.8 (H) 01/03/2019   MCV 73.2 (L) 01/03/2019   PLT 464 (H) 01/03/2019      Chemistry  Component Value Date/Time   NA 141 01/03/2019 0828   K 4.1 01/03/2019 0828   CL 106 01/03/2019 0828   CO2 24 01/03/2019 0828   BUN 13 01/03/2019 0828   CREATININE 1.12 01/03/2019 0828      Component Value Date/Time   CALCIUM 9.5 01/03/2019 0828   ALKPHOS 70 01/03/2019 0828   AST 16 01/03/2019 0828   ALT 15 01/03/2019 0828   BILITOT 0.7 01/03/2019 0828       RADIOGRAPHIC STUDIES:  No results found.   ASSESSMENT/PLAN:  This is a very pleasant 83 year old Caucasian male diagnosed with polycythemia vera with a positive Jak 2 mutation.   The patient had repeat CBC performed today.  His hematocrit is 52.8%  We will arrange for a therapeutic phlebotomy today. The goal is to keep his hematocrit ~45%.   We will see the patient back for follow-up visit in 4 weeks for evaluation and repeat blood work and therapeutic phlebotomy if needed.  The patient was advised to avoid red meat and iron rich foods.   The patient was advised to call immediately if he has any concerning symptoms in the interval. The patient voices understanding of current disease status and  treatment options and is in agreement with the current care plan. All questions were answered. The patient knows to call the clinic with any problems, questions or concerns. We can certainly see the patient much sooner if necessary     Orders Placed This Encounter  Procedures  . Phlebotomy therapeutic    Standing Status:   Future    Standing Expiration Date:   01/03/2020  . CBC with Differential (Cancer Center Only)    Standing Status:   Future    Standing Expiration Date:   01/03/2020  . Lactate dehydrogenase (LDH)    Standing Status:   Future    Standing Expiration Date:   01/03/2020     Tobe Sos Ivionna Verley, PA-C 01/03/19  ADDENDUM: Hematology/Oncology Attending: I had a face-to-face encounter with the patient today. I recommended his care plan. This is a very pleasant 83 years old white male with history of polycythemia vera with positive JAK2 mutation. The patient is currently on observation and repeat phlebotomy on as-needed basis. He came to the clinic today with repeat CBC that showed hematocrit of 52.8%. I recommended for the patient to proceed with phlebotomy today as planned. He will come back for follow-up visit in 1 months for reevaluation with repeat CBC, LDH as well as phlebotomy if needed. For the renal insufficiency he was encouraged to increase his oral intake and to discuss with his primary care physician for management or referral if needed. Patient was advised to call immediately if he has any concerning symptoms in the interval.  Disclaimer: This note was dictated with voice recognition software. Similar sounding words can inadvertently be transcribed and may be missed upon review. Eilleen Kempf, MD 01/03/19

## 2019-01-03 ENCOUNTER — Inpatient Hospital Stay: Payer: Medicare HMO

## 2019-01-03 ENCOUNTER — Other Ambulatory Visit: Payer: Self-pay

## 2019-01-03 ENCOUNTER — Encounter: Payer: Self-pay | Admitting: Physician Assistant

## 2019-01-03 ENCOUNTER — Inpatient Hospital Stay (HOSPITAL_BASED_OUTPATIENT_CLINIC_OR_DEPARTMENT_OTHER): Payer: Medicare HMO | Admitting: Physician Assistant

## 2019-01-03 ENCOUNTER — Inpatient Hospital Stay: Payer: Medicare HMO | Attending: Internal Medicine

## 2019-01-03 VITALS — BP 129/79 | HR 66 | Temp 97.8°F | Resp 18 | Ht 70.0 in | Wt 165.2 lb

## 2019-01-03 VITALS — BP 123/71 | HR 73 | Temp 97.6°F | Resp 16

## 2019-01-03 DIAGNOSIS — Z79899 Other long term (current) drug therapy: Secondary | ICD-10-CM | POA: Insufficient documentation

## 2019-01-03 DIAGNOSIS — D45 Polycythemia vera: Secondary | ICD-10-CM | POA: Diagnosis not present

## 2019-01-03 DIAGNOSIS — Z8673 Personal history of transient ischemic attack (TIA), and cerebral infarction without residual deficits: Secondary | ICD-10-CM | POA: Insufficient documentation

## 2019-01-03 DIAGNOSIS — N400 Enlarged prostate without lower urinary tract symptoms: Secondary | ICD-10-CM | POA: Insufficient documentation

## 2019-01-03 DIAGNOSIS — K219 Gastro-esophageal reflux disease without esophagitis: Secondary | ICD-10-CM | POA: Diagnosis not present

## 2019-01-03 DIAGNOSIS — Z7982 Long term (current) use of aspirin: Secondary | ICD-10-CM | POA: Diagnosis not present

## 2019-01-03 DIAGNOSIS — I10 Essential (primary) hypertension: Secondary | ICD-10-CM | POA: Diagnosis not present

## 2019-01-03 DIAGNOSIS — E785 Hyperlipidemia, unspecified: Secondary | ICD-10-CM | POA: Insufficient documentation

## 2019-01-03 DIAGNOSIS — R040 Epistaxis: Secondary | ICD-10-CM | POA: Diagnosis not present

## 2019-01-03 DIAGNOSIS — Z85828 Personal history of other malignant neoplasm of skin: Secondary | ICD-10-CM | POA: Diagnosis not present

## 2019-01-03 DIAGNOSIS — Z791 Long term (current) use of non-steroidal anti-inflammatories (NSAID): Secondary | ICD-10-CM | POA: Insufficient documentation

## 2019-01-03 DIAGNOSIS — I480 Paroxysmal atrial fibrillation: Secondary | ICD-10-CM | POA: Diagnosis not present

## 2019-01-03 DIAGNOSIS — M109 Gout, unspecified: Secondary | ICD-10-CM | POA: Diagnosis not present

## 2019-01-03 LAB — CBC WITH DIFFERENTIAL (CANCER CENTER ONLY)
Abs Immature Granulocytes: 0.02 10*3/uL (ref 0.00–0.07)
Basophils Absolute: 0.1 10*3/uL (ref 0.0–0.1)
Basophils Relative: 1 %
Eosinophils Absolute: 0.1 10*3/uL (ref 0.0–0.5)
Eosinophils Relative: 1 %
HCT: 52.8 % — ABNORMAL HIGH (ref 39.0–52.0)
Hemoglobin: 16.2 g/dL (ref 13.0–17.0)
Immature Granulocytes: 0 %
Lymphocytes Relative: 13 %
Lymphs Abs: 1.1 10*3/uL (ref 0.7–4.0)
MCH: 22.5 pg — ABNORMAL LOW (ref 26.0–34.0)
MCHC: 30.7 g/dL (ref 30.0–36.0)
MCV: 73.2 fL — ABNORMAL LOW (ref 80.0–100.0)
Monocytes Absolute: 0.6 10*3/uL (ref 0.1–1.0)
Monocytes Relative: 7 %
Neutro Abs: 6.7 10*3/uL (ref 1.7–7.7)
Neutrophils Relative %: 78 %
Platelet Count: 464 10*3/uL — ABNORMAL HIGH (ref 150–400)
RBC: 7.21 MIL/uL — ABNORMAL HIGH (ref 4.22–5.81)
RDW: 17.3 % — ABNORMAL HIGH (ref 11.5–15.5)
WBC Count: 8.6 10*3/uL (ref 4.0–10.5)
nRBC: 0 % (ref 0.0–0.2)

## 2019-01-03 LAB — CMP (CANCER CENTER ONLY)
ALT: 15 U/L (ref 0–44)
AST: 16 U/L (ref 15–41)
Albumin: 4.3 g/dL (ref 3.5–5.0)
Alkaline Phosphatase: 70 U/L (ref 38–126)
Anion gap: 11 (ref 5–15)
BUN: 13 mg/dL (ref 8–23)
CO2: 24 mmol/L (ref 22–32)
Calcium: 9.5 mg/dL (ref 8.9–10.3)
Chloride: 106 mmol/L (ref 98–111)
Creatinine: 1.12 mg/dL (ref 0.61–1.24)
GFR, Est AFR Am: >60 mL/min (ref >60)
GFR, Estimated: >60 mL/min (ref >60)
Glucose, Bld: 100 mg/dL — ABNORMAL HIGH (ref 70–99)
Potassium: 4.1 mmol/L (ref 3.5–5.1)
Sodium: 141 mmol/L (ref 135–145)
Total Bilirubin: 0.7 mg/dL (ref 0.3–1.2)
Total Protein: 7.1 g/dL (ref 6.5–8.1)

## 2019-01-03 LAB — LACTATE DEHYDROGENASE: LDH: 188 U/L (ref 98–192)

## 2019-01-03 NOTE — Progress Notes (Signed)
Phlebotomy for 600 gm from R antecubital without problems.  Tol Well.  Refreshments offered. Site unremarkable.

## 2019-01-03 NOTE — Patient Instructions (Signed)

## 2019-01-04 ENCOUNTER — Telehealth: Payer: Self-pay | Admitting: Internal Medicine

## 2019-01-04 NOTE — Telephone Encounter (Signed)
Scheduled per 12/29 los. Called and spoke with pt, confirmed appt

## 2019-01-05 ENCOUNTER — Other Ambulatory Visit: Payer: Medicare HMO

## 2019-01-05 ENCOUNTER — Ambulatory Visit: Payer: Medicare HMO | Admitting: Physician Assistant

## 2019-01-11 ENCOUNTER — Telehealth (INDEPENDENT_AMBULATORY_CARE_PROVIDER_SITE_OTHER): Payer: Medicare HMO | Admitting: Internal Medicine

## 2019-01-11 ENCOUNTER — Other Ambulatory Visit: Payer: Self-pay

## 2019-01-11 ENCOUNTER — Encounter: Payer: Self-pay | Admitting: Internal Medicine

## 2019-01-11 VITALS — Ht 70.0 in | Wt 163.0 lb

## 2019-01-11 DIAGNOSIS — I10 Essential (primary) hypertension: Secondary | ICD-10-CM

## 2019-01-11 DIAGNOSIS — I4819 Other persistent atrial fibrillation: Secondary | ICD-10-CM | POA: Diagnosis not present

## 2019-01-11 NOTE — Progress Notes (Signed)
Electrophysiology TeleHealth Note  Due to national recommendations of social distancing due to [place] 19, an audio telehealth visit is felt to be most appropriate for this patient at this time.  Verbal consent was obtained by me for the telehealth visit today.  The patient does not have capability for a virtual visit.  A phone visit is therefore required today.   Date:  01/11/2019   ID:  Dean Pratt, DOB [birth year], MRN [id number]  Location: patient's home  Provider location:  [facility]  Evaluation Performed: Follow-up visit  PCP:  [name], MD   Electrophysiologist:  Dr Rayann Heman  Chief Complaint:  palpitations  History of Present Illness:    Dean Pratt is a 84 y.o. male who presents via telehealth conferencing today.  Since last being seen in our clinic, the patient reports doing very well.  Today, he denies symptoms of palpitations, chest pain, shortness of breath,  lower extremity edema, dizziness, presyncope, or syncope.  The patient is otherwise without complaint today.  The patient denies symptoms of fevers, chills, cough, or new SOB worrisome for COVID 19.  Past Medical History:  Diagnosis Date  . Anxiety   . Atrial flutter ([place])    s/p afib and atrial flutter ablation 04/09/09  . Blood transfusion without reported diagnosis   . BPH (benign prostatic hyperplasia)   . Cancer ([place])    basal cell CA removed  . Cataract    removed with lens implants   . Colitis, ischemic ([place])    secondary to ebolism from afib 1/10  . Constipation   . GERD (gastroesophageal reflux disease)   . Gout   . HTN (hypertension)   . Hyperlipidemia   . Intracranial bleed ([place]) 04/10/10   Right occipital hematoma with a left homonymous hemianopsia   . Migraines   . Paroxysmal atrial fibrillation ([place])    s/p PVI 04/09/09 and 10/17/10  . Rosacea   . Stroke ([place]) 04/08/2010   hemorragic, anticoagulation stopped at that time  . Tuberculosis    s/p treatment 1964    Past Surgical  History:  Procedure Laterality Date  . ablation  04/09/2009   s/p afib and atrial flutter ablation by JA  . ABLATION OF DYSRHYTHMIC FOCUS  10/15/09   repeat afib ablation by JA  . APPENDECTOMY    . CATARACT EXTRACTION    . CATARACT EXTRACTION, BILATERAL     with lens implants   . COLONOSCOPY    . CORNEAL LACERATION REPAIR    . EYE SURGERY    . INGUINAL HERNIA REPAIR    . POLYPECTOMY    . TONSILLECTOMY    . VASECTOMY      Current Outpatient Medications  Medication Sig Dispense Refill  . acetaminophen (TYLENOL) 325 MG tablet Take 650 mg by mouth every 6 (six) hours as needed.      Marland Kitchen allopurinol (ZYLOPRIM) 100 MG tablet Take 100 mg by mouth at bedtime.      . Ascorbic Acid (VITAMIN C) 1000 MG tablet Take 1,000 mg by mouth daily.     Marland Kitchen aspirin 81 MG tablet Take 81 mg by mouth daily.      . clobetasol (TEMOVATE) 0.05 % external solution APPLY TO AFECTED AREAS (SCALP) TWICE A DAY AS NEEDED NOT FOR FACE,GROIN,UNDER ARMS    . diclofenac sodium (VOLTAREN) 1 % GEL APPLY 2-4 GRAMS 2-3 TIMES DAILY AS NEEDED FOR PAIN 100 g 0  . diltiazem (CARDIZEM CD) 240 MG 24 hr capsule  TAKE 1 CAPSULE BY MOUTH EVERY DAY 30 capsule 0  . fluticasone (CUTIVATE) 0.05 % cream Apply 1 application topically daily.    Marland Kitchen ibuprofen (ADVIL,MOTRIN) 200 MG tablet Take 200 mg by mouth every 6 (six) hours as needed.    Marland Kitchen LORazepam (ATIVAN) 1 MG tablet TAKE 1/2-1 TABLET TWICE DAILY AS NEEDED  3  . losartan (COZAAR) 25 MG tablet Take 1 tablet (25 mg total) by mouth daily. 90 tablet 3  . NON FORMULARY Stool softener 100 mg... Take 1 capsule by mouth once daily.     . Olopatadine HCl 0.2 % SOLN INSTILL 1 DROP INTO BOTH EYES TWICE A DAY    . rosuvastatin (CRESTOR) 10 MG tablet Take 10 mg by mouth daily.  2   Current Facility-Administered Medications  Medication Dose Route Frequency Provider Last Rate Last Admin  . 0.9 %  sodium chloride infusion  500 mL Intravenous Once Irene Shipper, MD      . bupivacaine (MARCAINE) 0.5 %  (with pres) injection 3 mL  3 mL Other Once Magnus Sinning, MD        Allergies:   Ace inhibitors and Warfarin and related   Social History:  The patient  reports that he has quit smoking. He has never used smokeless tobacco. He reports that he does not drink alcohol or use drugs.   Family History:  The patient's family history includes Breast cancer in his sister; COPD in his mother; Coronary artery disease in his father; Liver cancer in his father; Prostate cancer in his father.   ROS:  Please see the history of present illness.   All other systems are personally reviewed and negative.    Exam:    Vital Signs:  Ht 5\' 10"  (1.778 m)   Wt 163 lb (73.9 kg)   BMI 23.39 kg/m   Well sounding, alert and conversant   Labs/Other Tests and Data Reviewed:    Recent Labs: 01/03/2019: ALT 15; BUN 13; Creatinine 1.12; Hemoglobin 16.2; Platelet Count 464; Potassium 4.1; Sodium 141   Wt Readings from Last 3 Encounters:  01/11/19 163 lb (73.9 kg)  01/03/19 165 lb 3.2 oz (74.9 kg)  11/24/18 167 lb 6.4 oz (75.9 kg)    ASSESSMENT & PLAN:    1.  Persistent atrial fibrillation asymptomatic with rate controlled afib chads2vasc score is 5 however given prior ICH on coumadin, we have opted to keep off of anticoagulation currently.  Could consider LAAO eventually  2. HTN Stable No change required today  Follow-up:  12 months with me   Patient Risk:  after full review of this patients clinical status, I feel that they are at moderate risk at this time.  Today, I have spent 15 minutes with the patient with telehealth technology discussing arrhythmia management .    Army Fossa, MD  01/11/2019 11:59 AM     [id number] [facility] [address] [place] [address] [phone] (office) [phone] (fax)

## 2019-01-29 NOTE — Progress Notes (Signed)
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  PRIOR THERAPY: None  CURRENT THERAPY: Phlebotomy on as-needed basis.  INTERVAL HISTORY: QUITON Pratt 84 y.o. male returns to the clinic for a follow up visit. The patient is feeling well today without any concerning complaints. He denies any headaches, visual changes, or dizziness. He denies any pruritus. Reports occassional epistaxis which occurs once every 1-2 months but has not had any episodes in the interval since his last appointment. He has seen ENT in the past for this concern. He is on 81 mg of aspirin daily. He denies any other recent bleeding or bruising including, melena, hematochezia, or hematuria. He denies any abdominal pain, early satiety, nausea, vomiting, diarrhea, or constipation. He denies any chest pain, shortness of breath, or hemoptysis. Denies flushing. He is here for evaluation and repeat blood work.   MEDICAL HISTORY: Past Medical History:  Diagnosis Date  . Anxiety   . Atrial flutter ([place])    s/p afib and atrial flutter ablation 04/09/09  . Blood transfusion without reported diagnosis   . BPH (benign prostatic hyperplasia)   . Cancer ([place])    basal cell CA removed  . Cataract    removed with lens implants   . Colitis, ischemic ([place])    secondary to ebolism from afib 1/10  . Constipation   . GERD (gastroesophageal reflux disease)   . Gout   . HTN (hypertension)   . Hyperlipidemia   . Intracranial bleed ([place]) 04/10/10   Right occipital hematoma with a left homonymous hemianopsia   . Migraines   . Paroxysmal atrial fibrillation ([place])    s/p PVI 04/09/09 and 10/17/10  . Rosacea   . Stroke ([place]) 04/08/2010   hemorragic, anticoagulation stopped at that time  . Tuberculosis    s/p treatment 1964    ALLERGIES:  is allergic to ace inhibitors and warfarin and related.  MEDICATIONS:  Current  Outpatient Medications  Medication Sig Dispense Refill  . acetaminophen (TYLENOL) 325 MG tablet Take 650 mg by mouth every 6 (six) hours as needed.      Marland Kitchen allopurinol (ZYLOPRIM) 100 MG tablet Take 100 mg by mouth at bedtime.      . Ascorbic Acid (VITAMIN C) 1000 MG tablet Take 1,000 mg by mouth daily.     Marland Kitchen aspirin 81 MG tablet Take 81 mg by mouth daily.      . clobetasol (TEMOVATE) 0.05 % external solution APPLY TO AFECTED AREAS (SCALP) TWICE A DAY AS NEEDED NOT FOR FACE,GROIN,UNDER ARMS    . diclofenac sodium (VOLTAREN) 1 % GEL APPLY 2-4 GRAMS 2-3 TIMES DAILY AS NEEDED FOR PAIN 100 g 0  . fluticasone (CUTIVATE) 0.05 % cream Apply 1 application topically daily.    Marland Kitchen ibuprofen (ADVIL,MOTRIN) 200 MG tablet Take 200 mg by mouth every 6 (six) hours as needed.    Marland Kitchen LORazepam (ATIVAN) 1 MG tablet TAKE 1/2-1 TABLET TWICE DAILY AS NEEDED  3  . losartan (COZAAR) 25 MG tablet Take 1 tablet (25 mg total) by mouth daily. 90 tablet 3  . NON FORMULARY Stool softener 100 mg... Take 1 capsule by mouth once daily.     . Olopatadine HCl 0.2 % SOLN INSTILL 1 DROP INTO BOTH EYES TWICE A DAY    . rosuvastatin (CRESTOR) 10 MG tablet Take 10 mg by mouth daily.  2  . diltiazem (CARDIZEM CD) 240 MG  24 hr capsule TAKE 1 CAPSULE BY MOUTH EVERY DAY 30 capsule 0   Current Facility-Administered Medications  Medication Dose Route Frequency Provider Last Rate Last Admin  . 0.9 %  sodium chloride infusion  500 mL Intravenous Once Irene Shipper, MD      . bupivacaine (MARCAINE) 0.5 % (with pres) injection 3 mL  3 mL Other Once Magnus Sinning, MD        SURGICAL HISTORY:  Past Surgical History:  Procedure Laterality Date  . ablation  04/09/2009   s/p afib and atrial flutter ablation by JA  . ABLATION OF DYSRHYTHMIC FOCUS  10/15/09   repeat afib ablation by JA  . APPENDECTOMY    . CATARACT EXTRACTION    . CATARACT EXTRACTION, BILATERAL     with lens implants   . COLONOSCOPY    . CORNEAL LACERATION REPAIR    . EYE  SURGERY    . INGUINAL HERNIA REPAIR    . POLYPECTOMY    . TONSILLECTOMY    . VASECTOMY      REVIEW OF SYSTEMS:   Review of Systems  Constitutional: Negative for appetite change, chills, fatigue, fever and unexpected weight change.  HENT: Negative for mouth sores, epistaxis, sore throat and trouble swallowing.   Eyes: Negative for eye problems and icterus.  Respiratory: Negative for cough, hemoptysis, shortness of breath and wheezing.  Cardiovascular: Negative for chest pain and leg swelling.  Gastrointestinal: Negative for abdominal pain, constipation, diarrhea, nausea and vomiting.  Genitourinary: Negative for bladder incontinence, difficulty urinating, dysuria, frequency and hematuria.   Musculoskeletal: Negative for back pain, gait problem, neck pain and neck stiffness.  Skin: Negative for itching and rash.  Neurological: Negative for dizziness, extremity weakness, gait problem, headaches, light-headedness and seizures.  Hematological: Negative for adenopathy. Does not bruise/bleed easily.  Psychiatric/Behavioral: Negative for confusion, depression and sleep disturbance. The patient is not nervous/anxious.    PHYSICAL EXAMINATION:  Blood pressure 136/63, pulse 60, temperature 98.9 F (37.2 C), temperature source Temporal, resp. rate 18, height 5\' 10"  (1.778 m), weight 161 lb 1.6 oz (73.1 kg), SpO2 99 %.  ECOG PERFORMANCE STATUS: 0 - Asymptomatic  Physical Exam  Constitutional: Oriented to person, place, and time and well-developed, well-nourished, and in no distress.  HENT:  Head: Normocephalic and atraumatic.  Mouth/Throat: Oropharynx is clear and moist. No oropharyngeal exudate.  Eyes: Conjunctivae are normal. Right eye exhibits no discharge. Left eye exhibits no discharge. No scleral icterus.  Neck: Normal range of motion. Neck supple.  Cardiovascular: Normal rate, irregular rhythm, normal heart sounds and intact distal pulses.   Pulmonary/Chest: Effort normal and breath  sounds normal. No respiratory distress. No wheezes. No rales.  Abdominal: Soft. Bowel sounds are normal. Exhibits no distension and no mass. There is no tenderness.  Musculoskeletal: Normal range of motion. Exhibits no edema.  Lymphadenopathy:    No cervical adenopathy.  Neurological: Alert and oriented to person, place, and time. Exhibits normal muscle tone. Gait normal. Coordination normal.  Skin: Skin is warm and dry. No rash noted. Not diaphoretic. No erythema. No pallor.  Psychiatric: Mood, memory and judgment normal.  Vitals reviewed.  LABORATORY DATA: Lab Results  Component Value Date   WBC 8.1 01/30/2019   HGB 15.2 01/30/2019   HCT 49.3 01/30/2019   MCV 73.0 (L) 01/30/2019   PLT 506 (H) 01/30/2019      Chemistry      Component Value Date/Time   NA 141 01/03/2019 0828   K 4.1 01/03/2019 [id number]  CL 106 01/03/2019 0828   CO2 24 01/03/2019 0828   BUN 13 01/03/2019 0828   CREATININE 1.12 01/03/2019 0828      Component Value Date/Time   CALCIUM 9.5 01/03/2019 0828   ALKPHOS 70 01/03/2019 0828   AST 16 01/03/2019 0828   ALT 15 01/03/2019 0828   BILITOT 0.7 01/03/2019 0828       RADIOGRAPHIC STUDIES:  No results found.   ASSESSMENT/PLAN:  This is a very pleasant 84 year old Caucasian male diagnosed with polycythemia vera with a positive Jak 2 mutation.   The patient had repeat CBC performed today.  His hematocrit is 49.3%  We will arrange for a therapeutic phlebotomy today. The goal is to keep his hematocrit ~45%.   We will see the patient back for follow-up visit in 4 weeks for evaluation and repeat blood work and therapeutic phlebotomy if needed.  The patient was advised to avoid red meat and iron rich foods.   The patient was advised to call immediately if he has any concerning symptoms in the interval. The patient voices understanding of current disease status and treatment options and is in agreement with the current care plan. All questions were  answered. The patient knows to call the clinic with any problems, questions or concerns. We can certainly see the patient much sooner if necessary.   Orders Placed This Encounter  Procedures  . Phlebotomy therapeutic    Standing Status:   Future    Standing Expiration Date:   01/30/2020  . CBC with Differential (Cancer Center Only)    Standing Status:   Future    Standing Expiration Date:   01/30/2020  . Lactate dehydrogenase (LDH)    Standing Status:   Future    Standing Expiration Date:   01/30/2020     Valorie Mcgrory L Deserae Jennings, PA-C 01/30/19

## 2019-01-30 ENCOUNTER — Encounter: Payer: Self-pay | Admitting: Physician Assistant

## 2019-01-30 ENCOUNTER — Inpatient Hospital Stay: Payer: Medicare HMO

## 2019-01-30 ENCOUNTER — Other Ambulatory Visit: Payer: Self-pay

## 2019-01-30 ENCOUNTER — Inpatient Hospital Stay: Payer: Medicare HMO | Attending: Internal Medicine | Admitting: Physician Assistant

## 2019-01-30 VITALS — BP 136/63 | HR 60 | Temp 98.9°F | Resp 18 | Ht 70.0 in | Wt 161.1 lb

## 2019-01-30 DIAGNOSIS — I480 Paroxysmal atrial fibrillation: Secondary | ICD-10-CM | POA: Diagnosis not present

## 2019-01-30 DIAGNOSIS — D45 Polycythemia vera: Secondary | ICD-10-CM

## 2019-01-30 DIAGNOSIS — K219 Gastro-esophageal reflux disease without esophagitis: Secondary | ICD-10-CM | POA: Diagnosis not present

## 2019-01-30 DIAGNOSIS — Z85828 Personal history of other malignant neoplasm of skin: Secondary | ICD-10-CM | POA: Insufficient documentation

## 2019-01-30 DIAGNOSIS — F419 Anxiety disorder, unspecified: Secondary | ICD-10-CM | POA: Diagnosis not present

## 2019-01-30 DIAGNOSIS — Z79899 Other long term (current) drug therapy: Secondary | ICD-10-CM | POA: Diagnosis not present

## 2019-01-30 DIAGNOSIS — E785 Hyperlipidemia, unspecified: Secondary | ICD-10-CM | POA: Diagnosis not present

## 2019-01-30 DIAGNOSIS — Z8673 Personal history of transient ischemic attack (TIA), and cerebral infarction without residual deficits: Secondary | ICD-10-CM | POA: Diagnosis not present

## 2019-01-30 DIAGNOSIS — N400 Enlarged prostate without lower urinary tract symptoms: Secondary | ICD-10-CM | POA: Diagnosis not present

## 2019-01-30 DIAGNOSIS — Z791 Long term (current) use of non-steroidal anti-inflammatories (NSAID): Secondary | ICD-10-CM | POA: Insufficient documentation

## 2019-01-30 DIAGNOSIS — M109 Gout, unspecified: Secondary | ICD-10-CM | POA: Insufficient documentation

## 2019-01-30 DIAGNOSIS — R69 Illness, unspecified: Secondary | ICD-10-CM | POA: Diagnosis not present

## 2019-01-30 DIAGNOSIS — Z7982 Long term (current) use of aspirin: Secondary | ICD-10-CM | POA: Insufficient documentation

## 2019-01-30 DIAGNOSIS — I10 Essential (primary) hypertension: Secondary | ICD-10-CM | POA: Insufficient documentation

## 2019-01-30 LAB — CBC WITH DIFFERENTIAL (CANCER CENTER ONLY)
Abs Immature Granulocytes: 0.01 10*3/uL (ref 0.00–0.07)
Basophils Absolute: 0.1 10*3/uL (ref 0.0–0.1)
Basophils Relative: 1 %
Eosinophils Absolute: 0.1 10*3/uL (ref 0.0–0.5)
Eosinophils Relative: 2 %
HCT: 49.3 % (ref 39.0–52.0)
Hemoglobin: 15.2 g/dL (ref 13.0–17.0)
Immature Granulocytes: 0 %
Lymphocytes Relative: 12 %
Lymphs Abs: 1 10*3/uL (ref 0.7–4.0)
MCH: 22.5 pg — ABNORMAL LOW (ref 26.0–34.0)
MCHC: 30.8 g/dL (ref 30.0–36.0)
MCV: 73 fL — ABNORMAL LOW (ref 80.0–100.0)
Monocytes Absolute: 0.6 10*3/uL (ref 0.1–1.0)
Monocytes Relative: 7 %
Neutro Abs: 6.3 10*3/uL (ref 1.7–7.7)
Neutrophils Relative %: 78 %
Platelet Count: 506 10*3/uL — ABNORMAL HIGH (ref 150–400)
RBC: 6.75 MIL/uL — ABNORMAL HIGH (ref 4.22–5.81)
RDW: 15.9 % — ABNORMAL HIGH (ref 11.5–15.5)
WBC Count: 8.1 10*3/uL (ref 4.0–10.5)
nRBC: 0 % (ref 0.0–0.2)

## 2019-01-30 LAB — LACTATE DEHYDROGENASE: LDH: 126 U/L (ref 98–192)

## 2019-01-30 NOTE — Progress Notes (Signed)
Phlebotomy complete to left AC. Obtained 527g. Tol well. Offered hydration.

## 2019-01-30 NOTE — Patient Instructions (Signed)

## 2019-02-01 DIAGNOSIS — L814 Other melanin hyperpigmentation: Secondary | ICD-10-CM | POA: Diagnosis not present

## 2019-02-01 DIAGNOSIS — L820 Inflamed seborrheic keratosis: Secondary | ICD-10-CM | POA: Diagnosis not present

## 2019-02-01 DIAGNOSIS — Z08 Encounter for follow-up examination after completed treatment for malignant neoplasm: Secondary | ICD-10-CM | POA: Diagnosis not present

## 2019-02-01 DIAGNOSIS — X32XXXD Exposure to sunlight, subsequent encounter: Secondary | ICD-10-CM | POA: Diagnosis not present

## 2019-02-01 DIAGNOSIS — L821 Other seborrheic keratosis: Secondary | ICD-10-CM | POA: Diagnosis not present

## 2019-02-01 DIAGNOSIS — Z85828 Personal history of other malignant neoplasm of skin: Secondary | ICD-10-CM | POA: Diagnosis not present

## 2019-02-01 DIAGNOSIS — L570 Actinic keratosis: Secondary | ICD-10-CM | POA: Diagnosis not present

## 2019-02-18 ENCOUNTER — Ambulatory Visit: Payer: Medicare HMO | Attending: Internal Medicine

## 2019-02-18 DIAGNOSIS — Z23 Encounter for immunization: Secondary | ICD-10-CM | POA: Insufficient documentation

## 2019-02-18 NOTE — Progress Notes (Signed)
   Covid-19 Vaccination Clinic  Name:  PRINCESTON CHAPMON    MRN: [id number] DOB: 1935/12/06  02/18/2019  Mr. Hockaday was observed post Covid-19 immunization for 15 minutes without incidence. He was provided with Vaccine Information Sheet and instruction to access the V-Safe system.   Mr. Bredow was instructed to call 911 with any severe reactions post vaccine: Marland Kitchen Difficulty breathing  . Swelling of your face and throat  . A fast heartbeat  . A bad rash all over your body  . Dizziness and weakness    Immunizations Administered    Name Date Dose VIS Date Route   Pfizer COVID-19 Vaccine 02/18/2019  2:38 PM 0.3 mL 12/16/2018 Intramuscular   Manufacturer: [place]   Lot: [id number]   [place]: [id number]

## 2019-02-26 NOTE — Progress Notes (Signed)
[facility] OFFICE PROGRESS NOTE  [name], MD [place] [address]  DIAGNOSIS: Polycythemia vera with positive JAK 2 mutation.  PRIOR THERAPY: None  CURRENT THERAPY: Phlebotomy on as-needed basis.  INTERVAL HISTORY: Dean Pratt 84 y.o. male returns to the clinic for a follow up visit. The patient is feelingwell todaywithout any concerning complaints. He denies any headaches, visual changes, or dizziness. He denies any pruritus.Reports occassional epistaxis which occurs once every 1-2 months but has not had any episodes in the interval since his last appointment. He has seen ENT in the past for this concern. He is on 81 mg of aspirin daily.He denies anyotherrecent bleeding or bruising including, melena, hematochezia, or hematuria. He denies any abdominal pain,early satiety,nausea, vomiting, diarrhea, or constipation. He denies any chest pain, shortness of breath, or hemoptysis. Denies flushing. He knows that he is to eat a low iron diet but is inquiring about what foods to avoid. He is here for evaluation and repeat blood work.  MEDICAL HISTORY: Past Medical History:  Diagnosis Date  . Anxiety   . Atrial flutter ([place])    s/p afib and atrial flutter ablation 04/09/09  . Blood transfusion without reported diagnosis   . BPH (benign prostatic hyperplasia)   . Cancer ([place])    basal cell CA removed  . Cataract    removed with lens implants   . Colitis, ischemic ([place])    secondary to ebolism from afib 1/10  . Constipation   . GERD (gastroesophageal reflux disease)   . Gout   . HTN (hypertension)   . Hyperlipidemia   . Intracranial bleed ([place]) 04/10/10   Right occipital hematoma with a left homonymous hemianopsia   . Migraines   . Paroxysmal atrial fibrillation ([place])    s/p PVI 04/09/09 and 10/17/10  . Rosacea   . Stroke ([place]) 04/08/2010   hemorragic, anticoagulation stopped at that time  . Tuberculosis    s/p treatment 1964    ALLERGIES:   is allergic to ace inhibitors and warfarin and related.  MEDICATIONS:  Current Outpatient Medications  Medication Sig Dispense Refill  . acetaminophen (TYLENOL) 325 MG tablet Take 650 mg by mouth every 6 (six) hours as needed.      Marland Kitchen allopurinol (ZYLOPRIM) 100 MG tablet Take 100 mg by mouth at bedtime.      . Ascorbic Acid (VITAMIN C) 1000 MG tablet Take 1,000 mg by mouth daily.     Marland Kitchen aspirin 81 MG tablet Take 81 mg by mouth daily.      . clobetasol (TEMOVATE) 0.05 % external solution APPLY TO AFECTED AREAS (SCALP) TWICE A DAY AS NEEDED NOT FOR FACE,GROIN,UNDER ARMS    . diclofenac sodium (VOLTAREN) 1 % GEL APPLY 2-4 GRAMS 2-3 TIMES DAILY AS NEEDED FOR PAIN 100 g 0  . fluticasone (CUTIVATE) 0.05 % cream Apply 1 application topically daily.    Marland Kitchen ibuprofen (ADVIL,MOTRIN) 200 MG tablet Take 200 mg by mouth every 6 (six) hours as needed.    Marland Kitchen LORazepam (ATIVAN) 1 MG tablet TAKE 1/2-1 TABLET TWICE DAILY AS NEEDED  3  . losartan (COZAAR) 25 MG tablet Take 1 tablet (25 mg total) by mouth daily. 90 tablet 3  . NON FORMULARY Stool softener 100 mg... Take 1 capsule by mouth once daily.     . Olopatadine HCl 0.2 % SOLN INSTILL 1 DROP INTO BOTH EYES TWICE A DAY    . rosuvastatin (CRESTOR) 10 MG tablet Take 10 mg by mouth  daily.  2  . diltiazem (CARDIZEM CD) 240 MG 24 hr capsule TAKE 1 CAPSULE BY MOUTH EVERY DAY 30 capsule 0   Current Facility-Administered Medications  Medication Dose Route Frequency Provider Last Rate Last Admin  . 0.9 %  sodium chloride infusion  500 mL Intravenous Once Irene Shipper, MD      . bupivacaine (MARCAINE) 0.5 % (with pres) injection 3 mL  3 mL Other Once Magnus Sinning, MD        SURGICAL HISTORY:  Past Surgical History:  Procedure Laterality Date  . ablation  04/09/2009   s/p afib and atrial flutter ablation by JA  . ABLATION OF DYSRHYTHMIC FOCUS  10/15/09   repeat afib ablation by JA  . APPENDECTOMY    . CATARACT EXTRACTION    . CATARACT EXTRACTION, BILATERAL      with lens implants   . COLONOSCOPY    . CORNEAL LACERATION REPAIR    . EYE SURGERY    . INGUINAL HERNIA REPAIR    . POLYPECTOMY    . TONSILLECTOMY    . VASECTOMY      REVIEW OF SYSTEMS:   Review of Systems  Constitutional: Negative for appetite change, chills, fatigue, fever and unexpected weight change.  HENT:   Negative for mouth sores, nosebleeds, sore throat and trouble swallowing.  Eyes: Negative for eye problems and icterus.  Respiratory: Negative for cough, hemoptysis, shortness of breath and wheezing.   Cardiovascular: Negative for chest pain and leg swelling.  Gastrointestinal: Negative for abdominal pain, constipation, diarrhea, nausea and vomiting.  Genitourinary: Negative for bladder incontinence, difficulty urinating, dysuria, frequency and hematuria.   Musculoskeletal: Negative for back pain, gait problem, neck pain and neck stiffness.  Skin: Negative for itching and rash.  Neurological: Negative for dizziness, extremity weakness, gait problem, headaches, light-headedness and seizures.  Hematological: Negative for adenopathy. Does not bruise/bleed easily.  Psychiatric/Behavioral: Negative for confusion, depression and sleep disturbance. The patient is not nervous/anxious.     PHYSICAL EXAMINATION:  Blood pressure 134/71, pulse 65, temperature 98.3 F (36.8 C), temperature source Temporal, resp. rate 17, height 5\' 10"  (1.778 m), weight 162 lb 8 oz (73.7 kg), SpO2 95 %.  ECOG PERFORMANCE STATUS: 0 - Asymptomatic  Physical Exam  Constitutional: Oriented to person, place, and time and well-developed, well-nourished, and in no distress.  HENT:  Head: Normocephalic and atraumatic.  Mouth/Throat: Oropharynx is clear and moist. No oropharyngeal exudate.  Eyes: Conjunctivae are normal. Right eye exhibits no discharge. Left eye exhibits no discharge. No scleral icterus.  Neck: Normal range of motion. Neck supple.  Cardiovascular: Normal rate, irregular rhythm, normal  heart sounds and intact distal pulses.   Pulmonary/Chest: Effort normal and breath sounds normal. No respiratory distress. No wheezes. No rales.  Abdominal: Soft. Bowel sounds are normal. Exhibits no distension and no mass. There is no tenderness.  Musculoskeletal: Normal range of motion. Exhibits no edema.  Lymphadenopathy:    No cervical adenopathy.  Neurological: Alert and oriented to person, place, and time. Exhibits normal muscle tone. Gait normal. Coordination normal.  Skin: Skin is warm and dry. No rash noted. Not diaphoretic. No erythema. No pallor.  Psychiatric: Mood, memory and judgment normal.  Vitals reviewed.  LABORATORY DATA: Lab Results  Component Value Date   WBC 8.0 02/27/2019   HGB 14.3 02/27/2019   HCT 48.1 02/27/2019   MCV 72.2 (L) 02/27/2019   PLT 561 (H) 02/27/2019      Chemistry      Component Value  Date/Time   NA 141 01/03/2019 0828   K 4.1 01/03/2019 0828   CL 106 01/03/2019 0828   CO2 24 01/03/2019 0828   BUN 13 01/03/2019 0828   CREATININE 1.12 01/03/2019 0828      Component Value Date/Time   CALCIUM 9.5 01/03/2019 0828   ALKPHOS 70 01/03/2019 0828   AST 16 01/03/2019 0828   ALT 15 01/03/2019 0828   BILITOT 0.7 01/03/2019 0828       RADIOGRAPHIC STUDIES:  No results found.   ASSESSMENT/PLAN:  This is a very pleasant 84 year old Caucasian male diagnosed with polycythemia vera with a positive Jak 2 mutation.   The patient had repeat CBC performed today. His hematocrit is 48.1.  We will arrange fora therapeutic phlebotomy today. The goal is to keep his hematocrit ~45%.  We will see the patient back for follow-up visit in4weeks for evaluation and repeat blood work and therapeutic phlebotomy if needed.  The patient was advised to avoid red meat and iron rich foods.He was given a handout on foods rich in iron so he will know which foods to limit in his diet.   The patient was advised to call immediately if he has any concerning  symptoms in the interval. The patient voices understanding of current disease status and treatment options and is in agreement with the current care plan. All questions were answered. The patient knows to call the clinic with any problems, questions or concerns. We can certainly see the patient much sooner if necessary.    Orders Placed This Encounter  Procedures  . Phlebotomy therapeutic    Standing Status:   Future    Standing Expiration Date:   02/27/2020  . CBC with Differential (Cancer Center Only)    Standing Status:   Future    Standing Expiration Date:   02/27/2020  . Lactate dehydrogenase (LDH)    Standing Status:   Future    Standing Expiration Date:   02/27/2020     Tobe Sos [place], PA-C 02/27/19

## 2019-02-27 ENCOUNTER — Other Ambulatory Visit: Payer: Medicare HMO

## 2019-02-27 ENCOUNTER — Inpatient Hospital Stay: Payer: Medicare HMO

## 2019-02-27 ENCOUNTER — Inpatient Hospital Stay: Payer: Medicare HMO | Attending: Internal Medicine

## 2019-02-27 ENCOUNTER — Other Ambulatory Visit: Payer: Self-pay

## 2019-02-27 ENCOUNTER — Ambulatory Visit: Payer: Medicare HMO | Admitting: Physician Assistant

## 2019-02-27 ENCOUNTER — Inpatient Hospital Stay (HOSPITAL_BASED_OUTPATIENT_CLINIC_OR_DEPARTMENT_OTHER): Payer: Medicare HMO | Admitting: Physician Assistant

## 2019-02-27 ENCOUNTER — Encounter: Payer: Self-pay | Admitting: Physician Assistant

## 2019-02-27 VITALS — BP 134/71 | HR 65 | Temp 98.3°F | Resp 17 | Ht 70.0 in | Wt 162.5 lb

## 2019-02-27 VITALS — BP 109/50 | HR 58

## 2019-02-27 DIAGNOSIS — I480 Paroxysmal atrial fibrillation: Secondary | ICD-10-CM | POA: Insufficient documentation

## 2019-02-27 DIAGNOSIS — Z8673 Personal history of transient ischemic attack (TIA), and cerebral infarction without residual deficits: Secondary | ICD-10-CM | POA: Insufficient documentation

## 2019-02-27 DIAGNOSIS — I10 Essential (primary) hypertension: Secondary | ICD-10-CM | POA: Insufficient documentation

## 2019-02-27 DIAGNOSIS — Z79899 Other long term (current) drug therapy: Secondary | ICD-10-CM | POA: Insufficient documentation

## 2019-02-27 DIAGNOSIS — Z85828 Personal history of other malignant neoplasm of skin: Secondary | ICD-10-CM | POA: Insufficient documentation

## 2019-02-27 DIAGNOSIS — F419 Anxiety disorder, unspecified: Secondary | ICD-10-CM | POA: Insufficient documentation

## 2019-02-27 DIAGNOSIS — N400 Enlarged prostate without lower urinary tract symptoms: Secondary | ICD-10-CM | POA: Insufficient documentation

## 2019-02-27 DIAGNOSIS — E785 Hyperlipidemia, unspecified: Secondary | ICD-10-CM | POA: Diagnosis not present

## 2019-02-27 DIAGNOSIS — D45 Polycythemia vera: Secondary | ICD-10-CM

## 2019-02-27 DIAGNOSIS — M109 Gout, unspecified: Secondary | ICD-10-CM | POA: Diagnosis not present

## 2019-02-27 DIAGNOSIS — K219 Gastro-esophageal reflux disease without esophagitis: Secondary | ICD-10-CM | POA: Insufficient documentation

## 2019-02-27 DIAGNOSIS — Z791 Long term (current) use of non-steroidal anti-inflammatories (NSAID): Secondary | ICD-10-CM | POA: Insufficient documentation

## 2019-02-27 DIAGNOSIS — Z7982 Long term (current) use of aspirin: Secondary | ICD-10-CM | POA: Insufficient documentation

## 2019-02-27 DIAGNOSIS — R69 Illness, unspecified: Secondary | ICD-10-CM | POA: Diagnosis not present

## 2019-02-27 LAB — CBC WITH DIFFERENTIAL (CANCER CENTER ONLY)
Abs Immature Granulocytes: 0.02 10*3/uL (ref 0.00–0.07)
Basophils Absolute: 0.1 10*3/uL (ref 0.0–0.1)
Basophils Relative: 1 %
Eosinophils Absolute: 0.2 10*3/uL (ref 0.0–0.5)
Eosinophils Relative: 2 %
HCT: 48.1 % (ref 39.0–52.0)
Hemoglobin: 14.3 g/dL (ref 13.0–17.0)
Immature Granulocytes: 0 %
Lymphocytes Relative: 14 %
Lymphs Abs: 1.2 10*3/uL (ref 0.7–4.0)
MCH: 21.5 pg — ABNORMAL LOW (ref 26.0–34.0)
MCHC: 29.7 g/dL — ABNORMAL LOW (ref 30.0–36.0)
MCV: 72.2 fL — ABNORMAL LOW (ref 80.0–100.0)
Monocytes Absolute: 0.7 10*3/uL (ref 0.1–1.0)
Monocytes Relative: 9 %
Neutro Abs: 5.9 10*3/uL (ref 1.7–7.7)
Neutrophils Relative %: 74 %
Platelet Count: 561 10*3/uL — ABNORMAL HIGH (ref 150–400)
RBC: 6.66 MIL/uL — ABNORMAL HIGH (ref 4.22–5.81)
RDW: 15.1 % (ref 11.5–15.5)
WBC Count: 8 10*3/uL (ref 4.0–10.5)
nRBC: 0 % (ref 0.0–0.2)

## 2019-02-27 LAB — LACTATE DEHYDROGENASE: LDH: 120 U/L (ref 98–192)

## 2019-02-27 NOTE — Patient Instructions (Addendum)

## 2019-02-27 NOTE — Patient Instructions (Signed)
Therapeutic Phlebotomy Therapeutic phlebotomy is the planned removal of blood from a person's body for the purpose of treating a medical condition. The procedure is similar to donating blood. Usually, about a pint (470 mL, or 0.47 L) of blood is removed. The average adult has 9-12 pints (4.3-5.7 L) of blood in the body. Therapeutic phlebotomy may be used to treat the following medical conditions:  Hemochromatosis. This is a condition in which the blood contains too much iron.  Polycythemia vera. This is a condition in which the blood contains too many red blood cells.  Porphyria cutanea tarda. This is a disease in which an important part of hemoglobin is not made properly. It results in the buildup of abnormal amounts of porphyrins in the body.  Sickle cell disease. This is a condition in which the red blood cells form an abnormal crescent shape rather than a round shape. Tell a health care provider about:  Any allergies you have.  All medicines you are taking, including vitamins, herbs, eye drops, creams, and over-the-counter medicines.  Any problems you or family members have had with anesthetic medicines.  Any blood disorders you have.  Any surgeries you have had.  Any medical conditions you have.  Whether you are pregnant or may be pregnant. What are the risks? Generally, this is a safe procedure. However, problems may occur, including:  Nausea or light-headedness.  Low blood pressure (hypotension).  Soreness, bleeding, swelling, or bruising at the needle insertion site.  Infection. What happens before the procedure?  Follow instructions from your health care provider about eating or drinking restrictions.  Ask your health care provider about: ? Changing or stopping your regular medicines. This is especially important if you are taking diabetes medicines or blood thinners (anticoagulants). ? Taking medicines such as aspirin and ibuprofen. These medicines can thin your  blood. Do not take these medicines unless your health care provider tells you to take them. ? Taking over-the-counter medicines, vitamins, herbs, and supplements.  Wear clothing with sleeves that can be raised above the elbow.  Plan to have someone take you home from the hospital or clinic.  You may have a blood sample taken.  Your blood pressure, pulse rate, and breathing rate will be measured. What happens during the procedure?   To lower your risk of infection: ? Your health care team will wash or sanitize their hands. ? Your skin will be cleaned with an antiseptic.  You may be given a medicine to numb the area (local anesthetic).  A tourniquet will be placed on your arm.  A needle will be inserted into one of your veins.  Tubing and a collection bag will be attached to that needle.  Blood will flow through the needle and tubing into the collection bag.  The collection bag will be placed lower than your arm to allow gravity to help the flow of blood into the bag.  You may be asked to open and close your hand slowly and continually during the entire collection.  After the specified amount of blood has been removed from your body, the collection bag and tubing will be clamped.  The needle will be removed from your vein.  Pressure will be held on the site of the needle insertion to stop the bleeding.  A bandage (dressing) will be placed over the needle insertion site. The procedure may vary among health care providers and hospitals. What happens after the procedure?  Your blood pressure, pulse rate, and breathing rate will be   measured after the procedure.  You will be encouraged to drink fluids.  Your recovery will be assessed and monitored.  You can return to your normal activities as told by your health care provider. Summary  Therapeutic phlebotomy is the planned removal of blood from a person's body for the purpose of treating a medical condition.  Therapeutic  phlebotomy may be used to treat hemochromatosis, polycythemia vera, porphyria cutanea tarda, or sickle cell disease.  In the procedure, a needle is inserted and about a pint (470 mL, or 0.47 L) of blood is removed. The average adult has 9-12 pints (4.3-5.7 L) of blood in the body.  This is generally a safe procedure, but it can sometimes cause problems such as nausea, light-headedness, or low blood pressure (hypotension). This information is not intended to replace advice given to you by your health care provider. Make sure you discuss any questions you have with your health care provider. Document Revised: 01/07/2017 Document Reviewed: 01/07/2017 Elsevier Patient Education  2020 Elsevier Inc.  

## 2019-02-27 NOTE — Progress Notes (Signed)
Dean Pratt presents today for phlebotomy per PA Cassie orders. Phlebotomy procedure started at 1212 and ended at 1219. 550 grams removed. Patient observed for 30 minutes after procedure without any incident. Patient tolerated procedure well.  VSS.  Able to eat and drink w/out any issues. 16 G LAC IV needle removed intact.

## 2019-03-13 ENCOUNTER — Ambulatory Visit: Payer: Medicare HMO | Attending: Internal Medicine

## 2019-03-13 DIAGNOSIS — Z23 Encounter for immunization: Secondary | ICD-10-CM | POA: Insufficient documentation

## 2019-03-13 NOTE — Progress Notes (Signed)
   Covid-19 Vaccination Clinic  Name:  Dean Pratt    MRN: [id number] DOB: 10/08/1935  03/13/2019  [name] was observed post Covid-19 immunization for 15 minutes without incident. He was provided with Vaccine Information Sheet and instruction to access the V-Safe system.   [name] was instructed to call 911 with any severe reactions post vaccine: Marland Kitchen Difficulty breathing  . Swelling of face and throat  . A fast heartbeat  . A bad rash all over body  . Dizziness and weakness   Immunizations Administered    Name Date Dose VIS Date Route   Pfizer COVID-19 Vaccine 03/13/2019  3:17 PM 0.3 mL 12/16/2018 Intramuscular   Manufacturer: [place]   Lot: [id number]   [place]: [id number]

## 2019-03-14 DIAGNOSIS — I10 Essential (primary) hypertension: Secondary | ICD-10-CM | POA: Diagnosis not present

## 2019-03-14 DIAGNOSIS — E7849 Other hyperlipidemia: Secondary | ICD-10-CM | POA: Diagnosis not present

## 2019-03-14 DIAGNOSIS — E039 Hypothyroidism, unspecified: Secondary | ICD-10-CM | POA: Diagnosis not present

## 2019-03-14 DIAGNOSIS — Z0000 Encounter for general adult medical examination without abnormal findings: Secondary | ICD-10-CM | POA: Diagnosis not present

## 2019-03-14 DIAGNOSIS — M109 Gout, unspecified: Secondary | ICD-10-CM | POA: Diagnosis not present

## 2019-03-16 DIAGNOSIS — I10 Essential (primary) hypertension: Secondary | ICD-10-CM | POA: Diagnosis not present

## 2019-03-16 DIAGNOSIS — R82998 Other abnormal findings in urine: Secondary | ICD-10-CM | POA: Diagnosis not present

## 2019-03-21 DIAGNOSIS — Z1331 Encounter for screening for depression: Secondary | ICD-10-CM | POA: Diagnosis not present

## 2019-03-21 DIAGNOSIS — E291 Testicular hypofunction: Secondary | ICD-10-CM | POA: Diagnosis not present

## 2019-03-21 DIAGNOSIS — D692 Other nonthrombocytopenic purpura: Secondary | ICD-10-CM | POA: Diagnosis not present

## 2019-03-21 DIAGNOSIS — N182 Chronic kidney disease, stage 2 (mild): Secondary | ICD-10-CM | POA: Diagnosis not present

## 2019-03-21 DIAGNOSIS — I129 Hypertensive chronic kidney disease with stage 1 through stage 4 chronic kidney disease, or unspecified chronic kidney disease: Secondary | ICD-10-CM | POA: Diagnosis not present

## 2019-03-21 DIAGNOSIS — I480 Paroxysmal atrial fibrillation: Secondary | ICD-10-CM | POA: Diagnosis not present

## 2019-03-21 DIAGNOSIS — Z0000 Encounter for general adult medical examination without abnormal findings: Secondary | ICD-10-CM | POA: Diagnosis not present

## 2019-03-21 DIAGNOSIS — D751 Secondary polycythemia: Secondary | ICD-10-CM | POA: Diagnosis not present

## 2019-03-21 DIAGNOSIS — H40009 Preglaucoma, unspecified, unspecified eye: Secondary | ICD-10-CM | POA: Diagnosis not present

## 2019-03-21 DIAGNOSIS — R413 Other amnesia: Secondary | ICD-10-CM | POA: Diagnosis not present

## 2019-03-21 DIAGNOSIS — K635 Polyp of colon: Secondary | ICD-10-CM | POA: Diagnosis not present

## 2019-03-28 ENCOUNTER — Telehealth: Payer: Self-pay | Admitting: Internal Medicine

## 2019-03-28 ENCOUNTER — Inpatient Hospital Stay: Payer: Medicare HMO

## 2019-03-28 ENCOUNTER — Other Ambulatory Visit: Payer: Self-pay

## 2019-03-28 ENCOUNTER — Encounter: Payer: Self-pay | Admitting: Internal Medicine

## 2019-03-28 ENCOUNTER — Inpatient Hospital Stay: Payer: Medicare HMO | Attending: Internal Medicine | Admitting: Internal Medicine

## 2019-03-28 VITALS — BP 118/75 | HR 66 | Temp 98.7°F | Resp 18 | Ht 70.0 in | Wt 160.3 lb

## 2019-03-28 DIAGNOSIS — Z7982 Long term (current) use of aspirin: Secondary | ICD-10-CM | POA: Diagnosis not present

## 2019-03-28 DIAGNOSIS — Z79899 Other long term (current) drug therapy: Secondary | ICD-10-CM | POA: Insufficient documentation

## 2019-03-28 DIAGNOSIS — K219 Gastro-esophageal reflux disease without esophagitis: Secondary | ICD-10-CM | POA: Insufficient documentation

## 2019-03-28 DIAGNOSIS — Z85828 Personal history of other malignant neoplasm of skin: Secondary | ICD-10-CM | POA: Diagnosis not present

## 2019-03-28 DIAGNOSIS — D45 Polycythemia vera: Secondary | ICD-10-CM

## 2019-03-28 DIAGNOSIS — Z8673 Personal history of transient ischemic attack (TIA), and cerebral infarction without residual deficits: Secondary | ICD-10-CM | POA: Diagnosis not present

## 2019-03-28 DIAGNOSIS — Z791 Long term (current) use of non-steroidal anti-inflammatories (NSAID): Secondary | ICD-10-CM | POA: Insufficient documentation

## 2019-03-28 DIAGNOSIS — I10 Essential (primary) hypertension: Secondary | ICD-10-CM | POA: Insufficient documentation

## 2019-03-28 DIAGNOSIS — M109 Gout, unspecified: Secondary | ICD-10-CM | POA: Insufficient documentation

## 2019-03-28 DIAGNOSIS — N400 Enlarged prostate without lower urinary tract symptoms: Secondary | ICD-10-CM | POA: Diagnosis not present

## 2019-03-28 DIAGNOSIS — I480 Paroxysmal atrial fibrillation: Secondary | ICD-10-CM | POA: Insufficient documentation

## 2019-03-28 DIAGNOSIS — E785 Hyperlipidemia, unspecified: Secondary | ICD-10-CM | POA: Insufficient documentation

## 2019-03-28 LAB — CBC WITH DIFFERENTIAL (CANCER CENTER ONLY)
Abs Immature Granulocytes: 0.02 10*3/uL (ref 0.00–0.07)
Basophils Absolute: 0.1 10*3/uL (ref 0.0–0.1)
Basophils Relative: 1 %
Eosinophils Absolute: 0.1 10*3/uL (ref 0.0–0.5)
Eosinophils Relative: 1 %
HCT: 48.4 % (ref 39.0–52.0)
Hemoglobin: 14.3 g/dL (ref 13.0–17.0)
Immature Granulocytes: 0 %
Lymphocytes Relative: 14 %
Lymphs Abs: 1.3 10*3/uL (ref 0.7–4.0)
MCH: 20.6 pg — ABNORMAL LOW (ref 26.0–34.0)
MCHC: 29.5 g/dL — ABNORMAL LOW (ref 30.0–36.0)
MCV: 69.6 fL — ABNORMAL LOW (ref 80.0–100.0)
Monocytes Absolute: 0.6 10*3/uL (ref 0.1–1.0)
Monocytes Relative: 7 %
Neutro Abs: 7.1 10*3/uL (ref 1.7–7.7)
Neutrophils Relative %: 77 %
Platelet Count: 538 10*3/uL — ABNORMAL HIGH (ref 150–400)
RBC: 6.95 MIL/uL — ABNORMAL HIGH (ref 4.22–5.81)
RDW: 15.9 % — ABNORMAL HIGH (ref 11.5–15.5)
WBC Count: 9.3 10*3/uL (ref 4.0–10.5)
nRBC: 0 % (ref 0.0–0.2)

## 2019-03-28 LAB — LACTATE DEHYDROGENASE: LDH: 164 U/L (ref 98–192)

## 2019-03-28 NOTE — Progress Notes (Signed)
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    PRIOR THERAPY: None  CURRENT THERAPY: Phlebotomy on as-needed basis.  INTERVAL HISTORY: Dean Pratt 84 y.o. male returns to the clinic today for follow-up visit.  The patient is feeling fine today with no concerning complaints.  He denied having any chest pain, shortness of breath, cough or hemoptysis.  He denied having any fever or chills.  He has no nausea, vomiting, diarrhea or constipation.  He has no headache or visual changes.  He is currently on observation.  The patient is here today for evaluation with repeat CBC.  MEDICAL HISTORY: Past Medical History:  Diagnosis Date  . Anxiety   . Atrial flutter ([place])    s/p afib and atrial flutter ablation 04/09/09  . Blood transfusion without reported diagnosis   . BPH (benign prostatic hyperplasia)   . Cancer ([place])    basal cell CA removed  . Cataract    removed with lens implants   . Colitis, ischemic ([place])    secondary to ebolism from afib 1/10  . Constipation   . GERD (gastroesophageal reflux disease)   . Gout   . HTN (hypertension)   . Hyperlipidemia   . Intracranial bleed ([place]) 04/10/10   Right occipital hematoma with a left homonymous hemianopsia   . Migraines   . Paroxysmal atrial fibrillation ([place])    s/p PVI 04/09/09 and 10/17/10  . Rosacea   . Stroke ([place]) 04/08/2010   hemorragic, anticoagulation stopped at that time  . Tuberculosis    s/p treatment 1964    ALLERGIES:  is allergic to ace inhibitors and warfarin and related.  MEDICATIONS:  Current Outpatient Medications  Medication Sig Dispense Refill  . acetaminophen (TYLENOL) 325 MG tablet Take 650 mg by mouth every 6 (six) hours as needed.      Marland Kitchen allopurinol (ZYLOPRIM) 100 MG tablet Take 100 mg by mouth at bedtime.      . Ascorbic Acid  (VITAMIN C) 1000 MG tablet Take 1,000 mg by mouth daily.     Marland Kitchen aspirin 81 MG tablet Take 81 mg by mouth daily.      . clobetasol (TEMOVATE) 0.05 % external solution APPLY TO AFECTED AREAS (SCALP) TWICE A DAY AS NEEDED NOT FOR FACE,GROIN,UNDER ARMS    . diclofenac sodium (VOLTAREN) 1 % GEL APPLY 2-4 GRAMS 2-3 TIMES DAILY AS NEEDED FOR PAIN 100 g 0  . diltiazem (CARDIZEM CD) 240 MG 24 hr capsule TAKE 1 CAPSULE BY MOUTH EVERY DAY 30 capsule 0  . fluticasone (CUTIVATE) 0.05 % cream Apply 1 application topically daily.    Marland Kitchen ibuprofen (ADVIL,MOTRIN) 200 MG tablet Take 200 mg by mouth every 6 (six) hours as needed.    Marland Kitchen LORazepam (ATIVAN) 1 MG tablet TAKE 1/2-1 TABLET TWICE DAILY AS NEEDED  3  . losartan (COZAAR) 25 MG tablet Take 1 tablet (25 mg total) by mouth daily. 90 tablet 3  . NON FORMULARY Stool softener 100 mg... Take 1 capsule by mouth once daily.     . Olopatadine HCl 0.2 % SOLN INSTILL 1 DROP INTO BOTH EYES TWICE A DAY    . rosuvastatin (CRESTOR) 10 MG tablet Take 10 mg by mouth daily.  2   Current Facility-Administered Medications  Medication Dose Route Frequency Provider Last Rate Last Admin  . 0.9 %  sodium chloride infusion  500 mL Intravenous Once Irene Shipper, MD      . bupivacaine (MARCAINE) 0.5 % (with pres) injection 3 mL  3 mL Other Once Magnus Sinning, MD        SURGICAL HISTORY:  Past Surgical History:  Procedure Laterality Date  . ablation  04/09/2009   s/p afib and atrial flutter ablation by JA  . ABLATION OF DYSRHYTHMIC FOCUS  10/15/09   repeat afib ablation by [facility]  . APPENDECTOMY    . CATARACT EXTRACTION    . CATARACT EXTRACTION, BILATERAL     with lens implants   . COLONOSCOPY    . CORNEAL LACERATION REPAIR    . EYE SURGERY    . INGUINAL HERNIA REPAIR    . POLYPECTOMY    . TONSILLECTOMY    . VASECTOMY      REVIEW OF SYSTEMS:  A comprehensive review of systems was negative except for: Constitutional: positive for fatigue   PHYSICAL EXAMINATION: General  appearance: alert, cooperative and no distress Head: Normocephalic, without obvious abnormality, atraumatic Neck: no adenopathy, no JVD, supple, symmetrical, trachea midline and thyroid not enlarged, symmetric, no tenderness/mass/nodules Lymph nodes: Cervical, supraclavicular, and axillary nodes normal. Resp: clear to auscultation bilaterally Back: symmetric, no curvature. ROM normal. No CVA tenderness. Cardio: regular rate and rhythm, S1, S2 normal, no murmur, click, rub or gallop GI: soft, non-tender; bowel sounds normal; no masses,  no organomegaly Extremities: extremities normal, atraumatic, no cyanosis or edema  ECOG PERFORMANCE STATUS: 1 - Symptomatic but completely ambulatory  Blood pressure 118/75, pulse 66, temperature 98.7 F (37.1 C), temperature source Temporal, resp. rate 18, height 5\' 10"  (1.778 m), weight 160 lb 4.8 oz (72.7 kg), SpO2 99 %.  LABORATORY DATA: Lab Results  Component Value Date   WBC 9.3 03/28/2019   HGB 14.3 03/28/2019   HCT 48.4 03/28/2019   MCV 69.6 (L) 03/28/2019   PLT 538 (H) 03/28/2019      Chemistry      Component Value Date/Time   NA 141 01/03/2019 0828   K 4.1 01/03/2019 0828   CL 106 01/03/2019 0828   CO2 24 01/03/2019 0828   BUN 13 01/03/2019 0828   CREATININE 1.12 01/03/2019 0828      Component Value Date/Time   CALCIUM 9.5 01/03/2019 0828   ALKPHOS 70 01/03/2019 0828   AST 16 01/03/2019 0828   ALT 15 01/03/2019 0828   BILITOT 0.7 01/03/2019 0828       RADIOGRAPHIC STUDIES: No results found.  ASSESSMENT AND PLAN: This is a very pleasant 84 years old white male with recently diagnosed polycythemia vera with positive Jak 2 mutation.  The patient is also currently on hormonal therapy for androgen deficiency. CBC today showed hematocrit of 48.4%.  He has evidence for iron deficiency.  I will not consider him for phlebotomy today. I recommended for the patient to continue on observation with repeat CBC, LDH and phlebotomy if needed  in 6 weeks. The patient was advised to call immediately if he has any concerning symptoms in the interval. The patient voices understanding of current disease status and treatment options and is in agreement with the current care plan. All questions were answered. The patient knows to call the clinic with any problems, questions or concerns. We can certainly see the patient much sooner if necessary.  Disclaimer: This note was dictated with voice recognition software. Similar sounding words can inadvertently be transcribed and may not be corrected upon review.

## 2019-03-28 NOTE — Telephone Encounter (Signed)
Scheduled per los. Gave avs and calendar  

## 2019-03-31 DIAGNOSIS — Z1212 Encounter for screening for malignant neoplasm of rectum: Secondary | ICD-10-CM | POA: Diagnosis not present

## 2019-04-17 ENCOUNTER — Other Ambulatory Visit: Payer: Self-pay | Admitting: Cardiology

## 2019-04-19 NOTE — Telephone Encounter (Signed)
This is a [facility] pt 

## 2019-04-21 ENCOUNTER — Other Ambulatory Visit: Payer: Self-pay | Admitting: Internal Medicine

## 2019-05-04 DIAGNOSIS — H524 Presbyopia: Secondary | ICD-10-CM | POA: Diagnosis not present

## 2019-05-04 DIAGNOSIS — H1045 Other chronic allergic conjunctivitis: Secondary | ICD-10-CM | POA: Diagnosis not present

## 2019-05-04 DIAGNOSIS — H52203 Unspecified astigmatism, bilateral: Secondary | ICD-10-CM | POA: Diagnosis not present

## 2019-05-04 DIAGNOSIS — L719 Rosacea, unspecified: Secondary | ICD-10-CM | POA: Diagnosis not present

## 2019-05-04 DIAGNOSIS — H04123 Dry eye syndrome of bilateral lacrimal glands: Secondary | ICD-10-CM | POA: Diagnosis not present

## 2019-05-04 DIAGNOSIS — H33011 Retinal detachment with single break, right eye: Secondary | ICD-10-CM | POA: Diagnosis not present

## 2019-05-04 DIAGNOSIS — Z961 Presence of intraocular lens: Secondary | ICD-10-CM | POA: Diagnosis not present

## 2019-05-04 DIAGNOSIS — H5211 Myopia, right eye: Secondary | ICD-10-CM | POA: Diagnosis not present

## 2019-05-09 ENCOUNTER — Inpatient Hospital Stay: Payer: Medicare HMO

## 2019-05-09 ENCOUNTER — Encounter: Payer: Self-pay | Admitting: Internal Medicine

## 2019-05-09 ENCOUNTER — Inpatient Hospital Stay: Payer: Medicare HMO | Attending: Internal Medicine | Admitting: Internal Medicine

## 2019-05-09 ENCOUNTER — Other Ambulatory Visit: Payer: Self-pay

## 2019-05-09 VITALS — BP 129/81 | HR 67 | Temp 98.7°F | Resp 17 | Ht 70.0 in | Wt 161.2 lb

## 2019-05-09 DIAGNOSIS — E611 Iron deficiency: Secondary | ICD-10-CM | POA: Diagnosis not present

## 2019-05-09 DIAGNOSIS — E785 Hyperlipidemia, unspecified: Secondary | ICD-10-CM | POA: Diagnosis not present

## 2019-05-09 DIAGNOSIS — D45 Polycythemia vera: Secondary | ICD-10-CM | POA: Diagnosis not present

## 2019-05-09 DIAGNOSIS — M109 Gout, unspecified: Secondary | ICD-10-CM | POA: Diagnosis not present

## 2019-05-09 DIAGNOSIS — I480 Paroxysmal atrial fibrillation: Secondary | ICD-10-CM | POA: Insufficient documentation

## 2019-05-09 DIAGNOSIS — Z85828 Personal history of other malignant neoplasm of skin: Secondary | ICD-10-CM | POA: Diagnosis not present

## 2019-05-09 DIAGNOSIS — N400 Enlarged prostate without lower urinary tract symptoms: Secondary | ICD-10-CM | POA: Insufficient documentation

## 2019-05-09 DIAGNOSIS — Z791 Long term (current) use of non-steroidal anti-inflammatories (NSAID): Secondary | ICD-10-CM | POA: Insufficient documentation

## 2019-05-09 DIAGNOSIS — I10 Essential (primary) hypertension: Secondary | ICD-10-CM | POA: Diagnosis not present

## 2019-05-09 DIAGNOSIS — Z8673 Personal history of transient ischemic attack (TIA), and cerebral infarction without residual deficits: Secondary | ICD-10-CM | POA: Insufficient documentation

## 2019-05-09 DIAGNOSIS — Z79899 Other long term (current) drug therapy: Secondary | ICD-10-CM | POA: Insufficient documentation

## 2019-05-09 DIAGNOSIS — K219 Gastro-esophageal reflux disease without esophagitis: Secondary | ICD-10-CM | POA: Diagnosis not present

## 2019-05-09 DIAGNOSIS — Z7982 Long term (current) use of aspirin: Secondary | ICD-10-CM | POA: Insufficient documentation

## 2019-05-09 LAB — CBC WITH DIFFERENTIAL (CANCER CENTER ONLY)
Abs Immature Granulocytes: 0.02 10*3/uL (ref 0.00–0.07)
Basophils Absolute: 0.1 10*3/uL (ref 0.0–0.1)
Basophils Relative: 2 %
Eosinophils Absolute: 0.2 10*3/uL (ref 0.0–0.5)
Eosinophils Relative: 2 %
HCT: 47.5 % (ref 39.0–52.0)
Hemoglobin: 13.9 g/dL (ref 13.0–17.0)
Immature Granulocytes: 0 %
Lymphocytes Relative: 17 %
Lymphs Abs: 1.3 10*3/uL (ref 0.7–4.0)
MCH: 19.8 pg — ABNORMAL LOW (ref 26.0–34.0)
MCHC: 29.3 g/dL — ABNORMAL LOW (ref 30.0–36.0)
MCV: 67.7 fL — ABNORMAL LOW (ref 80.0–100.0)
Monocytes Absolute: 0.6 10*3/uL (ref 0.1–1.0)
Monocytes Relative: 8 %
Neutro Abs: 5.4 10*3/uL (ref 1.7–7.7)
Neutrophils Relative %: 71 %
Platelet Count: 579 10*3/uL — ABNORMAL HIGH (ref 150–400)
RBC: 7.02 MIL/uL — ABNORMAL HIGH (ref 4.22–5.81)
RDW: 17.8 % — ABNORMAL HIGH (ref 11.5–15.5)
WBC Count: 7.6 10*3/uL (ref 4.0–10.5)
nRBC: 0 % (ref 0.0–0.2)

## 2019-05-09 LAB — LACTATE DEHYDROGENASE: LDH: 119 U/L (ref 98–192)

## 2019-05-09 NOTE — Progress Notes (Signed)
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    PRIOR THERAPY: None  CURRENT THERAPY: Phlebotomy on as-needed basis.  INTERVAL HISTORY: Dean Pratt 84 y.o. male returns to the clinic today for follow-up visit.  The patient is feeling fine today with no concerning complaints.  He has no chest pain, shortness of breath, cough or hemoptysis.  He denied having any nausea, vomiting, diarrhea or constipation.  He has no headache or visual changes.  Is here today for evaluation and repeat blood work.      MEDICAL HISTORY: Past Medical History:  Diagnosis Date  . Anxiety   . Atrial flutter ([place])    s/p afib and atrial flutter ablation 04/09/09  . Blood transfusion without reported diagnosis   . BPH (benign prostatic hyperplasia)   . Cancer ([place])    basal cell CA removed  . Cataract    removed with lens implants   . Colitis, ischemic ([place])    secondary to ebolism from afib 1/10  . Constipation   . GERD (gastroesophageal reflux disease)   . Gout   . HTN (hypertension)   . Hyperlipidemia   . Intracranial bleed ([place]) 04/10/10   Right occipital hematoma with a left homonymous hemianopsia   . Migraines   . Paroxysmal atrial fibrillation ([place])    s/p PVI 04/09/09 and 10/17/10  . Rosacea   . Stroke ([place]) 04/08/2010   hemorragic, anticoagulation stopped at that time  . Tuberculosis    s/p treatment 1964    ALLERGIES:  is allergic to ace inhibitors and warfarin and related.  MEDICATIONS:  Current Outpatient Medications  Medication Sig Dispense Refill  . acetaminophen (TYLENOL) 325 MG tablet Take 650 mg by mouth every 6 (six) hours as needed.      Marland Kitchen allopurinol (ZYLOPRIM) 100 MG tablet Take 100 mg by mouth at bedtime.      . Ascorbic Acid (VITAMIN C) 1000 MG tablet Take 1,000 mg by mouth daily.     Marland Kitchen aspirin 81 MG  tablet Take 81 mg by mouth daily.      . clobetasol (TEMOVATE) 0.05 % external solution APPLY TO AFECTED AREAS (SCALP) TWICE A DAY AS NEEDED NOT FOR FACE,GROIN,UNDER ARMS    . diclofenac sodium (VOLTAREN) 1 % GEL APPLY 2-4 GRAMS 2-3 TIMES DAILY AS NEEDED FOR PAIN 100 g 0  . diltiazem (CARDIZEM CD) 240 MG 24 hr capsule Take 1 capsule (240 mg total) by mouth daily. PLEASE CALL OFFICE TO SCHEDULE APPOINTMENT FOR FURTHER REFILLS 30 capsule 0  . fluticasone (CUTIVATE) 0.05 % cream Apply 1 application topically daily.    Marland Kitchen ibuprofen (ADVIL,MOTRIN) 200 MG tablet Take 200 mg by mouth every 6 (six) hours as needed.    Marland Kitchen LORazepam (ATIVAN) 1 MG tablet TAKE 1/2-1 TABLET TWICE DAILY AS NEEDED  3  . losartan (COZAAR) 25 MG tablet TAKE 1 TABLET BY MOUTH EVERY DAY 90 tablet 2  . NON FORMULARY Stool softener 100 mg... Take 1 capsule by mouth once daily.     . Olopatadine HCl 0.2 % SOLN INSTILL 1 DROP INTO BOTH EYES TWICE A DAY    . rosuvastatin (CRESTOR) 10 MG tablet Take 10 mg by mouth daily.  2   Current Facility-Administered Medications  Medication Dose Route Frequency Provider Last Rate Last Admin  . 0.9 %  sodium chloride infusion  500 mL Intravenous Once Irene Shipper, MD      . bupivacaine (MARCAINE) 0.5 % (with pres) injection 3 mL  3 mL Other Once Magnus Sinning, MD        SURGICAL HISTORY:  Past Surgical History:  Procedure Laterality Date  . ablation  04/09/2009   s/p afib and atrial flutter ablation by [facility]  . ABLATION OF DYSRHYTHMIC FOCUS  10/15/09   repeat afib ablation by [facility]  . APPENDECTOMY    . CATARACT EXTRACTION    . CATARACT EXTRACTION, BILATERAL     with lens implants   . COLONOSCOPY    . CORNEAL LACERATION REPAIR    . EYE SURGERY    . INGUINAL HERNIA REPAIR    . POLYPECTOMY    . TONSILLECTOMY    . VASECTOMY      REVIEW OF SYSTEMS:  A comprehensive review of systems was negative.   PHYSICAL EXAMINATION: General appearance: alert, cooperative and no distress Head:  Normocephalic, without obvious abnormality, atraumatic Neck: no adenopathy, no JVD, supple, symmetrical, trachea midline and thyroid not enlarged, symmetric, no tenderness/mass/nodules Lymph nodes: Cervical, supraclavicular, and axillary nodes normal. Resp: clear to auscultation bilaterally Back: symmetric, no curvature. ROM normal. No CVA tenderness. Cardio: regular rate and rhythm, S1, S2 normal, no murmur, click, rub or gallop GI: soft, non-tender; bowel sounds normal; no masses,  no organomegaly Extremities: extremities normal, atraumatic, no cyanosis or edema  ECOG PERFORMANCE STATUS: 1 - Symptomatic but completely ambulatory  Blood pressure 129/81, pulse 67, temperature 98.7 F (37.1 C), temperature source Oral, resp. rate 17, height 5\' 10"  (1.778 m), weight 161 lb 3.2 oz (73.1 kg), SpO2 97 %.  LABORATORY DATA: Lab Results  Component Value Date   WBC 7.6 05/09/2019   HGB 13.9 05/09/2019   HCT 47.5 05/09/2019   MCV 67.7 (L) 05/09/2019   PLT 579 (H) 05/09/2019      Chemistry      Component Value Date/Time   NA 141 01/03/2019 0828   K 4.1 01/03/2019 0828   CL 106 01/03/2019 0828   CO2 24 01/03/2019 0828   BUN 13 01/03/2019 0828   CREATININE 1.12 01/03/2019 0828      Component Value Date/Time   CALCIUM 9.5 01/03/2019 0828   ALKPHOS 70 01/03/2019 0828   AST 16 01/03/2019 0828   ALT 15 01/03/2019 0828   BILITOT 0.7 01/03/2019 0828       RADIOGRAPHIC STUDIES: No results found.  ASSESSMENT AND PLAN: This is a very pleasant 84 years old white male with recently diagnosed polycythemia vera with positive Jak 2 mutation.  The patient is also currently on hormonal therapy for androgen deficiency. CBC today showed hematocrit of 47.5%.  The patient also has microcytosis secondary to iron deficiency. I recommended for him to continue on observation with no phlebotomy today. He will come back for follow-up visit in 6 weeks for evaluation and repeat blood work and phlebotomy if  needed. He was advised to call immediately if he has any concerning symptoms in the interval. The patient voices understanding of current disease status and treatment options and is in agreement with the current care plan. All questions were answered. The patient knows to call the clinic with any problems, questions or concerns. We can certainly see the patient much sooner if necessary.  Disclaimer: This note was dictated with voice recognition software. Similar sounding words can inadvertently be transcribed and may not be corrected upon review.

## 2019-05-10 ENCOUNTER — Telehealth: Payer: Self-pay | Admitting: Internal Medicine

## 2019-05-10 NOTE — Telephone Encounter (Signed)
Scheduled per los. Called, not able to leave msg. Mailed printout  

## 2019-05-12 ENCOUNTER — Other Ambulatory Visit: Payer: Self-pay | Admitting: Cardiology

## 2019-05-25 ENCOUNTER — Other Ambulatory Visit: Payer: Self-pay | Admitting: Cardiology

## 2019-05-28 ENCOUNTER — Other Ambulatory Visit (INDEPENDENT_AMBULATORY_CARE_PROVIDER_SITE_OTHER): Payer: Self-pay | Admitting: Physical Medicine and Rehabilitation

## 2019-05-29 NOTE — Telephone Encounter (Signed)
Please advise 

## 2019-05-30 ENCOUNTER — Telehealth: Payer: Self-pay | Admitting: Physical Medicine and Rehabilitation

## 2019-05-30 ENCOUNTER — Other Ambulatory Visit: Payer: Self-pay | Admitting: Physical Medicine and Rehabilitation

## 2019-05-30 MED ORDER — METHOCARBAMOL 500 MG PO TABS: 500.0000 mg | ORAL_TABLET | Freq: Three times a day (TID) | ORAL | 2 refills | Status: DC | PRN

## 2019-05-30 NOTE — Telephone Encounter (Signed)
New rx sent

## 2019-06-06 ENCOUNTER — Telehealth: Payer: Self-pay | Admitting: Physical Medicine and Rehabilitation

## 2019-06-06 NOTE — Telephone Encounter (Signed)
Patient had bilateral L2-3 and L3-4 RFA in 2020. Ok to get auth for repeat if same problem?

## 2019-06-06 NOTE — Telephone Encounter (Signed)
Service [id number] Authorization [id number] Auth Effective Date:06/01/2021Auth End Date:11/28/2021Initiated Date:06/01/2021Decision Date:06/01/2021Decision Type :InitialCase Status:Approved  Pt is scheduled for 07/05/19 and 07/24/19 with driver.

## 2019-06-06 NOTE — Telephone Encounter (Signed)
Yes if they will approve without him seen

## 2019-06-06 NOTE — Telephone Encounter (Signed)
Patient called.   Requesting a call back to schedule an appointment.   Call back: [phone]

## 2019-06-08 DIAGNOSIS — I4891 Unspecified atrial fibrillation: Secondary | ICD-10-CM | POA: Diagnosis not present

## 2019-06-08 DIAGNOSIS — I25119 Atherosclerotic heart disease of native coronary artery with unspecified angina pectoris: Secondary | ICD-10-CM | POA: Diagnosis not present

## 2019-06-08 DIAGNOSIS — D6869 Other thrombophilia: Secondary | ICD-10-CM | POA: Diagnosis not present

## 2019-06-08 DIAGNOSIS — H04129 Dry eye syndrome of unspecified lacrimal gland: Secondary | ICD-10-CM | POA: Diagnosis not present

## 2019-06-08 DIAGNOSIS — G8929 Other chronic pain: Secondary | ICD-10-CM | POA: Diagnosis not present

## 2019-06-08 DIAGNOSIS — K5900 Constipation, unspecified: Secondary | ICD-10-CM | POA: Diagnosis not present

## 2019-06-08 DIAGNOSIS — R69 Illness, unspecified: Secondary | ICD-10-CM | POA: Diagnosis not present

## 2019-06-08 DIAGNOSIS — I10 Essential (primary) hypertension: Secondary | ICD-10-CM | POA: Diagnosis not present

## 2019-06-08 DIAGNOSIS — E785 Hyperlipidemia, unspecified: Secondary | ICD-10-CM | POA: Diagnosis not present

## 2019-06-20 ENCOUNTER — Inpatient Hospital Stay: Payer: Medicare HMO

## 2019-06-20 ENCOUNTER — Inpatient Hospital Stay: Payer: Medicare HMO | Attending: Internal Medicine | Admitting: Physician Assistant

## 2019-06-20 ENCOUNTER — Other Ambulatory Visit: Payer: Self-pay

## 2019-06-20 VITALS — BP 122/92 | HR 82 | Temp 97.9°F | Resp 18 | Ht 70.0 in | Wt 162.7 lb

## 2019-06-20 DIAGNOSIS — F419 Anxiety disorder, unspecified: Secondary | ICD-10-CM | POA: Insufficient documentation

## 2019-06-20 DIAGNOSIS — K219 Gastro-esophageal reflux disease without esophagitis: Secondary | ICD-10-CM | POA: Diagnosis not present

## 2019-06-20 DIAGNOSIS — Z7982 Long term (current) use of aspirin: Secondary | ICD-10-CM | POA: Insufficient documentation

## 2019-06-20 DIAGNOSIS — Z791 Long term (current) use of non-steroidal anti-inflammatories (NSAID): Secondary | ICD-10-CM | POA: Diagnosis not present

## 2019-06-20 DIAGNOSIS — N400 Enlarged prostate without lower urinary tract symptoms: Secondary | ICD-10-CM | POA: Insufficient documentation

## 2019-06-20 DIAGNOSIS — Z85828 Personal history of other malignant neoplasm of skin: Secondary | ICD-10-CM | POA: Insufficient documentation

## 2019-06-20 DIAGNOSIS — I10 Essential (primary) hypertension: Secondary | ICD-10-CM | POA: Insufficient documentation

## 2019-06-20 DIAGNOSIS — Z79899 Other long term (current) drug therapy: Secondary | ICD-10-CM | POA: Diagnosis not present

## 2019-06-20 DIAGNOSIS — E785 Hyperlipidemia, unspecified: Secondary | ICD-10-CM | POA: Diagnosis not present

## 2019-06-20 DIAGNOSIS — Z8673 Personal history of transient ischemic attack (TIA), and cerebral infarction without residual deficits: Secondary | ICD-10-CM | POA: Insufficient documentation

## 2019-06-20 DIAGNOSIS — R69 Illness, unspecified: Secondary | ICD-10-CM | POA: Diagnosis not present

## 2019-06-20 DIAGNOSIS — I480 Paroxysmal atrial fibrillation: Secondary | ICD-10-CM | POA: Insufficient documentation

## 2019-06-20 DIAGNOSIS — D45 Polycythemia vera: Secondary | ICD-10-CM | POA: Insufficient documentation

## 2019-06-20 DIAGNOSIS — M109 Gout, unspecified: Secondary | ICD-10-CM | POA: Insufficient documentation

## 2019-06-20 LAB — CBC WITH DIFFERENTIAL (CANCER CENTER ONLY)
Abs Immature Granulocytes: 0.02 10*3/uL (ref 0.00–0.07)
Basophils Absolute: 0.1 10*3/uL (ref 0.0–0.1)
Basophils Relative: 1 %
Eosinophils Absolute: 0.2 10*3/uL (ref 0.0–0.5)
Eosinophils Relative: 2 %
HCT: 48.7 % (ref 39.0–52.0)
Hemoglobin: 14.2 g/dL (ref 13.0–17.0)
Immature Granulocytes: 0 %
Lymphocytes Relative: 12 %
Lymphs Abs: 1.1 10*3/uL (ref 0.7–4.0)
MCH: 19.3 pg — ABNORMAL LOW (ref 26.0–34.0)
MCHC: 29.2 g/dL — ABNORMAL LOW (ref 30.0–36.0)
MCV: 66.3 fL — ABNORMAL LOW (ref 80.0–100.0)
Monocytes Absolute: 0.8 10*3/uL (ref 0.1–1.0)
Monocytes Relative: 8 %
Neutro Abs: 6.8 10*3/uL (ref 1.7–7.7)
Neutrophils Relative %: 77 %
Platelet Count: 520 10*3/uL — ABNORMAL HIGH (ref 150–400)
RBC: 7.35 MIL/uL — ABNORMAL HIGH (ref 4.22–5.81)
RDW: 19.6 % — ABNORMAL HIGH (ref 11.5–15.5)
WBC Count: 8.9 10*3/uL (ref 4.0–10.5)
nRBC: 0 % (ref 0.0–0.2)

## 2019-06-20 LAB — LACTATE DEHYDROGENASE: LDH: 145 U/L (ref 98–192)

## 2019-06-20 NOTE — Patient Instructions (Signed)

## 2019-06-20 NOTE — Progress Notes (Signed)
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  PRIOR THERAPY: None  CURRENT THERAPY: Phlebotomy on as-needed basis.  INTERVAL HISTORY: Dean Pratt 84 y.o. male returns to the clinic for a follow up visit. The patient is feelingwell todaywithout any concerning complaints except he recently had an episode of constipation with associated back and abdominal pain. He used magnesium citrate with resolution of his symptoms. He denies any headaches, visual changes, or dizziness. He denies any pruritus.Reports occassional epistaxis which occurs once every 1-2 monthsbut has not had any episodes in the interval since his last appointment. He has seen ENT in the past for this concern.He is on 81 mg of aspirin daily.He denies anyotherrecent bleeding or bruising including, melena, hematochezia, or hematuria. He denies any abdominal pain,early satiety,nausea, vomiting, diarrhea, or constipation. He denies any chest pain, shortness of breath, or hemoptysis.Denies flushing.He also mentions he is going to see an orthopedic provider for his chronic back trouble later this month. He is here for evaluation and repeat blood work.  MEDICAL HISTORY: Past Medical History:  Diagnosis Date  . Anxiety   . Atrial flutter ([place])    s/p afib and atrial flutter ablation 04/09/09  . Blood transfusion without reported diagnosis   . BPH (benign prostatic hyperplasia)   . Cancer ([place])    basal cell CA removed  . Cataract    removed with lens implants   . Colitis, ischemic ([place])    secondary to ebolism from afib 1/10  . Constipation   . GERD (gastroesophageal reflux disease)   . Gout   . HTN (hypertension)   . Hyperlipidemia   . Intracranial bleed ([place]) 04/10/10   Right occipital hematoma with a left homonymous hemianopsia   . Migraines   . Paroxysmal atrial fibrillation ([place])    s/p PVI  04/09/09 and 10/17/10  . Rosacea   . Stroke ([place]) 04/08/2010   hemorragic, anticoagulation stopped at that time  . Tuberculosis    s/p treatment 1964    ALLERGIES:  is allergic to ace inhibitors and warfarin and related.  MEDICATIONS:  Current Outpatient Medications  Medication Sig Dispense Refill  . acetaminophen (TYLENOL) 325 MG tablet Take 650 mg by mouth every 6 (six) hours as needed.      Marland Kitchen allopurinol (ZYLOPRIM) 100 MG tablet Take 100 mg by mouth at bedtime.      . Ascorbic Acid (VITAMIN C) 1000 MG tablet Take 1,000 mg by mouth daily.     Marland Kitchen aspirin 81 MG tablet Take 81 mg by mouth daily.      . clobetasol (TEMOVATE) 0.05 % external solution APPLY TO AFECTED AREAS (SCALP) TWICE A DAY AS NEEDED NOT FOR FACE,GROIN,UNDER ARMS    . diclofenac sodium (VOLTAREN) 1 % GEL APPLY 2-4 GRAMS 2-3 TIMES DAILY AS NEEDED FOR PAIN 100 g 0  . diltiazem (CARDIZEM CD) 240 MG 24 hr capsule TAKE 1 CAPSULE DAILY. PLEASE CALL OFFICE TO SCHEDULE APPOINTMENT FOR FURTHER REFILLS 15 capsule 0  . fluticasone (CUTIVATE) 0.05 % cream Apply 1 application topically daily.    Marland Kitchen ibuprofen (ADVIL,MOTRIN) 200 MG tablet Take 200 mg by mouth every 6 (six) hours as needed.    Marland Kitchen LORazepam (ATIVAN) 1 MG tablet TAKE 1/2-1 TABLET TWICE DAILY AS NEEDED  3  . losartan (COZAAR) 25 MG tablet TAKE 1 TABLET BY MOUTH EVERY DAY 90 tablet 2  . methocarbamol (ROBAXIN) 500  MG tablet Take 1 tablet (500 mg total) by mouth every 8 (eight) hours as needed for muscle spasms. 60 tablet 2  . NON FORMULARY Stool softener 100 mg... Take 1 capsule by mouth once daily.     . Olopatadine HCl 0.2 % SOLN INSTILL 1 DROP INTO BOTH EYES TWICE A DAY    . rosuvastatin (CRESTOR) 10 MG tablet Take 10 mg by mouth daily.  2   Current Facility-Administered Medications  Medication Dose Route Frequency Provider Last Rate Last Admin  . 0.9 %  sodium chloride infusion  500 mL Intravenous Once Irene Shipper, MD      . bupivacaine (MARCAINE) 0.5 % (with pres)  injection 3 mL  3 mL Other Once Magnus Sinning, MD        SURGICAL HISTORY:  Past Surgical History:  Procedure Laterality Date  . ablation  04/09/2009   s/p afib and atrial flutter ablation by JA  . ABLATION OF DYSRHYTHMIC FOCUS  10/15/09   repeat afib ablation by JA  . APPENDECTOMY    . CATARACT EXTRACTION    . CATARACT EXTRACTION, BILATERAL     with lens implants   . COLONOSCOPY    . CORNEAL LACERATION REPAIR    . EYE SURGERY    . INGUINAL HERNIA REPAIR    . POLYPECTOMY    . TONSILLECTOMY    . VASECTOMY      REVIEW OF SYSTEMS:   Review of Systems  Constitutional: Negative for appetite change, chills, fatigue, fever and unexpected weight change.  HENT: Negative for mouth sores, nosebleeds, sore throat and trouble swallowing.  Eyes: Negative for eye problems and icterus.  Respiratory: Negative for cough, hemoptysis, shortness of breath and wheezing.   Cardiovascular: Negative for chest pain and leg swelling.  Gastrointestinal: Negative for abdominal pain, constipation (resolved), diarrhea, nausea and vomiting.  Genitourinary: Negative for bladder incontinence, difficulty urinating, dysuria, frequency and hematuria.   Musculoskeletal: Negative for back pain, gait problem, neck pain and neck stiffness.  Skin: Negative for itching and rash.  Neurological: Negative for dizziness, extremity weakness, gait problem, headaches, light-headedness and seizures.  Hematological: Negative for adenopathy. Does not bruise/bleed easily.  Psychiatric/Behavioral: Negative for confusion, depression and sleep disturbance. The patient is not nervous/anxious.     PHYSICAL EXAMINATION:  Blood pressure (!) 122/92, pulse 82, temperature 97.9 F (36.6 C), temperature source Temporal, resp. rate 18, height 5\' 10"  (1.778 m), weight 162 lb 11.2 oz (73.8 kg), SpO2 99 %.  ECOG PERFORMANCE STATUS: 0 - Asymptomatic  Physical Exam  Constitutional: Oriented to person, place, and time and well-developed,  well-nourished, and in no distress.  HENT:  Head: Normocephalic and atraumatic.  Mouth/Throat: Oropharynx is clear and moist. No oropharyngeal exudate.  Eyes: Conjunctivae are normal. Right eye exhibits no discharge. Left eye exhibits no discharge. No scleral icterus.  Neck: Normal range of motion. Neck supple.  Cardiovascular: Normal rate, irregular rhythm, normal heart sounds and intact distal pulses.  Pulmonary/Chest: Effort normal and breath sounds normal. No respiratory distress. No wheezes. No rales.  Abdominal: Soft. Bowel sounds are normal. Exhibits no distension and no mass. There is no tenderness.  Musculoskeletal: Scoliosis noted on exam. Normal range of motion. Exhibits no edema.  Lymphadenopathy:    No cervical adenopathy.  Neurological: Alert and oriented to person, place, and time. Exhibits normal muscle tone. Gait normal. Coordination normal.  Skin: Lipoma on left upper back. Skin is warm and dry. No rash noted. Not diaphoretic. No erythema. No pallor.  Psychiatric: Mood, memory and judgment normal.  Vitals reviewed.  LABORATORY DATA: Lab Results  Component Value Date   WBC 8.9 06/20/2019   HGB 14.2 06/20/2019   HCT 48.7 06/20/2019   MCV 66.3 (L) 06/20/2019   PLT 520 (H) 06/20/2019      Chemistry      Component Value Date/Time   NA 141 01/03/2019 0828   K 4.1 01/03/2019 0828   CL 106 01/03/2019 0828   CO2 24 01/03/2019 0828   BUN 13 01/03/2019 0828   CREATININE 1.12 01/03/2019 0828      Component Value Date/Time   CALCIUM 9.5 01/03/2019 0828   ALKPHOS 70 01/03/2019 0828   AST 16 01/03/2019 0828   ALT 15 01/03/2019 0828   BILITOT 0.7 01/03/2019 0828       RADIOGRAPHIC STUDIES:  No results found.   ASSESSMENT/PLAN:  This is a very pleasant 84 year old Caucasian male diagnosed with polycythemia vera with a positive Jak 2 mutation.   The patient had repeat CBC performed today. His hematocrit is48.7.  We will arrange fora therapeutic phlebotomy  today. The goal is to keep his hematocrit ~45%.  We will see the patient back for follow-up visit in6weeks for evaluation and repeat blood work and therapeutic phlebotomy if needed.  The patient was advised to call immediately if he has any concerning symptoms in the interval. The patient voices understanding of current disease status and treatment options and is in agreement with the current care plan. All questions were answered. The patient knows to call the clinic with any problems, questions or concerns. We can certainly see the patient much sooner if necessary.   No orders of the defined types were placed in this encounter.    Cheyenne Schumm L Wrenn Willcox, PA-C 06/20/19

## 2019-06-23 ENCOUNTER — Telehealth: Payer: Self-pay | Admitting: Physician Assistant

## 2019-06-23 NOTE — Telephone Encounter (Signed)
Scheduled per los. Called and left msg. Mailed printout  °

## 2019-07-05 ENCOUNTER — Ambulatory Visit: Payer: Self-pay

## 2019-07-05 ENCOUNTER — Encounter: Payer: Self-pay | Admitting: Physical Medicine and Rehabilitation

## 2019-07-05 ENCOUNTER — Other Ambulatory Visit: Payer: Self-pay

## 2019-07-05 ENCOUNTER — Ambulatory Visit (INDEPENDENT_AMBULATORY_CARE_PROVIDER_SITE_OTHER): Payer: Medicare HMO | Admitting: Physical Medicine and Rehabilitation

## 2019-07-05 VITALS — BP 127/73 | HR 71

## 2019-07-05 DIAGNOSIS — M47816 Spondylosis without myelopathy or radiculopathy, lumbar region: Secondary | ICD-10-CM | POA: Diagnosis not present

## 2019-07-05 DIAGNOSIS — M545 Low back pain: Secondary | ICD-10-CM | POA: Diagnosis not present

## 2019-07-05 DIAGNOSIS — M419 Scoliosis, unspecified: Secondary | ICD-10-CM

## 2019-07-05 DIAGNOSIS — G8929 Other chronic pain: Secondary | ICD-10-CM | POA: Diagnosis not present

## 2019-07-05 MED ORDER — METHYLPREDNISOLONE ACETATE 80 MG/ML IJ SUSP
80.0000 mg | Freq: Once | INTRAMUSCULAR | Status: AC
  Administered 2019-07-05: 80 mg

## 2019-07-05 NOTE — Progress Notes (Addendum)
Right side scoliosis with tight muscle/trigger point - hang up, voltaren gel PT dry needling. ? Brace. Pt states pain across the lower back, pt states right side of the lower back is hurting him the worst. Pt states pain returned 8 months post RFA that was done 02/2018. Excessive bending and standing on feet makes pain worse. Medication helps with pain.   .Numeric Pain Rating Scale and Functional Assessment Average Pain 7   In the last MONTH (on 0-10 scale) has pain interfered with the following?  1. General activity like being  able to carry out your everyday physical activities such as walking, climbing stairs, carrying groceries, or moving a chair?  Rating(8)   +Driver, -BT, -Dye Allergies.

## 2019-07-05 NOTE — Progress Notes (Signed)
Dean Pratt - 84 y.o. male MRN [id number]  Date of birth: 05-May-1935  Office Visit Note: Visit Date: 07/05/2019 PCP: [name], MD Referred by: [name], MD  Subjective: Chief Complaint  Patient presents with  . Lower Back - Pain   HPI: Dean Pratt is a 84 y.o. male who comes in today for planned radiofrequency ablation of the Right L3-L4 L4-L5 Lumbar facet joints. This would be ablation of the corresponding medial branches and/or dorsal rami.  Patient has had double diagnostic blocks with more than 50% relief.  These are documented on pain diary.  They have had chronic back pain for quite some time, more than 3 months, which has been an ongoing situation with recalcitrant axial back pain.  This is a repeat radiofrequency ablation.  Patient had prior procedure in February 2020 with good relief up until the last several months.  His case is complicated by lumbar scoliosis.  He does have significant taut band on the right part of the curve with pain in the muscle area as well as pain with facet loading.  They have no radicular pain.  Their axial pain is worse with standing and ambulating and on exam today with facet loading.  They have had physical therapy as well as home exercise program.  The imaging noted in the chart below indicated facet pathology. Accordingly they meet all the criteria and qualification for for radiofrequency ablation and we are going to complete this today hopefully for more longer term relief as part of comprehensive management program.  I have suggested to him the use of Voltaren gel over the outer part of the curve where the muscle is.  I tried to tell him that there is probably no treatment for the scoliosis at this point.  Bracing might help if he can get one fitted to him but it would not help with the curve but it could give him some pain relief with activity.  I would also suggest regrouping with a physical therapist for dry needling which he is not had.   Otherwise we talked about the use of the hanging up type machines and he says the daughter actually has when he is going to try for stretching.  Review of Systems  Musculoskeletal: Positive for back pain.  All other systems reviewed and are negative.  Otherwise per HPI.  Assessment & Plan: Visit Diagnoses:  1. Spondylosis without myelopathy or radiculopathy, lumbar region   2. Chronic bilateral low back pain without sciatica   3. Scoliosis of thoracolumbar spine, unspecified scoliosis type     Plan: Findings:  1.  Planned repeat radiofrequency ablation on the right at L3-4 and L4-5.  Prior notes were looked at and it looks like we dictated L2-3 and L3-4 but in point of fact all the treatment has been done at L3-4 and L4-5.  I did review images showing the same.  MRI does show more arthritis at L3-4 and L4-5.  Nonetheless it did help him and this is the right procedure to do as a repeat.  He is really met all other criteria.  He has had no other relief otherwise.  2.  New complaint today is paraspinal pain which she felt like was a hard knot.  He does have paraspinal pain higher than the lower back which is really on the outside part of his curve in the spine.  He has very tight musculature.  Had a long discussion with him about scoliosis and stretching.  He can try one of the hang of machines that his daughter has.  We talked about the use of Voltaren gel in this area.  Talked about regrouping with a specific physical therapist looking for scoliosis versus manual treatment or dry needling.  We will follow up him from this if needed.    Meds & Orders:  Meds ordered this encounter  Medications  . methylPREDNISolone acetate (DEPO-MEDROL) injection 80 mg    Orders Placed This Encounter  Procedures  . Radiofrequency,Lumbar  . XR C-ARM NO REPORT    Follow-up: No follow-ups on file.   Procedures: No procedures performed  Lumbar Facet Joint Nerve Denervation  Patient: Dean Pratt      Date of Birth: 1935/05/03 MRN: [id number] PCP: [name], MD      Visit Date: 07/05/2019   Universal Protocol:    Date/Time: 07/06/2110:09 PM  Consent Given By: the patient  Position: PRONE  Additional Comments: Vital signs were monitored before and after the procedure. Patient was prepped and draped in the usual sterile fashion. The correct patient, procedure, and site was verified.   Injection Procedure Details:  Procedure Site One Meds Administered:  Meds ordered this encounter  Medications  . methylPREDNISolone acetate (DEPO-MEDROL) injection 80 mg     Laterality: Right  Location/Site:  L3-L4 L4-L5  Needle size: 18 G  Needle type: Radiofrequency cannula  Needle Placement: Along juncture of superior articular process and transverse pocess  Findings:  -Comments:  Procedure Details: For each desired target nerve, the corresponding transverse process (sacral ala for the L5 dorsal rami) was identified and the fluoroscope was positioned to square off the endplates of the corresponding vertebral body to achieve a true AP midline view.  The beam was then obliqued 15 to 20 degrees and caudally tilted 15 to 20 degrees to line up a trajectory along the target nerves. The skin over the target of the junction of superior articulating process and transverse process (sacral ala for the L5 dorsal rami) was infiltrated with 49m of 1% Lidocaine without Epinephrine.  The 18 gauge 180mactive tip outer cannula was advanced in trajectory view to the target.  This procedure was repeated for each target nerve.  Then, for all levels, the outer cannula placement was fine-tuned and the position was then confirmed with bi-planar imaging.    Test stimulation was done both at sensory and motor levels to ensure there was no radicular stimulation. The target tissues were then infiltrated with 1 ml of 1% Lidocaine without Epinephrine. Subsequently, a percutaneous neurotomy was carried  out for 90 seconds at 80 degrees Celsius.  After the completion of the lesion, 1 ml of injectate was delivered. It was then repeated for each facet joint nerve mentioned above. Appropriate radiographs were obtained to verify the probe placement during the neurotomy.   Additional Comments:  The patient tolerated the procedure well Dressing: 2 x 2 sterile gauze and Band-Aid    Post-procedure details: Patient was observed during the procedure. Post-procedure instructions were reviewed.  Patient left the clinic in stable condition.        Clinical History: MRI LUMBAR SPINE WITHOUT CONTRAST  TECHNIQUE: Multiplanar, multisequence MR imaging of the lumbar spine was performed. No intravenous contrast was administered.  COMPARISON:  Lumbar MRI 10/16/2010.  Abdominal CT 02/28/2008.  FINDINGS: Segmentation: Conventional anatomy assumed, with the last open disc space designated L5-S1.This numbering is concordant with previous imaging.  Alignment: There is a convex right scoliosis centered at L2. The lateral  alignment is stable and near anatomic.  Vertebrae: No worrisome osseous lesion, acute fracture or pars defect. There are scattered endplate degenerative changes. The lumbar pedicles are somewhat short on a congenital basis. The visualized sacroiliac joints appear unremarkable.  Conus medullaris: Extends to the T12-L1 level and appears normal.  Paraspinal and other soft tissues: Right renal cysts are partially imaged.  Disc levels:  T12-L1: Chronic disc extrusion with cephalad extension has largely involuted. No mass effect on the distal cord or foraminal compromise.  L1-2: Chronic degenerative disc disease with loss of disc height, annular disc bulging eccentric to the left and circumferential endplate osteophytes. Mild facet hypertrophy. No significant spinal stenosis or nerve root encroachment.  L2-3: Mildly progressive loss of disc height with annular  disc bulging and endplate osteophytes asymmetric to the left. Mild facet and ligamentous hypertrophy. There is mild asymmetric narrowing of the left lateral recess and left foramen which has mildly progressed.  L3-4: Stable mild loss of disc height with annular disc bulging and asymmetric facet hypertrophy on the right. Mild narrowing of the right lateral recess and both foramina.  L4-5: Mildly progressive loss of disc height annular disc bulging and endplate osteophytes asymmetric to the right. Right extraforaminal disc extrusion has involuted with improved right foraminal narrowing, now moderate. There is facet and ligamentous hypertrophy contributing to mild narrowing of the lateral recesses.  L5-S1: Stable chronic degenerative disc disease with loss of disc height and annular disc bulging eccentric the right. Mild facet hypertrophy. Stable mild right-greater-than-left foraminal narrowing.  IMPRESSION: 1. Compared with the prior study from 2012, no acute findings are seen. 2. There is minimally progressive multilevel spondylosis with disc bulging and endplate osteophytes. These contribute to mild lateral recess and foraminal narrowing as described above. No acute findings or definite nerve root compression. Right extraforaminal disc extrusion at L4-5 has involuted and there is less right foraminal narrowing.   Electronically Signed   By: Richardean Sale M.D.   On: 06/12/2017 14:41   He reports that he has quit smoking. He has never used smokeless tobacco. No results for input(s): HGBA1C, LABURIC in the last 8760 hours.  Objective:  VS:  HT:    WT:   BMI:     BP:127/73  HR:71bpm  TEMP: ( )  RESP:  Physical Exam Constitutional:      General: He is not in acute distress.    Appearance: Normal appearance. He is not ill-appearing.  HENT:     Head: Normocephalic and atraumatic.     Right Ear: External ear normal.     Left Ear: External ear normal.  Eyes:      Extraocular Movements: Extraocular movements intact.  Cardiovascular:     Rate and Rhythm: Normal rate.     Pulses: Normal pulses.  Abdominal:     General: There is no distension.     Palpations: Abdomen is soft.  Musculoskeletal:        General: No tenderness or signs of injury.     Right lower leg: No edema.     Left lower leg: No edema.     Comments: Patient has good distal strength without clonus.  Patient has right sided scoliosis concave to the left centered at about the L2 vertebral body.  He has pain along the paraspinal muscles in the very tight on the right.  He has increased kyphosis.  He has no pain with hip rotation.  He has pain with facet loading.  Skin:    Findings:  No erythema or rash.  Neurological:     General: No focal deficit present.     Mental Status: He is alert and oriented to person, place, and time.     Sensory: No sensory deficit.     Motor: No weakness or abnormal muscle tone.     Coordination: Coordination normal.  Psychiatric:        Mood and Affect: Mood normal.        Behavior: Behavior normal.     Ortho Exam  Imaging: XR C-ARM NO REPORT  Result Date: 07/05/2019 Please see Notes tab for imaging impression.   Past Medical/Family/Surgical/Social History: Medications & Allergies reviewed per EMR, new medications updated. Patient Active Problem List   Diagnosis Date Noted  . Status post ablation of atrial fibrillation 12/24/2017  . Polycythemia vera ([place]) 04/13/2017  . OTHER AND UNSPECIFIED COAGULATION DEFECTS 01/02/2010  . ATRIAL FIBRILLATION 01/02/2010  . SNORING 02/27/2009  . ISCHEMIC COLITIS 03/27/2008  . ABDOMINAL PAIN-RLQ 03/22/2008  . ABNORMAL FINDINGS GI TRACT 03/22/2008  . PERSONAL HX COLONIC POLYPS 03/22/2008  . COLONIC POLYPS 03/21/2008  . Dyslipidemia 03/21/2008  . GOUT 03/21/2008  . ANXIETY 03/21/2008  . MIGRAINE HEADACHE 03/21/2008  . CONSTIPATION, CHRONIC 03/21/2008  . ROSACEA 03/21/2008  . Essential hypertension  03/21/2008  . CARCINOMA, BASAL CELL, HX OF 03/21/2008  . TUBERCULOSIS, HX OF 03/21/2008  . ATRIAL FIBRILLATION, HX OF 03/21/2008  . BENIGN PROSTATIC HYPERTROPHY, HX OF 03/21/2008   Past Medical History:  Diagnosis Date  . Anxiety   . Atrial flutter ([place])    s/p afib and atrial flutter ablation 04/09/09  . Blood transfusion without reported diagnosis   . BPH (benign prostatic hyperplasia)   . Cancer ([place])    basal cell CA removed  . Cataract    removed with lens implants   . Colitis, ischemic ([place])    secondary to ebolism from afib 1/10  . Constipation   . GERD (gastroesophageal reflux disease)   . Gout   . HTN (hypertension)   . Hyperlipidemia   . Intracranial bleed ([place]) 04/10/10   Right occipital hematoma with a left homonymous hemianopsia   . Migraines   . Paroxysmal atrial fibrillation ([place])    s/p PVI 04/09/09 and 10/17/10  . Rosacea   . Stroke ([place]) 04/08/2010   hemorragic, anticoagulation stopped at that time  . Tuberculosis    s/p treatment 1964   Family History  Problem Relation Age of Onset  . Liver cancer Father   . Prostate cancer Father   . Coronary artery disease Father   . COPD Mother   . Breast cancer Sister   . Colon cancer Neg Hx   . Rectal cancer Neg Hx   . Stomach cancer Neg Hx   . Esophageal cancer Neg Hx   . Colon polyps Neg Hx    Past Surgical History:  Procedure Laterality Date  . ablation  04/09/2009   s/p afib and atrial flutter ablation by JA  . ABLATION OF DYSRHYTHMIC FOCUS  10/15/09   repeat afib ablation by JA  . APPENDECTOMY    . CATARACT EXTRACTION    . CATARACT EXTRACTION, BILATERAL     with lens implants   . COLONOSCOPY    . CORNEAL LACERATION REPAIR    . EYE SURGERY    . INGUINAL HERNIA REPAIR    . POLYPECTOMY    . TONSILLECTOMY    . VASECTOMY     Social History   Occupational History  .  Not on file  Tobacco Use  . Smoking status: Former Research scientist (life sciences)  . Smokeless tobacco: Never Used  Vaping Use  . Vaping Use: Never used   Substance and Sexual Activity  . Alcohol use: No  . Drug use: No  . Sexual activity: Not on file

## 2019-07-06 NOTE — Procedures (Signed)
Lumbar Facet Joint Nerve Denervation  Patient: Dean Pratt      Date of Birth: [birth year] MRN: [id number] PCP: [name], MD      Visit Date: 07/05/2019   Universal Protocol:    Date/Time: 07/06/2110:09 PM  Consent Given By: the patient  Position: PRONE  Additional Comments: Vital signs were monitored before and after the procedure. Patient was prepped and draped in the usual sterile fashion. The correct patient, procedure, and site was verified.   Injection Procedure Details:  Procedure Site One Meds Administered:  Meds ordered this encounter  Medications   methylPREDNISolone acetate (DEPO-MEDROL) injection 80 mg     Laterality: Right  Location/Site:  L3-L4 L4-L5  Needle size: 18 G  Needle type: Radiofrequency cannula  Needle Placement: Along juncture of superior articular process and transverse pocess  Findings:  -Comments:  Procedure Details: For each desired target nerve, the corresponding transverse process (sacral ala for the L5 dorsal rami) was identified and the fluoroscope was positioned to square off the endplates of the corresponding vertebral body to achieve a true AP midline view.  The beam was then obliqued 15 to 20 degrees and caudally tilted 15 to 20 degrees to line up a trajectory along the target nerves. The skin over the target of the junction of superior articulating process and transverse process (sacral ala for the L5 dorsal rami) was infiltrated with 27ml of 1% Lidocaine without Epinephrine.  The 18 gauge 83mm active tip outer cannula was advanced in trajectory view to the target.  This procedure was repeated for each target nerve.  Then, for all levels, the outer cannula placement was fine-tuned and the position was then confirmed with bi-planar imaging.    Test stimulation was done both at sensory and motor levels to ensure there was no radicular stimulation. The target tissues were then infiltrated with 1 ml of 1% Lidocaine without  Epinephrine. Subsequently, a percutaneous neurotomy was carried out for 90 seconds at 80 degrees Celsius.  After the completion of the lesion, 1 ml of injectate was delivered. It was then repeated for each facet joint nerve mentioned above. Appropriate radiographs were obtained to verify the probe placement during the neurotomy.   Additional Comments:  The patient tolerated the procedure well Dressing: 2 x 2 sterile gauze and Band-Aid    Post-procedure details: Patient was observed during the procedure. Post-procedure instructions were reviewed.  Patient left the clinic in stable condition.

## 2019-07-21 ENCOUNTER — Encounter: Payer: Self-pay | Admitting: Physician Assistant

## 2019-07-24 ENCOUNTER — Ambulatory Visit: Payer: Medicare HMO | Admitting: Physical Medicine and Rehabilitation

## 2019-07-24 ENCOUNTER — Ambulatory Visit: Payer: Self-pay

## 2019-07-24 ENCOUNTER — Other Ambulatory Visit: Payer: Self-pay

## 2019-07-24 ENCOUNTER — Encounter: Payer: Self-pay | Admitting: Physical Medicine and Rehabilitation

## 2019-07-24 VITALS — BP 122/74 | HR 64

## 2019-07-24 DIAGNOSIS — M545 Low back pain: Secondary | ICD-10-CM | POA: Diagnosis not present

## 2019-07-24 DIAGNOSIS — G8929 Other chronic pain: Secondary | ICD-10-CM | POA: Diagnosis not present

## 2019-07-24 DIAGNOSIS — M47816 Spondylosis without myelopathy or radiculopathy, lumbar region: Secondary | ICD-10-CM

## 2019-07-24 DIAGNOSIS — M419 Scoliosis, unspecified: Secondary | ICD-10-CM

## 2019-07-24 MED ORDER — BETAMETHASONE SOD PHOS & ACET 6 (3-3) MG/ML IJ SUSP
12.0000 mg | Freq: Once | INTRAMUSCULAR | Status: AC
  Administered 2019-07-24: 12 mg

## 2019-07-24 NOTE — Progress Notes (Signed)
Pt states left side lower back. Pt states blending over and picking things up. Pt states sitting up makes it feel better. Pt states last inj helped out very good on 07/05/19.  Numeric Pain Rating Scale and Functional Assessment Average Pain 6   In the last MONTH (on 0-10 scale) has pain interfered with the following?  1. General activity like being  able to carry out your everyday physical activities such as walking, climbing stairs, carrying groceries, or moving a chair?  Rating(8)   +Driver, -BT, -Dye Allergies.

## 2019-07-25 DIAGNOSIS — M47816 Spondylosis without myelopathy or radiculopathy, lumbar region: Secondary | ICD-10-CM | POA: Insufficient documentation

## 2019-07-25 DIAGNOSIS — M419 Scoliosis, unspecified: Secondary | ICD-10-CM | POA: Insufficient documentation

## 2019-07-25 NOTE — Progress Notes (Signed)
Dean Pratt - 84 y.o. male MRN [id number]  Date of birth: Aug 30, 1935  Office Visit Note: Visit Date: 07/24/2019 PCP: [name], MD Referred by: [name], MD  Subjective: Chief Complaint  Patient presents with  . Lower Back - Pain   HPI:  Dean Pratt is a 84 y.o. male who comes in today for planned radiofrequency ablation of the Left L3-L4 L4-L5 Lumbar facet joints. This would be ablation of the corresponding medial branches and/or dorsal rami.  Patient has had double diagnostic blocks with more than 50% relief.  These are documented on pain diary.  They have had chronic back pain for quite some time, more than 3 months, which has been an ongoing situation with recalcitrant axial back pain.  They have no radicular pain.  Their axial pain is worse with standing and ambulating and on exam today with facet loading.  They have had physical therapy as well as home exercise program.  The imaging noted in the chart below indicated facet pathology. Accordingly they meet all the criteria and qualification for for radiofrequency ablation and we are going to complete this today hopefully for more longer term relief as part of comprehensive management program.  Patient still complaining of some referral pain into the abdomen and he reports chronic constipation.  Had a long talk with him today about using daily MiraLAX along with twice a day Colace.  He had tried magnesium citrate at the request of his primary care physician Dr. Virgina Jock which did help to some degree.  He does have an appointment coming up with gastroenterology and actually has another appointment set up already for colonoscopy.  He does have interesting posture with significant scoliosis of the thoracolumbar spine and protruding abdomen.  Otherwise a very thin individual.  ROS Otherwise per HPI.  Assessment & Plan: Visit Diagnoses:  1. Spondylosis without myelopathy or radiculopathy, lumbar region   2. Chronic bilateral low back  pain without sciatica   3. Scoliosis of thoracolumbar spine, unspecified scoliosis type     Plan: No additional findings.   Meds & Orders:  Meds ordered this encounter  Medications  . betamethasone acetate-betamethasone sodium phosphate (CELESTONE) injection 12 mg    Orders Placed This Encounter  Procedures  . Radiofrequency,Lumbar  . XR C-ARM NO REPORT    Follow-up: Return if symptoms worsen or fail to improve.   Procedures: No procedures performed  Lumbar Facet Joint Nerve Denervation  Patient: Dean Pratt      Date of Birth: May 13, 1935 MRN: [id number] PCP: [name], MD      Visit Date: 07/24/2019   Universal Protocol:    Date/Time: 07/20/216:36 AM  Consent Given By: the patient  Position: PRONE  Additional Comments: Vital signs were monitored before and after the procedure. Patient was prepped and draped in the usual sterile fashion. The correct patient, procedure, and site was verified.   Injection Procedure Details:  Procedure Site One Meds Administered:  Meds ordered this encounter  Medications  . betamethasone acetate-betamethasone sodium phosphate (CELESTONE) injection 12 mg     Laterality: Left  Location/Site:  L3-L4 L4-L5  Needle size: 18 G  Needle type: Radiofrequency cannula  Needle Placement: Along juncture of superior articular process and transverse pocess  Findings:  -Comments:  Procedure Details: For each desired target nerve, the corresponding transverse process (sacral ala for the L5 dorsal rami) was identified and the fluoroscope was positioned to square off the endplates of the corresponding vertebral body to achieve a  true AP midline view.  The beam was then obliqued 15 to 20 degrees and caudally tilted 15 to 20 degrees to line up a trajectory along the target nerves. The skin over the target of the junction of superior articulating process and transverse process (sacral ala for the L5 dorsal rami) was infiltrated with 56ml of 1%  Lidocaine without Epinephrine.  The 18 gauge 58mm active tip outer cannula was advanced in trajectory view to the target.  This procedure was repeated for each target nerve.  Then, for all levels, the outer cannula placement was fine-tuned and the position was then confirmed with bi-planar imaging.    Test stimulation was done both at sensory and motor levels to ensure there was no radicular stimulation. The target tissues were then infiltrated with 1 ml of 1% Lidocaine without Epinephrine. Subsequently, a percutaneous neurotomy was carried out for 90 seconds at 80 degrees Celsius.  After the completion of the lesion, 1 ml of injectate was delivered. It was then repeated for each facet joint nerve mentioned above. Appropriate radiographs were obtained to verify the probe placement during the neurotomy.   Additional Comments:  The patient tolerated the procedure well Dressing: 2 x 2 sterile gauze and Band-Aid    Post-procedure details: Patient was observed during the procedure. Post-procedure instructions were reviewed.  Patient left the clinic in stable condition.       Clinical History: MRI LUMBAR SPINE WITHOUT CONTRAST  TECHNIQUE: Multiplanar, multisequence MR imaging of the lumbar spine was performed. No intravenous contrast was administered.  COMPARISON:  Lumbar MRI 10/16/2010.  Abdominal CT 02/28/2008.  FINDINGS: Segmentation: Conventional anatomy assumed, with the last open disc space designated L5-S1.This numbering is concordant with previous imaging.  Alignment: There is a convex right scoliosis centered at L2. The lateral alignment is stable and near anatomic.  Vertebrae: No worrisome osseous lesion, acute fracture or pars defect. There are scattered endplate degenerative changes. The lumbar pedicles are somewhat short on a congenital basis. The visualized sacroiliac joints appear unremarkable.  Conus medullaris: Extends to the T12-L1 level and appears  normal.  Paraspinal and other soft tissues: Right renal cysts are partially imaged.  Disc levels:  T12-L1: Chronic disc extrusion with cephalad extension has largely involuted. No mass effect on the distal cord or foraminal compromise.  L1-2: Chronic degenerative disc disease with loss of disc height, annular disc bulging eccentric to the left and circumferential endplate osteophytes. Mild facet hypertrophy. No significant spinal stenosis or nerve root encroachment.  L2-3: Mildly progressive loss of disc height with annular disc bulging and endplate osteophytes asymmetric to the left. Mild facet and ligamentous hypertrophy. There is mild asymmetric narrowing of the left lateral recess and left foramen which has mildly progressed.  L3-4: Stable mild loss of disc height with annular disc bulging and asymmetric facet hypertrophy on the right. Mild narrowing of the right lateral recess and both foramina.  L4-5: Mildly progressive loss of disc height annular disc bulging and endplate osteophytes asymmetric to the right. Right extraforaminal disc extrusion has involuted with improved right foraminal narrowing, now moderate. There is facet and ligamentous hypertrophy contributing to mild narrowing of the lateral recesses.  L5-S1: Stable chronic degenerative disc disease with loss of disc height and annular disc bulging eccentric the right. Mild facet hypertrophy. Stable mild right-greater-than-left foraminal narrowing.  IMPRESSION: 1. Compared with the prior study from 2012, no acute findings are seen. 2. There is minimally progressive multilevel spondylosis with disc bulging and endplate osteophytes. These contribute to  mild lateral recess and foraminal narrowing as described above. No acute findings or definite nerve root compression. Right extraforaminal disc extrusion at L4-5 has involuted and there is less right foraminal narrowing.   Electronically Signed    By: Richardean Sale M.D.   On: 06/12/2017 14:41     Objective:  VS:  HT:    WT:   BMI:     BP:122/74  HR:64bpm  TEMP: ( )  RESP:  Physical Exam Constitutional:      General: He is not in acute distress.    Appearance: Normal appearance. He is not ill-appearing.  HENT:     Head: Normocephalic and atraumatic.     Right Ear: External ear normal.     Left Ear: External ear normal.  Eyes:     Extraocular Movements: Extraocular movements intact.  Cardiovascular:     Rate and Rhythm: Normal rate.     Pulses: Normal pulses.  Abdominal:     General: There is distension.     Palpations: Abdomen is soft.     Tenderness: There is no abdominal tenderness. There is no guarding or rebound.  Musculoskeletal:        General: No tenderness or signs of injury.     Right lower leg: No edema.     Left lower leg: No edema.     Comments: Patient has good distal strength without clonus. Patient somewhat slow to rise from a seated position to full extension.  There is concordant low back pain with facet loading and lumbar spine extension rotation.  There are no definitive trigger points but the patient is somewhat tender across the lower back and PSIS.  There is no pain with hip rotation.  Skin:    Findings: No erythema or rash.  Neurological:     General: No focal deficit present.     Mental Status: He is alert and oriented to person, place, and time.     Sensory: No sensory deficit.     Motor: No weakness or abnormal muscle tone.     Coordination: Coordination normal.  Psychiatric:        Mood and Affect: Mood normal.        Behavior: Behavior normal.      Imaging: XR C-ARM NO REPORT  Result Date: 07/24/2019 Please see Notes tab for imaging impression.

## 2019-07-25 NOTE — Procedures (Signed)
Lumbar Facet Joint Nerve Denervation  Patient: Dean Pratt      Date of Birth: 29-Jun-1935 MRN: [id number] PCP: [name], MD      Visit Date: 07/24/2019   Universal Protocol:    Date/Time: 07/20/216:36 AM  Consent Given By: the patient  Position: PRONE  Additional Comments: Vital signs were monitored before and after the procedure. Patient was prepped and draped in the usual sterile fashion. The correct patient, procedure, and site was verified.   Injection Procedure Details:  Procedure Site One Meds Administered:  Meds ordered this encounter  Medications  . betamethasone acetate-betamethasone sodium phosphate (CELESTONE) injection 12 mg     Laterality: Left  Location/Site:  L3-L4 L4-L5  Needle size: 18 G  Needle type: Radiofrequency cannula  Needle Placement: Along juncture of superior articular process and transverse pocess  Findings:  -Comments:  Procedure Details: For each desired target nerve, the corresponding transverse process (sacral ala for the L5 dorsal rami) was identified and the fluoroscope was positioned to square off the endplates of the corresponding vertebral body to achieve a true AP midline view.  The beam was then obliqued 15 to 20 degrees and caudally tilted 15 to 20 degrees to line up a trajectory along the target nerves. The skin over the target of the junction of superior articulating process and transverse process (sacral ala for the L5 dorsal rami) was infiltrated with 43ml of 1% Lidocaine without Epinephrine.  The 18 gauge 28mm active tip outer cannula was advanced in trajectory view to the target.  This procedure was repeated for each target nerve.  Then, for all levels, the outer cannula placement was fine-tuned and the position was then confirmed with bi-planar imaging.    Test stimulation was done both at sensory and motor levels to ensure there was no radicular stimulation. The target tissues were then infiltrated with 1 ml of 1%  Lidocaine without Epinephrine. Subsequently, a percutaneous neurotomy was carried out for 90 seconds at 80 degrees Celsius.  After the completion of the lesion, 1 ml of injectate was delivered. It was then repeated for each facet joint nerve mentioned above. Appropriate radiographs were obtained to verify the probe placement during the neurotomy.   Additional Comments:  The patient tolerated the procedure well Dressing: 2 x 2 sterile gauze and Band-Aid    Post-procedure details: Patient was observed during the procedure. Post-procedure instructions were reviewed.  Patient left the clinic in stable condition.

## 2019-08-01 ENCOUNTER — Inpatient Hospital Stay: Payer: Medicare HMO

## 2019-08-01 ENCOUNTER — Inpatient Hospital Stay: Payer: Medicare HMO | Attending: Internal Medicine | Admitting: Internal Medicine

## 2019-08-01 ENCOUNTER — Other Ambulatory Visit: Payer: Self-pay | Admitting: *Deleted

## 2019-08-01 ENCOUNTER — Other Ambulatory Visit: Payer: Self-pay

## 2019-08-01 ENCOUNTER — Telehealth: Payer: Self-pay | Admitting: Internal Medicine

## 2019-08-01 ENCOUNTER — Encounter: Payer: Self-pay | Admitting: Internal Medicine

## 2019-08-01 VITALS — BP 114/90 | HR 74 | Temp 98.7°F | Resp 18 | Ht 70.0 in | Wt 158.3 lb

## 2019-08-01 DIAGNOSIS — D45 Polycythemia vera: Secondary | ICD-10-CM

## 2019-08-01 DIAGNOSIS — I10 Essential (primary) hypertension: Secondary | ICD-10-CM

## 2019-08-01 LAB — CBC WITH DIFFERENTIAL (CANCER CENTER ONLY)
Abs Immature Granulocytes: 0.06 10*3/uL (ref 0.00–0.07)
Basophils Absolute: 0.1 10*3/uL (ref 0.0–0.1)
Basophils Relative: 1 %
Eosinophils Absolute: 0.1 10*3/uL (ref 0.0–0.5)
Eosinophils Relative: 1 %
HCT: 49.6 % (ref 39.0–52.0)
Hemoglobin: 14.4 g/dL (ref 13.0–17.0)
Immature Granulocytes: 1 %
Lymphocytes Relative: 9 %
Lymphs Abs: 1.1 10*3/uL (ref 0.7–4.0)
MCH: 19.5 pg — ABNORMAL LOW (ref 26.0–34.0)
MCHC: 29 g/dL — ABNORMAL LOW (ref 30.0–36.0)
MCV: 67 fL — ABNORMAL LOW (ref 80.0–100.0)
Monocytes Absolute: 0.8 10*3/uL (ref 0.1–1.0)
Monocytes Relative: 7 %
Neutro Abs: 9.7 10*3/uL — ABNORMAL HIGH (ref 1.7–7.7)
Neutrophils Relative %: 81 %
Platelet Count: 617 10*3/uL — ABNORMAL HIGH (ref 150–400)
RBC: 7.4 MIL/uL — ABNORMAL HIGH (ref 4.22–5.81)
RDW: 20.2 % — ABNORMAL HIGH (ref 11.5–15.5)
WBC Count: 11.7 10*3/uL — ABNORMAL HIGH (ref 4.0–10.5)
nRBC: 0 % (ref 0.0–0.2)

## 2019-08-01 NOTE — Patient Instructions (Signed)

## 2019-08-01 NOTE — Progress Notes (Signed)
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    PRIOR THERAPY: None  CURRENT THERAPY: Phlebotomy on as-needed basis.  INTERVAL HISTORY: Dean Pratt 84 y.o. male returns to the clinic today for follow-up visit.  The patient is feeling fine today with no concerning complaints except for intermittent abdominal pain and constipation.  He is using over-the-counter laxative.  He denied having any current chest pain, shortness of breath, cough or hemoptysis.  He denied having any fever or chills.  He has no nausea, vomiting, diarrhea or constipation.  He has no headache or visual changes.  He has no bleeding issues.  He is here today for evaluation with repeat CBC.   MEDICAL HISTORY: Past Medical History:  Diagnosis Date  . Anxiety   . Atrial flutter ([facility])    s/p afib and atrial flutter ablation 04/09/09  . Blood transfusion without reported diagnosis   . BPH (benign prostatic hyperplasia)   . Cancer ([facility])    basal cell CA removed  . Cataract    removed with lens implants   . Colitis, ischemic ([facility])    secondary to ebolism from afib 1/10  . Constipation   . GERD (gastroesophageal reflux disease)   . Gout   . HTN (hypertension)   . Hyperlipidemia   . Intracranial bleed ([facility]) 04/10/10   Right occipital hematoma with a left homonymous hemianopsia   . Migraines   . Paroxysmal atrial fibrillation ([facility])    s/p PVI 04/09/09 and 10/17/10  . Rosacea   . Stroke ([facility]) 04/08/2010   hemorragic, anticoagulation stopped at that time  . Tuberculosis    s/p treatment 1964    ALLERGIES:  is allergic to ace inhibitors and warfarin and related.  MEDICATIONS:  Current Outpatient Medications  Medication Sig Dispense Refill  . acetaminophen (TYLENOL) 325 MG tablet Take 650 mg by mouth every 6 (six) hours as needed.      Marland Kitchen  allopurinol (ZYLOPRIM) 100 MG tablet Take 100 mg by mouth at bedtime.      . Ascorbic Acid (VITAMIN C) 1000 MG tablet Take 1,000 mg by mouth daily.     Marland Kitchen aspirin 81 MG tablet Take 81 mg by mouth daily.      . clobetasol (TEMOVATE) 0.05 % external solution APPLY TO AFECTED AREAS (SCALP) TWICE A DAY AS NEEDED NOT FOR FACE,GROIN,UNDER ARMS    . diclofenac sodium (VOLTAREN) 1 % GEL APPLY 2-4 GRAMS 2-3 TIMES DAILY AS NEEDED FOR PAIN 100 g 0  . diltiazem (CARDIZEM CD) 240 MG 24 hr capsule TAKE 1 CAPSULE DAILY. PLEASE CALL OFFICE TO SCHEDULE APPOINTMENT FOR FURTHER REFILLS 15 capsule 0  . fluticasone (CUTIVATE) 0.05 % cream Apply 1 application topically daily.    Marland Kitchen ibuprofen (ADVIL,MOTRIN) 200 MG tablet Take 200 mg by mouth every 6 (six) hours as needed.    Marland Kitchen LORazepam (ATIVAN) 1 MG tablet TAKE 1/2-1 TABLET TWICE DAILY AS NEEDED  3  . losartan (COZAAR) 25 MG tablet TAKE 1 TABLET BY MOUTH EVERY DAY 90 tablet 2  . methocarbamol (ROBAXIN) 500 MG tablet Take 1 tablet (500 mg total) by mouth every 8 (eight) hours as needed for muscle spasms. 60 tablet 2  . NON FORMULARY Stool softener 100 mg... Take 1 capsule by mouth once daily.     . Olopatadine HCl 0.2 %  SOLN INSTILL 1 DROP INTO BOTH EYES TWICE A DAY    . rosuvastatin (CRESTOR) 10 MG tablet Take 10 mg by mouth daily.  2   Current Facility-Administered Medications  Medication Dose Route Frequency Provider Last Rate Last Admin  . 0.9 %  sodium chloride infusion  500 mL Intravenous Once Irene Shipper, MD      . bupivacaine (MARCAINE) 0.5 % (with pres) injection 3 mL  3 mL Other Once Magnus Sinning, MD        SURGICAL HISTORY:  Past Surgical History:  Procedure Laterality Date  . ablation  04/09/2009   s/p afib and atrial flutter ablation by [facility]  . ABLATION OF DYSRHYTHMIC FOCUS  10/15/09   repeat afib ablation by [facility]  . APPENDECTOMY    . CATARACT EXTRACTION    . CATARACT EXTRACTION, BILATERAL     with lens implants   . COLONOSCOPY    . CORNEAL  LACERATION REPAIR    . EYE SURGERY    . INGUINAL HERNIA REPAIR    . POLYPECTOMY    . TONSILLECTOMY    . VASECTOMY      REVIEW OF SYSTEMS:  A comprehensive review of systems was negative except for: Gastrointestinal: positive for abdominal pain and constipation   PHYSICAL EXAMINATION: General appearance: alert, cooperative and no distress Head: Normocephalic, without obvious abnormality, atraumatic Neck: no adenopathy, no JVD, supple, symmetrical, trachea midline and thyroid not enlarged, symmetric, no tenderness/mass/nodules Lymph nodes: Cervical, supraclavicular, and axillary nodes normal. Resp: clear to auscultation bilaterally Back: symmetric, no curvature. ROM normal. No CVA tenderness. Cardio: regular rate and rhythm, S1, S2 normal, no murmur, click, rub or gallop GI: soft, non-tender; bowel sounds normal; no masses,  no organomegaly Extremities: extremities normal, atraumatic, no cyanosis or edema  ECOG PERFORMANCE STATUS: 1 - Symptomatic but completely ambulatory  Blood pressure (!) 114/90, pulse 74, temperature 98.7 F (37.1 C), temperature source Temporal, resp. rate 18, height 5\' 10"  (1.778 m), weight 158 lb 4.8 oz (71.8 kg), SpO2 100 %.  LABORATORY DATA: Lab Results  Component Value Date   WBC 11.7 (H) 08/01/2019   HGB 14.4 08/01/2019   HCT 49.6 08/01/2019   MCV 67.0 (L) 08/01/2019   PLT 617 (H) 08/01/2019      Chemistry      Component Value Date/Time   NA 141 01/03/2019 0828   K 4.1 01/03/2019 0828   CL 106 01/03/2019 0828   CO2 24 01/03/2019 0828   BUN 13 01/03/2019 0828   CREATININE 1.12 01/03/2019 0828      Component Value Date/Time   CALCIUM 9.5 01/03/2019 0828   ALKPHOS 70 01/03/2019 0828   AST 16 01/03/2019 0828   ALT 15 01/03/2019 0828   BILITOT 0.7 01/03/2019 0828       RADIOGRAPHIC STUDIES: XR C-ARM NO REPORT  Result Date: 07/24/2019 Please see Notes tab for imaging impression.  XR C-ARM NO REPORT  Result Date: 07/05/2019 Please see  Notes tab for imaging impression.   ASSESSMENT AND PLAN: This is a very pleasant 84 years old white male with recently diagnosed polycythemia vera with positive Jak 2 mutation.  The patient is also currently on hormonal therapy for androgen deficiency. The patient is feeling fine today with no concerning complaints except for the constipation and he is using over-the-counter stool softener. CBC today showed hematocrit of 49.6%. I recommended for the patient to consider phlebotomy today. We will see him back for follow-up visit in 6 weeks for evaluation with  repeat blood work and phlebotomy if needed. He was advised to call immediately if he has any concerning symptoms in the interval. The patient voices understanding of current disease status and treatment options and is in agreement with the current care plan. All questions were answered. The patient knows to call the clinic with any problems, questions or concerns. We can certainly see the patient much sooner if necessary.  Disclaimer: This note was dictated with voice recognition software. Similar sounding words can inadvertently be transcribed and may not be corrected upon review.

## 2019-08-01 NOTE — Telephone Encounter (Signed)
Scheduled per 07/27 los, patient received updated calender.

## 2019-08-02 ENCOUNTER — Ambulatory Visit: Payer: Medicare HMO | Admitting: Physician Assistant

## 2019-08-02 ENCOUNTER — Encounter: Payer: Self-pay | Admitting: Physician Assistant

## 2019-08-02 VITALS — BP 122/70 | HR 68 | Ht 70.0 in | Wt 156.0 lb

## 2019-08-02 DIAGNOSIS — R194 Change in bowel habit: Secondary | ICD-10-CM

## 2019-08-02 DIAGNOSIS — Z86010 Personal history of colonic polyps: Secondary | ICD-10-CM | POA: Diagnosis not present

## 2019-08-02 MED ORDER — SUTAB 1479-225-188 MG PO TABS: 1.0000 | ORAL_TABLET | Freq: Once | ORAL | 0 refills | Status: AC

## 2019-08-02 NOTE — Progress Notes (Signed)
Assessment and plans noted ?

## 2019-08-02 NOTE — Patient Instructions (Addendum)
If you are age 82 or older, your body mass index should be between 23-30. Your Body mass index is 22.38 kg/m. If this is out of the aforementioned range listed, please consider follow up with your Primary Care Provider.  If you are age 55 or younger, your body mass index should be between 19-25. Your Body mass index is 22.38 kg/m. If this is out of the aformentioned range listed, please consider follow up with your Primary Care Provider.   Increase Miralax to twice daily.   You have been scheduled for a colonoscopy. Please follow written instructions given to you at your visit today.  Please pick up your prep supplies at the pharmacy within the next 1-3 days. If you use inhalers (even only as needed), please bring them with you on the day of your procedure.  Due to recent changes in healthcare laws, you may see the results of your imaging and laboratory studies on MyChart before your provider has had a chance to review them.  We understand that in some cases there may be results that are confusing or concerning to you. Not all laboratory results come back in the same time frame and the provider may be waiting for multiple results in order to interpret others.  Please give [place] 48 hours in order for your provider to thoroughly review all the results before contacting the office for clarification of your results.

## 2019-08-02 NOTE — Progress Notes (Signed)
Chief Complaint: Constipation  HPI:    Dean Pratt is an 84 year old male with a past medical history as listed below including polycythemia vera, atrial flutter, A. fib and stroke (12/30/2017 echo with LVEF 60-65%), known to Dr. Henrene Pastor, who was referred to me by [name], MD for a complaint of constipation.      10/26/2017 colonoscopy for high risk colon cancer surveillance due to patient's personal history of adenomas.  Multiple 1 to 7 mm polyps in the sigmoid colon, descending colon, transverse and ascending colon.  Internal hemorrhoids.  Repeat recommended in 2 years for surveillance.    Today, the patient presents to clinic and explains that over the past couple of months he has noticed a change in his bowel habits towards constipation.  He started using stool softeners and other laxatives to see if this would help but tells me that now he is using MiraLAX once daily as well as 2 Colace a day and is having soft solid stools but it is often 4 or more and feels like he can never get clean after wiping and never feels empty.  He did try using some mag citrate under direction of his PCP which helped but still did not make him feel completely empty.    Patient also describes some various other symptoms including joint aches and pains, flushing of his face, night sweats and increase in urination.    Denies fever, chills or blood in his stool.  Past Medical History:  Diagnosis Date  . Anxiety   . Atrial flutter ([place])    s/p afib and atrial flutter ablation 04/09/09  . Blood transfusion without reported diagnosis   . BPH (benign prostatic hyperplasia)   . Cancer ([place])    basal cell CA removed  . Cataract    removed with lens implants   . Colitis, ischemic ([place])    secondary to ebolism from afib 1/10  . Constipation   . GERD (gastroesophageal reflux disease)   . Gout   . HTN (hypertension)   . Hyperlipidemia   . Intracranial bleed ([place]) 04/10/10   Right occipital hematoma with a left  homonymous hemianopsia   . Migraines   . Paroxysmal atrial fibrillation ([place])    s/p PVI 04/09/09 and 10/17/10  . Rosacea   . Stroke ([place]) 04/08/2010   hemorragic, anticoagulation stopped at that time  . Tuberculosis    s/p treatment 1964    Past Surgical History:  Procedure Laterality Date  . ablation  04/09/2009   s/p afib and atrial flutter ablation by [facility]  . ABLATION OF DYSRHYTHMIC FOCUS  10/15/09   repeat afib ablation by [facility]  . APPENDECTOMY    . CATARACT EXTRACTION    . CATARACT EXTRACTION, BILATERAL     with lens implants   . COLONOSCOPY    . CORNEAL LACERATION REPAIR    . EYE SURGERY    . INGUINAL HERNIA REPAIR    . POLYPECTOMY    . TONSILLECTOMY    . VASECTOMY      Current Outpatient Medications  Medication Sig Dispense Refill  . acetaminophen (TYLENOL) 325 MG tablet Take 650 mg by mouth every 6 (six) hours as needed.      Marland Kitchen allopurinol (ZYLOPRIM) 100 MG tablet Take 100 mg by mouth at bedtime.      . Ascorbic Acid (VITAMIN C) 1000 MG tablet Take 1,000 mg by mouth daily.     Marland Kitchen aspirin 81 MG tablet Take 81 mg by mouth daily.      Marland Kitchen  clobetasol (TEMOVATE) 0.05 % external solution APPLY TO AFECTED AREAS (SCALP) TWICE A DAY AS NEEDED NOT FOR FACE,GROIN,UNDER ARMS    . diclofenac sodium (VOLTAREN) 1 % GEL APPLY 2-4 GRAMS 2-3 TIMES DAILY AS NEEDED FOR PAIN 100 g 0  . diltiazem (CARDIZEM CD) 240 MG 24 hr capsule TAKE 1 CAPSULE DAILY. PLEASE CALL OFFICE TO SCHEDULE APPOINTMENT FOR FURTHER REFILLS 15 capsule 0  . fluticasone (CUTIVATE) 0.05 % cream Apply 1 application topically daily.    Marland Kitchen ibuprofen (ADVIL,MOTRIN) 200 MG tablet Take 200 mg by mouth every 6 (six) hours as needed.    Marland Kitchen LORazepam (ATIVAN) 1 MG tablet TAKE 1/2-1 TABLET TWICE DAILY AS NEEDED  3  . losartan (COZAAR) 25 MG tablet TAKE 1 TABLET BY MOUTH EVERY DAY 90 tablet 2  . methocarbamol (ROBAXIN) 500 MG tablet Take 1 tablet (500 mg total) by mouth every 8 (eight) hours as needed for muscle spasms. 60 tablet 2  . NON  FORMULARY Stool softener 100 mg... Take 1 capsule by mouth once daily.     . Olopatadine HCl 0.2 % SOLN INSTILL 1 DROP INTO BOTH EYES TWICE A DAY    . rosuvastatin (CRESTOR) 10 MG tablet Take 10 mg by mouth daily.  2   Current Facility-Administered Medications  Medication Dose Route Frequency Provider Last Rate Last Admin  . 0.9 %  sodium chloride infusion  500 mL Intravenous Once Irene Shipper, MD      . bupivacaine (MARCAINE) 0.5 % (with pres) injection 3 mL  3 mL Other Once Magnus Sinning, MD        Allergies as of 08/02/2019 - Review Complete 08/01/2019  Allergen Reaction Noted  . Ace inhibitors Cough 08/03/2011  . Warfarin and related  08/03/2011    Family History  Problem Relation Age of Onset  . Liver cancer Father   . Prostate cancer Father   . Coronary artery disease Father   . COPD Mother   . Breast cancer Sister   . Colon cancer Neg Hx   . Rectal cancer Neg Hx   . Stomach cancer Neg Hx   . Esophageal cancer Neg Hx   . Colon polyps Neg Hx     Social History   Socioeconomic History  . Marital status: Married    Spouse name: Not on file  . Number of children: Not on file  . Years of education: Not on file  . Highest education level: Not on file  Occupational History  . Not on file  Tobacco Use  . Smoking status: Former Research scientist (life sciences)  . Smokeless tobacco: Never Used  Vaping Use  . Vaping Use: Never used  Substance and Sexual Activity  . Alcohol use: No  . Drug use: No  . Sexual activity: Not on file  Other Topics Concern  . Not on file  Social History Narrative   Pt lives in [place].    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  He is married and lives at home with his wife.  He used to smoke but quit  smoking many years ago.  Does get regular exercise.  No alcohol use.    Social Determinants of Health   Financial Resource Strain:   . Difficulty of Paying Living Expenses:   Food Insecurity:   . Worried  About Charity fundraiser in the Last Year:   . [place] in the Last Year:  Transportation Needs:   . Film/video editor (Medical):   Marland Kitchen Lack of Transportation (Non-Medical):   Physical Activity:   . Days of Exercise per Week:   . Minutes of Exercise per Session:   Stress:   . Feeling of Stress :   Social Connections:   . Frequency of Communication with Friends and Family:   . Frequency of Social Gatherings with Friends and Family:   . Attends Religious Services:   . Active Member of Clubs or Organizations:   . Attends Archivist Meetings:   Marland Kitchen Marital Status:   Intimate Partner Violence:   . Fear of Current or Ex-Partner:   . Emotionally Abused:   Marland Kitchen Physically Abused:   . Sexually Abused:     Review of Systems:    Constitutional: No weight loss, fever or chills Skin: No rash  Cardiovascular: No chest pain  Respiratory: No SOB  Gastrointestinal: See HPI and otherwise negative   Physical Exam:  Vital signs: BP 122/70   Pulse 68   Ht '5\' 10"'  (1.778 m)   Wt 156 lb (70.8 kg)   BMI 22.38 kg/m   Constitutional:   Pleasant Elderly Caucasian male appears to be in NAD, Well developed, Well nourished, alert and cooperative Respiratory: Respirations even and unlabored. Lungs clear to auscultation bilaterally.   No wheezes, crackles, or rhonchi.  Cardiovascular: Normal S1, S2. No MRG. Regular rate and rhythm. No peripheral edema, cyanosis or pallor.  Gastrointestinal:  Soft, nondistended, mild LLQ and suprapubic pain. No rebound or guarding. Normal bowel sounds. No appreciable masses or hepatomegaly. Rectal:  Not performed.  Psychiatric: Demonstrates good judgement and reason without abnormal affect or behaviors.  RELEVANT LABS AND IMAGING: CBC    Component Value Date/Time   WBC 11.7 (H) 08/01/2019 1112   WBC 8.7 04/10/2010 0605   RBC 7.40 (H) 08/01/2019 1112   HGB 14.4 08/01/2019 1112   HCT 49.6 08/01/2019 1112   PLT 617 (H) 08/01/2019 1112   MCV 67.0  (L) 08/01/2019 1112   MCH 19.5 (L) 08/01/2019 1112   MCHC 29.0 (L) 08/01/2019 1112   RDW 20.2 (H) 08/01/2019 1112   LYMPHSABS 1.1 08/01/2019 1112   MONOABS 0.8 08/01/2019 1112   EOSABS 0.1 08/01/2019 1112   BASOSABS 0.1 08/01/2019 1112    CMP     Component Value Date/Time   NA 141 01/03/2019 0828   K 4.1 01/03/2019 0828   CL 106 01/03/2019 0828   CO2 24 01/03/2019 0828   GLUCOSE 100 (H) 01/03/2019 0828   BUN 13 01/03/2019 0828   CREATININE 1.12 01/03/2019 0828   CALCIUM 9.5 01/03/2019 0828   PROT 7.1 01/03/2019 0828   ALBUMIN 4.3 01/03/2019 0828   AST 16 01/03/2019 0828   ALT 15 01/03/2019 0828   ALKPHOS 70 01/03/2019 0828   BILITOT 0.7 01/03/2019 0828   GFRNONAA >60 01/03/2019 0828   GFRAA >60 01/03/2019 0828    Assessment: 1.  Change in bowel habits: Towards constipation over the past couple of months; unknown etiology 2.  History of adenomatous polyps: Last colonoscopy in 2019 with recommendations for repeat in 2 years  Plan: 1.  Patient is due for a surveillance colonoscopy in October of this year.  We will go ahead and schedule this now.  This was scheduled with Dr. Henrene Pastor in the [facility].  Did discuss risks, benefits, limitations and alternatives and the patient agrees to proceed.  He will do a 2-day bowel prep given his recent complaint of constipation.  2.  Told patient to increase his daily MiraLAX to twice daily and continue his stool softener.  Discussed titration of this up to 4 times a day if needed. 3.  Briefly discussed patient's myriad of other complaints, likely this is related to his polycythemia vera and less likely his GI system.  Would recommend that he follow with his oncologist/hematologist in regards to this. 4.  Patient to follow in clinic per recommendations from Dr. Henrene Pastor after time of procedure.  Ellouise Newer, PA-C [facility] 08/02/2019, 3:15 PM  Cc: [name], MD

## 2019-08-03 NOTE — Progress Notes (Signed)
Assessment and plan reviewed 

## 2019-08-04 ENCOUNTER — Encounter: Payer: Self-pay | Admitting: Internal Medicine

## 2019-08-10 ENCOUNTER — Ambulatory Visit (AMBULATORY_SURGERY_CENTER): Payer: Medicare HMO | Admitting: Internal Medicine

## 2019-08-10 ENCOUNTER — Other Ambulatory Visit: Payer: Self-pay

## 2019-08-10 ENCOUNTER — Encounter: Payer: Self-pay | Admitting: Internal Medicine

## 2019-08-10 VITALS — BP 114/61 | HR 83 | Temp 97.3°F | Resp 24 | Ht 70.0 in | Wt 156.0 lb

## 2019-08-10 DIAGNOSIS — D124 Benign neoplasm of descending colon: Secondary | ICD-10-CM | POA: Diagnosis not present

## 2019-08-10 DIAGNOSIS — Z860100 Personal history of colon polyps, unspecified: Secondary | ICD-10-CM

## 2019-08-10 DIAGNOSIS — D122 Benign neoplasm of ascending colon: Secondary | ICD-10-CM | POA: Diagnosis not present

## 2019-08-10 DIAGNOSIS — I10 Essential (primary) hypertension: Secondary | ICD-10-CM | POA: Diagnosis not present

## 2019-08-10 DIAGNOSIS — I4891 Unspecified atrial fibrillation: Secondary | ICD-10-CM | POA: Diagnosis not present

## 2019-08-10 DIAGNOSIS — I4892 Unspecified atrial flutter: Secondary | ICD-10-CM | POA: Diagnosis not present

## 2019-08-10 DIAGNOSIS — D123 Benign neoplasm of transverse colon: Secondary | ICD-10-CM

## 2019-08-10 MED ORDER — SODIUM CHLORIDE 0.9 % IV SOLN: 500.0000 mL | Freq: Once | INTRAVENOUS | Status: DC

## 2019-08-10 NOTE — Op Note (Signed)
[facility] Patient Name: Dean Pratt Procedure Date: 08/10/2019 1:27 PM MRN: [id number] Endoscopist: Docia Chuck. Henrene Pastor , MD Age: 84 Referring MD:  Date of Birth: 04/04/1935 Gender: Male Account #: [id number] Procedure:                Colonoscopy with cold snare polypectomy x 8 Indications:              High risk colon cancer surveillance: Personal                            history of adenoma (10 mm or greater in size), High                            risk colon cancer surveillance: Personal history of                            multiple (3 or more) adenomas. ACQUIRED POLYPOSIS                            SYNDROME. Previous examinations include 2010, 2012,                            2013, 2016, 2019 Medicines:                Monitored Anesthesia Care Procedure:                Pre-Anesthesia Assessment:                           - Prior to the procedure, a History and Physical                            was performed, and patient medications and                            allergies were reviewed. The patient's tolerance of                            previous anesthesia was also reviewed. The risks                            and benefits of the procedure and the sedation                            options and risks were discussed with the patient.                            All questions were answered, and informed consent                            was obtained. Prior Anticoagulants: The patient has                            taken no previous anticoagulant or antiplatelet  agents. ASA Grade Assessment: II - A patient with                            mild systemic disease. After reviewing the risks                            and benefits, the patient was deemed in                            satisfactory condition to undergo the procedure.                           After obtaining informed consent, the colonoscope                            was passed under  direct vision. Throughout the                            procedure, the patient's blood pressure, pulse, and                            oxygen saturations were monitored continuously. The                            Colonoscope was introduced through the anus and                            advanced to the the cecum, identified by                            appendiceal orifice and ileocecal valve. The                            ileocecal valve, appendiceal orifice, and rectum                            were photographed. The quality of the bowel                            preparation was excellent. The colonoscopy was                            performed without difficulty. The patient tolerated                            the procedure well. The bowel preparation used was                            SUPREP via split dose instruction. Scope In: 1:39:57 PM Scope Out: 2:02:27 PM Scope Withdrawal Time: 0 hours 16 minutes 34 seconds  Total Procedure Duration: 0 hours 22 minutes 30 seconds  Findings:                 Eight polyps were found in the descending colon,  transverse colon and ascending colon. The polyps                            were 1 to 3 mm in size. These polyps were removed                            with a cold snare. Resection and retrieval were                            complete.                           A few small-mouthed diverticula were found in the                            sigmoid colon.                           Internal hemorrhoids were found during retroflexion.                           The exam was otherwise without abnormality on                            direct and retroflexion views. Complications:            No immediate complications. Estimated blood loss:                            None. Estimated Blood Loss:     Estimated blood loss: none. Impression:               - Eight 1 to 3 mm polyps in the descending colon,                             in the transverse colon and in the ascending colon,                            removed with a cold snare. Resected and retrieved.                           - Diverticulosis in the sigmoid colon.                           - Internal hemorrhoids.                           - The examination was otherwise normal on direct                            and retroflexion views. Recommendation:           - Repeat colonoscopy in 3 years for surveillance to                            be considered based overall health and  willingness,                            at that time.                           - Patient has a contact number available for                            emergencies. The signs and symptoms of potential                            delayed complications were discussed with the                            patient. Return to normal activities tomorrow.                            Written discharge instructions were provided to the                            patient.                           - Resume previous diet.                           - Continue present medications.                           - Await pathology results. Docia Chuck. Henrene Pastor, MD 08/10/2019 2:08:17 PM This report has been signed electronically.

## 2019-08-10 NOTE — Patient Instructions (Signed)

## 2019-08-10 NOTE — Progress Notes (Signed)
Called to room to assist during endoscopic procedure.  Patient ID and intended procedure confirmed with present staff. Received instructions for my participation in the procedure from the performing physician.  

## 2019-08-10 NOTE — Progress Notes (Signed)
Dr Henrene Pastor ok'd ephedrine

## 2019-08-10 NOTE — Progress Notes (Signed)
Report to PACU, RN, vss, BBS= Clear.  

## 2019-08-14 ENCOUNTER — Encounter: Payer: Self-pay | Admitting: Internal Medicine

## 2019-08-14 ENCOUNTER — Telehealth: Payer: Self-pay

## 2019-08-14 NOTE — Telephone Encounter (Signed)
  Follow up Call-  Call back number 08/10/2019 10/26/2017  Post procedure Call Back phone  # [phone]  Permission to leave phone message Yes Yes  Some recent data might be hidden     Patient questions:  Do you have a fever, pain , or abdominal swelling? No. Pain Score  0 *  Have you tolerated food without any problems? Yes.    Have you been able to return to your normal activities? Yes.  Pt states he's been "feeling nervous" since here but he is going to follow up with his cardiologist to make sure nothing is "out of rhythm".  Do you have any questions about your discharge instructions: Diet   No. Medications  No. Follow up visit  No.  Do you have questions or concerns about your Care? No.  Actions: * If pain score is 4 or above: No action needed, pain <4.  1. Have you developed a fever since your procedure? no  2.   Have you had an respiratory symptoms (SOB or cough) since your procedure? no  3.   Have you tested positive for COVID 19 since your procedure no  4.   Have you had any family members/close contacts diagnosed with the COVID 19 since your procedure?  no   If yes to any of these questions please route to Joylene John, RN and Erenest Rasher, RN

## 2019-09-06 NOTE — Progress Notes (Signed)
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  PRIOR THERAPY: None  CURRENT THERAPY: Phlebotomy on as-needed basis.  INTERVAL HISTORY: Dean Pratt 84 y.o. male returns to clinic today for a follow-up visit.  The patient is feeling well today without any concerning complaints.  He is currently following with gastroenterology regarding changes in his bowel habits and abdominal pain.  He had a colonoscopy which noted polyups. He still has achyness sensation across his lower abdomen. Otherwise he is doing well.  He denies any abnormal bleeding or bruising including melena, hematochezia, hematuria, hemoptysis, or hematemesis.  The patient reports rare and self-limiting epistaxis for which he has seen ENT in the past for this concern. Denies any bleeding recently. He takes 81 mg of aspirin.  He denies any early satiety, nausea, vomiting, or diarrhea.  He denies any chest pain, shortness of breath, or hemoptysis.  He denies any flushing. He reports dry itchy skin on his upper extremities. He also notes joint pains in his ankles, wrists, and shoulders. He denies headaches or vertigo but reports some lightheadedness intermittently upon standing. He is here today for evaluation and a repeat CBC and for consideration of therapeutic phlebotomy.   MEDICAL HISTORY: Past Medical History:  Diagnosis Date  . Anxiety   . Atrial flutter ([place])    s/p afib and atrial flutter ablation 04/09/09  . Blood transfusion without reported diagnosis   . BPH (benign prostatic hyperplasia)   . Cancer ([place])    basal cell CA removed  . Cataract    removed with lens implants   . Colitis, ischemic ([facility])    secondary to ebolism from afib 1/10  . Constipation   . GERD (gastroesophageal reflux disease)   . Gout   . HTN (hypertension)   . Hyperlipidemia   . Intracranial bleed ([facility]) 04/10/10   Right  occipital hematoma with a left homonymous hemianopsia   . Migraines   . Paroxysmal atrial fibrillation ([place])    s/p PVI 04/09/09 and 10/17/10  . Rosacea   . Stroke ([facility]) 04/08/2010   hemorragic, anticoagulation stopped at that time  . Tuberculosis    s/p treatment 1964    ALLERGIES:  is allergic to ace inhibitors and warfarin and related.  MEDICATIONS:  Current Outpatient Medications  Medication Sig Dispense Refill  . acetaminophen (TYLENOL) 325 MG tablet Take 650 mg by mouth every 6 (six) hours as needed.      Marland Kitchen allopurinol (ZYLOPRIM) 100 MG tablet Take 100 mg by mouth at bedtime.      . Ascorbic Acid (VITAMIN C) 1000 MG tablet Take 1,000 mg by mouth daily.     Marland Kitchen aspirin 81 MG tablet Take 81 mg by mouth daily.      . diclofenac sodium (VOLTAREN) 1 % GEL APPLY 2-4 GRAMS 2-3 TIMES DAILY AS NEEDED FOR PAIN 100 g 0  . diltiazem (CARDIZEM CD) 240 MG 24 hr capsule TAKE 1 CAPSULE DAILY. PLEASE CALL OFFICE TO SCHEDULE APPOINTMENT FOR FURTHER REFILLS 15 capsule 0  . donepezil (ARICEPT) 5 MG tablet Take 5 mg by mouth at bedtime.    . fluticasone (CUTIVATE) 0.05 % cream Apply 1 application topically daily.    Marland Kitchen ibuprofen (ADVIL,MOTRIN) 200 MG tablet Take 200 mg by mouth every 6 (six) hours as needed.    Marland Kitchen LORazepam (ATIVAN) 1 MG tablet TAKE 1/2-1 TABLET TWICE DAILY AS NEEDED  3  .  losartan (COZAAR) 25 MG tablet TAKE 1 TABLET BY MOUTH EVERY DAY 90 tablet 2  . methocarbamol (ROBAXIN) 500 MG tablet Take 1 tablet (500 mg total) by mouth every 8 (eight) hours as needed for muscle spasms. 60 tablet 2  . NON FORMULARY Stool softener 100 mg... Take 1 capsule by mouth once daily.     . Olopatadine HCl 0.2 % SOLN INSTILL 1 DROP INTO BOTH EYES TWICE A DAY    . rosuvastatin (CRESTOR) 10 MG tablet Take 10 mg by mouth daily.  2  . clobetasol (TEMOVATE) 0.05 % external solution APPLY TO AFECTED AREAS (SCALP) TWICE A DAY AS NEEDED NOT FOR FACE,GROIN,UNDER ARMS (Patient not taking: Reported on 08/10/2019)      Current Facility-Administered Medications  Medication Dose Route Frequency Provider Last Rate Last Admin  . 0.9 %  sodium chloride infusion  500 mL Intravenous Once Irene Shipper, MD      . bupivacaine (MARCAINE) 0.5 % (with pres) injection 3 mL  3 mL Other Once Magnus Sinning, MD        SURGICAL HISTORY:  Past Surgical History:  Procedure Laterality Date  . ablation  04/09/2009   s/p afib and atrial flutter ablation by JA  . ABLATION OF DYSRHYTHMIC FOCUS  10/15/09   repeat afib ablation by JA  . APPENDECTOMY    . CATARACT EXTRACTION    . CATARACT EXTRACTION, BILATERAL     with lens implants   . COLONOSCOPY    . CORNEAL LACERATION REPAIR    . EYE SURGERY    . INGUINAL HERNIA REPAIR    . POLYPECTOMY    . TONSILLECTOMY    . VASECTOMY      REVIEW OF SYSTEMS:   Review of Systems  Constitutional: Negative for appetite change, chills, fatigue, fever and unexpected weight change.  HENT: Negative for mouth sores, nosebleeds, sore throat and trouble swallowing.   Eyes: Negative for eye problems and icterus.  Respiratory: Negative for cough, hemoptysis, shortness of breath and wheezing.   Cardiovascular: Negative for chest pain and leg swelling.  Gastrointestinal: Positive for intermittent bilateral lower quadrant achyness. Negative for constipation, diarrhea, nausea and vomiting.  Genitourinary: Negative for bladder incontinence, difficulty urinating, dysuria, frequency and hematuria.   Musculoskeletal: Negative for back pain, gait problem, neck pain and neck stiffness.  Skin: Positive for mild itching on his upper extremities. Negative for rash.  Neurological: Positive for mild intermittent light headedness with standing. Negative for dizziness, extremity weakness, gait problem, headaches, and seizures.  Hematological: Negative for adenopathy. Does not bruise/bleed easily.  Psychiatric/Behavioral: Negative for confusion, depression and sleep disturbance. The patient is not  nervous/anxious.     PHYSICAL EXAMINATION:  Blood pressure 138/72, pulse 66, temperature (!) 97 F (36.1 C), temperature source Tympanic, resp. rate 18, height 5\' 10"  (1.778 m), weight 158 lb 1.6 oz (71.7 kg), SpO2 99 %.  ECOG PERFORMANCE STATUS: 1 - Symptomatic but completely ambulatory  Physical Exam  Constitutional: Oriented to person, place, and time and well-developed, well-nourished, and in no distress.  HENT:  Head: Normocephalic and atraumatic.  Mouth/Throat: Oropharynx is clear and moist. No oropharyngeal exudate.  Eyes: Conjunctivae are normal. Right eye exhibits no discharge. Left eye exhibits no discharge. No scleral icterus.  Neck: Normal range of motion. Neck supple.  Cardiovascular: Normal rate,irregular rhythm, normal heart sounds and intact distal pulses.  Pulmonary/Chest: Effort normal and breath sounds normal. No respiratory distress. No wheezes. No rales.  Abdominal: Soft. Bowel sounds are normal. Exhibits  no distension and no mass. There is no tenderness.  Musculoskeletal: Scoliosis noted on exam. Normal range of motion. Exhibits no edema.  Lymphadenopathy:  No cervical adenopathy.  Neurological: Alert and oriented to person, place, and time. Exhibits normal muscle tone. Gait normal. Coordination normal.  Skin: Lipoma on left upper back. Skin is warm and dry. No rash noted. Not diaphoretic. No erythema. No pallor.  Psychiatric: Mood, memory and judgment normal.  Vitals reviewed.  LABORATORY DATA: Lab Results  Component Value Date   WBC 9.2 09/13/2019   HGB 13.5 09/13/2019   HCT 46.5 09/13/2019   MCV 68.6 (L) 09/13/2019   PLT 608 (H) 09/13/2019      Chemistry      Component Value Date/Time   NA 139 09/13/2019 1023   K 3.7 09/13/2019 1023   CL 106 09/13/2019 1023   CO2 22 09/13/2019 1023   BUN 10 09/13/2019 1023   CREATININE 1.04 09/13/2019 1023      Component Value Date/Time   CALCIUM 9.7 09/13/2019 1023   ALKPHOS 78 09/13/2019 1023   AST 16  09/13/2019 1023   ALT 13 09/13/2019 1023   BILITOT 0.6 09/13/2019 1023       RADIOGRAPHIC STUDIES:  No results found.   ASSESSMENT/PLAN:  This is a very pleasant 84 year old Caucasian male diagnosed with polycythemia vera with a positive Jak 2 mutation.   The patient had repeat CBC performed today. His hematocrit is46.5. His MCV is low at 68 and his platelet count is elevated at 608k. Reviewed with Dr. Julien Nordmann who did not recommend a phlebotomy today. He believes his thrombocytosis is secondary to iron deficiency due to his MCV.   The goal is to keep his hematocrit ~45%.   We will see the patient back for follow-up visit in6weeks for evaluation and repeat blood work and therapeutic phlebotomy if needed.  The patient was advised to call immediately if she has any concerning symptoms in the interval. The patient voices understanding of current disease status and treatment options and is in agreement with the current care plan. All questions were answered. The patient knows to call the clinic with any problems, questions or concerns. We can certainly see the patient much sooner if necessary  Orders Placed This Encounter  Procedures  . CBC with Differential (Cancer Center Only)    Standing Status:   Future    Standing Expiration Date:   09/12/2020  . CMP ([facility] only)    Standing Status:   Future    Standing Expiration Date:   09/12/2020     Dean Deblanc L Wai Litt, PA-C 09/13/19

## 2019-09-13 ENCOUNTER — Inpatient Hospital Stay: Payer: Medicare HMO

## 2019-09-13 ENCOUNTER — Other Ambulatory Visit: Payer: Self-pay

## 2019-09-13 ENCOUNTER — Telehealth: Payer: Self-pay | Admitting: Physician Assistant

## 2019-09-13 ENCOUNTER — Inpatient Hospital Stay: Payer: Medicare HMO | Admitting: Physician Assistant

## 2019-09-13 ENCOUNTER — Inpatient Hospital Stay: Payer: Medicare HMO | Attending: Internal Medicine

## 2019-09-13 VITALS — BP 138/72 | HR 66 | Temp 97.0°F | Resp 18 | Ht 70.0 in | Wt 158.1 lb

## 2019-09-13 DIAGNOSIS — Z7982 Long term (current) use of aspirin: Secondary | ICD-10-CM | POA: Diagnosis not present

## 2019-09-13 DIAGNOSIS — I480 Paroxysmal atrial fibrillation: Secondary | ICD-10-CM | POA: Diagnosis not present

## 2019-09-13 DIAGNOSIS — N400 Enlarged prostate without lower urinary tract symptoms: Secondary | ICD-10-CM | POA: Diagnosis not present

## 2019-09-13 DIAGNOSIS — Z791 Long term (current) use of non-steroidal anti-inflammatories (NSAID): Secondary | ICD-10-CM | POA: Insufficient documentation

## 2019-09-13 DIAGNOSIS — D45 Polycythemia vera: Secondary | ICD-10-CM | POA: Diagnosis not present

## 2019-09-13 DIAGNOSIS — Z8673 Personal history of transient ischemic attack (TIA), and cerebral infarction without residual deficits: Secondary | ICD-10-CM | POA: Diagnosis not present

## 2019-09-13 DIAGNOSIS — M109 Gout, unspecified: Secondary | ICD-10-CM | POA: Insufficient documentation

## 2019-09-13 DIAGNOSIS — I10 Essential (primary) hypertension: Secondary | ICD-10-CM | POA: Insufficient documentation

## 2019-09-13 DIAGNOSIS — K219 Gastro-esophageal reflux disease without esophagitis: Secondary | ICD-10-CM | POA: Insufficient documentation

## 2019-09-13 DIAGNOSIS — Z79899 Other long term (current) drug therapy: Secondary | ICD-10-CM | POA: Insufficient documentation

## 2019-09-13 DIAGNOSIS — E785 Hyperlipidemia, unspecified: Secondary | ICD-10-CM | POA: Diagnosis not present

## 2019-09-13 LAB — CMP (CANCER CENTER ONLY)
ALT: 13 U/L (ref 0–44)
AST: 16 U/L (ref 15–41)
Albumin: 4.2 g/dL (ref 3.5–5.0)
Alkaline Phosphatase: 78 U/L (ref 38–126)
Anion gap: 11 (ref 5–15)
BUN: 10 mg/dL (ref 8–23)
CO2: 22 mmol/L (ref 22–32)
Calcium: 9.7 mg/dL (ref 8.9–10.3)
Chloride: 106 mmol/L (ref 98–111)
Creatinine: 1.04 mg/dL (ref 0.61–1.24)
GFR, Est AFR Am: >60 mL/min (ref >60)
GFR, Estimated: >60 mL/min (ref >60)
Glucose, Bld: 86 mg/dL (ref 70–99)
Potassium: 3.7 mmol/L (ref 3.5–5.1)
Sodium: 139 mmol/L (ref 135–145)
Total Bilirubin: 0.6 mg/dL (ref 0.3–1.2)
Total Protein: 6.9 g/dL (ref 6.5–8.1)

## 2019-09-13 LAB — CBC WITH DIFFERENTIAL (CANCER CENTER ONLY)
Abs Immature Granulocytes: 0.04 10*3/uL (ref 0.00–0.07)
Basophils Absolute: 0.1 10*3/uL (ref 0.0–0.1)
Basophils Relative: 1 %
Eosinophils Absolute: 0.1 10*3/uL (ref 0.0–0.5)
Eosinophils Relative: 1 %
HCT: 46.5 % (ref 39.0–52.0)
Hemoglobin: 13.5 g/dL (ref 13.0–17.0)
Immature Granulocytes: 0 %
Lymphocytes Relative: 12 %
Lymphs Abs: 1.1 10*3/uL (ref 0.7–4.0)
MCH: 19.9 pg — ABNORMAL LOW (ref 26.0–34.0)
MCHC: 29 g/dL — ABNORMAL LOW (ref 30.0–36.0)
MCV: 68.6 fL — ABNORMAL LOW (ref 80.0–100.0)
Monocytes Absolute: 0.7 10*3/uL (ref 0.1–1.0)
Monocytes Relative: 7 %
Neutro Abs: 7.2 10*3/uL (ref 1.7–7.7)
Neutrophils Relative %: 79 %
Platelet Count: 608 10*3/uL — ABNORMAL HIGH (ref 150–400)
RBC: 6.78 MIL/uL — ABNORMAL HIGH (ref 4.22–5.81)
RDW: 18.9 % — ABNORMAL HIGH (ref 11.5–15.5)
WBC Count: 9.2 10*3/uL (ref 4.0–10.5)
nRBC: 0 % (ref 0.0–0.2)

## 2019-09-13 LAB — LACTATE DEHYDROGENASE: LDH: 147 U/L (ref 98–192)

## 2019-09-13 IMAGING — MR MR LUMBAR SPINE W/O CM
5 series · 41 of 48 positions shown · non-contrast
Comparison: Lumbar MRI 10/16/2010.  Abdominal CT 02/28/2008.

CLINICAL DATA: Chronic progressive low back pain extending into
both legs. Temporary relief from injection 1 year ago. No specific
injury or prior relevant surgery.

EXAM:
MRI LUMBAR SPINE WITHOUT CONTRAST
TECHNIQUE: Multiplanar, multisequence MR imaging of the lumbar spine was
performed. No intravenous contrast was administered.

[Series 3: T2 post-contrast · sagittal · 4.0mm · 0.88mm/px · 5 of 13 slices shown]
[im 1/13]
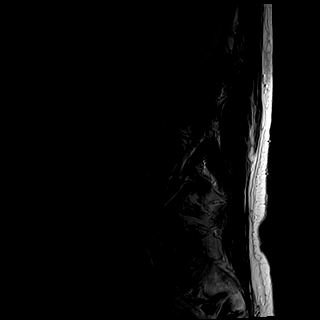
[im 4/13]
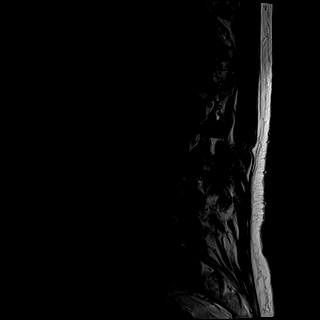
[im 7/13]
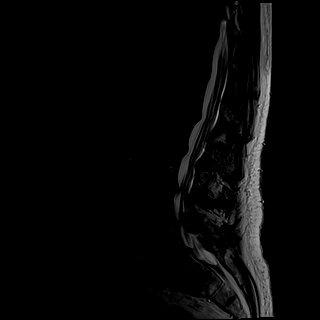
[im 10/13]
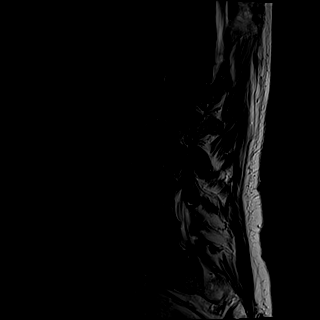
[im 13/13]
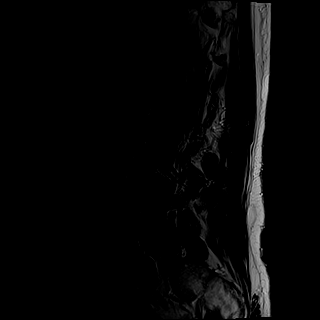

[Series 4: T1 · sagittal · 4.0mm · 0.88mm/px · 5 of 13 slices shown (1 of 2)]
[im 1/13]
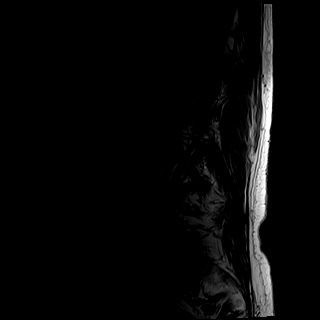
[im 4/13]
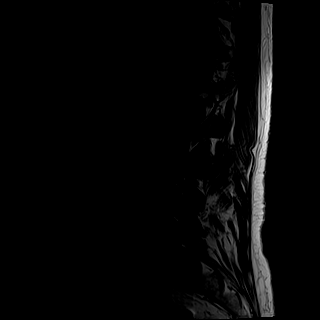
[im 7/13]
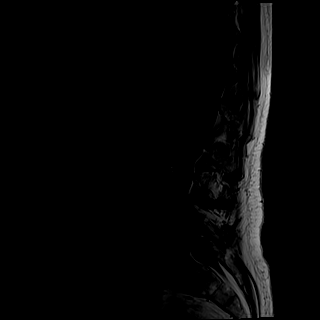
[im 10/13]
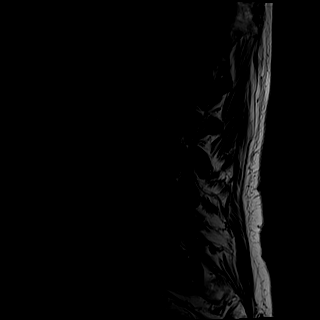
[im 13/13]
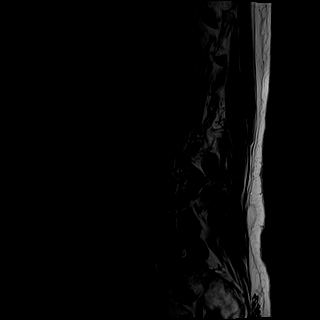

[Series 5: tirm sag · sagittal · 4.0mm · 0.55mm/px · 6 of 13 slices shown]
[im 1/13]
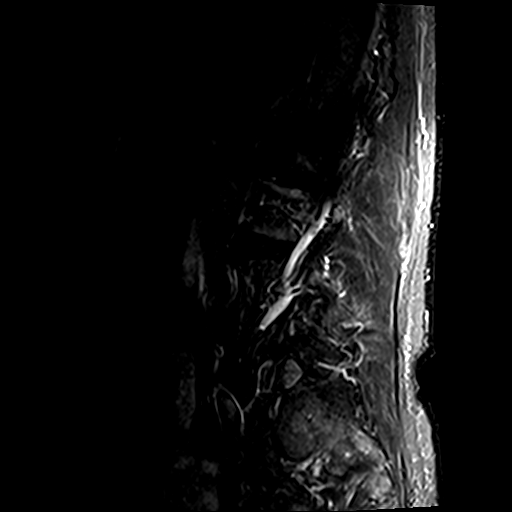
[im 3/13]
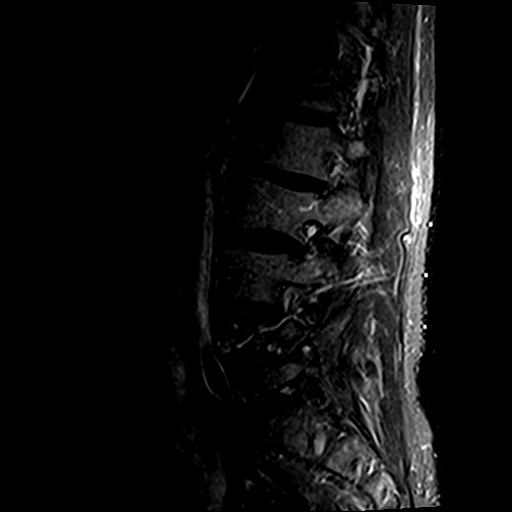
[im 5/13]
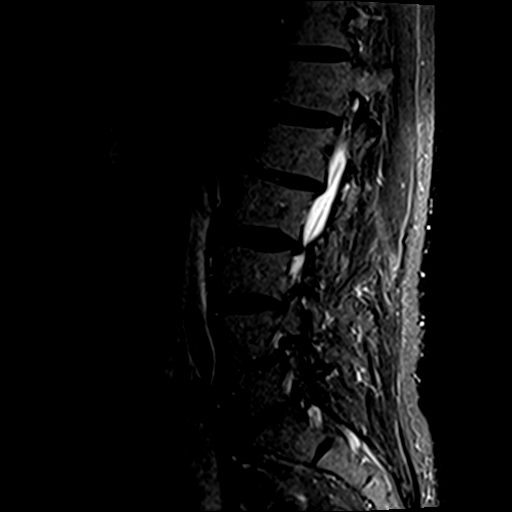
[im 8/13]
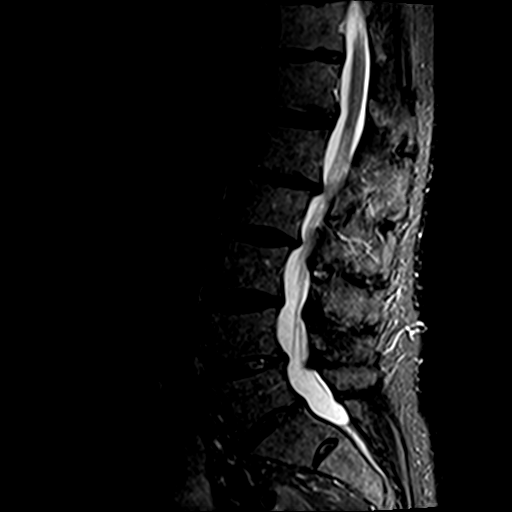
[im 10/13]
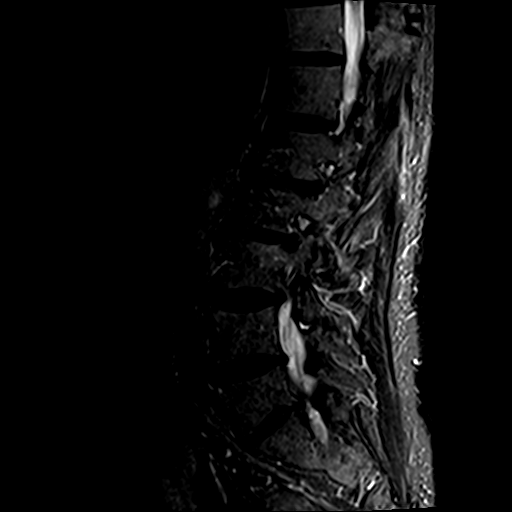
[im 13/13]
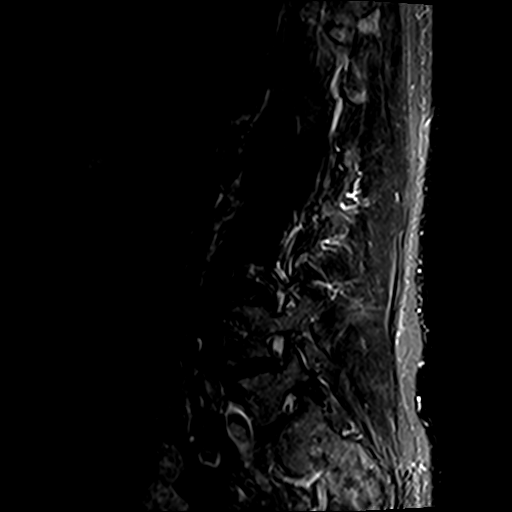

[Series 6: T1 · axial · 4.0mm · 0.78mm/px · z∈[-61,+146]mm · 10 of 37 slices shown (2 of 2)]
[im 3/37]
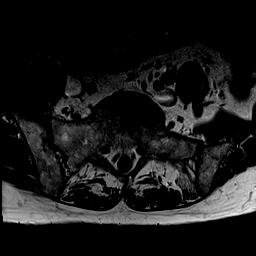
[im 5/37]
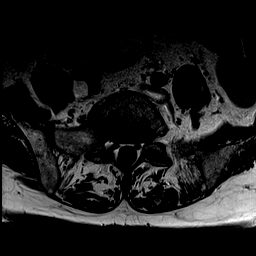
[im 8/37]
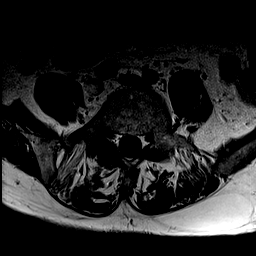
[im 13/37]
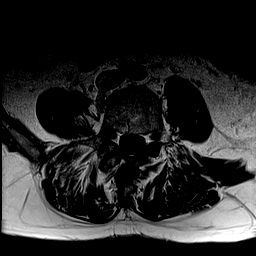
[im 17/37]
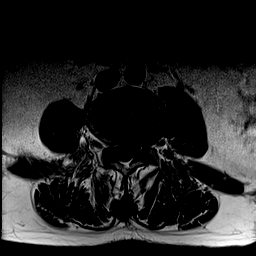
[im 20/37]
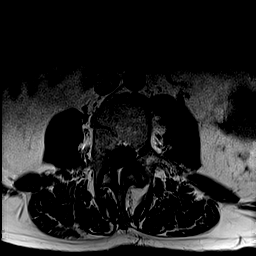
[im 22/37]
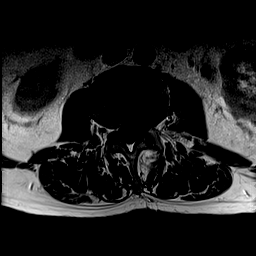
[im 27/37]
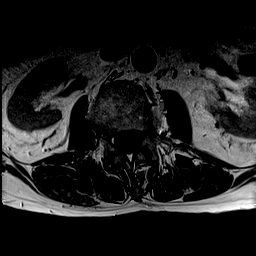
[im 32/37]
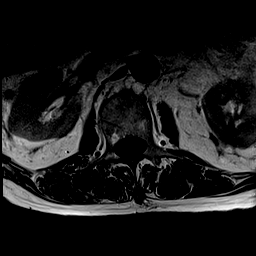
[im 37/37]
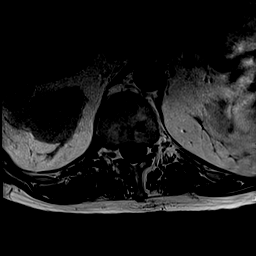

[Series 7: T2 · axial · 4.0mm · 0.78mm/px · z∈[-71,+146]mm · 15 of 37 slices shown]
[im 1/37]
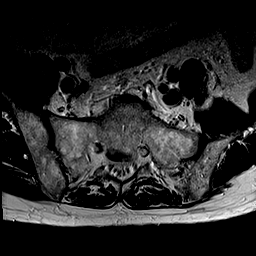
[im 3/37]
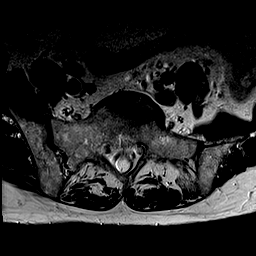
[im 5/37]
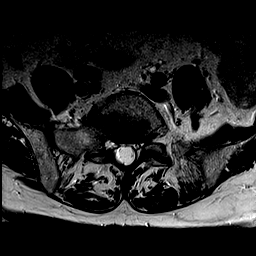
[im 8/37]
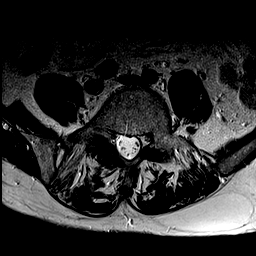
[im 10/37]
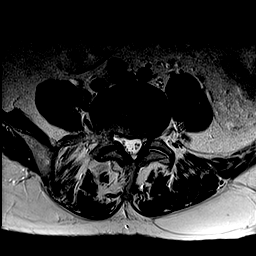
[im 13/37]
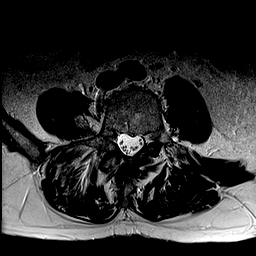
[im 15/37]
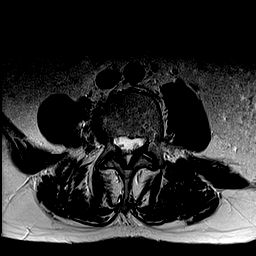
[im 17/37]
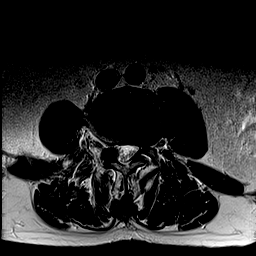
[im 20/37]
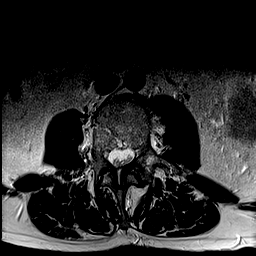
[im 22/37]
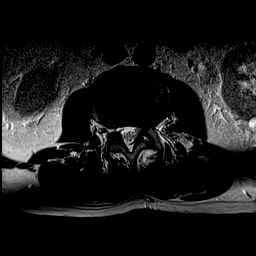
[im 25/37]
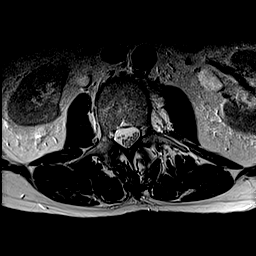
[im 27/37]
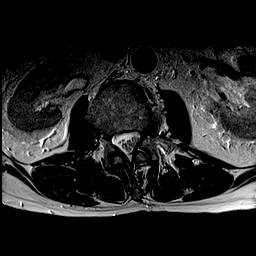
[im 29/37]
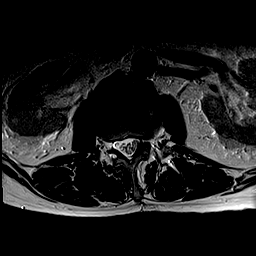
[im 32/37]
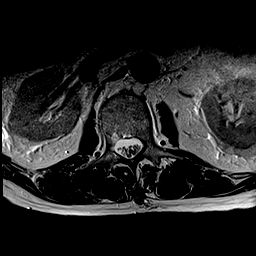
[im 37/37]
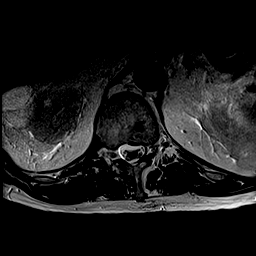

[41 of 48 positions shown; findings below may reference images not displayed]

FINDINGS: Segmentation: Conventional anatomy assumed, with the last open disc
space designated L5-S1.This numbering is concordant with previous
imaging.

Alignment: There is a convex right scoliosis centered at L2. The
lateral alignment is stable and near anatomic.

Vertebrae: No worrisome osseous lesion, acute fracture or pars
defect. There are scattered endplate degenerative changes. The
lumbar pedicles are somewhat short on a congenital basis. The
visualized sacroiliac joints appear unremarkable.

Conus medullaris: Extends to the T12-L1 level and appears normal.

Paraspinal and other soft tissues: Right renal cysts are partially
imaged.

Disc levels:

T12-L1: Chronic disc extrusion with cephalad extension has largely
involuted. No mass effect on the distal cord or foraminal
compromise.

L1-2: Chronic degenerative disc disease with loss of disc height,
annular disc bulging eccentric to the left and circumferential
endplate osteophytes. Mild facet hypertrophy. No significant spinal
stenosis or nerve root encroachment.

L2-3: Mildly progressive loss of disc height with annular disc
bulging and endplate osteophytes asymmetric to the left. Mild facet
and ligamentous hypertrophy. There is mild asymmetric narrowing of
the left lateral recess and left foramen which has mildly
progressed.

L3-4: Stable mild loss of disc height with annular disc bulging and
asymmetric facet hypertrophy on the right. Mild narrowing of the
right lateral recess and both foramina.

L4-5: Mildly progressive loss of disc height annular disc bulging
and endplate osteophytes asymmetric to the right. Right
extraforaminal disc extrusion has involuted with improved right
foraminal narrowing, now moderate. There is facet and ligamentous
hypertrophy contributing to mild narrowing of the lateral recesses.

L5-S1: Stable chronic degenerative disc disease with loss of disc
height and annular disc bulging eccentric the right. Mild facet
hypertrophy. Stable mild right-greater-than-left foraminal
narrowing.
IMPRESSION: 1. Compared with the prior study from 9309, no acute findings are
seen.
2. There is minimally progressive multilevel spondylosis with disc
bulging and endplate osteophytes. These contribute to mild lateral
recess and foraminal narrowing as described above. No acute findings
or definite nerve root compression. Right extraforaminal disc
extrusion at L4-5 has involuted and there is less right foraminal
narrowing.

## 2019-09-13 NOTE — Telephone Encounter (Signed)
Scheduled per 9/8 los. Printed avs and calendar for pt.

## 2019-09-15 ENCOUNTER — Other Ambulatory Visit: Payer: Self-pay

## 2019-09-15 MED ORDER — DICLOFENAC SODIUM 1 % EX GEL: 2.0000 g | Freq: Four times a day (QID) | CUTANEOUS | 3 refills | Status: DC

## 2019-09-22 DIAGNOSIS — M109 Gout, unspecified: Secondary | ICD-10-CM | POA: Diagnosis not present

## 2019-09-22 DIAGNOSIS — I480 Paroxysmal atrial fibrillation: Secondary | ICD-10-CM | POA: Diagnosis not present

## 2019-09-22 DIAGNOSIS — Z23 Encounter for immunization: Secondary | ICD-10-CM | POA: Diagnosis not present

## 2019-09-22 DIAGNOSIS — N182 Chronic kidney disease, stage 2 (mild): Secondary | ICD-10-CM | POA: Diagnosis not present

## 2019-09-22 DIAGNOSIS — R69 Illness, unspecified: Secondary | ICD-10-CM | POA: Diagnosis not present

## 2019-09-22 DIAGNOSIS — D751 Secondary polycythemia: Secondary | ICD-10-CM | POA: Diagnosis not present

## 2019-09-22 DIAGNOSIS — R413 Other amnesia: Secondary | ICD-10-CM | POA: Diagnosis not present

## 2019-09-22 DIAGNOSIS — E785 Hyperlipidemia, unspecified: Secondary | ICD-10-CM | POA: Diagnosis not present

## 2019-09-22 DIAGNOSIS — L723 Sebaceous cyst: Secondary | ICD-10-CM | POA: Diagnosis not present

## 2019-09-22 DIAGNOSIS — D45 Polycythemia vera: Secondary | ICD-10-CM | POA: Diagnosis not present

## 2019-09-22 DIAGNOSIS — I129 Hypertensive chronic kidney disease with stage 1 through stage 4 chronic kidney disease, or unspecified chronic kidney disease: Secondary | ICD-10-CM | POA: Diagnosis not present

## 2019-09-22 DIAGNOSIS — D692 Other nonthrombocytopenic purpura: Secondary | ICD-10-CM | POA: Diagnosis not present

## 2019-10-09 ENCOUNTER — Telehealth: Payer: Self-pay | Admitting: Internal Medicine

## 2019-10-09 NOTE — Telephone Encounter (Signed)
New message   STAT if HR is under 50 or over 120 (normal HR is 60-100 beats per minute)  1) What is your heart rate? Pt does not have a way to check his heart rate  2) Do you have a log of your heart rate readings (document readings)? no  3) Do you have any other symptoms? Pt states that he feels his heart rate is irregular and beating fast. He said that he feels like he is having hot flashes in his face. Pt scheduled for appt with AT on 10.13.21

## 2019-10-10 NOTE — Telephone Encounter (Signed)
He is starting to feel a little better today. He feels like it was some nervousness and he felt jumpy. But this is occurring less today then yesterday. He is having some hot flashes still. Today since he has been up he has only had one episode that felt like half of what they were yesterday. The patient had felt the fast racing heart beat in his chest yesterday but this has not occurred today. He has taken all of his medications without missing any doses. Of note he did take a Lorazepam 1 mg PRN tablet once yesterday and rested most of the evening. This seemed to help him feel better.   Advised patient we will see patient as scheduled on the 13th with Oda Kilts. IF the symptoms return and do not go away to call us back.

## 2019-10-17 NOTE — Progress Notes (Signed)
PCP:  [name], MD Primary Cardiologist: No primary care provider on file. Electrophysiologist: [name], MD   Dean Pratt is a 84 y.o. male seen today for [name], MD for acute visit due to hot flashes.  Since last being seen in our clinic the patient reports doing overall well. He is seen at the cancer center every 4-6 weeks for therapeutic phlebotomy for polycythemia. He has been having night sweats/hot flashes at night over the past several weeks. He says at times they are associated with a "funny feeling" in his upper abdomen. Denies N/V or tachy-palpitations. This improves if he gets up for a few minutes and uses a cool compress/cloth.  he denies chest pain, dyspnea, PND, orthopnea, nausea, vomiting, dizziness, syncope, edema, weight gain, or early satiety. He has had an occasional nose bleed over the past few weeks as well as the weather has changed. None severe. He has been in AF since at least 2016. He doesn't think these symptoms "have anything to do with his heart" he just wanted to make sure.   Past Medical History:  Diagnosis Date  . Anxiety   . Atrial flutter ([facility])    s/p afib and atrial flutter ablation 04/09/09  . Blood transfusion without reported diagnosis   . BPH (benign prostatic hyperplasia)   . Cancer ([facility])    basal cell CA removed  . Cataract    removed with lens implants   . Colitis, ischemic ([place])    secondary to ebolism from afib 1/10  . Constipation   . GERD (gastroesophageal reflux disease)   . Gout   . HTN (hypertension)   . Hyperlipidemia   . Intracranial bleed ([facility]) 04/10/10   Right occipital hematoma with a left homonymous hemianopsia   . Migraines   . Paroxysmal atrial fibrillation ([place])    s/p PVI 04/09/09 and 10/17/10  . Rosacea   . Stroke ([place]) 04/08/2010   hemorragic, anticoagulation stopped at that time  . Tuberculosis    s/p treatment 1964   Past Surgical History:  Procedure Laterality Date  . ablation  04/09/2009   s/p afib and  atrial flutter ablation by [facility]  . ABLATION OF DYSRHYTHMIC FOCUS  10/15/09   repeat afib ablation by [facility]  . APPENDECTOMY    . CATARACT EXTRACTION    . CATARACT EXTRACTION, BILATERAL     with lens implants   . COLONOSCOPY    . CORNEAL LACERATION REPAIR    . EYE SURGERY    . INGUINAL HERNIA REPAIR    . POLYPECTOMY    . TONSILLECTOMY    . VASECTOMY      Current Outpatient Medications  Medication Sig Dispense Refill  . acetaminophen (TYLENOL) 325 MG tablet Take 650 mg by mouth every 6 (six) hours as needed.      Marland Kitchen allopurinol (ZYLOPRIM) 100 MG tablet Take 100 mg by mouth at bedtime.      . Ascorbic Acid (VITAMIN C) 1000 MG tablet Take 1,000 mg by mouth daily.     Marland Kitchen aspirin 81 MG tablet Take 81 mg by mouth daily.      . diclofenac Sodium (VOLTAREN) 1 % GEL Apply 2-4 g topically 4 (four) times daily. 200 g 3  . diltiazem (CARDIZEM CD) 240 MG 24 hr capsule TAKE 1 CAPSULE DAILY. PLEASE CALL OFFICE TO SCHEDULE APPOINTMENT FOR FURTHER REFILLS 15 capsule 0  . donepezil (ARICEPT) 5 MG tablet Take 5 mg by mouth at bedtime.    . fluticasone (  CUTIVATE) 0.05 % cream Apply 1 application topically daily.    Marland Kitchen ibuprofen (ADVIL,MOTRIN) 200 MG tablet Take 200 mg by mouth every 6 (six) hours as needed.    Marland Kitchen LORazepam (ATIVAN) 1 MG tablet TAKE 1/2-1 TABLET TWICE DAILY AS NEEDED  3  . losartan (COZAAR) 25 MG tablet TAKE 1 TABLET BY MOUTH EVERY DAY 90 tablet 2  . methocarbamol (ROBAXIN) 500 MG tablet Take 1 tablet (500 mg total) by mouth every 8 (eight) hours as needed for muscle spasms. 60 tablet 2  . NON FORMULARY Stool softener 100 mg... Take 1 capsule by mouth once daily.     . Olopatadine HCl 0.2 % SOLN INSTILL 1 DROP INTO BOTH EYES TWICE A DAY    . rosuvastatin (CRESTOR) 10 MG tablet Take 10 mg by mouth daily.  2   Current Facility-Administered Medications  Medication Dose Route Frequency Provider Last Rate Last Admin  . 0.9 %  sodium chloride infusion  500 mL Intravenous Once Irene Shipper, MD      .  bupivacaine (MARCAINE) 0.5 % (with pres) injection 3 mL  3 mL Other Once Magnus Sinning, MD        Allergies  Allergen Reactions  . Ace Inhibitors Cough  . Warfarin And Related     Intercranial bleed    Social History   Socioeconomic History  . Marital status: Married    Spouse name: Not on file  . Number of children: Not on file  . Years of education: Not on file  . Highest education level: Not on file  Occupational History  . Not on file  Tobacco Use  . Smoking status: Former Research scientist (life sciences)  . Smokeless tobacco: Never Used  Vaping Use  . Vaping Use: Never used  Substance and Sexual Activity  . Alcohol use: No  . Drug use: No  . Sexual activity: Not on file  Other Topics Concern  . Not on file  Social History Narrative   Pt lives in [place].    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  He is married and lives at home with his wife.  He used to smoke but quit  smoking many years ago.  Does get regular exercise.  No alcohol use.    Social Determinants of Health   Financial Resource Strain:   . Difficulty of Paying Living Expenses: Not on file  Food Insecurity:   . Worried About Charity fundraiser in the Last Year: Not on file  . Ran Out of Food in the Last Year: Not on file  Transportation Needs:   . Lack of Transportation (Medical): Not on file  . Lack of Transportation (Non-Medical): Not on file  Physical Activity:   . Days of Exercise per Week: Not on file  . Minutes of Exercise per Session: Not on file  Stress:   . Feeling of Stress : Not on file  Social Connections:   . Frequency of Communication with Friends and Family: Not on file  . Frequency of Social Gatherings with Friends and Family: Not on file  . Attends Religious Services: Not on file  . Active Member of Clubs or Organizations: Not on file  . Attends Archivist Meetings: Not on file  . Marital Status: Not on file  Intimate Partner  Violence:   . Fear of Current or Ex-Partner: Not on file  . Emotionally Abused: Not on file  .  Physically Abused: Not on file  . Sexually Abused: Not on file     Review of Systems: General: No chills, fever, night sweats or weight changes  Cardiovascular:  No chest pain, dyspnea on exertion, edema, orthopnea, palpitations, paroxysmal nocturnal dyspnea Dermatological: No rash, lesions or masses Respiratory: No cough, dyspnea Urologic: No hematuria, dysuria Abdominal: No nausea, vomiting, diarrhea, bright red blood per rectum, melena, or hematemesis Neurologic: No visual changes, weakness, changes in mental status All other systems reviewed and are otherwise negative except as noted above.  Physical Exam: Vitals:   10/18/19 0810  BP: 122/70  Pulse: 83  Weight: 159 lb 9.6 oz (72.4 kg)  Height: 5\' 9"  (1.753 m)    GEN- The patient is well appearing, alert and oriented x 3 today.   HEENT: normocephalic, atraumatic; sclera clear, conjunctiva pink; hearing intact; oropharynx clear; neck supple, no JVP Lymph- no cervical lymphadenopathy Lungs- Clear to ausculation bilaterally, normal work of breathing.  No wheezes, rales, rhonchi Heart- Regular rate and rhythm, no murmurs, rubs or gallops, PMI not laterally displaced GI- soft, non-tender, non-distended, bowel sounds present, no hepatosplenomegaly Extremities- no clubbing, cyanosis, or edema; DP/PT/radial pulses 2+ bilaterally MS- no significant deformity or atrophy Skin- warm and dry, no rash or lesion Psych- euthymic mood, full affect Neuro- strength and sensation are intact  EKG is ordered. Personal review of EKG from today shows atrial fibrillation with controlled ventricular rate at 63 bpm  Additional studies reviewed include: Previous EP office notes, Echo 12/2017 LVEF 60-65%  Assessment and Plan:  1. Persistent atrial fibrillation He had flutter ablation and PVI in 04/2009 and repeat PVI in 10/2010 Currently with rate  controlled AF and is asymptomatic from this standpoint He is off [place] with h/o ICH on coumadin.  CHA2DS2VASC of at least 4   Echo normal 12/2017, Pt deferred consideration of repeat at this time.   2. HTN Continue current regimen  3. Polycythemia Vera He undergoes therapeutic phlebotomy every 4-6 weeks. Scheduled for Thursday, 10/14. He denies having had his current symptoms before leading up to phlebotomy.   4. Hot flashes/Night sweats Has happened several nights a week over the past month or so.  Labwork today and will forward to PCP if abnormal. I recommended PCP follow up for further symptoms.    Shirley Friar, PA-C  10/18/19 8:15 AM

## 2019-10-18 ENCOUNTER — Telehealth: Payer: Self-pay | Admitting: *Deleted

## 2019-10-18 ENCOUNTER — Other Ambulatory Visit: Payer: Self-pay

## 2019-10-18 ENCOUNTER — Ambulatory Visit: Payer: Medicare HMO | Admitting: Student

## 2019-10-18 ENCOUNTER — Encounter: Payer: Self-pay | Admitting: Student

## 2019-10-18 VITALS — BP 122/70 | HR 83 | Ht 69.0 in | Wt 159.6 lb

## 2019-10-18 DIAGNOSIS — R232 Flushing: Secondary | ICD-10-CM

## 2019-10-18 DIAGNOSIS — I4819 Other persistent atrial fibrillation: Secondary | ICD-10-CM | POA: Diagnosis not present

## 2019-10-18 DIAGNOSIS — D45 Polycythemia vera: Secondary | ICD-10-CM

## 2019-10-18 DIAGNOSIS — D179 Benign lipomatous neoplasm, unspecified: Secondary | ICD-10-CM | POA: Diagnosis not present

## 2019-10-18 DIAGNOSIS — I10 Essential (primary) hypertension: Secondary | ICD-10-CM | POA: Diagnosis not present

## 2019-10-18 LAB — CBC WITH DIFFERENTIAL/PLATELET

## 2019-10-18 NOTE — Telephone Encounter (Signed)
   [facility] Pre-operative Risk Assessment    HEARTCARE STAFF: - Please ensure there is not already an duplicate clearance open for this procedure. - Under Visit Info/Reason for Call, type in Other and utilize the format Clearance MM/DD/YY or Clearance TBD. Do not use dashes or single digits. - If request is for dental extraction, please clarify the # of teeth to be extracted.  Request for surgical clearance:  1. What type of surgery is being performed? EXCISION OF LIPOMA FROM LEFT BACK    2. When is this surgery scheduled? TBD   3. What type of clearance is required (medical clearance vs. Pharmacy clearance to hold med vs. Both)? MEDICAL  4. Are there any medications that need to be held prior to surgery and how long? ASA    5. Practice name and name of physician performing surgery? [facility]; DR. Harrell Gave [facility]   6. What is the office phone number? [phone]   7.   What is the office fax number? [phone] ATTN: JACQUELINE HAGGETT, RMA  8.   Anesthesia type (None, local, MAC, general) ? CHOICE   Julaine Hua 10/18/2019, 1:21 PM  _________________________________________________________________   (provider comments below)

## 2019-10-18 NOTE — Patient Instructions (Signed)
Medication Instructions:  *If you need a refill on your cardiac medications before your next appointment, please call your pharmacy*  Lab Work: Your physician has recommended that you have lab work today: CMET, CBC, and TSH If you have labs (blood work) drawn today and your tests are completely normal, you will receive your results only by: Marland Kitchen MyChart Message (if you have MyChart) OR . A paper copy in the mail If you have any lab test that is abnormal or we need to change your treatment, we will call you to review the results.  Follow-Up: At [facility], you and your health needs are our priority.  As part of our continuing mission to provide you with exceptional heart care, we have created designated Provider Care Teams.  These Care Teams include your primary Cardiologist (physician) and Advanced Practice Providers (APPs -  Physician Assistants and Nurse Practitioners) who all work together to provide you with the care you need, when you need it.  We recommend signing up for the patient portal called "MyChart".  Sign up information is provided on this After Visit Summary.  MyChart is used to connect with patients for Virtual Visits (Telemedicine).  Patients are able to view lab/test results, encounter notes, upcoming appointments, etc.  Non-urgent messages can be sent to your provider as well.   To learn more about what you can do with MyChart, go to [email or web address].    Your next appointment:   Your physician wants you to follow-up in: 59 MONTHS with Dr. Rayann Heman.  You will receive a reminder letter in the mail two months in advance. If you don't receive a letter, please call our office to schedule the follow-up appointment.   The format for your next appointment:   In Person with [name], MD

## 2019-10-18 NOTE — Telephone Encounter (Signed)
[place] Prindle 84 year old male is requesting an excision of lipoma from his back.  He was last seen in the clinic today 10/18/2019.  He denied cardiac symptoms at that time.  Main complaint was of hot flashes/night sweats.  Lab work drawn.  Has not yet resulted.  May his aspirin be held for his upcoming procedure?  His PMH includes anxiety, atrial flutter, BPH, cataract, GERD, hypertension, hyperlipidemia, intracranial bleeding, paroxysmal atrial fibrillation, and TB.  Please direct response to CV DIV preop pool.  Thank you for your help.  Jossie Ng. Jhovani Griswold NP-C    10/18/2019, 2:00 PM [address] Office [phone] Fax [phone]

## 2019-10-19 LAB — CBC WITH DIFFERENTIAL/PLATELET
Basophils Absolute: 0.1 10*3/uL (ref 0.0–0.2)
Basos: 1 %
EOS (ABSOLUTE): 0.1 10*3/uL (ref 0.0–0.4)
Eos: 1 %
Hematocrit: 47.2 % (ref 37.5–51.0)
Hemoglobin: 14.1 g/dL (ref 13.0–17.7)
Immature Grans (Abs): 0 10*3/uL (ref 0.0–0.1)
Immature Granulocytes: 0 %
Lymphocytes Absolute: 1.1 10*3/uL (ref 0.7–3.1)
Lymphs: 11 %
MCH: 20.1 pg — ABNORMAL LOW (ref 26.6–33.0)
MCHC: 29.9 g/dL — ABNORMAL LOW (ref 31.5–35.7)
MCV: 67 fL — ABNORMAL LOW (ref 79–97)
Monocytes Absolute: 0.9 10*3/uL (ref 0.1–0.9)
Monocytes: 9 %
Neutrophils Absolute: 7.6 10*3/uL — ABNORMAL HIGH (ref 1.4–7.0)
Neutrophils: 78 %
Platelets: 712 10*3/uL — ABNORMAL HIGH (ref 150–450)
RBC: 7.01 x10E6/uL (ref 4.14–5.80)
RDW: 18.5 % — ABNORMAL HIGH (ref 11.6–15.4)
WBC: 10 10*3/uL (ref 3.4–10.8)

## 2019-10-19 LAB — COMPREHENSIVE METABOLIC PANEL
ALT: 14 IU/L (ref 0–44)
AST: 17 IU/L (ref 0–40)
Albumin/Globulin Ratio: 2 (ref 1.2–2.2)
Albumin: 4.5 g/dL (ref 3.6–4.6)
Alkaline Phosphatase: 82 IU/L (ref 44–121)
BUN/Creatinine Ratio: 10 (ref 10–24)
BUN: 12 mg/dL (ref 8–27)
Bilirubin Total: 0.5 mg/dL (ref 0.0–1.2)
CO2: 24 mmol/L (ref 20–29)
Calcium: 10.3 mg/dL — ABNORMAL HIGH (ref 8.6–10.2)
Chloride: 102 mmol/L (ref 96–106)
Creatinine, Ser: 1.23 mg/dL (ref 0.76–1.27)
GFR calc Af Amer: 63 mL/min/{1.73_m2} (ref >59)
GFR calc non Af Amer: 54 mL/min/{1.73_m2} — ABNORMAL LOW (ref >59)
Globulin, Total: 2.2 g/dL (ref 1.5–4.5)
Glucose: 45 mg/dL — ABNORMAL LOW (ref 65–99)
Potassium: 4.9 mmol/L (ref 3.5–5.2)
Sodium: 143 mmol/L (ref 134–144)
Total Protein: 6.7 g/dL (ref 6.0–8.5)

## 2019-10-19 LAB — TSH: TSH: 2.06 u[IU]/mL (ref 0.450–4.500)

## 2019-10-20 NOTE — Progress Notes (Signed)
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  PRIOR THERAPY: none  CURRENT THERAPY: Phlebotomy on as-needed basis.  INTERVAL HISTORY: Dean Pratt 84 y.o. male returns to clinic today for a follow-up visit.  The patient is feeling well today without any concerning complaints except for intermittent dizzy spells when he turns his head too fast. Otherwise he is doing well.  He denies any abnormal bleeding or bruising including melena, hematochezia, hematuria, hemoptysis, or hematemesis except for occasional mild self limiting nose bleeds. He has seen ENT in the past for this concern. He takes 81 mg of aspirin.  He denies any early satiety, nausea, vomiting, or diarrhea. He reports that he had night sweats on and off for a span of 1 month. He states he has not had any night sweats in the last 2 weeks or so.   He reports sometimes his face gets hot as well. He reports dry itchy skin on his upper extremities. He also notes joint pains in his ankles, wrists, and shoulders. He denies headaches. He is here today for evaluation and a repeat CBC and for consideration of therapeutic phlebotomy.  MEDICAL HISTORY: Past Medical History:  Diagnosis Date  . Anxiety   . Atrial flutter ([place])    s/p afib and atrial flutter ablation 04/09/09  . Blood transfusion without reported diagnosis   . BPH (benign prostatic hyperplasia)   . Cancer ([place])    basal cell CA removed  . Cataract    removed with lens implants   . Colitis, ischemic ([place])    secondary to ebolism from afib 1/10  . Constipation   . GERD (gastroesophageal reflux disease)   . Gout   . HTN (hypertension)   . Hyperlipidemia   . Intracranial bleed ([place]) 04/10/10   Right occipital hematoma with a left homonymous hemianopsia   . Migraines   . Paroxysmal atrial fibrillation ([place])    s/p PVI 04/09/09 and 10/17/10  . Rosacea   .  Stroke ([place]) 04/08/2010   hemorragic, anticoagulation stopped at that time  . Tuberculosis    s/p treatment 1964    ALLERGIES:  is allergic to ace inhibitors and warfarin and related.  MEDICATIONS:  Current Outpatient Medications  Medication Sig Dispense Refill  . acetaminophen (TYLENOL) 325 MG tablet Take 650 mg by mouth every 6 (six) hours as needed.      Marland Kitchen allopurinol (ZYLOPRIM) 100 MG tablet Take 100 mg by mouth at bedtime.      . Ascorbic Acid (VITAMIN C) 1000 MG tablet Take 1,000 mg by mouth daily.     Marland Kitchen aspirin 81 MG tablet Take 81 mg by mouth daily.      . diclofenac Sodium (VOLTAREN) 1 % GEL Apply 2-4 g topically 4 (four) times daily. 200 g 3  . diltiazem (CARDIZEM CD) 240 MG 24 hr capsule TAKE 1 CAPSULE DAILY. PLEASE CALL OFFICE TO SCHEDULE APPOINTMENT FOR FURTHER REFILLS 15 capsule 0  . donepezil (ARICEPT) 5 MG tablet Take 5 mg by mouth at bedtime.    . fluticasone (CUTIVATE) 0.05 % cream Apply 1 application topically daily.    Marland Kitchen ibuprofen (ADVIL,MOTRIN) 200 MG tablet Take 200 mg by mouth every 6 (six) hours as needed.    Marland Kitchen LORazepam (ATIVAN) 1 MG tablet TAKE 1/2-1 TABLET TWICE DAILY AS NEEDED  3  . losartan (COZAAR) 25 MG tablet TAKE 1 TABLET BY MOUTH  EVERY DAY 90 tablet 2  . methocarbamol (ROBAXIN) 500 MG tablet Take 1 tablet (500 mg total) by mouth every 8 (eight) hours as needed for muscle spasms. 60 tablet 2  . NON FORMULARY Stool softener 100 mg... Take 1 capsule by mouth once daily.     . Olopatadine HCl 0.2 % SOLN INSTILL 1 DROP INTO BOTH EYES TWICE A DAY    . rosuvastatin (CRESTOR) 10 MG tablet Take 10 mg by mouth daily.  2   Current Facility-Administered Medications  Medication Dose Route Frequency Provider Last Rate Last Admin  . 0.9 %  sodium chloride infusion  500 mL Intravenous Once Irene Shipper, MD      . bupivacaine (MARCAINE) 0.5 % (with pres) injection 3 mL  3 mL Other Once Magnus Sinning, MD        SURGICAL HISTORY:  Past Surgical History:  Procedure  Laterality Date  . ablation  04/09/2009   s/p afib and atrial flutter ablation by JA  . ABLATION OF DYSRHYTHMIC FOCUS  10/15/09   repeat afib ablation by JA  . APPENDECTOMY    . CATARACT EXTRACTION    . CATARACT EXTRACTION, BILATERAL     with lens implants   . COLONOSCOPY    . CORNEAL LACERATION REPAIR    . EYE SURGERY    . INGUINAL HERNIA REPAIR    . POLYPECTOMY    . TONSILLECTOMY    . VASECTOMY      REVIEW OF SYSTEMS:   Constitutional: Positive for occasional night sweats. Negative for appetite change, chills, fever and unexpected weight change.  HENT: Positive for occasional self limiting nosebleeds. Negative for mouth sores, sore throat and trouble swallowing.   Eyes: Negative for eye problems and icterus.  Respiratory: Negative for cough, hemoptysis, shortness of breath and wheezing.   Cardiovascular: Negative for chest pain and leg swelling.  Gastrointestinal: Negative for constipation, diarrhea, nausea and vomiting.  Genitourinary: Negative for bladder incontinence, difficulty urinating, dysuria, frequency and hematuria.   Musculoskeletal: Negative for back pain, gait problem, neck pain and neck stiffness.  Skin: Positive for mild itching on his upper extremities. Negative for rash.  Neurological: Positive for mild intermittent light headedness with standing and vertigo with certain movements of his head. Negative for dizziness, extremity weakness, gait problem, and seizures.  Hematological: Negative for adenopathy. Does not bruise/bleed easily.  Psychiatric/Behavioral: Negative for confusion, depression and sleep disturbance. The patient is not nervous/anxious.     PHYSICAL EXAMINATION:  Blood pressure 133/87, pulse (!) 55, temperature (!) 96.8 F (36 C), temperature source Tympanic, resp. rate 16, height 5\' 9"  (1.753 m), weight 162 lb (73.5 kg), SpO2 100 %.  ECOG PERFORMANCE STATUS: 1 - Symptomatic but completely ambulatory  Physical Exam  Constitutional: Oriented to  person, place, and time and well-developed, well-nourished, and in no distress.  HENT:  Head: Normocephalic and atraumatic.  Mouth/Throat: Oropharynx is clear and moist. No oropharyngeal exudate.  Eyes: Conjunctivae are normal. Right eye exhibits no discharge. Left eye exhibits no discharge. No scleral icterus.  Neck: Normal range of motion. Neck supple.  Cardiovascular: Normal rate,irregular rhythm, normal heart sounds and intact distal pulses.  Pulmonary/Chest: Effort normal and breath sounds normal. No respiratory distress. No wheezes. No rales.  Abdominal: Soft. Bowel sounds are normal. Exhibits no distension and no mass. There is no tenderness.  Musculoskeletal: Scoliosis noted on exam.Normal range of motion. Exhibits no edema.  Lymphadenopathy:  No cervical adenopathy.  Neurological: Alert and oriented to person, place, and  time. Exhibits normal muscle tone. Gait normal. Coordination normal.  Skin:Lipoma on left upper back.Skin is warm and dry. No rash noted. Not diaphoretic. No erythema. No pallor.  Psychiatric: Mood, memory and judgment normal.  Vitals reviewed.  LABORATORY DATA: Lab Results  Component Value Date   WBC 9.3 10/25/2019   HGB 13.4 10/25/2019   HCT 45.5 10/25/2019   MCV 68.2 (L) 10/25/2019   PLT 573 (H) 10/25/2019      Chemistry      Component Value Date/Time   NA 141 10/25/2019 1029   NA 143 10/18/2019 0836   K 3.9 10/25/2019 1029   CL 109 10/25/2019 1029   CO2 25 10/25/2019 1029   BUN 9 10/25/2019 1029   BUN 12 10/18/2019 0836   CREATININE 1.02 10/25/2019 1029      Component Value Date/Time   CALCIUM 9.5 10/25/2019 1029   ALKPHOS 74 10/25/2019 1029   AST 17 10/25/2019 1029   ALT 13 10/25/2019 1029   BILITOT 0.6 10/25/2019 1029       RADIOGRAPHIC STUDIES:  No results found.   ASSESSMENT/PLAN:  This is a very pleasant 84 year old Caucasian male diagnosed with polycythemia vera with a positive Jak 2 mutation.   The patient had repeat  CBC performed today. His hematocrit is45.5 His MCV is low at 68.2 and his platelet count is elevated at 573k. Reviewed with Dr. Julien Nordmann. The patient will not have a phlebotomy today. He believes his thrombocytosis is secondary to iron deficiency due to his MCV.   The goal is to keep his hematocrit ~45%.   We will see the patient back for follow-up visit in8weeks for evaluation and repeat blood work and therapeutic phlebotomy if needed.  He will continue to take his 81 mg aspirin  For his dizziness, encouraged the patient to follow up with his PCP.   His nosebleeds are self limiting, however, if they continue to get worse, I encouraged the patient to follow with his ENT for consideration of cauterization if amenable to this.   The patient was advised to call immediately if he has any concerning symptoms in the interval. The patient voices understanding of current disease status and treatment options and is in agreement with the current care plan. All questions were answered. The patient knows to call the clinic with any problems, questions or concerns. We can certainly see the patient much sooner if necessary    Orders Placed This Encounter  Procedures  . CBC with Differential (Cancer Center Only)    Standing Status:   Future    Standing Expiration Date:   10/24/2020  . CMP ([facility] only)    Standing Status:   Future    Standing Expiration Date:   10/24/2020     Xara Paulding L Jamayia Croker, PA-C 10/25/19

## 2019-10-25 ENCOUNTER — Encounter: Payer: Self-pay | Admitting: Physician Assistant

## 2019-10-25 ENCOUNTER — Telehealth: Payer: Self-pay | Admitting: Physician Assistant

## 2019-10-25 ENCOUNTER — Other Ambulatory Visit: Payer: Self-pay

## 2019-10-25 ENCOUNTER — Inpatient Hospital Stay: Payer: Medicare HMO

## 2019-10-25 ENCOUNTER — Inpatient Hospital Stay: Payer: Medicare HMO | Attending: Internal Medicine | Admitting: Physician Assistant

## 2019-10-25 VITALS — BP 133/87 | HR 55 | Temp 96.8°F | Resp 16 | Ht 69.0 in | Wt 162.0 lb

## 2019-10-25 DIAGNOSIS — K219 Gastro-esophageal reflux disease without esophagitis: Secondary | ICD-10-CM | POA: Insufficient documentation

## 2019-10-25 DIAGNOSIS — N400 Enlarged prostate without lower urinary tract symptoms: Secondary | ICD-10-CM | POA: Insufficient documentation

## 2019-10-25 DIAGNOSIS — Z8673 Personal history of transient ischemic attack (TIA), and cerebral infarction without residual deficits: Secondary | ICD-10-CM | POA: Diagnosis not present

## 2019-10-25 DIAGNOSIS — Z7982 Long term (current) use of aspirin: Secondary | ICD-10-CM | POA: Insufficient documentation

## 2019-10-25 DIAGNOSIS — R42 Dizziness and giddiness: Secondary | ICD-10-CM | POA: Insufficient documentation

## 2019-10-25 DIAGNOSIS — D45 Polycythemia vera: Secondary | ICD-10-CM

## 2019-10-25 DIAGNOSIS — M109 Gout, unspecified: Secondary | ICD-10-CM | POA: Diagnosis not present

## 2019-10-25 DIAGNOSIS — R61 Generalized hyperhidrosis: Secondary | ICD-10-CM | POA: Insufficient documentation

## 2019-10-25 DIAGNOSIS — D509 Iron deficiency anemia, unspecified: Secondary | ICD-10-CM | POA: Diagnosis not present

## 2019-10-25 DIAGNOSIS — Z79899 Other long term (current) drug therapy: Secondary | ICD-10-CM | POA: Diagnosis not present

## 2019-10-25 DIAGNOSIS — I10 Essential (primary) hypertension: Secondary | ICD-10-CM | POA: Diagnosis not present

## 2019-10-25 DIAGNOSIS — I480 Paroxysmal atrial fibrillation: Secondary | ICD-10-CM | POA: Insufficient documentation

## 2019-10-25 DIAGNOSIS — Z791 Long term (current) use of non-steroidal anti-inflammatories (NSAID): Secondary | ICD-10-CM | POA: Insufficient documentation

## 2019-10-25 DIAGNOSIS — D75839 Thrombocytosis, unspecified: Secondary | ICD-10-CM | POA: Diagnosis not present

## 2019-10-25 DIAGNOSIS — E785 Hyperlipidemia, unspecified: Secondary | ICD-10-CM | POA: Insufficient documentation

## 2019-10-25 LAB — CBC WITH DIFFERENTIAL (CANCER CENTER ONLY)
Abs Immature Granulocytes: 0.02 10*3/uL (ref 0.00–0.07)
Basophils Absolute: 0.1 10*3/uL (ref 0.0–0.1)
Basophils Relative: 1 %
Eosinophils Absolute: 0.2 10*3/uL (ref 0.0–0.5)
Eosinophils Relative: 2 %
HCT: 45.5 % (ref 39.0–52.0)
Hemoglobin: 13.4 g/dL (ref 13.0–17.0)
Immature Granulocytes: 0 %
Lymphocytes Relative: 9 %
Lymphs Abs: 0.9 10*3/uL (ref 0.7–4.0)
MCH: 20.1 pg — ABNORMAL LOW (ref 26.0–34.0)
MCHC: 29.5 g/dL — ABNORMAL LOW (ref 30.0–36.0)
MCV: 68.2 fL — ABNORMAL LOW (ref 80.0–100.0)
Monocytes Absolute: 0.7 10*3/uL (ref 0.1–1.0)
Monocytes Relative: 7 %
Neutro Abs: 7.4 10*3/uL (ref 1.7–7.7)
Neutrophils Relative %: 81 %
Platelet Count: 573 10*3/uL — ABNORMAL HIGH (ref 150–400)
RBC: 6.67 MIL/uL — ABNORMAL HIGH (ref 4.22–5.81)
RDW: 18.6 % — ABNORMAL HIGH (ref 11.5–15.5)
WBC Count: 9.3 10*3/uL (ref 4.0–10.5)
nRBC: 0 % (ref 0.0–0.2)

## 2019-10-25 LAB — CMP (CANCER CENTER ONLY)
ALT: 13 U/L (ref 0–44)
AST: 17 U/L (ref 15–41)
Albumin: 4 g/dL (ref 3.5–5.0)
Alkaline Phosphatase: 74 U/L (ref 38–126)
Anion gap: 7 (ref 5–15)
BUN: 9 mg/dL (ref 8–23)
CO2: 25 mmol/L (ref 22–32)
Calcium: 9.5 mg/dL (ref 8.9–10.3)
Chloride: 109 mmol/L (ref 98–111)
Creatinine: 1.02 mg/dL (ref 0.61–1.24)
GFR, Estimated: >60 mL/min (ref >60)
Glucose, Bld: 98 mg/dL (ref 70–99)
Potassium: 3.9 mmol/L (ref 3.5–5.1)
Sodium: 141 mmol/L (ref 135–145)
Total Bilirubin: 0.6 mg/dL (ref 0.3–1.2)
Total Protein: 6.6 g/dL (ref 6.5–8.1)

## 2019-10-25 NOTE — Telephone Encounter (Signed)
Scheduled appointment per 10/20 los. Spoke to patient who is aware of appointment date and time. Gave patient calendar print out.

## 2019-10-28 NOTE — Telephone Encounter (Signed)
Ok to hold asa 5 daily prior to the procedure.

## 2019-10-30 NOTE — Telephone Encounter (Signed)
   Primary Cardiologist: Dr. [name]  Chart reviewed as part of pre-operative protocol coverage. Patient was recently seen by Oda Kilts, PA-C, on 10/18/2019 at which time he was doing well from a cardiac standpoint. No chest pain, shortness of breath, orthopnea, PND, palpitations, dizziness, or syncope. Given past medical history and time since last visit, based on ACC/AHA guidelines, Dean Pratt would be at acceptable risk for the planned procedure without further cardiovascular testing.    Per Dr. Rayann Heman, OK to hold Aspirin 5 days prior to procedure. This should be resumed as soon as able postoperatively.  I will route this recommendation to the requesting party via Epic fax function and remove from pre-op pool.  Please call with questions.  Darreld Mclean, PA-C 10/30/2019, 11:46 AM

## 2019-12-04 DIAGNOSIS — R42 Dizziness and giddiness: Secondary | ICD-10-CM | POA: Diagnosis not present

## 2019-12-04 DIAGNOSIS — N182 Chronic kidney disease, stage 2 (mild): Secondary | ICD-10-CM | POA: Diagnosis not present

## 2019-12-04 DIAGNOSIS — R413 Other amnesia: Secondary | ICD-10-CM | POA: Diagnosis not present

## 2019-12-04 DIAGNOSIS — E785 Hyperlipidemia, unspecified: Secondary | ICD-10-CM | POA: Diagnosis not present

## 2019-12-04 DIAGNOSIS — D72829 Elevated white blood cell count, unspecified: Secondary | ICD-10-CM | POA: Diagnosis not present

## 2019-12-04 DIAGNOSIS — I129 Hypertensive chronic kidney disease with stage 1 through stage 4 chronic kidney disease, or unspecified chronic kidney disease: Secondary | ICD-10-CM | POA: Diagnosis not present

## 2019-12-04 DIAGNOSIS — I480 Paroxysmal atrial fibrillation: Secondary | ICD-10-CM | POA: Diagnosis not present

## 2019-12-11 DIAGNOSIS — N182 Chronic kidney disease, stage 2 (mild): Secondary | ICD-10-CM | POA: Diagnosis not present

## 2019-12-20 ENCOUNTER — Telehealth: Payer: Self-pay | Admitting: Internal Medicine

## 2019-12-20 ENCOUNTER — Encounter: Payer: Self-pay | Admitting: Internal Medicine

## 2019-12-20 ENCOUNTER — Other Ambulatory Visit: Payer: Self-pay

## 2019-12-20 ENCOUNTER — Inpatient Hospital Stay: Payer: Medicare HMO | Admitting: Internal Medicine

## 2019-12-20 ENCOUNTER — Inpatient Hospital Stay: Payer: Medicare HMO | Attending: Internal Medicine

## 2019-12-20 VITALS — BP 138/68 | HR 61 | Temp 97.7°F | Resp 17 | Ht 69.0 in | Wt 152.7 lb

## 2019-12-20 DIAGNOSIS — K219 Gastro-esophageal reflux disease without esophagitis: Secondary | ICD-10-CM | POA: Diagnosis not present

## 2019-12-20 DIAGNOSIS — Z79899 Other long term (current) drug therapy: Secondary | ICD-10-CM | POA: Insufficient documentation

## 2019-12-20 DIAGNOSIS — D75839 Thrombocytosis, unspecified: Secondary | ICD-10-CM | POA: Insufficient documentation

## 2019-12-20 DIAGNOSIS — R42 Dizziness and giddiness: Secondary | ICD-10-CM | POA: Diagnosis not present

## 2019-12-20 DIAGNOSIS — Z7982 Long term (current) use of aspirin: Secondary | ICD-10-CM | POA: Insufficient documentation

## 2019-12-20 DIAGNOSIS — I10 Essential (primary) hypertension: Secondary | ICD-10-CM | POA: Diagnosis not present

## 2019-12-20 DIAGNOSIS — D45 Polycythemia vera: Secondary | ICD-10-CM | POA: Diagnosis not present

## 2019-12-20 DIAGNOSIS — Z85828 Personal history of other malignant neoplasm of skin: Secondary | ICD-10-CM | POA: Diagnosis not present

## 2019-12-20 DIAGNOSIS — E785 Hyperlipidemia, unspecified: Secondary | ICD-10-CM | POA: Diagnosis not present

## 2019-12-20 LAB — CBC WITH DIFFERENTIAL (CANCER CENTER ONLY)
Abs Immature Granulocytes: 0.04 10*3/uL (ref 0.00–0.07)
Basophils Absolute: 0.1 10*3/uL (ref 0.0–0.1)
Basophils Relative: 1 %
Eosinophils Absolute: 0.2 10*3/uL (ref 0.0–0.5)
Eosinophils Relative: 2 %
HCT: 49.7 % (ref 39.0–52.0)
Hemoglobin: 14.7 g/dL (ref 13.0–17.0)
Immature Granulocytes: 0 %
Lymphocytes Relative: 8 %
Lymphs Abs: 0.9 10*3/uL (ref 0.7–4.0)
MCH: 19.8 pg — ABNORMAL LOW (ref 26.0–34.0)
MCHC: 29.6 g/dL — ABNORMAL LOW (ref 30.0–36.0)
MCV: 67 fL — ABNORMAL LOW (ref 80.0–100.0)
Monocytes Absolute: 0.8 10*3/uL (ref 0.1–1.0)
Monocytes Relative: 8 %
Neutro Abs: 8.6 10*3/uL — ABNORMAL HIGH (ref 1.7–7.7)
Neutrophils Relative %: 81 %
Platelet Count: 679 10*3/uL — ABNORMAL HIGH (ref 150–400)
RBC: 7.42 MIL/uL — ABNORMAL HIGH (ref 4.22–5.81)
RDW: 18.6 % — ABNORMAL HIGH (ref 11.5–15.5)
WBC Count: 10.6 10*3/uL — ABNORMAL HIGH (ref 4.0–10.5)
nRBC: 0 % (ref 0.0–0.2)

## 2019-12-20 LAB — CMP (CANCER CENTER ONLY)
ALT: 19 U/L (ref 0–44)
AST: 20 U/L (ref 15–41)
Albumin: 4.2 g/dL (ref 3.5–5.0)
Alkaline Phosphatase: 74 U/L (ref 38–126)
Anion gap: 6 (ref 5–15)
BUN: 14 mg/dL (ref 8–23)
CO2: 27 mmol/L (ref 22–32)
Calcium: 10.4 mg/dL — ABNORMAL HIGH (ref 8.9–10.3)
Chloride: 111 mmol/L (ref 98–111)
Creatinine: 1.28 mg/dL — ABNORMAL HIGH (ref 0.61–1.24)
GFR, Estimated: 56 mL/min — ABNORMAL LOW (ref >60)
Glucose, Bld: 89 mg/dL (ref 70–99)
Potassium: 4.1 mmol/L (ref 3.5–5.1)
Sodium: 144 mmol/L (ref 135–145)
Total Bilirubin: 0.7 mg/dL (ref 0.3–1.2)
Total Protein: 7.5 g/dL (ref 6.5–8.1)

## 2019-12-20 NOTE — Progress Notes (Signed)
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    PRIOR THERAPY: None  CURRENT THERAPY: Phlebotomy on as-needed basis.  INTERVAL HISTORY: Dean Pratt 84 y.o. male returns to the clinic today for follow-up visit accompanied by his daughter Lenna Sciara.  The patient is feeling fine today with no concerning complaints except for intermittent dizzy spells especially with changing position.  He denied having any current chest pain, shortness of breath, cough or hemoptysis.  He has no nausea, vomiting, diarrhea or constipation.  He denied having any recent weight loss or night sweats.  He has no bleeding issues.  He is here today for evaluation with repeat blood count.    MEDICAL HISTORY: Past Medical History:  Diagnosis Date  . Anxiety   . Atrial flutter ([place])    s/p afib and atrial flutter ablation 04/09/09  . Blood transfusion without reported diagnosis   . BPH (benign prostatic hyperplasia)   . Cancer ([place])    basal cell CA removed  . Cataract    removed with lens implants   . Colitis, ischemic ([place])    secondary to ebolism from afib 1/10  . Constipation   . GERD (gastroesophageal reflux disease)   . Gout   . HTN (hypertension)   . Hyperlipidemia   . Intracranial bleed ([place]) 04/10/10   Right occipital hematoma with a left homonymous hemianopsia   . Migraines   . Paroxysmal atrial fibrillation ([place])    s/p PVI 04/09/09 and 10/17/10  . Rosacea   . Stroke ([place]) 04/08/2010   hemorragic, anticoagulation stopped at that time  . Tuberculosis    s/p treatment 1964    ALLERGIES:  is allergic to ace inhibitors and warfarin and related.  MEDICATIONS:  Current Outpatient Medications  Medication Sig Dispense Refill  . acetaminophen (TYLENOL) 325 MG tablet Take 650 mg by mouth every 6 (six) hours as needed.      Marland Kitchen  allopurinol (ZYLOPRIM) 100 MG tablet Take 100 mg by mouth at bedtime.      . Ascorbic Acid (VITAMIN C) 1000 MG tablet Take 1,000 mg by mouth daily.     Marland Kitchen aspirin 81 MG tablet Take 81 mg by mouth daily.      . diclofenac Sodium (VOLTAREN) 1 % GEL Apply 2-4 g topically 4 (four) times daily. 200 g 3  . diltiazem (CARDIZEM CD) 240 MG 24 hr capsule TAKE 1 CAPSULE DAILY. PLEASE CALL OFFICE TO SCHEDULE APPOINTMENT FOR FURTHER REFILLS 15 capsule 0  . donepezil (ARICEPT) 5 MG tablet Take 5 mg by mouth at bedtime.    . fluticasone (CUTIVATE) 0.05 % cream Apply 1 application topically daily.    Marland Kitchen ibuprofen (ADVIL,MOTRIN) 200 MG tablet Take 200 mg by mouth every 6 (six) hours as needed.    Marland Kitchen LORazepam (ATIVAN) 1 MG tablet TAKE 1/2-1 TABLET TWICE DAILY AS NEEDED  3  . losartan (COZAAR) 25 MG tablet TAKE 1 TABLET BY MOUTH EVERY DAY 90 tablet 2  . methocarbamol (ROBAXIN) 500 MG tablet Take 1 tablet (500 mg total) by mouth every 8 (eight) hours as needed for muscle spasms. 60 tablet 2  . NON FORMULARY Stool softener 100 mg... Take 1 capsule by mouth once daily.     . Olopatadine HCl 0.2 % SOLN INSTILL 1 DROP INTO BOTH EYES TWICE A DAY    .  rosuvastatin (CRESTOR) 10 MG tablet Take 10 mg by mouth daily.  2   Current Facility-Administered Medications  Medication Dose Route Frequency Provider Last Rate Last Admin  . 0.9 %  sodium chloride infusion  500 mL Intravenous Once Irene Shipper, MD      . bupivacaine (MARCAINE) 0.5 % (with pres) injection 3 mL  3 mL Other Once Magnus Sinning, MD        SURGICAL HISTORY:  Past Surgical History:  Procedure Laterality Date  . ablation  04/09/2009   s/p afib and atrial flutter ablation by [facility]  . ABLATION OF DYSRHYTHMIC FOCUS  10/15/09   repeat afib ablation by JA  . APPENDECTOMY    . CATARACT EXTRACTION    . CATARACT EXTRACTION, BILATERAL     with lens implants   . COLONOSCOPY    . CORNEAL LACERATION REPAIR    . EYE SURGERY    . INGUINAL HERNIA REPAIR    .  POLYPECTOMY    . TONSILLECTOMY    . VASECTOMY      REVIEW OF SYSTEMS:  A comprehensive review of systems was negative except for: Constitutional: positive for fatigue Neurological: positive for dizziness   PHYSICAL EXAMINATION: General appearance: alert, cooperative and no distress Head: Normocephalic, without obvious abnormality, atraumatic Neck: no adenopathy, no JVD, supple, symmetrical, trachea midline and thyroid not enlarged, symmetric, no tenderness/mass/nodules Lymph nodes: Cervical, supraclavicular, and axillary nodes normal. Resp: clear to auscultation bilaterally Back: symmetric, no curvature. ROM normal. No CVA tenderness. Cardio: regular rate and rhythm, S1, S2 normal, no murmur, click, rub or gallop GI: soft, non-tender; bowel sounds normal; no masses,  no organomegaly Extremities: extremities normal, atraumatic, no cyanosis or edema  ECOG PERFORMANCE STATUS: 1 - Symptomatic but completely ambulatory  Blood pressure 138/68, pulse 61, temperature 97.7 F (36.5 C), temperature source Tympanic, resp. rate 17, height 5\' 9"  (1.753 m), weight 152 lb 11.2 oz (69.3 kg), SpO2 100 %.  LABORATORY DATA: Lab Results  Component Value Date   WBC 10.6 (H) 12/20/2019   HGB 14.7 12/20/2019   HCT 49.7 12/20/2019   MCV 67.0 (L) 12/20/2019   PLT 679 (H) 12/20/2019      Chemistry      Component Value Date/Time   NA 141 10/25/2019 1029   NA 143 10/18/2019 0836   K 3.9 10/25/2019 1029   CL 109 10/25/2019 1029   CO2 25 10/25/2019 1029   BUN 9 10/25/2019 1029   BUN 12 10/18/2019 0836   CREATININE 1.02 10/25/2019 1029      Component Value Date/Time   CALCIUM 9.5 10/25/2019 1029   ALKPHOS 74 10/25/2019 1029   AST 17 10/25/2019 1029   ALT 13 10/25/2019 1029   BILITOT 0.6 10/25/2019 1029       RADIOGRAPHIC STUDIES: No results found.  ASSESSMENT AND PLAN: This is a very pleasant 84 years old white male with recently diagnosed polycythemia vera with positive Jak 2 mutation.   The patient is also currently on hormonal therapy for androgen deficiency. The patient is currently on phlebotomy on as-needed basis. His hematocrit is 49.7% today. We will proceed with phlebotomy today as planned. I will see the patient back for follow-up visit in 2 months for evaluation with repeat phlebotomy if needed. His thrombocytosis could be secondary to the iron deficiency or part of the myeloproliferative disorder. He was advised to call immediately if he has any other concerning symptoms in the interval. The patient voices understanding of current disease status and  treatment options and is in agreement with the current care plan. All questions were answered. The patient knows to call the clinic with any problems, questions or concerns. We can certainly see the patient much sooner if necessary.  Disclaimer: This note was dictated with voice recognition software. Similar sounding words can inadvertently be transcribed and may not be corrected upon review.

## 2019-12-20 NOTE — Telephone Encounter (Signed)
Scheduled appointments per 12/15 los. Spoke to patient who is aware of appointments dates and times.

## 2019-12-21 ENCOUNTER — Inpatient Hospital Stay: Payer: Medicare HMO

## 2019-12-21 ENCOUNTER — Other Ambulatory Visit: Payer: Self-pay

## 2019-12-21 VITALS — BP 125/66 | HR 53 | Temp 98.0°F | Resp 17

## 2019-12-21 DIAGNOSIS — D45 Polycythemia vera: Secondary | ICD-10-CM | POA: Diagnosis not present

## 2019-12-21 NOTE — Progress Notes (Signed)
Dean Pratt presents today for phlebotomy per MD orders.  Phlebotomy procedure started at 1023 and ended at 1027.  16g phlebotomy kit used in LAC.  544 grams removed. IV needle removed intact.  Patient observed for 30 minutes after procedure without any incident.  Food and drink provided.  Patient tolerated procedure well.  VSS.

## 2019-12-21 NOTE — Patient Instructions (Signed)

## 2020-01-08 ENCOUNTER — Other Ambulatory Visit: Payer: Self-pay | Admitting: Internal Medicine

## 2020-02-20 ENCOUNTER — Other Ambulatory Visit: Payer: Self-pay

## 2020-02-20 ENCOUNTER — Inpatient Hospital Stay (HOSPITAL_BASED_OUTPATIENT_CLINIC_OR_DEPARTMENT_OTHER): Payer: Medicare HMO | Admitting: Internal Medicine

## 2020-02-20 ENCOUNTER — Inpatient Hospital Stay: Payer: Medicare HMO | Attending: Internal Medicine

## 2020-02-20 ENCOUNTER — Inpatient Hospital Stay: Payer: Medicare HMO

## 2020-02-20 VITALS — BP 118/80 | HR 118 | Temp 97.8°F | Resp 16 | Ht 69.0 in | Wt 155.5 lb

## 2020-02-20 DIAGNOSIS — D45 Polycythemia vera: Secondary | ICD-10-CM

## 2020-02-20 DIAGNOSIS — Z79890 Hormone replacement therapy (postmenopausal): Secondary | ICD-10-CM | POA: Insufficient documentation

## 2020-02-20 LAB — CBC WITH DIFFERENTIAL (CANCER CENTER ONLY)
Abs Immature Granulocytes: 0.03 10*3/uL (ref 0.00–0.07)
Basophils Absolute: 0.1 10*3/uL (ref 0.0–0.1)
Basophils Relative: 1 %
Eosinophils Absolute: 0.2 10*3/uL (ref 0.0–0.5)
Eosinophils Relative: 2 %
HCT: 49.6 % (ref 39.0–52.0)
Hemoglobin: 14.6 g/dL (ref 13.0–17.0)
Immature Granulocytes: 0 %
Lymphocytes Relative: 12 %
Lymphs Abs: 1.3 10*3/uL (ref 0.7–4.0)
MCH: 19.9 pg — ABNORMAL LOW (ref 26.0–34.0)
MCHC: 29.4 g/dL — ABNORMAL LOW (ref 30.0–36.0)
MCV: 67.7 fL — ABNORMAL LOW (ref 80.0–100.0)
Monocytes Absolute: 0.8 10*3/uL (ref 0.1–1.0)
Monocytes Relative: 7 %
Neutro Abs: 8.5 10*3/uL — ABNORMAL HIGH (ref 1.7–7.7)
Neutrophils Relative %: 78 %
Platelet Count: 709 10*3/uL — ABNORMAL HIGH (ref 150–400)
RBC: 7.33 MIL/uL — ABNORMAL HIGH (ref 4.22–5.81)
RDW: 18.7 % — ABNORMAL HIGH (ref 11.5–15.5)
WBC Count: 10.9 10*3/uL — ABNORMAL HIGH (ref 4.0–10.5)
nRBC: 0 % (ref 0.0–0.2)

## 2020-02-20 LAB — CMP (CANCER CENTER ONLY)
ALT: 21 U/L (ref 0–44)
AST: 21 U/L (ref 15–41)
Albumin: 4.3 g/dL (ref 3.5–5.0)
Alkaline Phosphatase: 73 U/L (ref 38–126)
Anion gap: 9 (ref 5–15)
BUN: 20 mg/dL (ref 8–23)
CO2: 24 mmol/L (ref 22–32)
Calcium: 9.7 mg/dL (ref 8.9–10.3)
Chloride: 109 mmol/L (ref 98–111)
Creatinine: 1.22 mg/dL (ref 0.61–1.24)
GFR, Estimated: 59 mL/min — ABNORMAL LOW (ref >60)
Glucose, Bld: 73 mg/dL (ref 70–99)
Potassium: 4.1 mmol/L (ref 3.5–5.1)
Sodium: 142 mmol/L (ref 135–145)
Total Bilirubin: 0.6 mg/dL (ref 0.3–1.2)
Total Protein: 6.8 g/dL (ref 6.5–8.1)

## 2020-02-20 LAB — LACTATE DEHYDROGENASE: LDH: 144 U/L (ref 98–192)

## 2020-02-20 MED ORDER — PALONOSETRON HCL INJECTION 0.25 MG/5ML
INTRAVENOUS | Status: AC
  Filled 2020-02-20: qty 5

## 2020-02-20 NOTE — Progress Notes (Signed)
Dean Pratt presents today for phlebotomy per MD orders.  Phlebotomy procedure started at 1149  and ended at 1201.  18g needle and secondary tubing used for phlebotomy in LAC.  535 grams removed. IV needle removed intact.  Patient observed for 30 minutes after procedure without any incident.  Food and drink provided.  Patient tolerated procedure well.  VSS.

## 2020-02-20 NOTE — Progress Notes (Signed)
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    PRIOR THERAPY: None  CURRENT THERAPY: Phlebotomy on as-needed basis.  INTERVAL HISTORY: Dean Pratt 85 y.o. male returns to the clinic today for follow-up visit. The patient is feeling fine today with no concerning complaints. He denied having any current chest pain, shortness of breath, cough or hemoptysis. He denied having any fever or chills. He has no nausea, vomiting, diarrhea or constipation. He has no headache or visual changes. He is here today for evaluation and repeat blood work and phlebotomy if needed.   MEDICAL HISTORY: Past Medical History:  Diagnosis Date  . Anxiety   . Atrial flutter ([place])    s/p afib and atrial flutter ablation 04/09/09  . Blood transfusion without reported diagnosis   . BPH (benign prostatic hyperplasia)   . Cancer ([place])    basal cell CA removed  . Cataract    removed with lens implants   . Colitis, ischemic ([place])    secondary to ebolism from afib 1/10  . Constipation   . GERD (gastroesophageal reflux disease)   . Gout   . HTN (hypertension)   . Hyperlipidemia   . Intracranial bleed ([place]) 04/10/10   Right occipital hematoma with a left homonymous hemianopsia   . Migraines   . Paroxysmal atrial fibrillation ([place])    s/p PVI 04/09/09 and 10/17/10  . Rosacea   . Stroke ([place]) 04/08/2010   hemorragic, anticoagulation stopped at that time  . Tuberculosis    s/p treatment 1964    ALLERGIES:  is allergic to ace inhibitors and warfarin and related.  MEDICATIONS:  Current Outpatient Medications  Medication Sig Dispense Refill  . acetaminophen (TYLENOL) 325 MG tablet Take 650 mg by mouth every 6 (six) hours as needed.      Marland Kitchen allopurinol (ZYLOPRIM) 100 MG tablet Take 100 mg by mouth at bedtime.      . Ascorbic Acid (VITAMIN C)  1000 MG tablet Take 1,000 mg by mouth daily.     Marland Kitchen aspirin 81 MG tablet Take 81 mg by mouth daily.      . diclofenac Sodium (VOLTAREN) 1 % GEL Apply 2-4 g topically 4 (four) times daily. 200 g 3  . diltiazem (CARDIZEM CD) 240 MG 24 hr capsule TAKE 1 CAPSULE DAILY. PLEASE CALL OFFICE TO SCHEDULE APPOINTMENT FOR FURTHER REFILLS 15 capsule 0  . donepezil (ARICEPT) 5 MG tablet Take 5 mg by mouth at bedtime.    . fluticasone (CUTIVATE) 0.05 % cream Apply 1 application topically daily.    Marland Kitchen ibuprofen (ADVIL,MOTRIN) 200 MG tablet Take 200 mg by mouth every 6 (six) hours as needed.    Marland Kitchen LORazepam (ATIVAN) 1 MG tablet TAKE 1/2-1 TABLET TWICE DAILY AS NEEDED  3  . losartan (COZAAR) 25 MG tablet TAKE 1 TABLET BY MOUTH EVERY DAY 90 tablet 2  . methocarbamol (ROBAXIN) 500 MG tablet Take 1 tablet (500 mg total) by mouth every 8 (eight) hours as needed for muscle spasms. 60 tablet 2  . NON FORMULARY Stool softener 100 mg... Take 1 capsule by mouth once daily.     . Olopatadine HCl 0.2 % SOLN INSTILL 1 DROP INTO BOTH EYES TWICE A DAY    . rosuvastatin (CRESTOR) 10 MG tablet Take 10 mg by mouth daily.  2   Current  Facility-Administered Medications  Medication Dose Route Frequency Provider Last Rate Last Admin  . 0.9 %  sodium chloride infusion  500 mL Intravenous Once Irene Shipper, MD      . bupivacaine (MARCAINE) 0.5 % (with pres) injection 3 mL  3 mL Other Once Magnus Sinning, MD        SURGICAL HISTORY:  Past Surgical History:  Procedure Laterality Date  . ablation  04/09/2009   s/p afib and atrial flutter ablation by [facility]  . ABLATION OF DYSRHYTHMIC FOCUS  10/15/09   repeat afib ablation by [facility]  . APPENDECTOMY    . CATARACT EXTRACTION    . CATARACT EXTRACTION, BILATERAL     with lens implants   . COLONOSCOPY    . CORNEAL LACERATION REPAIR    . EYE SURGERY    . INGUINAL HERNIA REPAIR    . POLYPECTOMY    . TONSILLECTOMY    . VASECTOMY      REVIEW OF SYSTEMS:  A comprehensive review of systems  was negative.   PHYSICAL EXAMINATION: General appearance: alert, cooperative and no distress Head: Normocephalic, without obvious abnormality, atraumatic Neck: no adenopathy, no JVD, supple, symmetrical, trachea midline and thyroid not enlarged, symmetric, no tenderness/mass/nodules Lymph nodes: Cervical, supraclavicular, and axillary nodes normal. Resp: clear to auscultation bilaterally Back: symmetric, no curvature. ROM normal. No CVA tenderness. Cardio: regular rate and rhythm, S1, S2 normal, no murmur, click, rub or gallop GI: soft, non-tender; bowel sounds normal; no masses,  no organomegaly Extremities: extremities normal, atraumatic, no cyanosis or edema  ECOG PERFORMANCE STATUS: 1 - Symptomatic but completely ambulatory  Blood pressure 118/80, pulse (!) 118, temperature 97.8 F (36.6 C), temperature source Tympanic, resp. rate 16, height 5\' 9"  (1.753 m), weight 155 lb 8 oz (70.5 kg), SpO2 97 %.  LABORATORY DATA: Lab Results  Component Value Date   WBC 10.9 (H) 02/20/2020   HGB 14.6 02/20/2020   HCT 49.6 02/20/2020   MCV 67.7 (L) 02/20/2020   PLT 709 (H) 02/20/2020      Chemistry      Component Value Date/Time   NA 142 02/20/2020 1008   NA 143 10/18/2019 0836   K 4.1 02/20/2020 1008   CL 109 02/20/2020 1008   CO2 24 02/20/2020 1008   BUN 20 02/20/2020 1008   BUN 12 10/18/2019 0836   CREATININE 1.22 02/20/2020 1008      Component Value Date/Time   CALCIUM 9.7 02/20/2020 1008   ALKPHOS 73 02/20/2020 1008   AST 21 02/20/2020 1008   ALT 21 02/20/2020 1008   BILITOT 0.6 02/20/2020 1008       RADIOGRAPHIC STUDIES: No results found.  ASSESSMENT AND PLAN: This is a very pleasant 85 years old white male with recently diagnosed polycythemia vera with positive Jak 2 mutation.  The patient is also currently on hormonal therapy for androgen deficiency. The patient is feeling fine today with no concerning complaints. CBC today showed hematocrit of 49.6%. I recommended  for the patient to proceed with phlebotomy today as planned. I will see him back for follow-up visit in 6 weeks for evaluation with repeat blood work and phlebotomy if needed. He was advised to call immediately if he has any concerning symptoms in the interval. The patient voices understanding of current disease status and treatment options and is in agreement with the current care plan. All questions were answered. The patient knows to call the clinic with any problems, questions or concerns. We can certainly see the  patient much sooner if necessary.  Disclaimer: This note was dictated with voice recognition software. Similar sounding words can inadvertently be transcribed and may not be corrected upon review.

## 2020-02-20 NOTE — Patient Instructions (Signed)
Therapeutic Phlebotomy Therapeutic phlebotomy is the planned removal of blood from a person's body for the purpose of treating a medical condition. The procedure is similar to donating blood. Usually, about a pint (470 mL, or 0.47 L) of blood is removed. The average adult has 9-12 pints (4.3-5.7 L) of blood in the body. Therapeutic phlebotomy may be used to treat the following medical conditions:  Hemochromatosis. This is a condition in which the blood contains too much iron.  Polycythemia vera. This is a condition in which the blood contains too many red blood cells.  Porphyria cutanea tarda. This is a disease in which an important part of hemoglobin is not made properly. It results in the buildup of abnormal amounts of porphyrins in the body.  Sickle cell disease. This is a condition in which the red blood cells form an abnormal crescent shape rather than a round shape. Tell a health care provider about:  Any allergies you have.  All medicines you are taking, including vitamins, herbs, eye drops, creams, and over-the-counter medicines.  Any problems you or family members have had with anesthetic medicines.  Any blood disorders you have.  Any surgeries you have had.  Any medical conditions you have.  Whether you are pregnant or may be pregnant. What are the risks? Generally, this is a safe procedure. However, problems may occur, including:  Nausea or light-headedness.  Low blood pressure (hypotension).  Soreness, bleeding, swelling, or bruising at the needle insertion site.  Infection. What happens before the procedure?  Follow instructions from your health care provider about eating or drinking restrictions.  Ask your health care provider about: ? Changing or stopping your regular medicines. This is especially important if you are taking diabetes medicines or blood thinners (anticoagulants). ? Taking medicines such as aspirin and ibuprofen. These medicines can thin your  blood. Do not take these medicines unless your health care provider tells you to take them. ? Taking over-the-counter medicines, vitamins, herbs, and supplements.  Wear clothing with sleeves that can be raised above the elbow.  Plan to have someone take you home from the hospital or clinic.  You may have a blood sample taken.  Your blood pressure, pulse rate, and breathing rate will be measured. What happens during the procedure?  To lower your risk of infection: ? Your health care team will wash or sanitize their hands. ? Your skin will be cleaned with an antiseptic.  You may be given a medicine to numb the area (local anesthetic).  A tourniquet will be placed on your arm.  A needle will be inserted into one of your veins.  Tubing and a collection bag will be attached to that needle.  Blood will flow through the needle and tubing into the collection bag.  The collection bag will be placed lower than your arm to allow gravity to help the flow of blood into the bag.  You may be asked to open and close your hand slowly and continually during the entire collection.  After the specified amount of blood has been removed from your body, the collection bag and tubing will be clamped.  The needle will be removed from your vein.  Pressure will be held on the site of the needle insertion to stop the bleeding.  A bandage (dressing) will be placed over the needle insertion site. The procedure may vary among health care providers and hospitals.   What happens after the procedure?  Your blood pressure, pulse rate, and breathing rate will   be measured after the procedure.  You will be encouraged to drink fluids.  Your recovery will be assessed and monitored.  You can return to your normal activities as told by your health care provider. Summary  Therapeutic phlebotomy is the planned removal of blood from a person's body for the purpose of treating a medical condition.  Therapeutic  phlebotomy may be used to treat hemochromatosis, polycythemia vera, porphyria cutanea tarda, or sickle cell disease.  In the procedure, a needle is inserted and about a pint (470 mL, or 0.47 L) of blood is removed. The average adult has 9-12 pints (4.3-5.7 L) of blood in the body.  This is generally a safe procedure, but it can sometimes cause problems such as nausea, light-headedness, or low blood pressure (hypotension). This information is not intended to replace advice given to you by your health care provider. Make sure you discuss any questions you have with your health care provider. Document Revised: 01/07/2017 Document Reviewed: 01/07/2017 Elsevier Patient Education  2021 Elsevier Inc.  

## 2020-02-21 ENCOUNTER — Telehealth: Payer: Self-pay | Admitting: Internal Medicine

## 2020-02-21 NOTE — Telephone Encounter (Signed)
Scheduled appts per 2/15 los. Left voicemail with appt date and time.  

## 2020-03-18 DIAGNOSIS — E291 Testicular hypofunction: Secondary | ICD-10-CM | POA: Diagnosis not present

## 2020-03-19 DIAGNOSIS — E039 Hypothyroidism, unspecified: Secondary | ICD-10-CM | POA: Diagnosis not present

## 2020-03-19 DIAGNOSIS — Z125 Encounter for screening for malignant neoplasm of prostate: Secondary | ICD-10-CM | POA: Diagnosis not present

## 2020-03-19 DIAGNOSIS — M109 Gout, unspecified: Secondary | ICD-10-CM | POA: Diagnosis not present

## 2020-03-19 DIAGNOSIS — E785 Hyperlipidemia, unspecified: Secondary | ICD-10-CM | POA: Diagnosis not present

## 2020-03-25 DIAGNOSIS — N182 Chronic kidney disease, stage 2 (mild): Secondary | ICD-10-CM | POA: Diagnosis not present

## 2020-03-25 DIAGNOSIS — Z1339 Encounter for screening examination for other mental health and behavioral disorders: Secondary | ICD-10-CM | POA: Diagnosis not present

## 2020-03-25 DIAGNOSIS — I129 Hypertensive chronic kidney disease with stage 1 through stage 4 chronic kidney disease, or unspecified chronic kidney disease: Secondary | ICD-10-CM | POA: Diagnosis not present

## 2020-03-25 DIAGNOSIS — D751 Secondary polycythemia: Secondary | ICD-10-CM | POA: Diagnosis not present

## 2020-03-25 DIAGNOSIS — R69 Illness, unspecified: Secondary | ICD-10-CM | POA: Diagnosis not present

## 2020-03-25 DIAGNOSIS — E785 Hyperlipidemia, unspecified: Secondary | ICD-10-CM | POA: Diagnosis not present

## 2020-03-25 DIAGNOSIS — K921 Melena: Secondary | ICD-10-CM | POA: Diagnosis not present

## 2020-03-25 DIAGNOSIS — D45 Polycythemia vera: Secondary | ICD-10-CM | POA: Diagnosis not present

## 2020-03-25 DIAGNOSIS — I480 Paroxysmal atrial fibrillation: Secondary | ICD-10-CM | POA: Diagnosis not present

## 2020-03-25 DIAGNOSIS — Z1331 Encounter for screening for depression: Secondary | ICD-10-CM | POA: Diagnosis not present

## 2020-03-25 DIAGNOSIS — D692 Other nonthrombocytopenic purpura: Secondary | ICD-10-CM | POA: Diagnosis not present

## 2020-03-25 DIAGNOSIS — Z0000 Encounter for general adult medical examination without abnormal findings: Secondary | ICD-10-CM | POA: Diagnosis not present

## 2020-04-03 ENCOUNTER — Inpatient Hospital Stay: Payer: Medicare HMO

## 2020-04-03 ENCOUNTER — Inpatient Hospital Stay: Payer: Medicare HMO | Attending: Internal Medicine | Admitting: Internal Medicine

## 2020-04-03 ENCOUNTER — Other Ambulatory Visit: Payer: Self-pay

## 2020-04-03 VITALS — BP 143/91 | HR 87 | Temp 97.3°F | Resp 17 | Ht 69.0 in | Wt 156.3 lb

## 2020-04-03 DIAGNOSIS — Z79899 Other long term (current) drug therapy: Secondary | ICD-10-CM | POA: Insufficient documentation

## 2020-04-03 DIAGNOSIS — K219 Gastro-esophageal reflux disease without esophagitis: Secondary | ICD-10-CM | POA: Insufficient documentation

## 2020-04-03 DIAGNOSIS — I480 Paroxysmal atrial fibrillation: Secondary | ICD-10-CM | POA: Insufficient documentation

## 2020-04-03 DIAGNOSIS — E785 Hyperlipidemia, unspecified: Secondary | ICD-10-CM | POA: Insufficient documentation

## 2020-04-03 DIAGNOSIS — D45 Polycythemia vera: Secondary | ICD-10-CM

## 2020-04-03 DIAGNOSIS — R69 Illness, unspecified: Secondary | ICD-10-CM | POA: Diagnosis not present

## 2020-04-03 DIAGNOSIS — Z7982 Long term (current) use of aspirin: Secondary | ICD-10-CM | POA: Diagnosis not present

## 2020-04-03 DIAGNOSIS — Z8673 Personal history of transient ischemic attack (TIA), and cerebral infarction without residual deficits: Secondary | ICD-10-CM | POA: Diagnosis not present

## 2020-04-03 DIAGNOSIS — I10 Essential (primary) hypertension: Secondary | ICD-10-CM | POA: Diagnosis not present

## 2020-04-03 DIAGNOSIS — G8929 Other chronic pain: Secondary | ICD-10-CM | POA: Insufficient documentation

## 2020-04-03 DIAGNOSIS — M109 Gout, unspecified: Secondary | ICD-10-CM | POA: Diagnosis not present

## 2020-04-03 DIAGNOSIS — F419 Anxiety disorder, unspecified: Secondary | ICD-10-CM | POA: Diagnosis not present

## 2020-04-03 DIAGNOSIS — M549 Dorsalgia, unspecified: Secondary | ICD-10-CM | POA: Diagnosis not present

## 2020-04-03 LAB — CMP (CANCER CENTER ONLY)
ALT: 18 U/L (ref 0–44)
AST: 22 U/L (ref 15–41)
Albumin: 4.3 g/dL (ref 3.5–5.0)
Alkaline Phosphatase: 78 U/L (ref 38–126)
Anion gap: 10 (ref 5–15)
BUN: 15 mg/dL (ref 8–23)
CO2: 26 mmol/L (ref 22–32)
Calcium: 9.5 mg/dL (ref 8.9–10.3)
Chloride: 106 mmol/L (ref 98–111)
Creatinine: 1.16 mg/dL (ref 0.61–1.24)
GFR, Estimated: >60 mL/min (ref >60)
Glucose, Bld: 91 mg/dL (ref 70–99)
Potassium: 4.7 mmol/L (ref 3.5–5.1)
Sodium: 142 mmol/L (ref 135–145)
Total Bilirubin: 0.6 mg/dL (ref 0.3–1.2)
Total Protein: 7.1 g/dL (ref 6.5–8.1)

## 2020-04-03 LAB — CBC WITH DIFFERENTIAL (CANCER CENTER ONLY)
Abs Immature Granulocytes: 0.03 10*3/uL (ref 0.00–0.07)
Basophils Absolute: 0.1 10*3/uL (ref 0.0–0.1)
Basophils Relative: 1 %
Eosinophils Absolute: 0.2 10*3/uL (ref 0.0–0.5)
Eosinophils Relative: 2 %
HCT: 48.5 % (ref 39.0–52.0)
Hemoglobin: 14.1 g/dL (ref 13.0–17.0)
Immature Granulocytes: 0 %
Lymphocytes Relative: 12 %
Lymphs Abs: 1.3 10*3/uL (ref 0.7–4.0)
MCH: 19.4 pg — ABNORMAL LOW (ref 26.0–34.0)
MCHC: 29.1 g/dL — ABNORMAL LOW (ref 30.0–36.0)
MCV: 66.8 fL — ABNORMAL LOW (ref 80.0–100.0)
Monocytes Absolute: 0.8 10*3/uL (ref 0.1–1.0)
Monocytes Relative: 8 %
Neutro Abs: 7.7 10*3/uL (ref 1.7–7.7)
Neutrophils Relative %: 77 %
Platelet Count: 698 10*3/uL — ABNORMAL HIGH (ref 150–400)
RBC: 7.26 MIL/uL — ABNORMAL HIGH (ref 4.22–5.81)
RDW: 18.6 % — ABNORMAL HIGH (ref 11.5–15.5)
WBC Count: 10.1 10*3/uL (ref 4.0–10.5)
nRBC: 0 % (ref 0.0–0.2)

## 2020-04-03 LAB — LACTATE DEHYDROGENASE: LDH: 153 U/L (ref 98–192)

## 2020-04-03 NOTE — Progress Notes (Signed)
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    PRIOR THERAPY: None  CURRENT THERAPY: Phlebotomy on as-needed basis.  INTERVAL HISTORY: Dean Pratt 85 y.o. male returns to the clinic today for follow-up visit.  The patient is feeling fine today with no concerning complaints except for chronic back pain and he is currently on Robaxin.  He denied having any chest pain, shortness of breath, cough or hemoptysis.  He denied having any fever or chills.  He has no nausea, vomiting, diarrhea or constipation.  He has no headache or visual changes.  He has no bleeding, bruises or ecchymosis.  He is here today for evaluation and repeat blood work.    MEDICAL HISTORY: Past Medical History:  Diagnosis Date  . Anxiety   . Atrial flutter ([place])    s/p afib and atrial flutter ablation 04/09/09  . Blood transfusion without reported diagnosis   . BPH (benign prostatic hyperplasia)   . Cancer ([place])    basal cell CA removed  . Cataract    removed with lens implants   . Colitis, ischemic ([place])    secondary to ebolism from afib 1/10  . Constipation   . GERD (gastroesophageal reflux disease)   . Gout   . HTN (hypertension)   . Hyperlipidemia   . Intracranial bleed ([facility]) 04/10/10   Right occipital hematoma with a left homonymous hemianopsia   . Migraines   . Paroxysmal atrial fibrillation ([place])    s/p PVI 04/09/09 and 10/17/10  . Rosacea   . Stroke ([place]) 04/08/2010   hemorragic, anticoagulation stopped at that time  . Tuberculosis    s/p treatment 1964    ALLERGIES:  is allergic to ace inhibitors and warfarin and related.  MEDICATIONS:  Current Outpatient Medications  Medication Sig Dispense Refill  . acetaminophen (TYLENOL) 325 MG tablet Take 650 mg by mouth every 6 (six) hours as needed.      Marland Kitchen allopurinol (ZYLOPRIM) 100  MG tablet Take 100 mg by mouth at bedtime.      . Ascorbic Acid (VITAMIN C) 1000 MG tablet Take 1,000 mg by mouth daily.     Marland Kitchen aspirin 81 MG tablet Take 81 mg by mouth daily.      . diclofenac Sodium (VOLTAREN) 1 % GEL Apply 2-4 g topically 4 (four) times daily. 200 g 3  . diltiazem (CARDIZEM CD) 240 MG 24 hr capsule TAKE 1 CAPSULE DAILY. PLEASE CALL OFFICE TO SCHEDULE APPOINTMENT FOR FURTHER REFILLS 15 capsule 0  . donepezil (ARICEPT) 5 MG tablet Take 5 mg by mouth at bedtime.    . fluticasone (CUTIVATE) 0.05 % cream Apply 1 application topically daily.    Marland Kitchen ibuprofen (ADVIL,MOTRIN) 200 MG tablet Take 200 mg by mouth every 6 (six) hours as needed.    Marland Kitchen LORazepam (ATIVAN) 1 MG tablet TAKE 1/2-1 TABLET TWICE DAILY AS NEEDED  3  . losartan (COZAAR) 25 MG tablet TAKE 1 TABLET BY MOUTH EVERY DAY 90 tablet 2  . methocarbamol (ROBAXIN) 500 MG tablet Take 1 tablet (500 mg total) by mouth every 8 (eight) hours as needed for muscle spasms. 60 tablet 2  . NON FORMULARY Stool softener 100 mg... Take 1 capsule by mouth once daily.     . Olopatadine HCl 0.2 % SOLN INSTILL 1 DROP INTO BOTH EYES TWICE A  DAY    . rosuvastatin (CRESTOR) 10 MG tablet Take 10 mg by mouth daily.  2   Current Facility-Administered Medications  Medication Dose Route Frequency Provider Last Rate Last Admin  . 0.9 %  sodium chloride infusion  500 mL Intravenous Once Irene Shipper, MD      . bupivacaine (MARCAINE) 0.5 % (with pres) injection 3 mL  3 mL Other Once Magnus Sinning, MD        SURGICAL HISTORY:  Past Surgical History:  Procedure Laterality Date  . ablation  04/09/2009   s/p afib and atrial flutter ablation by [facility]  . ABLATION OF DYSRHYTHMIC FOCUS  10/15/09   repeat afib ablation by [facility]  . APPENDECTOMY    . CATARACT EXTRACTION    . CATARACT EXTRACTION, BILATERAL     with lens implants   . COLONOSCOPY    . CORNEAL LACERATION REPAIR    . EYE SURGERY    . INGUINAL HERNIA REPAIR    . POLYPECTOMY    . TONSILLECTOMY     . VASECTOMY      REVIEW OF SYSTEMS:  A comprehensive review of systems was negative except for: Musculoskeletal: positive for back pain   PHYSICAL EXAMINATION: General appearance: alert, cooperative and no distress Head: Normocephalic, without obvious abnormality, atraumatic Neck: no adenopathy, no JVD, supple, symmetrical, trachea midline and thyroid not enlarged, symmetric, no tenderness/mass/nodules Lymph nodes: Cervical, supraclavicular, and axillary nodes normal. Resp: clear to auscultation bilaterally Back: symmetric, no curvature. ROM normal. No CVA tenderness. Cardio: regular rate and rhythm, S1, S2 normal, no murmur, click, rub or gallop GI: soft, non-tender; bowel sounds normal; no masses,  no organomegaly Extremities: extremities normal, atraumatic, no cyanosis or edema  ECOG PERFORMANCE STATUS: 1 - Symptomatic but completely ambulatory  Blood pressure (!) 143/91, pulse 87, temperature (!) 97.3 F (36.3 C), temperature source Tympanic, resp. rate 17, height 5\' 9"  (1.753 m), weight 156 lb 4.8 oz (70.9 kg), SpO2 98 %.  LABORATORY DATA: Lab Results  Component Value Date   WBC 10.1 04/03/2020   HGB 14.1 04/03/2020   HCT 48.5 04/03/2020   MCV 66.8 (L) 04/03/2020   PLT 698 (H) 04/03/2020      Chemistry      Component Value Date/Time   NA 142 02/20/2020 1008   NA 143 10/18/2019 0836   K 4.1 02/20/2020 1008   CL 109 02/20/2020 1008   CO2 24 02/20/2020 1008   BUN 20 02/20/2020 1008   BUN 12 10/18/2019 0836   CREATININE 1.22 02/20/2020 1008      Component Value Date/Time   CALCIUM 9.7 02/20/2020 1008   ALKPHOS 73 02/20/2020 1008   AST 21 02/20/2020 1008   ALT 21 02/20/2020 1008   BILITOT 0.6 02/20/2020 1008       RADIOGRAPHIC STUDIES: No results found.  ASSESSMENT AND PLAN: This is a very pleasant 85 years old white male with recently diagnosed polycythemia vera with positive Jak 2 mutation.  The patient is also currently on hormonal therapy for androgen  deficiency. The patient is currently on observation and he is feeling fine with no concerning complaints except for the chronic back pain. Repeat CBC today showed hemoglobin of 14.1 and hematocrit of 48.5%. He also has elevated platelets count secondary to the iron deficiency. I recommended for the patient to continue on observation with repeat CBC, comprehensive metabolic panel and LDH in 2 months. I will not proceed with a phlebotomy today because of the significant microcytosis and iron  deficiency. He was advised to call immediately if he has any other concerning symptoms in the interval. The patient voices understanding of current disease status and treatment options and is in agreement with the current care plan. All questions were answered. The patient knows to call the clinic with any problems, questions or concerns. We can certainly see the patient much sooner if necessary.  Disclaimer: This note was dictated with voice recognition software. Similar sounding words can inadvertently be transcribed and may not be corrected upon review.

## 2020-04-08 ENCOUNTER — Telehealth: Payer: Self-pay | Admitting: Internal Medicine

## 2020-04-08 NOTE — Telephone Encounter (Signed)
Scheduled per los. Called and spoke with patient. Confirmed appt 

## 2020-05-08 DIAGNOSIS — H33011 Retinal detachment with single break, right eye: Secondary | ICD-10-CM | POA: Diagnosis not present

## 2020-05-08 DIAGNOSIS — H1045 Other chronic allergic conjunctivitis: Secondary | ICD-10-CM | POA: Diagnosis not present

## 2020-05-08 DIAGNOSIS — K921 Melena: Secondary | ICD-10-CM | POA: Diagnosis not present

## 2020-05-08 DIAGNOSIS — H04123 Dry eye syndrome of bilateral lacrimal glands: Secondary | ICD-10-CM | POA: Diagnosis not present

## 2020-05-08 DIAGNOSIS — Z961 Presence of intraocular lens: Secondary | ICD-10-CM | POA: Diagnosis not present

## 2020-05-08 DIAGNOSIS — L719 Rosacea, unspecified: Secondary | ICD-10-CM | POA: Diagnosis not present

## 2020-06-05 ENCOUNTER — Inpatient Hospital Stay: Payer: Medicare HMO | Attending: Internal Medicine | Admitting: Internal Medicine

## 2020-06-05 ENCOUNTER — Other Ambulatory Visit: Payer: Self-pay

## 2020-06-05 ENCOUNTER — Telehealth: Payer: Self-pay | Admitting: Internal Medicine

## 2020-06-05 ENCOUNTER — Encounter: Payer: Self-pay | Admitting: Internal Medicine

## 2020-06-05 ENCOUNTER — Inpatient Hospital Stay: Payer: Medicare HMO

## 2020-06-05 VITALS — BP 125/75 | HR 87 | Temp 97.1°F | Resp 18 | Ht 69.0 in | Wt 159.9 lb

## 2020-06-05 DIAGNOSIS — N400 Enlarged prostate without lower urinary tract symptoms: Secondary | ICD-10-CM | POA: Insufficient documentation

## 2020-06-05 DIAGNOSIS — Z85828 Personal history of other malignant neoplasm of skin: Secondary | ICD-10-CM | POA: Diagnosis not present

## 2020-06-05 DIAGNOSIS — Z8673 Personal history of transient ischemic attack (TIA), and cerebral infarction without residual deficits: Secondary | ICD-10-CM | POA: Insufficient documentation

## 2020-06-05 DIAGNOSIS — Z7982 Long term (current) use of aspirin: Secondary | ICD-10-CM | POA: Insufficient documentation

## 2020-06-05 DIAGNOSIS — I480 Paroxysmal atrial fibrillation: Secondary | ICD-10-CM | POA: Insufficient documentation

## 2020-06-05 DIAGNOSIS — D45 Polycythemia vera: Secondary | ICD-10-CM

## 2020-06-05 DIAGNOSIS — E785 Hyperlipidemia, unspecified: Secondary | ICD-10-CM | POA: Diagnosis not present

## 2020-06-05 DIAGNOSIS — I10 Essential (primary) hypertension: Secondary | ICD-10-CM

## 2020-06-05 DIAGNOSIS — D75838 Other thrombocytosis: Secondary | ICD-10-CM | POA: Diagnosis not present

## 2020-06-05 DIAGNOSIS — Z79899 Other long term (current) drug therapy: Secondary | ICD-10-CM | POA: Insufficient documentation

## 2020-06-05 LAB — CBC WITH DIFFERENTIAL (CANCER CENTER ONLY)
Abs Immature Granulocytes: 0.04 10*3/uL (ref 0.00–0.07)
Basophils Absolute: 0.2 10*3/uL — ABNORMAL HIGH (ref 0.0–0.1)
Basophils Relative: 1 %
Eosinophils Absolute: 0.3 10*3/uL (ref 0.0–0.5)
Eosinophils Relative: 3 %
HCT: 46.9 % (ref 39.0–52.0)
Hemoglobin: 13.8 g/dL (ref 13.0–17.0)
Immature Granulocytes: 0 %
Lymphocytes Relative: 13 %
Lymphs Abs: 1.3 10*3/uL (ref 0.7–4.0)
MCH: 19.4 pg — ABNORMAL LOW (ref 26.0–34.0)
MCHC: 29.4 g/dL — ABNORMAL LOW (ref 30.0–36.0)
MCV: 66.1 fL — ABNORMAL LOW (ref 80.0–100.0)
Monocytes Absolute: 0.7 10*3/uL (ref 0.1–1.0)
Monocytes Relative: 7 %
Neutro Abs: 8.1 10*3/uL — ABNORMAL HIGH (ref 1.7–7.7)
Neutrophils Relative %: 76 %
Platelet Count: 706 10*3/uL — ABNORMAL HIGH (ref 150–400)
RBC: 7.1 MIL/uL — ABNORMAL HIGH (ref 4.22–5.81)
RDW: 19.6 % — ABNORMAL HIGH (ref 11.5–15.5)
WBC Count: 10.7 10*3/uL — ABNORMAL HIGH (ref 4.0–10.5)
nRBC: 0 % (ref 0.0–0.2)

## 2020-06-05 LAB — LACTATE DEHYDROGENASE: LDH: 140 U/L (ref 98–192)

## 2020-06-05 LAB — CMP (CANCER CENTER ONLY)
ALT: 14 U/L (ref 0–44)
AST: 19 U/L (ref 15–41)
Albumin: 4.1 g/dL (ref 3.5–5.0)
Alkaline Phosphatase: 71 U/L (ref 38–126)
Anion gap: 10 (ref 5–15)
BUN: 17 mg/dL (ref 8–23)
CO2: 23 mmol/L (ref 22–32)
Calcium: 9.7 mg/dL (ref 8.9–10.3)
Chloride: 106 mmol/L (ref 98–111)
Creatinine: 1.13 mg/dL (ref 0.61–1.24)
GFR, Estimated: >60 mL/min (ref >60)
Glucose, Bld: 92 mg/dL (ref 70–99)
Potassium: 4 mmol/L (ref 3.5–5.1)
Sodium: 139 mmol/L (ref 135–145)
Total Bilirubin: 0.6 mg/dL (ref 0.3–1.2)
Total Protein: 6.9 g/dL (ref 6.5–8.1)

## 2020-06-05 MED ORDER — HYDROXYUREA 500 MG PO CAPS: 500.0000 mg | ORAL_CAPSULE | Freq: Every day | ORAL | 2 refills | Status: DC

## 2020-06-05 NOTE — Telephone Encounter (Signed)
Scheduled per los. Gave avs and calendar  

## 2020-06-05 NOTE — Progress Notes (Signed)
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    PRIOR THERAPY: Phlebotomy on as-needed basis  CURRENT THERAPY: Hydroxyurea 500 mg p.o. daily.  INTERVAL HISTORY: Dean Pratt 85 y.o. male returns to the clinic today for 31-month follow-up visit.  The patient is feeling fine today with no concerning complaints except for occasional epistaxis.  He was also found recently to have positive Hemoccult stool and he was referred to his gastroenterologist Dr. Gwenlyn Found for evaluation but has not seen him yet.  He denied having any current chest pain, shortness of breath, cough or hemoptysis.  He denied having any fever or chills.  He has no nausea, vomiting, diarrhea or constipation.  He has no headache or visual changes.  He is here today for evaluation and repeat blood work.     MEDICAL HISTORY: Past Medical History:  Diagnosis Date  . Anxiety   . Atrial flutter ([facility])    s/p afib and atrial flutter ablation 04/09/09  . Blood transfusion without reported diagnosis   . BPH (benign prostatic hyperplasia)   . Cancer ([facility])    basal cell CA removed  . Cataract    removed with lens implants   . Colitis, ischemic ([facility])    secondary to ebolism from afib 1/10  . Constipation   . GERD (gastroesophageal reflux disease)   . Gout   . HTN (hypertension)   . Hyperlipidemia   . Intracranial bleed ([facility]) 04/10/10   Right occipital hematoma with a left homonymous hemianopsia   . Migraines   . Paroxysmal atrial fibrillation ([facility])    s/p PVI 04/09/09 and 10/17/10  . Rosacea   . Stroke ([facility]) 04/08/2010   hemorragic, anticoagulation stopped at that time  . Tuberculosis    s/p treatment 1964    ALLERGIES:  is allergic to ace inhibitors and warfarin and related.  MEDICATIONS:  Current Outpatient Medications  Medication Sig Dispense Refill   . acetaminophen (TYLENOL) 325 MG tablet Take 650 mg by mouth every 6 (six) hours as needed.      Marland Kitchen allopurinol (ZYLOPRIM) 100 MG tablet Take 100 mg by mouth at bedtime.      . Ascorbic Acid (VITAMIN C) 1000 MG tablet Take 1,000 mg by mouth daily.     Marland Kitchen aspirin 81 MG tablet Take 81 mg by mouth daily.      . diclofenac Sodium (VOLTAREN) 1 % GEL Apply 2-4 g topically 4 (four) times daily. 200 g 3  . diltiazem (CARDIZEM CD) 240 MG 24 hr capsule TAKE 1 CAPSULE DAILY. PLEASE CALL OFFICE TO SCHEDULE APPOINTMENT FOR FURTHER REFILLS 15 capsule 0  . donepezil (ARICEPT) 5 MG tablet Take 5 mg by mouth at bedtime.    . fluticasone (CUTIVATE) 0.05 % cream Apply 1 application topically daily.    Marland Kitchen ibuprofen (ADVIL,MOTRIN) 200 MG tablet Take 200 mg by mouth every 6 (six) hours as needed.    Marland Kitchen LORazepam (ATIVAN) 1 MG tablet TAKE 1/2-1 TABLET TWICE DAILY AS NEEDED  3  . losartan (COZAAR) 25 MG tablet TAKE 1 TABLET BY MOUTH EVERY DAY 90 tablet 2  . methocarbamol (ROBAXIN) 500 MG tablet Take 1 tablet (500 mg total) by mouth every 8 (eight) hours as needed for muscle spasms. 60 tablet 2  . NON FORMULARY Stool softener 100 mg... Take 1 capsule by mouth  once daily.     . Olopatadine HCl 0.2 % SOLN INSTILL 1 DROP INTO BOTH EYES TWICE A DAY    . rosuvastatin (CRESTOR) 10 MG tablet Take 10 mg by mouth daily.  2   Current Facility-Administered Medications  Medication Dose Route Frequency Provider Last Rate Last Admin  . 0.9 %  sodium chloride infusion  500 mL Intravenous Once Irene Shipper, MD      . bupivacaine (MARCAINE) 0.5 % (with pres) injection 3 mL  3 mL Other Once Magnus Sinning, MD        SURGICAL HISTORY:  Past Surgical History:  Procedure Laterality Date  . ablation  04/09/2009   s/p afib and atrial flutter ablation by [facility]  . ABLATION OF DYSRHYTHMIC FOCUS  10/15/09   repeat afib ablation by [facility]  . APPENDECTOMY    . CATARACT EXTRACTION    . CATARACT EXTRACTION, BILATERAL     with lens implants   .  COLONOSCOPY    . CORNEAL LACERATION REPAIR    . EYE SURGERY    . INGUINAL HERNIA REPAIR    . POLYPECTOMY    . TONSILLECTOMY    . VASECTOMY      REVIEW OF SYSTEMS:  Constitutional: positive for fatigue Eyes: negative Ears, nose, mouth, throat, and face: positive for epistaxis Respiratory: negative Cardiovascular: negative Gastrointestinal: negative Genitourinary:negative Integument/breast: negative Hematologic/lymphatic: negative Musculoskeletal:negative Neurological: negative Behavioral/Psych: negative Endocrine: negative Allergic/Immunologic: negative   PHYSICAL EXAMINATION: General appearance: alert, cooperative and no distress Head: Normocephalic, without obvious abnormality, atraumatic Neck: no adenopathy, no JVD, supple, symmetrical, trachea midline and thyroid not enlarged, symmetric, no tenderness/mass/nodules Lymph nodes: Cervical, supraclavicular, and axillary nodes normal. Resp: clear to auscultation bilaterally Back: symmetric, no curvature. ROM normal. No CVA tenderness. Cardio: regular rate and rhythm, S1, S2 normal, no murmur, click, rub or gallop GI: soft, non-tender; bowel sounds normal; no masses,  no organomegaly Extremities: extremities normal, atraumatic, no cyanosis or edema Neurologic: Alert and oriented X 3, normal strength and tone. Normal symmetric reflexes. Normal coordination and gait  ECOG PERFORMANCE STATUS: 1 - Symptomatic but completely ambulatory  Blood pressure 125/75, pulse 87, temperature (!) 97.1 F (36.2 C), temperature source Tympanic, resp. rate 18, height 5\' 9"  (1.753 m), weight 159 lb 14.4 oz (72.5 kg), SpO2 100 %.  LABORATORY DATA: Lab Results  Component Value Date   WBC 10.7 (H) 06/05/2020   HGB 13.8 06/05/2020   HCT 46.9 06/05/2020   MCV 66.1 (L) 06/05/2020   PLT 706 (H) 06/05/2020      Chemistry      Component Value Date/Time   NA 142 04/03/2020 1316   NA 143 10/18/2019 0836   K 4.7 04/03/2020 1316   CL 106  04/03/2020 1316   CO2 26 04/03/2020 1316   BUN 15 04/03/2020 1316   BUN 12 10/18/2019 0836   CREATININE 1.16 04/03/2020 1316      Component Value Date/Time   CALCIUM 9.5 04/03/2020 1316   ALKPHOS 78 04/03/2020 1316   AST 22 04/03/2020 1316   ALT 18 04/03/2020 1316   BILITOT 0.6 04/03/2020 1316       RADIOGRAPHIC STUDIES: No results found.  ASSESSMENT AND PLAN: This is a very pleasant 85 years old white male with recently diagnosed polycythemia vera with positive Jak 2 mutation.  The patient is also currently on hormonal therapy for androgen deficiency. He underwent phlebotomy on as-needed basis in the past. CBC today showed hematocrit of 46.9%.  He continues to have  persistent thrombocytosis secondary to the myeloproliferative disorder. He will not need phlebotomy today but I discussed with the patient the option of adding hydroxyurea as treatment for the thrombocytosis.  He is in agreement with the plan and he will start hydroxyurea 500 mg p.o. daily. I will see him back for follow-up visit in 1 months for evaluation and repeat blood work. For the epistaxis, I will refer the patient to Dr. Radene Journey for evaluation of his condition. For the history of Hemoccult positive stool, he was referred by his PCP to Dr. Gwenlyn Found for evaluation. The patient was advised to call immediately if he has any concerning symptoms in the interval. The patient voices understanding of current disease status and treatment options and is in agreement with the current care plan. All questions were answered. The patient knows to call the clinic with any problems, questions or concerns. We can certainly see the patient much sooner if necessary.  Disclaimer: This note was dictated with voice recognition software. Similar sounding words can inadvertently be transcribed and may not be corrected upon review.

## 2020-06-06 ENCOUNTER — Encounter: Payer: Self-pay | Admitting: Internal Medicine

## 2020-07-10 ENCOUNTER — Inpatient Hospital Stay: Payer: Medicare HMO | Attending: Internal Medicine | Admitting: Internal Medicine

## 2020-07-10 ENCOUNTER — Inpatient Hospital Stay: Payer: Medicare HMO

## 2020-07-10 ENCOUNTER — Other Ambulatory Visit: Payer: Self-pay

## 2020-07-10 VITALS — BP 125/76 | HR 77 | Temp 97.1°F | Resp 18 | Wt 157.4 lb

## 2020-07-10 DIAGNOSIS — D45 Polycythemia vera: Secondary | ICD-10-CM

## 2020-07-10 DIAGNOSIS — D75839 Thrombocytosis, unspecified: Secondary | ICD-10-CM | POA: Insufficient documentation

## 2020-07-10 DIAGNOSIS — Z79899 Other long term (current) drug therapy: Secondary | ICD-10-CM | POA: Insufficient documentation

## 2020-07-10 DIAGNOSIS — D473 Essential (hemorrhagic) thrombocythemia: Secondary | ICD-10-CM | POA: Insufficient documentation

## 2020-07-10 LAB — CBC WITH DIFFERENTIAL (CANCER CENTER ONLY)
Abs Immature Granulocytes: 0.04 10*3/uL (ref 0.00–0.07)
Basophils Absolute: 0.1 10*3/uL (ref 0.0–0.1)
Basophils Relative: 1 %
Eosinophils Absolute: 0.1 10*3/uL (ref 0.0–0.5)
Eosinophils Relative: 1 %
HCT: 47.4 % (ref 39.0–52.0)
Hemoglobin: 14.4 g/dL (ref 13.0–17.0)
Immature Granulocytes: 0 %
Lymphocytes Relative: 12 %
Lymphs Abs: 1.3 10*3/uL (ref 0.7–4.0)
MCH: 20.8 pg — ABNORMAL LOW (ref 26.0–34.0)
MCHC: 30.4 g/dL (ref 30.0–36.0)
MCV: 68.4 fL — ABNORMAL LOW (ref 80.0–100.0)
Monocytes Absolute: 0.7 10*3/uL (ref 0.1–1.0)
Monocytes Relative: 7 %
Neutro Abs: 8 10*3/uL — ABNORMAL HIGH (ref 1.7–7.7)
Neutrophils Relative %: 79 %
Platelet Count: 394 10*3/uL (ref 150–400)
RBC: 6.93 MIL/uL — ABNORMAL HIGH (ref 4.22–5.81)
RDW: 23.4 % — ABNORMAL HIGH (ref 11.5–15.5)
WBC Count: 10.2 10*3/uL (ref 4.0–10.5)
nRBC: 0 % (ref 0.0–0.2)

## 2020-07-10 LAB — CMP (CANCER CENTER ONLY)
ALT: 14 U/L (ref 0–44)
AST: 19 U/L (ref 15–41)
Albumin: 4 g/dL (ref 3.5–5.0)
Alkaline Phosphatase: 69 U/L (ref 38–126)
Anion gap: 9 (ref 5–15)
BUN: 14 mg/dL (ref 8–23)
CO2: 23 mmol/L (ref 22–32)
Calcium: 9.7 mg/dL (ref 8.9–10.3)
Chloride: 108 mmol/L (ref 98–111)
Creatinine: 1.12 mg/dL (ref 0.61–1.24)
GFR, Estimated: >60 mL/min (ref >60)
Glucose, Bld: 95 mg/dL (ref 70–99)
Potassium: 4.3 mmol/L (ref 3.5–5.1)
Sodium: 140 mmol/L (ref 135–145)
Total Bilirubin: 0.9 mg/dL (ref 0.3–1.2)
Total Protein: 6.9 g/dL (ref 6.5–8.1)

## 2020-07-10 LAB — LACTATE DEHYDROGENASE: LDH: 139 U/L (ref 98–192)

## 2020-07-10 NOTE — Progress Notes (Signed)
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    PRIOR THERAPY: Phlebotomy on as-needed basis  CURRENT THERAPY: Hydroxyurea 500 mg p.o. daily.  INTERVAL HISTORY: Dean Pratt 85 y.o. male returns to the clinic today for follow-up visit.  The patient is feeling fine today with no concerning complaints except for mild fatigue.  He has been tolerating his treatment with hydroxyurea fairly well.  He denied having any bleeding, bruises or ecchymosis.  He has no chest pain, shortness of breath, cough or hemoptysis.  He has no nausea, vomiting, diarrhea or constipation.  He has no headache or visual changes.  He is here today for evaluation and repeat blood work.   MEDICAL HISTORY: Past Medical History:  Diagnosis Date   Anxiety    Atrial flutter ([facility])    s/p afib and atrial flutter ablation 04/09/09   Blood transfusion without reported diagnosis    BPH (benign prostatic hyperplasia)    Cancer ([facility])    basal cell CA removed   Cataract    removed with lens implants    Colitis, ischemic ([facility])    secondary to ebolism from afib 1/10   Constipation    GERD (gastroesophageal reflux disease)    Gout    HTN (hypertension)    Hyperlipidemia    Intracranial bleed ([facility]) 04/10/10   Right occipital hematoma with a left homonymous hemianopsia    Migraines    Paroxysmal atrial fibrillation ([facility])    s/p PVI 04/09/09 and 10/17/10   Rosacea    Stroke ([facility]) 04/08/2010   hemorragic, anticoagulation stopped at that time   Tuberculosis    s/p treatment 1964    ALLERGIES:  is allergic to ace inhibitors and warfarin and related.  MEDICATIONS:  Current Outpatient Medications  Medication Sig Dispense Refill   acetaminophen (TYLENOL) 325 MG tablet Take 650 mg by mouth every 6 (six) hours as needed.       allopurinol (ZYLOPRIM) 100 MG  tablet Take 100 mg by mouth at bedtime.       Ascorbic Acid (VITAMIN C) 1000 MG tablet Take 1,000 mg by mouth daily.      aspirin 81 MG tablet Take 81 mg by mouth daily.       diclofenac Sodium (VOLTAREN) 1 % GEL Apply 2-4 g topically 4 (four) times daily. 200 g 3   diltiazem (CARDIZEM CD) 240 MG 24 hr capsule TAKE 1 CAPSULE DAILY. PLEASE CALL OFFICE TO SCHEDULE APPOINTMENT FOR FURTHER REFILLS 15 capsule 0   donepezil (ARICEPT) 5 MG tablet Take 5 mg by mouth at bedtime.     fluticasone (CUTIVATE) 0.05 % cream Apply 1 application topically daily.     hydroxyurea (HYDREA) 500 MG capsule Take 1 capsule (500 mg total) by mouth daily. May take with food to minimize GI side effects. 30 capsule 2   ibuprofen (ADVIL,MOTRIN) 200 MG tablet Take 200 mg by mouth every 6 (six) hours as needed.     LORazepam (ATIVAN) 1 MG tablet TAKE 1/2-1 TABLET TWICE DAILY AS NEEDED  3   losartan (COZAAR) 25 MG tablet TAKE 1 TABLET BY MOUTH EVERY DAY 90 tablet 2   methocarbamol (ROBAXIN) 500 MG tablet Take 1 tablet (500 mg total) by mouth every 8 (eight) hours as needed for muscle spasms. 60 tablet 2   NON FORMULARY Stool softener  100 mg... Take 1 capsule by mouth once daily.      Olopatadine HCl 0.2 % SOLN INSTILL 1 DROP INTO BOTH EYES TWICE A DAY     rosuvastatin (CRESTOR) 10 MG tablet Take 10 mg by mouth daily.  2   Current Facility-Administered Medications  Medication Dose Route Frequency Provider Last Rate Last Admin   0.9 %  sodium chloride infusion  500 mL Intravenous Once Irene Shipper, MD       bupivacaine (MARCAINE) 0.5 % (with pres) injection 3 mL  3 mL Other Once Magnus Sinning, MD        SURGICAL HISTORY:  Past Surgical History:  Procedure Laterality Date   ablation  04/09/2009   s/p afib and atrial flutter ablation by [facility]   ABLATION OF DYSRHYTHMIC FOCUS  10/15/09   repeat afib ablation by [facility]   APPENDECTOMY     CATARACT EXTRACTION     CATARACT EXTRACTION, BILATERAL     with lens implants     COLONOSCOPY     CORNEAL LACERATION REPAIR     EYE SURGERY     INGUINAL HERNIA REPAIR     POLYPECTOMY     TONSILLECTOMY     VASECTOMY      REVIEW OF SYSTEMS:  A comprehensive review of systems was negative except for: Constitutional: positive for fatigue   PHYSICAL EXAMINATION: General appearance: alert, cooperative, fatigued, and no distress Head: Normocephalic, without obvious abnormality, atraumatic Neck: no adenopathy, no JVD, supple, symmetrical, trachea midline, and thyroid not enlarged, symmetric, no tenderness/mass/nodules Lymph nodes: Cervical, supraclavicular, and axillary nodes normal. Resp: clear to auscultation bilaterally Back: symmetric, no curvature. ROM normal. No CVA tenderness. Cardio: regular rate and rhythm, S1, S2 normal, no murmur, click, rub or gallop GI: soft, non-tender; bowel sounds normal; no masses,  no organomegaly Extremities: extremities normal, atraumatic, no cyanosis or edema  ECOG PERFORMANCE STATUS: 1 - Symptomatic but completely ambulatory  Blood pressure 125/76, pulse 77, temperature (!) 97.1 F (36.2 C), temperature source Tympanic, resp. rate 18, weight 157 lb 6 oz (71.4 kg), SpO2 99 %.  LABORATORY DATA: Lab Results  Component Value Date   WBC 10.2 07/10/2020   HGB 14.4 07/10/2020   HCT 47.4 07/10/2020   MCV 68.4 (L) 07/10/2020   PLT 394 07/10/2020      Chemistry      Component Value Date/Time   NA 140 07/10/2020 1055   NA 143 10/18/2019 0836   K 4.3 07/10/2020 1055   CL 108 07/10/2020 1055   CO2 23 07/10/2020 1055   BUN 14 07/10/2020 1055   BUN 12 10/18/2019 0836   CREATININE 1.12 07/10/2020 1055      Component Value Date/Time   CALCIUM 9.7 07/10/2020 1055   ALKPHOS 69 07/10/2020 1055   AST 19 07/10/2020 1055   ALT 14 07/10/2020 1055   BILITOT 0.9 07/10/2020 1055       RADIOGRAPHIC STUDIES: No results found.  ASSESSMENT AND PLAN: This is a very pleasant 85 years old white male with recently diagnosed polycythemia  vera with positive Jak 2 mutation.  The patient is also currently on hormonal therapy for androgen deficiency. He underwent phlebotomy on as-needed basis in the past.  He also had essential thrombocythemia. The patient is started treatment with hydroxyurea 500 mg p.o. daily and has been tolerating it fairly well. Repeat CBC today showed improvement of his platelets count as well as a stable hemoglobin and hematocrit and total white blood count. I recommended for  the patient to continue his current treatment with hydroxyurea with the same dose. I will see him back for follow-up visit in 1 months for evaluation and repeat blood work. He was advised to call immediately if he has any concerning symptoms in the interval. The patient voices understanding of current disease status and treatment options and is in agreement with the current care plan. All questions were answered. The patient knows to call the clinic with any problems, questions or concerns. We can certainly see the patient much sooner if necessary.  Disclaimer: This note was dictated with voice recognition software. Similar sounding words can inadvertently be transcribed and may not be corrected upon review.

## 2020-07-16 ENCOUNTER — Encounter: Payer: Self-pay | Admitting: Internal Medicine

## 2020-07-16 ENCOUNTER — Other Ambulatory Visit: Payer: Self-pay

## 2020-07-16 ENCOUNTER — Ambulatory Visit (INDEPENDENT_AMBULATORY_CARE_PROVIDER_SITE_OTHER): Payer: Medicare HMO | Admitting: Otolaryngology

## 2020-07-16 DIAGNOSIS — R040 Epistaxis: Secondary | ICD-10-CM

## 2020-07-16 NOTE — Progress Notes (Signed)
HPI: Dean Pratt is a 85 y.o. male who presents is referred by Dr. Lorna Few for evaluation of epistaxis.  He has been having some nosebleeds over the past couple months that alternates from side to side but most commonly from the right side.  He used some over-the-counter antibleeding products that seem to help.  He had a bad nosebleed about 2 weeks ago and used blood stop.  But he has had no further bleeding over the past week and a half.  He is not on any blood thinners.  He is breathing okay..  Past Medical History:  Diagnosis Date   Anxiety    Atrial flutter ([facility])    s/p afib and atrial flutter ablation 04/09/09   Blood transfusion without reported diagnosis    BPH (benign prostatic hyperplasia)    Cancer ([facility])    basal cell CA removed   Cataract    removed with lens implants    Colitis, ischemic ([facility])    secondary to ebolism from afib 1/10   Constipation    GERD (gastroesophageal reflux disease)    Gout    HTN (hypertension)    Hyperlipidemia    Intracranial bleed ([facility]) 04/10/10   Right occipital hematoma with a left homonymous hemianopsia    Migraines    Paroxysmal atrial fibrillation ([facility])    s/p PVI 04/09/09 and 10/17/10   Rosacea    Stroke ([facility]) 04/08/2010   hemorragic, anticoagulation stopped at that time   Tuberculosis    s/p treatment 1964   Past Surgical History:  Procedure Laterality Date   ablation  04/09/2009   s/p afib and atrial flutter ablation by [facility] DYSRHYTHMIC FOCUS  10/15/09   repeat afib ablation by [facility]   APPENDECTOMY     CATARACT EXTRACTION     CATARACT EXTRACTION, BILATERAL     with lens implants    COLONOSCOPY     CORNEAL LACERATION REPAIR     EYE SURGERY     INGUINAL HERNIA REPAIR     POLYPECTOMY     TONSILLECTOMY     VASECTOMY     Social History   Socioeconomic History   Marital status: Married    Spouse name: Not on file   Number of children: Not on file   Years of education: Not on file   Highest education level:  Not on file  Occupational History   Not on file  Tobacco Use   Smoking status: Former    Pack years: 0.00   Smokeless tobacco: Never  Vaping Use   Vaping Use: Never used  Substance and Sexual Activity   Alcohol use: No   Drug use: No   Sexual activity: Not on file  Other Topics Concern   Not on file  Social History Narrative   Pt lives in [place].    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  He is married and lives at home with his wife.  He used to smoke but quit  smoking many years ago.  Does get regular exercise.  No alcohol use.    Social Determinants of Health   Financial Resource Strain: Not on file  Food Insecurity: Not on file  Transportation Needs: Not on file  Physical Activity: Not on file  Stress: Not on file  Social Connections: Not on file   Family History  Problem Relation Age of Onset   Liver cancer Father  Prostate cancer Father    Coronary artery disease Father    COPD Mother    Breast cancer Sister    Colon cancer Neg Hx    Rectal cancer Neg Hx    Stomach cancer Neg Hx    Esophageal cancer Neg Hx    Colon polyps Neg Hx    Allergies  Allergen Reactions   Ace Inhibitors Cough   Warfarin And Related     Intercranial bleed   Prior to Admission medications   Medication Sig Start Date End Date Taking? Authorizing Provider  acetaminophen (TYLENOL) 325 MG tablet Take 650 mg by mouth every 6 (six) hours as needed.      [provider]  allopurinol (ZYLOPRIM) 100 MG tablet Take 100 mg by mouth at bedtime.      [provider]  Ascorbic Acid (VITAMIN C) 1000 MG tablet Take 1,000 mg by mouth daily.     [provider]  aspirin 81 MG tablet Take 81 mg by mouth daily.      [provider]  diclofenac Sodium (VOLTAREN) 1 % GEL Apply 2-4 g topically 4 (four) times daily. 09/15/19   Mcarthur Rossetti, MD  diltiazem (CARDIZEM CD) 240 MG 24 hr capsule TAKE 1 CAPSULE  DAILY. PLEASE CALL OFFICE TO SCHEDULE APPOINTMENT FOR FURTHER REFILLS 05/25/19   Park Liter, MD  donepezil (ARICEPT) 5 MG tablet Take 5 mg by mouth at bedtime. 06/12/19   [provider]  fluticasone (CUTIVATE) 0.05 % cream Apply 1 application topically daily. 11/09/18   [provider]  hydroxyurea (HYDREA) 500 MG capsule Take 1 capsule (500 mg total) by mouth daily. May take with food to minimize GI side effects. 06/05/20   Curt Bears, MD  ibuprofen (ADVIL,MOTRIN) 200 MG tablet Take 200 mg by mouth every 6 (six) hours as needed.    [provider]  LORazepam (ATIVAN) 1 MG tablet TAKE 1/2-1 TABLET TWICE DAILY AS NEEDED 03/18/17   [provider]  losartan (COZAAR) 25 MG tablet TAKE 1 TABLET BY MOUTH EVERY DAY 01/09/20   Allred, Jeneen Rinks, MD  methocarbamol (ROBAXIN) 500 MG tablet Take 1 tablet (500 mg total) by mouth every 8 (eight) hours as needed for muscle spasms. 05/30/19   Magnus Sinning, MD  NON FORMULARY Stool softener 100 mg... Take 1 capsule by mouth once daily.     [provider]  Olopatadine HCl 0.2 % SOLN INSTILL 1 DROP INTO BOTH EYES TWICE A DAY 07/10/18   [provider]  rosuvastatin (CRESTOR) 10 MG tablet Take 10 mg by mouth daily. 09/06/17   [provider]     Positive ROS: Otherwise negative  All other systems have been reviewed and were otherwise negative with the exception of those mentioned in the HPI and as above.  Physical Exam: Constitutional: Alert, well-appearing, no acute distress Ears: External ears without lesions or tenderness. Ear canals are clear bilaterally with intact, clear TMs.  Nasal: External nose without lesions. Septum with minimal deformity.  I cannot identify definite site of origin of his recent epistaxis.  He has several prominent vessels in Kiesselbach's plexus but nothing with scabs and no evidence of recent bleeding.  Nasal passageway is otherwise clear.. Oral: Lips and gums without  lesions. Tongue and palate mucosa without lesions. Posterior oropharynx clear. Neck: No palpable adenopathy or masses Respiratory: Breathing comfortably  Skin: No facial/neck lesions or rash noted.  Procedures  Assessment: History of epistaxis.  Plan: Reviewed with him concerning use  of cottonball and Afrin to help stop nosebleed when he has 1.  If he has recurrent nosebleeds or has a bad nosebleed he will follow-up here for recheck and cauterization if needed.  But I cannot identify definite site of origin of his recent epistaxis.   Radene Journey, MD   CC:

## 2020-07-25 ENCOUNTER — Ambulatory Visit (INDEPENDENT_AMBULATORY_CARE_PROVIDER_SITE_OTHER): Payer: Medicare HMO | Admitting: Internal Medicine

## 2020-07-25 ENCOUNTER — Encounter: Payer: Self-pay | Admitting: Internal Medicine

## 2020-07-25 VITALS — BP 130/86 | HR 60 | Ht 66.0 in | Wt 159.4 lb

## 2020-07-25 DIAGNOSIS — Z86010 Personal history of colonic polyps: Secondary | ICD-10-CM

## 2020-07-25 DIAGNOSIS — R195 Other fecal abnormalities: Secondary | ICD-10-CM | POA: Diagnosis not present

## 2020-07-25 DIAGNOSIS — K5901 Slow transit constipation: Secondary | ICD-10-CM

## 2020-07-25 MED ORDER — SUTAB 1479-225-188 MG PO TABS: 1.0000 | ORAL_TABLET | Freq: Once | ORAL | 0 refills | Status: AC

## 2020-07-25 NOTE — Patient Instructions (Signed)
If you are age 82 or older, your body mass index should be between 23-30. Your Body mass index is 25.72 kg/m. If this is out of the aforementioned range listed, please consider follow up with your Primary Care Provider.  If you are age 58 or younger, your body mass index should be between 19-25. Your Body mass index is 25.72 kg/m. If this is out of the aformentioned range listed, please consider follow up with your Primary Care Provider.   __________________________________________________________  The [facility] providers would like to encourage you to use [facility] to communicate with providers for non-urgent requests or questions.  Due to long hold times on the telephone, sending your provider a message by [facility] may be a faster and more efficient way to get a response.  Please allow 48 business hours for a response.  Please remember that this is for non-urgent requests.   You have been scheduled for an endoscopy and colonoscopy. Please follow the written instructions given to you at your visit today. Please pick up your prep supplies at the pharmacy within the next 1-3 days. If you use inhalers (even only as needed), please bring them with you on the day of your procedure.

## 2020-07-29 ENCOUNTER — Encounter: Payer: Self-pay | Admitting: Internal Medicine

## 2020-07-29 NOTE — Progress Notes (Signed)
HISTORY OF PRESENT ILLNESS:  Dean Pratt is a 85 y.o. male with multiple significant medical problems including history of atrial fibrillation, prior stroke, hypertension, and multiple adenomatous colon polyps with a suspected acquired polyposis syndrome.  He was sent today by his PCP Dr. Virgina Jock regarding Hemoccult positive stool as part of his routine annual evaluation.  The studies were repeated and again returned positive.  Outside records have been reviewed.  Patient does take aspirin.  Occasional ibuprofen.  He was placed on Pepcid recently.  Hemoglobin 13.8.  He denies melena or hematochezia.  Previous colonoscopic exams 2010, 2012, 2013, 2016, 2019, and most recently August 2021.  8 subcentimeter polyps on his most recent examination as well as sigmoid diverticulosis.  More recent CBC July 10, 2020 revealed hemoglobin 14.4.  He is accompanied today by his son-in-law.  He does have chronic stable constipation for which he takes stool softeners.  REVIEW OF SYSTEMS:  All non-GI ROS negative unless otherwise stated in the HPI except for back pain, itching, muscle cramps, night sweats, nosebleeds  Past Medical History:  Diagnosis Date   Anxiety    Atrial flutter ([place])    s/p afib and atrial flutter ablation 04/09/09   Blood transfusion without reported diagnosis    BPH (benign prostatic hyperplasia)    Cancer (HCC)    basal cell CA removed   Cataract    removed with lens implants    Colitis, ischemic ([place])    secondary to ebolism from afib 1/10   Constipation    GERD (gastroesophageal reflux disease)    Gout    HTN (hypertension)    Hyperlipidemia    Intracranial bleed ([place]) 04/10/10   Right occipital hematoma with a left homonymous hemianopsia    Migraines    Paroxysmal atrial fibrillation ([place])    s/p PVI 04/09/09 and 10/17/10   Rosacea    Stroke ([place]) 04/08/2010   hemorragic, anticoagulation stopped at that time   Tuberculosis    s/p treatment 1964    Past Surgical History:   Procedure Laterality Date   ablation  04/09/2009   s/p afib and atrial flutter ablation by [place] DYSRHYTHMIC FOCUS  10/15/09   repeat afib ablation by [facility]   APPENDECTOMY     CATARACT EXTRACTION     CATARACT EXTRACTION, BILATERAL     with lens implants    COLONOSCOPY     CORNEAL LACERATION REPAIR     EYE [place]      Social History SIDHARTH SHARUM  reports that he has quit smoking. He has never used smokeless tobacco. He reports that he does not drink alcohol and does not use drugs.  family history includes Breast cancer in his sister; COPD in his mother; Coronary artery disease in his father; Liver cancer in his father; Prostate cancer in his father.  Allergies  Allergen Reactions   Ace Inhibitors Cough   Warfarin And Related     Intercranial bleed       PHYSICAL EXAMINATION: Vital signs: BP 130/86 (BP Location: Left Arm, Patient Position: Sitting, Cuff Size: Normal)   Pulse 60 Comment: irregular  Ht '5\' 6"'$  (1.676 m) Comment: height measured without shoes  Wt 159 lb 6 oz (72.3 kg)   BMI 25.72 kg/m   Constitutional: Thin elderly male, generally well-appearing, no acute distress Psychiatric: alert and oriented x3,  cooperative Eyes: extraocular movements intact, anicteric, conjunctiva pink Mouth: oral pharynx moist, no lesions Neck: supple no lymphadenopathy Cardiovascular: heart regular rate and rhythm, no murmur Lungs: clear to auscultation bilaterally Abdomen: soft, nontender, nondistended, no obvious ascites, no peritoneal signs, normal bowel sounds, no organomegaly Rectal: Deferred until colonoscopy Extremities: no clubbing, cyanosis, or lower extremity edema bilaterally Skin: no lesions on visible extremities Neuro: No focal deficits.  Cranial nerves intact  ASSESSMENT:  1.  Hemoccult positive stool.  Rule out recurrent neoplasia.  Rule out upper GI mucosal lesion 2.  History  of acquired polyposis syndrome.  Multiple prior colonoscopic exams.  Last examination August 2021 3.  Multiple general medical problems 4.  Advanced age .  Chronic constipation.  PLAN:  1.  Schedule colonoscopy to evaluate Hemoccult-positive stool.  Patient is high risk given his age and comorbidities.The nature of the procedure, as well as the risks, benefits, and alternatives were carefully and thoroughly reviewed with the patient. Ample time for discussion and questions allowed. The patient understood, was satisfied, and agreed to proceed.  2.  Schedule upper endoscopy to evaluate Hemoccult-positive stool.  Patient is high risk as above.The nature of the procedure, as well as the risks, benefits, and alternatives were carefully and thoroughly reviewed with the patient. Ample time for discussion and questions allowed. The patient understood, was satisfied, and agreed to proceed.  3.  Patient may benefit long-term from PPI therapy. 4.  Continue stool softeners

## 2020-08-12 ENCOUNTER — Inpatient Hospital Stay: Payer: Medicare HMO

## 2020-08-12 ENCOUNTER — Inpatient Hospital Stay: Payer: Medicare HMO | Attending: Internal Medicine | Admitting: Internal Medicine

## 2020-08-12 ENCOUNTER — Other Ambulatory Visit: Payer: Self-pay

## 2020-08-12 VITALS — BP 132/77 | HR 62 | Temp 98.3°F | Resp 18 | Ht 66.0 in | Wt 161.2 lb

## 2020-08-12 DIAGNOSIS — D75839 Thrombocytosis, unspecified: Secondary | ICD-10-CM | POA: Insufficient documentation

## 2020-08-12 DIAGNOSIS — I10 Essential (primary) hypertension: Secondary | ICD-10-CM | POA: Insufficient documentation

## 2020-08-12 DIAGNOSIS — R58 Hemorrhage, not elsewhere classified: Secondary | ICD-10-CM | POA: Insufficient documentation

## 2020-08-12 DIAGNOSIS — D473 Essential (hemorrhagic) thrombocythemia: Secondary | ICD-10-CM | POA: Insufficient documentation

## 2020-08-12 DIAGNOSIS — Z79899 Other long term (current) drug therapy: Secondary | ICD-10-CM | POA: Diagnosis not present

## 2020-08-12 DIAGNOSIS — Z85828 Personal history of other malignant neoplasm of skin: Secondary | ICD-10-CM | POA: Diagnosis not present

## 2020-08-12 DIAGNOSIS — R5383 Other fatigue: Secondary | ICD-10-CM | POA: Insufficient documentation

## 2020-08-12 DIAGNOSIS — D45 Polycythemia vera: Secondary | ICD-10-CM

## 2020-08-12 DIAGNOSIS — Z8673 Personal history of transient ischemic attack (TIA), and cerebral infarction without residual deficits: Secondary | ICD-10-CM | POA: Insufficient documentation

## 2020-08-12 DIAGNOSIS — Z9049 Acquired absence of other specified parts of digestive tract: Secondary | ICD-10-CM | POA: Insufficient documentation

## 2020-08-12 LAB — CBC WITH DIFFERENTIAL (CANCER CENTER ONLY)
Abs Immature Granulocytes: 0.02 10*3/uL (ref 0.00–0.07)
Basophils Absolute: 0.1 10*3/uL (ref 0.0–0.1)
Basophils Relative: 1 %
Eosinophils Absolute: 0.2 10*3/uL (ref 0.0–0.5)
Eosinophils Relative: 2 %
HCT: 46.9 % (ref 39.0–52.0)
Hemoglobin: 14.7 g/dL (ref 13.0–17.0)
Immature Granulocytes: 0 %
Lymphocytes Relative: 14 %
Lymphs Abs: 1.2 10*3/uL (ref 0.7–4.0)
MCH: 22.4 pg — ABNORMAL LOW (ref 26.0–34.0)
MCHC: 31.3 g/dL (ref 30.0–36.0)
MCV: 71.4 fL — ABNORMAL LOW (ref 80.0–100.0)
Monocytes Absolute: 0.6 10*3/uL (ref 0.1–1.0)
Monocytes Relative: 7 %
Neutro Abs: 6.4 10*3/uL (ref 1.7–7.7)
Neutrophils Relative %: 76 %
Platelet Count: 405 10*3/uL — ABNORMAL HIGH (ref 150–400)
RBC: 6.57 MIL/uL — ABNORMAL HIGH (ref 4.22–5.81)
RDW: 24 % — ABNORMAL HIGH (ref 11.5–15.5)
WBC Count: 8.6 10*3/uL (ref 4.0–10.5)
nRBC: 0 % (ref 0.0–0.2)

## 2020-08-12 LAB — CMP (CANCER CENTER ONLY)
ALT: 16 U/L (ref 0–44)
AST: 18 U/L (ref 15–41)
Albumin: 4 g/dL (ref 3.5–5.0)
Alkaline Phosphatase: 67 U/L (ref 38–126)
Anion gap: 10 (ref 5–15)
BUN: 12 mg/dL (ref 8–23)
CO2: 23 mmol/L (ref 22–32)
Calcium: 9.8 mg/dL (ref 8.9–10.3)
Chloride: 109 mmol/L (ref 98–111)
Creatinine: 1.12 mg/dL (ref 0.61–1.24)
GFR, Estimated: >60 mL/min (ref >60)
Glucose, Bld: 86 mg/dL (ref 70–99)
Potassium: 4 mmol/L (ref 3.5–5.1)
Sodium: 142 mmol/L (ref 135–145)
Total Bilirubin: 0.7 mg/dL (ref 0.3–1.2)
Total Protein: 6.5 g/dL (ref 6.5–8.1)

## 2020-08-12 LAB — LACTATE DEHYDROGENASE: LDH: 123 U/L (ref 98–192)

## 2020-08-12 NOTE — Progress Notes (Signed)
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    PRIOR THERAPY: Phlebotomy on as-needed basis  CURRENT THERAPY: Hydroxyurea 500 mg p.o. daily.  INTERVAL HISTORY: Dean Pratt 85 y.o. male returns to the clinic today for follow-up visit.  The patient is feeling fine today with no concerning complaints except for mild ecchymosis on the arms.  He denied having any bleeding issues.  He denied having any nausea, vomiting, diarrhea or constipation.  He has no chest pain, shortness of breath, cough or hemoptysis.  He has no recent weight loss or night sweats.  He continues to tolerate his treatment with hydroxyurea fairly well.  The patient is here today for evaluation and repeat blood work.   MEDICAL HISTORY: Past Medical History:  Diagnosis Date   Anxiety    Atrial flutter ([place])    s/p afib and atrial flutter ablation 04/09/09   Blood transfusion without reported diagnosis    BPH (benign prostatic hyperplasia)    Cancer (HCC)    basal cell CA removed   Cataract    removed with lens implants    Colitis, ischemic (HCC)    secondary to ebolism from afib 1/10   Constipation    GERD (gastroesophageal reflux disease)    Gout    HTN (hypertension)    Hyperlipidemia    Intracranial bleed ([place]) 04/10/10   Right occipital hematoma with a left homonymous hemianopsia    Migraines    Paroxysmal atrial fibrillation ([place])    s/p PVI 04/09/09 and 10/17/10   Rosacea    Stroke ([place]) 04/08/2010   hemorragic, anticoagulation stopped at that time   Tuberculosis    s/p treatment 1964    ALLERGIES:  is allergic to ace inhibitors and warfarin and related.  MEDICATIONS:  Current Outpatient Medications  Medication Sig Dispense Refill   acetaminophen (TYLENOL) 325 MG tablet Take 650 mg by mouth every 6 (six) hours as needed.        allopurinol (ZYLOPRIM) 100 MG tablet Take 100 mg by mouth at bedtime.       Ascorbic Acid (VITAMIN C) 1000 MG tablet Take 1,000 mg by mouth daily.      aspirin 81 MG tablet Take 81 mg by mouth daily.       cycloSPORINE (RESTASIS) 0.05 % ophthalmic emulsion Place 1 drop into both eyes as needed.     diclofenac Sodium (VOLTAREN) 1 % GEL Apply 2-4 g topically 4 (four) times daily. 200 g 3   diltiazem (CARDIZEM CD) 240 MG 24 hr capsule TAKE 1 CAPSULE DAILY. PLEASE CALL OFFICE TO SCHEDULE APPOINTMENT FOR FURTHER REFILLS 15 capsule 0   docusate sodium (COLACE) 100 MG capsule Take 1 capsule by mouth as needed.     donepezil (ARICEPT) 5 MG tablet Take 5 mg by mouth at bedtime.     famotidine (PEPCID) 20 MG tablet Take 20 mg by mouth at bedtime as needed.     fluticasone (CUTIVATE) 0.05 % cream Apply 1 application topically daily.     hydroxyurea (HYDREA) 500 MG capsule Take 1 capsule (500 mg total) by mouth daily. May take with food to minimize GI side effects. 30 capsule 2   ibuprofen (ADVIL,MOTRIN) 200 MG tablet Take 200 mg by mouth every 6 (six) hours as needed.     LORazepam (ATIVAN) 1 MG tablet TAKE  1/2-1 TABLET TWICE DAILY AS NEEDED  3   losartan (COZAAR) 25 MG tablet TAKE 1 TABLET BY MOUTH EVERY DAY 90 tablet 2   methocarbamol (ROBAXIN) 500 MG tablet Take 1 tablet (500 mg total) by mouth every 8 (eight) hours as needed for muscle spasms. 60 tablet 2   NON FORMULARY Stool softener 100 mg... Take 1 capsule by mouth once daily.      Olopatadine HCl 0.2 % SOLN INSTILL 1 DROP INTO BOTH EYES TWICE A DAY     rosuvastatin (CRESTOR) 10 MG tablet Take 10 mg by mouth daily.  2   Current Facility-Administered Medications  Medication Dose Route Frequency Provider Last Rate Last Admin   0.9 %  sodium chloride infusion  500 mL Intravenous Once Dean Shipper, MD       bupivacaine (MARCAINE) 0.5 % (with pres) injection 3 mL  3 mL Other Once Dean Sinning, MD        SURGICAL HISTORY:  Past Surgical  History:  Procedure Laterality Date   ablation  04/09/2009   s/p afib and atrial flutter ablation by [facility]   ABLATION OF DYSRHYTHMIC FOCUS  10/15/09   repeat afib ablation by [facility]   APPENDECTOMY     CATARACT EXTRACTION     CATARACT EXTRACTION, BILATERAL     with lens implants    COLONOSCOPY     CORNEAL LACERATION REPAIR     EYE SURGERY     INGUINAL HERNIA REPAIR     POLYPECTOMY     TONSILLECTOMY     VASECTOMY      REVIEW OF SYSTEMS:  A comprehensive review of systems was negative except for: Constitutional: positive for fatigue   PHYSICAL EXAMINATION: General appearance: alert, cooperative, fatigued, and no distress Head: Normocephalic, without obvious abnormality, atraumatic Neck: no adenopathy, no JVD, supple, symmetrical, trachea midline, and thyroid not enlarged, symmetric, no tenderness/mass/nodules Lymph nodes: Cervical, supraclavicular, and axillary nodes normal. Resp: clear to auscultation bilaterally Back: symmetric, no curvature. ROM normal. No CVA tenderness. Cardio: regular rate and rhythm, S1, S2 normal, no murmur, click, rub or gallop GI: soft, non-tender; bowel sounds normal; no masses,  no organomegaly Extremities: extremities normal, atraumatic, no cyanosis or edema  ECOG PERFORMANCE STATUS: 1 - Symptomatic but completely ambulatory  Blood pressure 132/77, pulse 62, temperature 98.3 F (36.8 C), temperature source Tympanic, resp. rate 18, height '5\' 6"'$  (1.676 m), weight 161 lb 3.2 oz (73.1 kg), SpO2 98 %.  LABORATORY DATA: Lab Results  Component Value Date   WBC 8.6 08/12/2020   HGB 14.7 08/12/2020   HCT 46.9 08/12/2020   MCV 71.4 (L) 08/12/2020   PLT 405 (H) 08/12/2020      Chemistry      Component Value Date/Time   NA 142 08/12/2020 1050   NA 143 10/18/2019 0836   K 4.0 08/12/2020 1050   CL 109 08/12/2020 1050   CO2 23 08/12/2020 1050   BUN 12 08/12/2020 1050   BUN 12 10/18/2019 0836   CREATININE 1.12 08/12/2020 1050      Component Value Date/Time    CALCIUM 9.8 08/12/2020 1050   ALKPHOS 67 08/12/2020 1050   AST 18 08/12/2020 1050   ALT 16 08/12/2020 1050   BILITOT 0.7 08/12/2020 1050       RADIOGRAPHIC STUDIES: No results found.  ASSESSMENT AND PLAN: This is a very pleasant 85 years old white male with recently diagnosed polycythemia vera with positive Jak 2 mutation.  The patient is also currently on  hormonal therapy for androgen deficiency. He underwent phlebotomy on as-needed basis in the past.  He also had essential thrombocythemia. The patient is started treatment with hydroxyurea 500 mg p.o. daily and has been tolerating it fairly well. The patient has no complaints today and tolerating hydroxyurea well. Repeat blood work today is unremarkable except for mild thrombocytosis. I recommended for the patient to continue his current treatment with hydroxyurea with the same dose. I will see him back for follow-up visit in 2 months for evaluation and repeat blood work. The patient was advised to call immediately if he has any concerning symptoms in the interval. The patient voices understanding of current disease status and treatment options and is in agreement with the current care plan. All questions were answered. The patient knows to call the clinic with any problems, questions or concerns. We can certainly see the patient much sooner if necessary.  Disclaimer: This note was dictated with voice recognition software. Similar sounding words can inadvertently be transcribed and may not be corrected upon review.

## 2020-08-13 ENCOUNTER — Telehealth (INDEPENDENT_AMBULATORY_CARE_PROVIDER_SITE_OTHER): Payer: Self-pay | Admitting: Otolaryngology

## 2020-08-13 NOTE — Telephone Encounter (Signed)
Patient called stating that he had a little bit of bleeding from the left side of his nose earlier this morning but it stopped fairly easily with the bleed stop. Suggest that if it keeps rebleeding he will call our office for appointment for possible cauterization if bleeding site identified.  I had previously seen him over a month ago and cannot identify definite bleeding site.

## 2020-08-26 ENCOUNTER — Ambulatory Visit (INDEPENDENT_AMBULATORY_CARE_PROVIDER_SITE_OTHER): Payer: Medicare HMO | Admitting: Otolaryngology

## 2020-08-26 ENCOUNTER — Other Ambulatory Visit: Payer: Self-pay

## 2020-08-26 DIAGNOSIS — Z87898 Personal history of other specified conditions: Secondary | ICD-10-CM

## 2020-08-26 NOTE — Progress Notes (Signed)
HPI: Dean Pratt is a 85 y.o. male who returns today for evaluation of recurrent epistaxis.  Patient had called last week stating that he was having nosebleeds from the right nostril.  He was unable to visit [place] on Friday of last week and comes in today to have this checked.  He apparently had no bleeding over the weekend.  He states that the bleeding occurs mostly in the mornings or when he bends over.  Sometimes occurs while he is in the shower.  It is always from the right nostril.  He is having no difficulty breathing through the nose.Marland Kitchen He is able to stop the bleeding fairly easily by using over-the-counter blood stop for epistaxis.  Past Medical History:  Diagnosis Date   Anxiety    Atrial flutter ([place])    s/p afib and atrial flutter ablation 04/09/09   Blood transfusion without reported diagnosis    BPH (benign prostatic hyperplasia)    Cancer (HCC)    basal cell CA removed   Cataract    removed with lens implants    Colitis, ischemic ([facility])    secondary to ebolism from afib 1/10   Constipation    GERD (gastroesophageal reflux disease)    Gout    HTN (hypertension)    Hyperlipidemia    Intracranial bleed ([place]) 04/10/10   Right occipital hematoma with a left homonymous hemianopsia    Migraines    Paroxysmal atrial fibrillation ([place])    s/p PVI 04/09/09 and 10/17/10   Rosacea    Stroke ([place]) 04/08/2010   hemorragic, anticoagulation stopped at that time   Tuberculosis    s/p treatment 1964   Past Surgical History:  Procedure Laterality Date   ablation  04/09/2009   s/p afib and atrial flutter ablation by [facility] DYSRHYTHMIC FOCUS  10/15/09   repeat afib ablation by [facility]   APPENDECTOMY     CATARACT EXTRACTION     CATARACT EXTRACTION, BILATERAL     with lens implants    COLONOSCOPY     CORNEAL LACERATION REPAIR     EYE SURGERY     INGUINAL HERNIA REPAIR     POLYPECTOMY     TONSILLECTOMY     VASECTOMY     Social History   Socioeconomic History   Marital status:  Married    Spouse name: Not on file   Number of children: Not on file   Years of education: Not on file   Highest education level: Not on file  Occupational History   Not on file  Tobacco Use   Smoking status: Former   Smokeless tobacco: Never  Vaping Use   Vaping Use: Never used  Substance and Sexual Activity   Alcohol use: No   Drug use: No   Sexual activity: Not on file  Other Topics Concern   Not on file  Social History Narrative   Pt lives in [place].    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  He is married and lives at home with his wife.  He used to smoke but quit  smoking many years ago.  Does get regular exercise.  No alcohol use.    Social Determinants of Health   Financial Resource Strain: Not on file  Food Insecurity: Not on file  Transportation Needs: Not on file  Physical Activity: Not on file  Stress: Not on file  Social Connections: Not on file  Family History  Problem Relation Age of Onset   Liver cancer Father    Prostate cancer Father    Coronary artery disease Father    COPD Mother    Breast cancer Sister    Colon cancer Neg Hx    Rectal cancer Neg Hx    Stomach cancer Neg Hx    Esophageal cancer Neg Hx    Colon polyps Neg Hx    Allergies  Allergen Reactions   Ace Inhibitors Cough   Warfarin And Related     Intercranial bleed   Prior to Admission medications   Medication Sig Start Date End Date Taking? Authorizing Provider  acetaminophen (TYLENOL) 325 MG tablet Take 650 mg by mouth every 6 (six) hours as needed.      [provider]  allopurinol (ZYLOPRIM) 100 MG tablet Take 100 mg by mouth at bedtime.      [provider]  Ascorbic Acid (VITAMIN C) 1000 MG tablet Take 1,000 mg by mouth daily.     [provider]  aspirin 81 MG tablet Take 81 mg by mouth daily.      [provider]  cycloSPORINE (RESTASIS) 0.05 % ophthalmic emulsion Place 1 drop  into both eyes as needed. 03/15/18   [provider]  diclofenac Sodium (VOLTAREN) 1 % GEL Apply 2-4 g topically 4 (four) times daily. 09/15/19   Mcarthur Rossetti, MD  diltiazem (CARDIZEM CD) 240 MG 24 hr capsule TAKE 1 CAPSULE DAILY. PLEASE CALL OFFICE TO SCHEDULE APPOINTMENT FOR FURTHER REFILLS 05/25/19   Park Liter, MD  docusate sodium (COLACE) 100 MG capsule Take 1 capsule by mouth as needed. 01/20/11   [provider]  donepezil (ARICEPT) 5 MG tablet Take 5 mg by mouth at bedtime. 06/12/19   [provider]  famotidine (PEPCID) 20 MG tablet Take 20 mg by mouth at bedtime as needed. 05/16/20   [provider]  fluticasone (CUTIVATE) 0.05 % cream Apply 1 application topically daily. 11/09/18   [provider]  hydroxyurea (HYDREA) 500 MG capsule Take 1 capsule (500 mg total) by mouth daily. May take with food to minimize GI side effects. 06/05/20   Curt Bears, MD  ibuprofen (ADVIL,MOTRIN) 200 MG tablet Take 200 mg by mouth every 6 (six) hours as needed.    [provider]  LORazepam (ATIVAN) 1 MG tablet TAKE 1/2-1 TABLET TWICE DAILY AS NEEDED 03/18/17   [provider]  losartan (COZAAR) 25 MG tablet TAKE 1 TABLET BY MOUTH EVERY DAY 01/09/20   Allred, Jeneen Rinks, MD  methocarbamol (ROBAXIN) 500 MG tablet Take 1 tablet (500 mg total) by mouth every 8 (eight) hours as needed for muscle spasms. 05/30/19   Magnus Sinning, MD  NON FORMULARY Stool softener 100 mg... Take 1 capsule by mouth once daily.     [provider]  Olopatadine HCl 0.2 % SOLN INSTILL 1 DROP INTO BOTH EYES TWICE A DAY 07/10/18   [provider]  rosuvastatin (CRESTOR) 10 MG tablet Take 10 mg by mouth daily. 09/06/17   [provider]     Positive ROS: Otherwise negative  All other systems have been reviewed and were otherwise negative with the exception of those mentioned in the HPI and as above.  Physical Exam: Constitutional: Alert,  well-appearing, no acute distress Ears: External ears without lesions or tenderness. Ear canals are clear bilaterally with intact, clear TMs.  Nasal: External nose without lesions. Septum with minimal deformity.  On close examination of  the nose I cannot identify definite site of origin of his epistaxis.  There is no scabbing or crusting in the nose although there is a slight erythematous area on the lateral portion anterior right inferior turbinate but this did not bleed when rubbing with a Q-tip.  No polyps or intranasal masses noted otherwise.  No signs of infection..  Oral: Lips and gums without lesions. Tongue and palate mucosa without lesions. Posterior oropharynx clear. Neck: No palpable adenopathy or masses Respiratory: Breathing comfortably  Skin: No facial/neck lesions or rash noted.  Procedures  Assessment: History of recurrent epistaxis  Plan: Briefly reviewed with him today that I cannot identify definite site of origin of his epistaxis and that the nasal passageway was otherwise clear.  Reviewed with him again concerning use of cottonball Afrin and/or the blood stop to help stop the bleeding when he is having bleeding and would recommend follow-up here next time he has a bad nosebleed or is able to create bleeding while in the office as he has a clear nasal exam today he could identify definite site of origin of his epistaxis in order to cauterize it. He will follow-up as needed.   Radene Journey, MD

## 2020-08-28 DIAGNOSIS — L720 Epidermal cyst: Secondary | ICD-10-CM | POA: Diagnosis not present

## 2020-08-28 DIAGNOSIS — D225 Melanocytic nevi of trunk: Secondary | ICD-10-CM | POA: Diagnosis not present

## 2020-08-28 DIAGNOSIS — C44319 Basal cell carcinoma of skin of other parts of face: Secondary | ICD-10-CM | POA: Diagnosis not present

## 2020-08-28 DIAGNOSIS — L821 Other seborrheic keratosis: Secondary | ICD-10-CM | POA: Diagnosis not present

## 2020-08-28 DIAGNOSIS — Z1283 Encounter for screening for malignant neoplasm of skin: Secondary | ICD-10-CM | POA: Diagnosis not present

## 2020-08-28 DIAGNOSIS — L820 Inflamed seborrheic keratosis: Secondary | ICD-10-CM | POA: Diagnosis not present

## 2020-08-30 ENCOUNTER — Other Ambulatory Visit: Payer: Self-pay | Admitting: Internal Medicine

## 2020-09-02 ENCOUNTER — Other Ambulatory Visit: Payer: Self-pay | Admitting: Internal Medicine

## 2020-09-02 ENCOUNTER — Encounter: Payer: Self-pay | Admitting: Internal Medicine

## 2020-09-02 ENCOUNTER — Ambulatory Visit (AMBULATORY_SURGERY_CENTER): Payer: Medicare HMO | Admitting: Internal Medicine

## 2020-09-02 ENCOUNTER — Other Ambulatory Visit: Payer: Self-pay

## 2020-09-02 VITALS — BP 130/86 | HR 78 | Temp 98.7°F | Resp 16 | Ht 66.0 in | Wt 159.0 lb

## 2020-09-02 DIAGNOSIS — R195 Other fecal abnormalities: Secondary | ICD-10-CM

## 2020-09-02 DIAGNOSIS — K253 Acute gastric ulcer without hemorrhage or perforation: Secondary | ICD-10-CM

## 2020-09-02 DIAGNOSIS — K5520 Angiodysplasia of colon without hemorrhage: Secondary | ICD-10-CM

## 2020-09-02 DIAGNOSIS — K319 Disease of stomach and duodenum, unspecified: Secondary | ICD-10-CM | POA: Diagnosis not present

## 2020-09-02 DIAGNOSIS — K2100 Gastro-esophageal reflux disease with esophagitis, without bleeding: Secondary | ICD-10-CM | POA: Diagnosis not present

## 2020-09-02 DIAGNOSIS — K222 Esophageal obstruction: Secondary | ICD-10-CM | POA: Diagnosis not present

## 2020-09-02 DIAGNOSIS — D124 Benign neoplasm of descending colon: Secondary | ICD-10-CM | POA: Diagnosis not present

## 2020-09-02 DIAGNOSIS — Z86010 Personal history of colonic polyps: Secondary | ICD-10-CM | POA: Diagnosis not present

## 2020-09-02 DIAGNOSIS — I10 Essential (primary) hypertension: Secondary | ICD-10-CM | POA: Diagnosis not present

## 2020-09-02 DIAGNOSIS — K648 Other hemorrhoids: Secondary | ICD-10-CM | POA: Diagnosis not present

## 2020-09-02 DIAGNOSIS — I4891 Unspecified atrial fibrillation: Secondary | ICD-10-CM | POA: Diagnosis not present

## 2020-09-02 DIAGNOSIS — K2970 Gastritis, unspecified, without bleeding: Secondary | ICD-10-CM | POA: Diagnosis not present

## 2020-09-02 DIAGNOSIS — D122 Benign neoplasm of ascending colon: Secondary | ICD-10-CM

## 2020-09-02 DIAGNOSIS — K5289 Other specified noninfective gastroenteritis and colitis: Secondary | ICD-10-CM

## 2020-09-02 DIAGNOSIS — K529 Noninfective gastroenteritis and colitis, unspecified: Secondary | ICD-10-CM | POA: Diagnosis not present

## 2020-09-02 DIAGNOSIS — K5730 Diverticulosis of large intestine without perforation or abscess without bleeding: Secondary | ICD-10-CM | POA: Diagnosis not present

## 2020-09-02 DIAGNOSIS — K219 Gastro-esophageal reflux disease without esophagitis: Secondary | ICD-10-CM | POA: Diagnosis not present

## 2020-09-02 MED ORDER — SODIUM CHLORIDE 0.9 % IV SOLN: 500.0000 mL | Freq: Once | INTRAVENOUS | Status: DC

## 2020-09-02 MED ORDER — PANTOPRAZOLE SODIUM 40 MG PO TBEC: 40.0000 mg | DELAYED_RELEASE_TABLET | Freq: Every day | ORAL | 3 refills | Status: DC

## 2020-09-02 NOTE — Op Note (Signed)
[facility] Patient Name: Dean Pratt Procedure Date: 09/02/2020 1:29 PM MRN: [id number] Endoscopist: Docia Chuck. Henrene Pastor , MD Age: 85 Referring MD:  Date of Birth: 28-Aug-1935 Gender: Male Account #: [id number] Procedure:                Upper GI endoscopy with biopsies Indications:              Heme positive stool Medicines:                Monitored Anesthesia Care Procedure:                Pre-Anesthesia Assessment:                           - Prior to the procedure, a History and Physical                            was performed, and patient medications and                            allergies were reviewed. The patient's tolerance of                            previous anesthesia was also reviewed. The risks                            and benefits of the procedure and the sedation                            options and risks were discussed with the patient.                            All questions were answered, and informed consent                            was obtained. Prior Anticoagulants: The patient has                            taken no previous anticoagulant or antiplatelet                            agents. ASA Grade Assessment: III - A patient with                            severe systemic disease. After reviewing the risks                            and benefits, the patient was deemed in                            satisfactory condition to undergo the procedure.                           After obtaining informed consent, the endoscope was  passed under direct vision. Throughout the                            procedure, the patient's blood pressure, pulse, and                            oxygen saturations were monitored continuously. The                            GIF [id number] was introduced through the                            mouth, and advanced to the second part of duodenum.                            The upper GI endoscopy  was accomplished without                            difficulty. The patient tolerated the procedure                            well. Scope In: Scope Out: Findings:                 The esophagus revealed distal esophagitis as                            manifested by erythema and friability. No                            Barrett's. There was also a ringlike stricture at                            the gastroesophageal junction.                           The stomach revealed multiple antral erosions.                            Biopsies were taken with a cold forceps for                            histology.                           The examined duodenum was normal.                           The cardia and gastric fundus were normal on                            retroflexion. Complications:            No immediate complications. Estimated Blood Loss:     Estimated blood loss: none. Impression:               1. GERD with esophagitis and esophageal stricture  2. Erosive gastritis status post biopsies. Recommendation:           1. Prescribe pantoprazole 40 mg daily; #30; 11                            refills. This medication will heal the inflammation                            of your esophagus as well as stomach. It will                            protect you against developing ulcers in the future.                           2. Follow-up biopsies                           3. Return to the care of your primary provider. GI                            follow-up as needed. Docia Chuck. Henrene Pastor, MD 09/02/2020 2:36:32 PM This report has been signed electronically.

## 2020-09-02 NOTE — Progress Notes (Signed)
Report to PACU, RN, vss, BBS= Clear.  

## 2020-09-02 NOTE — Progress Notes (Signed)
GI PREPROCEDURE H&P  HISTORY OF PRESENT ILLNESS:   Dean Pratt is a 85 y.o. male with multiple significant medical problems including history of atrial fibrillation, prior stroke, hypertension, and multiple adenomatous colon polyps with a suspected acquired polyposis syndrome.  He was sent today by his PCP Dr. Virgina Jock regarding Hemoccult positive stool as part of his routine annual evaluation.  The studies were repeated and again returned positive.  Outside records have been reviewed.  Patient does take aspirin.  Occasional ibuprofen.  He was placed on Pepcid recently.  Hemoglobin 13.8.  He denies melena or hematochezia.  Previous colonoscopic exams 2010, 2012, 2013, 2016, 2019, and most recently August 2021.  8 subcentimeter polyps on his most recent examination as well as sigmoid diverticulosis.  More recent CBC July 10, 2020 revealed hemoglobin 14.4.  He is accompanied today by his son-in-law.  He does have chronic stable constipation for which he takes stool softeners.   REVIEW OF SYSTEMS:   All non-GI ROS negative unless otherwise stated in the HPI except for back pain, itching, muscle cramps, night sweats, nosebleeds       Past Medical History:  Diagnosis Date   Anxiety     Atrial flutter ([place])      s/p afib and atrial flutter ablation 04/09/09   Blood transfusion without reported diagnosis     BPH (benign prostatic hyperplasia)     Cancer (HCC)      basal cell CA removed   Cataract      removed with lens implants    Colitis, ischemic ([place])      secondary to ebolism from afib 1/10   Constipation     GERD (gastroesophageal reflux disease)     Gout     HTN (hypertension)     Hyperlipidemia     Intracranial bleed ([place]) 04/10/10    Right occipital hematoma with a left homonymous hemianopsia    Migraines     Paroxysmal atrial fibrillation ([place])      s/p PVI 04/09/09 and 10/17/10   Rosacea     Stroke ([place]) 04/08/2010    hemorragic, anticoagulation stopped at that time   Tuberculosis       s/p treatment 1964           Past Surgical History:  Procedure Laterality Date   ablation   04/09/2009    s/p afib and atrial flutter ablation by [place] DYSRHYTHMIC FOCUS   10/15/09    repeat afib ablation by [facility]   APPENDECTOMY       CATARACT EXTRACTION       CATARACT EXTRACTION, BILATERAL        with lens implants    COLONOSCOPY       CORNEAL LACERATION REPAIR       EYE [place]          Social History ISAID COTRONE  reports that he has quit smoking. He has never used smokeless tobacco. He reports that he does not drink alcohol and does not use drugs.   family history includes Breast cancer in his sister; COPD in his mother; Coronary artery disease in his father; Liver cancer in his father; Prostate cancer in his father.        Allergies  Allergen Reactions   Ace Inhibitors Cough  Warfarin And Related        Intercranial bleed          PHYSICAL EXAMINATION: Vital signs: BP 130/86 (BP Location: Left Arm, Patient Position: Sitting, Cuff Size: Normal)   Pulse 60 Comment: irregular  Ht '5\' 6"'$  (1.676 m) Comment: height measured without shoes  Wt 159 lb 6 oz (72.3 kg)   BMI 25.72 kg/m   Constitutional: Thin elderly male, generally well-appearing, no acute distress Psychiatric: alert and oriented x3, cooperative Eyes: extraocular movements intact, anicteric, conjunctiva pink Mouth: oral pharynx moist, no lesions Neck: supple no lymphadenopathy Cardiovascular: heart regular rate and rhythm, no murmur Lungs: clear to auscultation bilaterally Abdomen: soft, nontender, nondistended, no obvious ascites, no peritoneal signs, normal bowel sounds, no organomegaly Rectal: Deferred until colonoscopy Extremities: no clubbing, cyanosis, or lower extremity edema bilaterally Skin: no lesions on visible extremities Neuro: No focal deficits.  Cranial nerves intact   ASSESSMENT:   1.   Hemoccult positive stool.  Rule out recurrent neoplasia.  Rule out upper GI mucosal lesion 2.  History of acquired polyposis syndrome.  Multiple prior colonoscopic exams.  Last examination August 2021 3.  Multiple general medical problems 4.  Advanced age .  Chronic constipation.   PLAN:   1.  Schedule colonoscopy to evaluate Hemoccult-positive stool.  Patient is high risk given his age and comorbidities.The nature of the procedure, as well as the risks, benefits, and alternatives were carefully and thoroughly reviewed with the patient. Ample time for discussion and questions allowed. The patient understood, was satisfied, and agreed to proceed.  2.  Schedule upper endoscopy to evaluate Hemoccult-positive stool.  Patient is high risk as above.The nature of the procedure, as well as the risks, benefits, and alternatives were carefully and thoroughly reviewed with the patient. Ample time for discussion and questions allowed. The patient understood, was satisfied, and agreed to proceed.  3.  Patient may benefit long-term from PPI therapy. 4.  Continue stool softeners   Patient presents today for colonoscopy and upper endoscopy to evaluate Hemoccult-positive stool.  He was evaluated in the office July 25, 2020.  The above comprehensive evaluation for that encounter.  There have been no significant interval clinical changes, changes in his history, or physical examination.

## 2020-09-02 NOTE — Progress Notes (Signed)
Vs by CW in adm 

## 2020-09-02 NOTE — Progress Notes (Signed)
Called to room to assist during endoscopic procedure.  Patient ID and intended procedure confirmed with present staff. Received instructions for my participation in the procedure from the performing physician.  

## 2020-09-02 NOTE — Op Note (Signed)
[facility] Patient Name: Dean Pratt Procedure Date: 09/02/2020 1:30 PM MRN: [id number] Endoscopist: Docia Chuck. Henrene Pastor , MD Age: 85 Referring MD:  Date of Birth: [birth year] Gender: Male Account #: [id number] Procedure:                Colonoscopy with cold snare polypectomy x 2; with                            biopsies Indications:              Heme positive stool. History of acquired polyposis.                            Prior examinations 2010, 2012, 2013, 2016, 2019,                            2021 Medicines:                Monitored Anesthesia Care Procedure:                Pre-Anesthesia Assessment:                           - Prior to the procedure, a History and Physical                            was performed, and patient medications and                            allergies were reviewed. The patient's tolerance of                            previous anesthesia was also reviewed. The risks                            and benefits of the procedure and the sedation                            options and risks were discussed with the patient.                            All questions were answered, and informed consent                            was obtained. Prior Anticoagulants: The patient has                            taken no previous anticoagulant or antiplatelet                            agents. ASA Grade Assessment: III - A patient with                            severe systemic disease. After reviewing the risks  and benefits, the patient was deemed in                            satisfactory condition to undergo the procedure.                           After obtaining informed consent, the colonoscope                            was passed under direct vision. Throughout the                            procedure, the patient's blood pressure, pulse, and                            oxygen saturations were monitored continuously. The                             Olympus CF-HQ190L [id number]) Colonoscope was                            introduced through the anus and advanced to the the                            cecum, identified by appendiceal orifice and                            ileocecal valve. The ileocecal valve, appendiceal                            orifice, and rectum were photographed. The quality                            of the bowel preparation was excellent. The                            colonoscopy was performed without difficulty. The                            patient tolerated the procedure well. The bowel                            preparation used was SUPREP via split dose                            instruction. Scope In: 2:00:33 PM Scope Out: 2:19:09 PM Scope Withdrawal Time: 0 hours 14 minutes 8 seconds  Total Procedure Duration: 0 hours 18 minutes 36 seconds  Findings:                 A single small angiodysplastic lesion without                            bleeding was found in the cecum.  Two polyps were found in the descending colon and                            ascending colon. The polyps were 2 to 3 mm in size.                            These polyps were removed with a cold snare.                            Resection and retrieval were complete.                           Scattered diverticula were found in the left colon.                            A single descending colon diverticulum was                            inflamed?"?"biopsies were taken with a cold forceps                            for histology.                           Internal hemorrhoids were found during retroflexion.                           The exam was otherwise without abnormality on                            direct and retroflexion views. Complications:            No immediate complications. Estimated blood loss:                            None. Estimated Blood Loss:     Estimated blood loss:  none. Impression:               - A single non-bleeding colonic angiodysplastic                            lesion.                           - Two 2 to 3 mm polyps in the descending colon and                            in the ascending colon, removed with a cold snare.                            Resected and retrieved.                           - Diverticulosis in the left colon. Biopsied.                           -  Internal hemorrhoids.                           - The examination was otherwise normal on direct                            and retroflexion views. Recommendation:           - Repeat colonoscopy is not recommended for                            surveillance.                           - Patient has a contact number available for                            emergencies. The signs and symptoms of potential                            delayed complications were discussed with the                            patient. Return to normal activities tomorrow.                            Written discharge instructions were provided to the                            patient.                           - Resume previous diet.                           - Continue present medications.                           - Await pathology results.                           - Do not check stool for occult blood in this                            patient                           - EGD today. Please see report Docia Chuck. Henrene Pastor, MD 09/02/2020 2:32:52 PM This report has been signed electronically.

## 2020-09-02 NOTE — Progress Notes (Signed)
Verified last medication doses with pt's wife. Pt's wife denies pt being on a blood thinner med.

## 2020-09-02 NOTE — Patient Instructions (Addendum)
Handout on polyps, diverticulosis, hemorrhoids, and esophagitis given. Await pathology results. No repeat colonoscopy needed unless something new comes up.  Prescription to Pantoprazole 40 mg daily sent to [facility] off [address].    YOU HAD AN ENDOSCOPIC PROCEDURE TODAY AT [facility]:   Refer to the procedure report that was given to you for any specific questions about what was found during the examination.  If the procedure report does not answer your questions, please call your gastroenterologist to clarify.  If you requested that your care partner not be given the details of your procedure findings, then the procedure report has been included in a sealed envelope for you to review at your convenience later.  YOU SHOULD EXPECT: Some feelings of bloating in the abdomen. Passage of more gas than usual.  Walking can help get rid of the air that was put into your GI tract during the procedure and reduce the bloating. If you had a lower endoscopy (such as a colonoscopy or flexible sigmoidoscopy) you may notice spotting of blood in your stool or on the toilet paper. If you underwent a bowel prep for your procedure, you may not have a normal bowel movement for a few days.  Please Note:  You might notice some irritation and congestion in your nose or some drainage.  This is from the oxygen used during your procedure.  There is no need for concern and it should clear up in a day or so.  SYMPTOMS TO REPORT IMMEDIATELY:  Following lower endoscopy (colonoscopy or flexible sigmoidoscopy):  Excessive amounts of blood in the stool  Significant tenderness or worsening of abdominal pains  Swelling of the abdomen that is new, acute  Fever of 100F or higher  Following upper endoscopy (EGD)  Vomiting of blood or coffee ground material  New chest pain or pain under the shoulder blades  Painful or persistently difficult swallowing  New shortness of breath  Fever of 100F or  higher  Black, tarry-looking stools  For urgent or emergent issues, a gastroenterologist can be reached at any hour by calling [phone]. Do not use MyChart messaging for urgent concerns.    DIET:  We do recommend a small meal at first, but then you may proceed to your regular diet.  Drink plenty of fluids but you should avoid alcoholic beverages for 24 hours.  ACTIVITY:  You should plan to take it easy for the rest of today and you should NOT DRIVE or use heavy machinery until tomorrow (because of the sedation medicines used during the test).    FOLLOW UP: Our staff will call the number listed on your records 48-72 hours following your procedure to check on you and address any questions or concerns that you may have regarding the information given to you following your procedure. If we do not reach you, we will leave a message.  We will attempt to reach you two times.  During this call, we will ask if you have developed any symptoms of COVID 19. If you develop any symptoms (ie: fever, flu-like symptoms, shortness of breath, cough etc.) before then, please call [phone].  If you test positive for Covid 19 in the 2 weeks post procedure, please call and report this information to [place].    If any biopsies were taken you will be contacted by phone or by letter within the next 1-3 weeks.  Please call us at [phone] if you have not heard about the biopsies in 3  weeks.    SIGNATURES/CONFIDENTIALITY: You and/or your care partner have signed paperwork which will be entered into your electronic medical record.  These signatures attest to the fact that that the information above on your After Visit Summary has been reviewed and is understood.  Full responsibility of the confidentiality of this discharge information lies with you and/or your care-partner.

## 2020-09-04 ENCOUNTER — Telehealth: Payer: Self-pay

## 2020-09-04 NOTE — Telephone Encounter (Signed)
Left message on answering machine. 

## 2020-09-10 ENCOUNTER — Encounter: Payer: Self-pay | Admitting: Internal Medicine

## 2020-09-18 ENCOUNTER — Other Ambulatory Visit: Payer: Self-pay | Admitting: Internal Medicine

## 2020-10-01 DIAGNOSIS — D692 Other nonthrombocytopenic purpura: Secondary | ICD-10-CM | POA: Diagnosis not present

## 2020-10-01 DIAGNOSIS — E785 Hyperlipidemia, unspecified: Secondary | ICD-10-CM | POA: Diagnosis not present

## 2020-10-01 DIAGNOSIS — I129 Hypertensive chronic kidney disease with stage 1 through stage 4 chronic kidney disease, or unspecified chronic kidney disease: Secondary | ICD-10-CM | POA: Diagnosis not present

## 2020-10-01 DIAGNOSIS — N401 Enlarged prostate with lower urinary tract symptoms: Secondary | ICD-10-CM | POA: Diagnosis not present

## 2020-10-01 DIAGNOSIS — N182 Chronic kidney disease, stage 2 (mild): Secondary | ICD-10-CM | POA: Diagnosis not present

## 2020-10-01 DIAGNOSIS — E039 Hypothyroidism, unspecified: Secondary | ICD-10-CM | POA: Diagnosis not present

## 2020-10-01 DIAGNOSIS — R413 Other amnesia: Secondary | ICD-10-CM | POA: Diagnosis not present

## 2020-10-01 DIAGNOSIS — Z23 Encounter for immunization: Secondary | ICD-10-CM | POA: Diagnosis not present

## 2020-10-01 DIAGNOSIS — I480 Paroxysmal atrial fibrillation: Secondary | ICD-10-CM | POA: Diagnosis not present

## 2020-10-01 DIAGNOSIS — R69 Illness, unspecified: Secondary | ICD-10-CM | POA: Diagnosis not present

## 2020-10-14 ENCOUNTER — Inpatient Hospital Stay: Payer: Medicare HMO

## 2020-10-14 ENCOUNTER — Inpatient Hospital Stay: Payer: Medicare HMO | Attending: Internal Medicine | Admitting: Internal Medicine

## 2020-10-14 ENCOUNTER — Other Ambulatory Visit: Payer: Self-pay

## 2020-10-14 VITALS — BP 149/79 | HR 72 | Temp 97.6°F | Resp 18 | Ht 66.0 in | Wt 162.9 lb

## 2020-10-14 DIAGNOSIS — D45 Polycythemia vera: Secondary | ICD-10-CM | POA: Insufficient documentation

## 2020-10-14 LAB — CBC WITH DIFFERENTIAL (CANCER CENTER ONLY)
Abs Immature Granulocytes: 0.02 10*3/uL (ref 0.00–0.07)
Basophils Absolute: 0.1 10*3/uL (ref 0.0–0.1)
Basophils Relative: 1 %
Eosinophils Absolute: 0.1 10*3/uL (ref 0.0–0.5)
Eosinophils Relative: 1 %
HCT: 51.1 % (ref 39.0–52.0)
Hemoglobin: 16.2 g/dL (ref 13.0–17.0)
Immature Granulocytes: 0 %
Lymphocytes Relative: 14 %
Lymphs Abs: 0.9 10*3/uL (ref 0.7–4.0)
MCH: 25 pg — ABNORMAL LOW (ref 26.0–34.0)
MCHC: 31.7 g/dL (ref 30.0–36.0)
MCV: 78.9 fL — ABNORMAL LOW (ref 80.0–100.0)
Monocytes Absolute: 0.5 10*3/uL (ref 0.1–1.0)
Monocytes Relative: 7 %
Neutro Abs: 5.3 10*3/uL (ref 1.7–7.7)
Neutrophils Relative %: 77 %
Platelet Count: 285 10*3/uL (ref 150–400)
RBC: 6.48 MIL/uL — ABNORMAL HIGH (ref 4.22–5.81)
RDW: 20.6 % — ABNORMAL HIGH (ref 11.5–15.5)
WBC Count: 6.9 10*3/uL (ref 4.0–10.5)
nRBC: 0 % (ref 0.0–0.2)

## 2020-10-14 LAB — CMP (CANCER CENTER ONLY)
ALT: 16 U/L (ref 0–44)
AST: 22 U/L (ref 15–41)
Albumin: 4.6 g/dL (ref 3.5–5.0)
Alkaline Phosphatase: 79 U/L (ref 38–126)
Anion gap: 10 (ref 5–15)
BUN: 10 mg/dL (ref 8–23)
CO2: 23 mmol/L (ref 22–32)
Calcium: 10.2 mg/dL (ref 8.9–10.3)
Chloride: 106 mmol/L (ref 98–111)
Creatinine: 1.3 mg/dL — ABNORMAL HIGH (ref 0.61–1.24)
GFR, Estimated: 55 mL/min — ABNORMAL LOW (ref >60)
Glucose, Bld: 95 mg/dL (ref 70–99)
Potassium: 3.9 mmol/L (ref 3.5–5.1)
Sodium: 139 mmol/L (ref 135–145)
Total Bilirubin: 1 mg/dL (ref 0.3–1.2)
Total Protein: 7.6 g/dL (ref 6.5–8.1)

## 2020-10-14 LAB — LACTATE DEHYDROGENASE: LDH: 154 U/L (ref 98–192)

## 2020-10-14 NOTE — Patient Instructions (Signed)

## 2020-10-14 NOTE — Progress Notes (Signed)
Dean Pratt presents today for phlebotomy per MD orders. 16g needle inserted in LAC.  527grams removed. IV needle removed intact.  Patient observed for 30 minutes after procedure without any incident.  Food and drink provided.  Patient tolerated procedure well.  VSS.

## 2020-10-14 NOTE — Progress Notes (Signed)
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    PRIOR THERAPY: Phlebotomy on as-needed basis  CURRENT THERAPY: Hydroxyurea 500 mg p.o. daily.  INTERVAL HISTORY: Dean Pratt 85 y.o. male returns to the clinic today for follow-up visit.  The patient is feeling fine today with no concerning complaints except for the low back pain and stiffness.  The patient denied having any chest pain, shortness of breath, cough or hemoptysis.  He denied having any fever or chills.  He has no nausea, vomiting, diarrhea or constipation.  He has no headache or visual changes.  He continues to tolerate his treatment with hydroxyurea fairly well.  He is here today for evaluation and repeat blood work.    MEDICAL HISTORY: Past Medical History:  Diagnosis Date   Anxiety    Atrial flutter ([place])    s/p afib and atrial flutter ablation 04/09/09   Blood transfusion without reported diagnosis    BPH (benign prostatic hyperplasia)    Cancer (HCC)    basal cell CA removed   Cataract    removed with lens implants    CHF (congestive heart failure) (HCC)    Colitis, ischemic ([facility])    secondary to ebolism from afib 1/10   Constipation    GERD (gastroesophageal reflux disease)    Gout    HTN (hypertension)    Hyperlipidemia    Intracranial bleed ([place]) 04/10/2010   Right occipital hematoma with a left homonymous hemianopsia    Migraines    Paroxysmal atrial fibrillation ([place])    s/p PVI 04/09/09 and 10/17/10   Rosacea    Stroke ([place]) 04/08/2010   hemorragic, anticoagulation stopped at that time   Tuberculosis    s/p treatment 1964    ALLERGIES:  is allergic to ace inhibitors and warfarin and related.  MEDICATIONS:  Current Outpatient Medications  Medication Sig Dispense Refill   acetaminophen (TYLENOL) 325 MG tablet Take 650 mg by  mouth every 6 (six) hours as needed.       allopurinol (ZYLOPRIM) 100 MG tablet Take 100 mg by mouth at bedtime.       Ascorbic Acid (VITAMIN C) 1000 MG tablet Take 1,000 mg by mouth daily.      aspirin 81 MG tablet Take 81 mg by mouth daily.       cycloSPORINE (RESTASIS) 0.05 % ophthalmic emulsion Place 1 drop into both eyes as needed.     diclofenac Sodium (VOLTAREN) 1 % GEL Apply 2-4 g topically 4 (four) times daily. 200 g 3   diltiazem (CARDIZEM CD) 240 MG 24 hr capsule TAKE 1 CAPSULE DAILY. PLEASE CALL OFFICE TO SCHEDULE APPOINTMENT FOR FURTHER REFILLS 15 capsule 0   docusate sodium (COLACE) 100 MG capsule Take 1 capsule by mouth as needed.     donepezil (ARICEPT) 5 MG tablet Take 5 mg by mouth at bedtime.     famotidine (PEPCID) 20 MG tablet Take 20 mg by mouth at bedtime as needed.     fluticasone (CUTIVATE) 0.05 % cream Apply 1 application topically daily.     hydroxyurea (HYDREA) 500 MG capsule TAKE 1 CAPSULE (500 MG TOTAL) BY MOUTH DAILY. MAY TAKE WITH FOOD TO MINIMIZE GI SIDE EFFECTS. 90 capsule 1   ibuprofen (ADVIL,MOTRIN) 200 MG tablet Take 200 mg by mouth every 6 (six) hours as needed. (Patient  not taking: Reported on 09/02/2020)     LORazepam (ATIVAN) 1 MG tablet TAKE 1/2-1 TABLET TWICE DAILY AS NEEDED  3   losartan (COZAAR) 25 MG tablet Take 1 tablet (25 mg total) by mouth daily. Please make yearly appt with Dr. Rayann Heman for October 2022 for future refills. Thank you 1st attempt 90 tablet 0   methocarbamol (ROBAXIN) 500 MG tablet Take 1 tablet (500 mg total) by mouth every 8 (eight) hours as needed for muscle spasms. (Patient not taking: Reported on 09/02/2020) 60 tablet 2   NON FORMULARY Stool softener 100 mg... Take 1 capsule by mouth once daily.      Olopatadine HCl 0.2 % SOLN INSTILL 1 DROP INTO BOTH EYES TWICE A DAY     pantoprazole (PROTONIX) 40 MG tablet Take 1 tablet (40 mg total) by mouth daily. 90 tablet 3   rosuvastatin (CRESTOR) 10 MG tablet Take 10 mg by mouth daily.  2    No current facility-administered medications for this visit.    SURGICAL HISTORY:  Past Surgical History:  Procedure Laterality Date   ablation  04/09/2009   s/p afib and atrial flutter ablation by [facility]   ABLATION OF DYSRHYTHMIC FOCUS  10/15/09   repeat afib ablation by [facility]   APPENDECTOMY     CATARACT EXTRACTION     CATARACT EXTRACTION, BILATERAL     with lens implants    COLONOSCOPY     CORNEAL LACERATION REPAIR     EYE SURGERY     INGUINAL HERNIA REPAIR     POLYPECTOMY     TONSILLECTOMY     VASECTOMY      REVIEW OF SYSTEMS:  A comprehensive review of systems was negative except for: Constitutional: positive for fatigue   PHYSICAL EXAMINATION: General appearance: alert, cooperative, fatigued, and no distress Head: Normocephalic, without obvious abnormality, atraumatic Neck: no adenopathy, no JVD, supple, symmetrical, trachea midline, and thyroid not enlarged, symmetric, no tenderness/mass/nodules Lymph nodes: Cervical, supraclavicular, and axillary nodes normal. Resp: clear to auscultation bilaterally Back: symmetric, no curvature. ROM normal. No CVA tenderness. Cardio: regular rate and rhythm, S1, S2 normal, no murmur, click, rub or gallop GI: soft, non-tender; bowel sounds normal; no masses,  no organomegaly Extremities: extremities normal, atraumatic, no cyanosis or edema  ECOG PERFORMANCE STATUS: 1 - Symptomatic but completely ambulatory  Blood pressure (!) 149/79, pulse 72, temperature 97.6 F (36.4 C), temperature source Tympanic, resp. rate 18, height 5\' 6"  (1.676 m), weight 162 lb 14.4 oz (73.9 kg), SpO2 99 %.  LABORATORY DATA: Lab Results  Component Value Date   WBC 6.9 10/14/2020   HGB 16.2 10/14/2020   HCT 51.1 10/14/2020   MCV 78.9 (L) 10/14/2020   PLT 285 10/14/2020      Chemistry      Component Value Date/Time   NA 139 10/14/2020 1050   NA 143 10/18/2019 0836   K 3.9 10/14/2020 1050   CL 106 10/14/2020 1050   CO2 23 10/14/2020 1050   BUN 10  10/14/2020 1050   BUN 12 10/18/2019 0836   CREATININE 1.30 (H) 10/14/2020 1050      Component Value Date/Time   CALCIUM 10.2 10/14/2020 1050   ALKPHOS 79 10/14/2020 1050   AST 22 10/14/2020 1050   ALT 16 10/14/2020 1050   BILITOT 1.0 10/14/2020 1050       RADIOGRAPHIC STUDIES: No results found.  ASSESSMENT AND PLAN: This is a very pleasant 85 years old white male with recently diagnosed polycythemia vera with positive  Jak 2 mutation.  The patient is also currently on hormonal therapy for androgen deficiency. He underwent phlebotomy on as-needed basis in the past.  He also had essential thrombocythemia. The patient is started treatment with hydroxyurea 500 mg p.o. daily and has been tolerating it fairly well. The patient is feeling fine today with no concerning complaints. Repeat CBC showed hematocrit of 51.1%. I recommended for the patient to consider phlebotomy today in addition to his current treatment with hydroxyurea.  He is in agreement with the current plan. I will see him back for follow-up visit in 2 months for evaluation and repeat blood work. He was advised to call immediately if he has any other concerning symptoms in the interval. The patient voices understanding of current disease status and treatment options and is in agreement with the current care plan. All questions were answered. The patient knows to call the clinic with any problems, questions or concerns. We can certainly see the patient much sooner if necessary.  Disclaimer: This note was dictated with voice recognition software. Similar sounding words can inadvertently be transcribed and may not be corrected upon review.

## 2020-10-22 DIAGNOSIS — C44319 Basal cell carcinoma of skin of other parts of face: Secondary | ICD-10-CM | POA: Diagnosis not present

## 2020-10-22 DIAGNOSIS — Z08 Encounter for follow-up examination after completed treatment for malignant neoplasm: Secondary | ICD-10-CM | POA: Diagnosis not present

## 2020-10-22 DIAGNOSIS — Z85828 Personal history of other malignant neoplasm of skin: Secondary | ICD-10-CM | POA: Diagnosis not present

## 2020-10-22 DIAGNOSIS — L570 Actinic keratosis: Secondary | ICD-10-CM | POA: Diagnosis not present

## 2020-10-22 DIAGNOSIS — X32XXXD Exposure to sunlight, subsequent encounter: Secondary | ICD-10-CM | POA: Diagnosis not present

## 2020-10-22 DIAGNOSIS — C4441 Basal cell carcinoma of skin of scalp and neck: Secondary | ICD-10-CM | POA: Diagnosis not present

## 2020-11-26 ENCOUNTER — Ambulatory Visit: Payer: Self-pay | Admitting: Surgery

## 2020-11-26 DIAGNOSIS — M7989 Other specified soft tissue disorders: Secondary | ICD-10-CM | POA: Diagnosis not present

## 2020-11-27 ENCOUNTER — Telehealth: Payer: Self-pay

## 2020-11-27 NOTE — Telephone Encounter (Signed)
   Name: [name]  DOB: [birth year]  MRN: [id number]  Primary Cardiologist: Dr. Rayann Heman  Chart reviewed as part of pre-operative protocol coverage. Patient has not been seen in over 1 year. Because of [name]'s past medical history and time since last visit, he will require a follow-up visit in order to better assess preoperative cardiovascular risk.  Pre-op covering staff: - Please schedule appointment and call patient to inform them. If patient already had an upcoming appointment within acceptable timeframe, please add "pre-op clearance" to the appointment notes so provider is aware. - Please contact requesting surgeon's office via preferred method (i.e, phone, fax) to inform them of need for appointment prior to surgery.   Darreld Mclean, PA-C  11/27/2020, 10:41 AM '

## 2020-11-27 NOTE — Telephone Encounter (Signed)
Contacted patient and scheduled a follow up with Tommye Standard, PA, on 12/05/20 at 1:55pm. I advised patient to arrive fifteen minutes prior to appointment. Patient voiced understanding.

## 2020-11-27 NOTE — Telephone Encounter (Signed)
   [place] [place] Pre-operative Risk Assessment    Patient Name: Dean Pratt  DOB: 09-22-1935 MRN: [id number]  HEARTCARE STAFF:  - IMPORTANT!!!!!! Under Visit Info/Reason for Call, type in Other and utilize the format Clearance MM/DD/YY or Clearance TBD. Do not use dashes or single digits. - Please review there is not already an duplicate clearance open for this procedure. - If request is for dental extraction, please clarify the # of teeth to be extracted. - If the patient is currently at the dentist's office, call Pre-Op Callback Staff (MA/nurse) to input urgent request.  - If the patient is not currently in the dentist office, please route to the Pre-Op pool.  Request for surgical clearance:  What type of surgery is being performed? EXCISION OF SOFT TISSUE MASS LEFT FLANK  When is this surgery scheduled? TBD  What type of clearance is required (medical clearance vs. Pharmacy clearance to hold med vs. Both)? BOTH  Are there any medications that need to be held prior to surgery and how long? Aspirin  Practice name and name of physician performing surgery? [facility]              Dr Armandina Gemma  What is the office phone number? [place], LPN   7.   What is the office fax number? [phone]  8.   Anesthesia type (None, local, MAC, general) ? MAC   Dean Pratt, Dean Pratt 11/27/2020, 10:10 AM  _________________________________________________________________   (provider comments below)

## 2020-12-03 DIAGNOSIS — Z85828 Personal history of other malignant neoplasm of skin: Secondary | ICD-10-CM | POA: Diagnosis not present

## 2020-12-03 DIAGNOSIS — Z08 Encounter for follow-up examination after completed treatment for malignant neoplasm: Secondary | ICD-10-CM | POA: Diagnosis not present

## 2020-12-03 DIAGNOSIS — C4441 Basal cell carcinoma of skin of scalp and neck: Secondary | ICD-10-CM | POA: Diagnosis not present

## 2020-12-04 NOTE — Progress Notes (Signed)
Cardiology Office Note Date:  12/04/2020  Patient ID:  Dean Pratt 1935-07-09, MRN [id number] PCP:  [name], MD  Cardiologist:  Dr. Agustin Cree (2019) Electrophysiologist: Dr. Rayann Heman    Chief Complaint: pre-op  History of Present Illness: Dean Pratt is a 85 y.o. 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 Rayann Heman, last seen by him via tele health visit Jan 2021.  The patient was feeling well, Afib described as persistent, rate controlled and asymptomatic.  Give hx of ICH on warfarin, opted to keep off a/c.  Might consider LAAO in the future. Planned for an annual visit  Pending: EXCISION OF SOFT TISSUE MASS LEFT FLANK, requesting clearance to hold ASa aas well as cardiac clearance   TODAY He comes with his wife Feels very well. He walks and rides a bike for exercise, most every day weather permitting Has been riding bike for many years, usually 1-3 miles daily, he will participate in events as well He feels like his exertional capacity is unchanged and has no difficulties with his ADLs He denies any kind of CP, palpitations or cardiac awareness No SOB, DOE No unusual fatigue, weakness or symptoms of bradycardia  RCRI score is zero, 0.4% risk   AFib hx Afib/flutter diagnosed +/- 2005 afib and atrial flutter ablation 04/09/09 Redo Ablation 10/16/2009 AAD  Flecainide stopped after redo ablation 2011   Past Medical History:  Diagnosis Date   Anxiety    Atrial flutter ([place])    s/p afib and atrial flutter ablation 04/09/09   Blood transfusion without reported diagnosis    BPH (benign prostatic hyperplasia)    Cancer (HCC)    basal cell CA removed   Cataract    removed with lens implants    CHF (congestive heart failure) ([place])     Colitis, ischemic ([place])    secondary to ebolism from afib 1/10   Constipation    GERD (gastroesophageal reflux disease)    Gout    HTN (hypertension)    Hyperlipidemia    Intracranial bleed ([place]) 04/10/2010   Right occipital hematoma with a left homonymous hemianopsia    Migraines    Paroxysmal atrial fibrillation ([place])    s/p PVI 04/09/09 and 10/17/10   Rosacea    Stroke ([place]) 04/08/2010   hemorragic, anticoagulation stopped at that time   Tuberculosis    s/p treatment 1964    Past Surgical History:  Procedure Laterality Date   ablation  04/09/2009   s/p afib and atrial flutter ablation by JA   ABLATION OF DYSRHYTHMIC FOCUS  10/15/09   repeat afib ablation by JA   APPENDECTOMY     CATARACT EXTRACTION     CATARACT EXTRACTION, BILATERAL     with lens implants    COLONOSCOPY     CORNEAL LACERATION REPAIR     EYE SURGERY     INGUINAL HERNIA REPAIR     POLYPECTOMY     TONSILLECTOMY     VASECTOMY      Current Outpatient Medications  Medication Sig Dispense Refill   acetaminophen (TYLENOL) 325 MG tablet Take 650 mg by mouth every 6 (six) hours as needed.       allopurinol (ZYLOPRIM) 100 MG tablet Take 100 mg by mouth  at bedtime.       Ascorbic Acid (VITAMIN C) 1000 MG tablet Take 1,000 mg by mouth daily.      aspirin 81 MG tablet Take 81 mg by mouth daily.       cycloSPORINE (RESTASIS) 0.05 % ophthalmic emulsion Place 1 drop into both eyes as needed.     diclofenac Sodium (VOLTAREN) 1 % GEL Apply 2-4 g topically 4 (four) times daily. 200 g 3   diltiazem (CARDIZEM CD) 240 MG 24 hr capsule TAKE 1 CAPSULE DAILY. PLEASE CALL OFFICE TO SCHEDULE APPOINTMENT FOR FURTHER REFILLS 15 capsule 0   docusate sodium (COLACE) 100 MG capsule Take 1 capsule by mouth as needed.     donepezil (ARICEPT) 5 MG tablet Take 5 mg by mouth at bedtime.     famotidine (PEPCID) 20 MG tablet Take 20 mg by mouth at bedtime as needed.     fluticasone (CUTIVATE) 0.05 % cream Apply 1 application topically  daily.     hydroxyurea (HYDREA) 500 MG capsule TAKE 1 CAPSULE (500 MG TOTAL) BY MOUTH DAILY. MAY TAKE WITH FOOD TO MINIMIZE GI SIDE EFFECTS. 90 capsule 1   ibuprofen (ADVIL,MOTRIN) 200 MG tablet Take 200 mg by mouth every 6 (six) hours as needed. (Patient not taking: Reported on 09/02/2020)     LORazepam (ATIVAN) 1 MG tablet TAKE 1/2-1 TABLET TWICE DAILY AS NEEDED  3   losartan (COZAAR) 25 MG tablet Take 1 tablet (25 mg total) by mouth daily. Please make yearly appt with Dr. Rayann Heman for October 2022 for future refills. Thank you 1st attempt 90 tablet 0   methocarbamol (ROBAXIN) 500 MG tablet Take 1 tablet (500 mg total) by mouth every 8 (eight) hours as needed for muscle spasms. (Patient not taking: Reported on 09/02/2020) 60 tablet 2   NON FORMULARY Stool softener 100 mg... Take 1 capsule by mouth once daily.      Olopatadine HCl 0.2 % SOLN INSTILL 1 DROP INTO BOTH EYES TWICE A DAY     pantoprazole (PROTONIX) 40 MG tablet Take 1 tablet (40 mg total) by mouth daily. 90 tablet 3   rosuvastatin (CRESTOR) 10 MG tablet Take 10 mg by mouth daily.  2   No current facility-administered medications for this visit.    Allergies:   Ace inhibitors and Warfarin and related   Social History:  The patient  reports that he has quit smoking. He has never used smokeless tobacco. He reports that he does not drink alcohol and does not use drugs.   Family History:  The patient's family history includes Breast cancer in his sister; COPD in his mother; Coronary artery disease in his father; Liver cancer in his father; Prostate cancer in his father.  ROS:  Please see the history of present illness.    All other systems are reviewed and otherwise negative.   PHYSICAL EXAM:  VS:  There were no vitals taken for this visit. BMI: There is no height or weight on file to calculate BMI. Well nourished, well developed, in no acute distress HEENT: normocephalic, atraumatic Neck: no JVD, carotid bruits or masses Cardiac:   irreg-irreg; no significant murmurs, no rubs, or gallops Lungs:  CTA b/l, no wheezing, rhonchi or rales Abd: soft, nontender MS: no deformity or atrophy Ext: no edema Skin: warm and dry, no rash Neuro:  No gross deficits appreciated Psych: euthymic mood, full affect    EKG:  Done today and reviewed by myself shows  AFib 59bpm, PVCs (2)  12/30/2017:  TTE Study Conclusions  - Left ventricle: The cavity size was normal. Wall thickness was    normal. Systolic function was normal. The estimated ejection    fraction was in the range of 60% to 65%. Wall motion was normal;    there were no regional wall motion abnormalities.  - Mitral valve: There was moderate regurgitation.  - Right ventricle: Systolic function was mildly reduced.   Recent Labs: 10/14/2020: ALT 16; BUN 10; Creatinine 1.30; Hemoglobin 16.2; Platelet Count 285; Potassium 3.9; Sodium 139  No results found for requested labs within last 8760 hours.   CrCl cannot be calculated (Patient's most recent lab result is older than the maximum 21 days allowed.).   Wt Readings from Last 3 Encounters:  10/14/20 162 lb 14.4 oz (73.9 kg)  09/02/20 159 lb (72.1 kg)  08/12/20 161 lb 3.2 oz (73.1 kg)     Other studies reviewed: Additional studies/records reviewed today include: summarized above  ASSESSMENT AND PLAN:  Longstanding persistent Afib CHA2DS2Vasc is 3, not on a/c as discussed above Asymptomatic No symptoms of bradycardia  Pre-op Low risk surgery Low cardiac risk score He has no cardiac indication for ASA, so no objection to holding it from our perspective No cardiac contraindication to him having the surgery/procedure planned  Disposition: F/u with [place] in a year, sooner if needed  Current medicines are reviewed at length with the patient today.  The patient did not have any concerns regarding medicines.  Venetia Night, PA-C 12/04/2020 6:29 PM     [address] [place] [address] [phone] (office)  [phone] (fax)

## 2020-12-05 ENCOUNTER — Other Ambulatory Visit: Payer: Self-pay

## 2020-12-05 ENCOUNTER — Encounter: Payer: Self-pay | Admitting: Physician Assistant

## 2020-12-05 ENCOUNTER — Ambulatory Visit: Payer: Medicare HMO | Admitting: Physician Assistant

## 2020-12-05 VITALS — BP 120/62 | HR 59 | Ht 66.0 in | Wt 161.0 lb

## 2020-12-05 DIAGNOSIS — Z01818 Encounter for other preprocedural examination: Secondary | ICD-10-CM

## 2020-12-05 DIAGNOSIS — I4811 Longstanding persistent atrial fibrillation: Secondary | ICD-10-CM | POA: Diagnosis not present

## 2020-12-05 NOTE — Patient Instructions (Signed)
Medication Instructions:   Your physician recommends that you continue on your current medications as directed. Please refer to the Current Medication list given to you today.   *If you need a refill on your cardiac medications before your next appointment, please call your pharmacy*   Lab Work: [place]    If you have labs (blood work) drawn today and your tests are completely normal, you will receive your results only by: [place] (if you have MyChart) OR A paper copy in the mail If you have any lab test that is abnormal or we need to change your treatment, we will call you to review the results.   Testing/Procedures: NONE ORDERED  TODAY    Follow-Up: At [facility], you and your health needs are our priority.  As part of our continuing mission to provide you with exceptional heart care, we have created designated Provider Care Teams.  These Care Teams include your primary Cardiologist (physician) and Advanced Practice Providers (APPs -  Physician Assistants and Nurse Practitioners) who all work together to provide you with the care you need, when you need it.  We recommend signing up for the patient portal called "MyChart".  Sign up information is provided on this After Visit Summary.  MyChart is used to connect with patients for Virtual Visits (Telemedicine).  Patients are able to view lab/test results, encounter notes, upcoming appointments, etc.  Non-urgent messages can be sent to your provider as well.   To learn more about what you can do with MyChart, go to [email or web address].    Your next appointment:    1 year(s)  The format for your next appointment:   In Person  Provider:    Tommye Standard, PA-C   :1}    Other Instructions

## 2020-12-15 ENCOUNTER — Encounter (HOSPITAL_BASED_OUTPATIENT_CLINIC_OR_DEPARTMENT_OTHER): Payer: Self-pay | Admitting: Surgery

## 2020-12-15 DIAGNOSIS — M7989 Other specified soft tissue disorders: Secondary | ICD-10-CM | POA: Diagnosis present

## 2020-12-15 NOTE — H&P (Signed)
REFERRING PHYSICIAN: Nelta Numbers.,*  PROVIDER: Gomez Cleverly, MD  DOB: 09-Dec-1935 DATE OF ENCOUNTER: 11/26/2020  Chief Complaint: New Consultation (Soft tissue mass left flank)   History of Present Illness:  Patient is referred by Dr. Allyn Kenner, Brooke Bonito. for surgical evaluation of soft tissue mass of the back. Patient states that he has had masses on the back present for a number of years. They have gradually become larger. He has now developed pain in the lower back and radiating into the right posterior thigh. He presents today for surgical evaluation. Patient has had no such lesions removed in the past.  Review of Systems: A complete review of systems was obtained from the patient. I have reviewed this information and discussed as appropriate with the patient. See HPI as well for other ROS.  Review of Systems  Constitutional: Negative.  HENT: Negative.  Eyes: Negative.  Respiratory: Negative.  Cardiovascular: Negative.  Gastrointestinal: Negative.  Genitourinary: Negative.  Musculoskeletal: Positive for back pain.  Skin: Negative.  Neurological: Negative.  Endo/Heme/Allergies: Negative.  Psychiatric/Behavioral: Negative.    Medical History: Past Medical History:  Diagnosis Date   History of stroke   Patient Active Problem List  Diagnosis   Soft tissue mass   History reviewed. No pertinent surgical history.   No Known Allergies  Current Outpatient Medications on File Prior to Visit  Medication Sig Dispense Refill   diclofenac (VOLTAREN) 1 % topical gel Apply topically   methocarbamoL (ROBAXIN) 500 MG tablet Take by mouth   allopurinoL (ZYLOPRIM) 100 MG tablet Take 1 tablet (100 mg total) by mouth once daily   ascorbic acid, vitamin C, (VITAMIN C) 1000 MG tablet Take by mouth   aspirin 81 MG EC tablet Take 1 tablet (81 mg total) by mouth once daily   diltiazem (CARDIZEM CD) 240 MG CD capsule Take 1 capsule (240 mg total) by mouth once daily    donepeziL (ARICEPT) 5 MG tablet TAKE 1 TABLET BY MOUTH NIGHTLY 90   famotidine (PEPCID) 20 MG tablet TAKE 1 TABLET BY MOUTH EVERY DAY AT BEDTIME AS NEEDED FOR 90 DAYS   hydroxyurea (HYDREA) 500 mg capsule   losartan (COZAAR) 25 MG tablet TAKE 1 TABLET DAILY. PLEASE MAKE YEARLY APPT WITH DR. ALLRED FOR OCTOBER 2022 FOR FUTURE REFILLS   pantoprazole (PROTONIX) 40 MG DR tablet Take 1 tablet (40 mg total) by mouth once daily   rosuvastatin (CRESTOR) 10 MG tablet   No current facility-administered medications on file prior to visit.   History reviewed. No pertinent family history.   Social History   Tobacco Use  Smoking Status Former   Types: Cigarettes  Smokeless Tobacco Never    Social History   Socioeconomic History   Marital status: Married  Tobacco Use   Smoking status: Former  Types: Cigarettes   Smokeless tobacco: Never  Vaping Use   Vaping Use: Unknown  Substance and Sexual Activity   Alcohol use: Never   Drug use: Never   Objective:   Vitals:  11/26/20 1551  BP: 124/78  Pulse: 71  Temp: 36.6 C (97.8 F)  SpO2: 97%  Weight: 74.1 kg (163 lb 6.4 oz)  Height: 179.1 cm (5' 10.5")   Body mass index is 23.11 kg/m.  Physical Exam   GENERAL APPEARANCE Development: normal Nutritional status: normal Gross deformities: none  SKIN Rash, lesions, ulcers: none Induration, erythema: none Nodules: none palpable In the left costovertebral angle there is a 7 x 5 x 3 cm  subcutaneous soft tissue mass which is mobile and well-defined consistent with a lipoma. It is mildly tender. Palpation on the right side from the costal margin down to the gluteus reveals no other significant masses.  EYES Conjunctiva and lids: normal Pupils: equal and reactive Iris: normal bilaterally  EARS, NOSE, MOUTH, THROAT External ears: no lesion or deformity External nose: no lesion or deformity Hearing: grossly normal Due to Covid-19 pandemic, patient is wearing a  mask.  NECK Symmetric: yes Trachea: midline Thyroid: no palpable nodules in the thyroid bed  CHEST Respiratory effort: normal Retraction or accessory muscle use: no Breath sounds: normal bilaterally Rales, rhonchi, wheeze: none  CARDIOVASCULAR Auscultation: regular rhythm, normal rate Murmurs: none Pulses: radial pulse 2+ palpable Lower extremity edema: Mild bilateral  MUSCULOSKELETAL Station and gait: normal Digits and nails: no clubbing or cyanosis Muscle strength: grossly normal all extremities Range of motion: grossly normal all extremities Deformity: none  LYMPHATIC Cervical: none palpable Supraclavicular: none palpable  PSYCHIATRIC Oriented to person, place, and time: yes Mood and affect: normal for situation Judgment and insight: appropriate for situation  Assessment and Plan:   Soft tissue mass   Patient is referred by his dermatologist, Dr. Allyn Kenner, Bro[place] Bonito., for surgical evaluation of a soft tissue mass in the left posterior flank. This has been present for a number of years and gradually increasing in size. It is causing the patient discomfort. He presents today to discuss surgical excision.  Patient also complains of right flank pain. He describes discomfort over the ribs inferiorly on the back, in the costovertebral angle, and radiating into the right upper buttock and lateral thigh. On examination the patient has obvious significant curvature of the spine. I can find no palpable abnormality of the skin or subcutaneous tissues on the right side. I suspect that the pain he is experiencing may be more related to spinal pathology than to any type of subcutaneous mass. I have told him to address this with his primary care physician, Dr. [name], and that he may require additional imaging studies of the spine to discover the cause of his pain.  Patient would like to proceed with excision of the mass on the left side of the lower back. I think this can be done as an  outpatient procedure under sedation and local anesthesia. We will request cardiac clearance. We will make arrangements for surgical excision at some point in the near future when it is convenient for the patient.   Armandina Gemma, MD [facility] [place] practice Office: [phone]

## 2020-12-16 ENCOUNTER — Other Ambulatory Visit: Payer: Self-pay

## 2020-12-16 ENCOUNTER — Encounter (HOSPITAL_BASED_OUTPATIENT_CLINIC_OR_DEPARTMENT_OTHER): Payer: Self-pay | Admitting: Surgery

## 2020-12-16 NOTE — Progress Notes (Signed)
Spoke w/ via phone for pre-op interview--- [place]----    NONE           Lab results------ COVID test -----patient states asymptomatic no test needed Arrive at ------- 0800 NPO after MN NO Solid Food.  Clear liquids from MN until--- 0700 Med rec completed Medications to take morning of surgery ----- Cardizem and Protonix. Diabetic medication ----- Patient instructed no nail polish to be worn day of surgery Patient instructed to bring photo id and insurance card day of surgery Patient aware to have Driver (ride ) / caregiver    for 24 hours after surgery  Patient Special Instructions ----- Pre-Op special Istructions ----- Patient verbalized understanding of instructions that were given at this phone interview. Patient denies shortness of breath, chest pain, fever, cough at this phone interview. Anesthesia Review:  PCP: Virgina Jock, MD Cardiologist :Rayann Heman, MD-Clearance in Epic, dated 12/2020. Chest x-ray :  EKG :12/2020-Epic Echo :2019-Epic EF 60-65% Stress test: Cardiac Cath : 2009 Activity level:  Sleep Study/ CPAP :n/a Fasting Blood Sugar :      / Checks Blood Sugar -- times a day:   Blood Thinner/ Instructions /Last Dose: ASA / Instructions/ Last Dose :  Ok to stop ASA preop per cardiac clearance.

## 2020-12-19 ENCOUNTER — Other Ambulatory Visit: Payer: Self-pay | Admitting: Internal Medicine

## 2020-12-20 ENCOUNTER — Ambulatory Visit (HOSPITAL_BASED_OUTPATIENT_CLINIC_OR_DEPARTMENT_OTHER): Payer: Medicare HMO | Admitting: Anesthesiology

## 2020-12-20 ENCOUNTER — Encounter (HOSPITAL_BASED_OUTPATIENT_CLINIC_OR_DEPARTMENT_OTHER)
Admission: RE | Disposition: A | Discharge status: Home / Self Care | Payer: Self-pay | Source: Home / Self Care | Attending: Surgery

## 2020-12-20 ENCOUNTER — Ambulatory Visit (HOSPITAL_BASED_OUTPATIENT_CLINIC_OR_DEPARTMENT_OTHER)
Admission: RE | Admit: 2020-12-20 | Discharge: 2020-12-20 | Disposition: A | Discharge status: Home / Self Care | Payer: Medicare HMO | Attending: Surgery | Admitting: Surgery

## 2020-12-20 ENCOUNTER — Encounter (HOSPITAL_BASED_OUTPATIENT_CLINIC_OR_DEPARTMENT_OTHER): Payer: Self-pay | Admitting: Surgery

## 2020-12-20 DIAGNOSIS — M7989 Other specified soft tissue disorders: Secondary | ICD-10-CM

## 2020-12-20 DIAGNOSIS — R222 Localized swelling, mass and lump, trunk: Secondary | ICD-10-CM | POA: Diagnosis not present

## 2020-12-20 DIAGNOSIS — I509 Heart failure, unspecified: Secondary | ICD-10-CM | POA: Insufficient documentation

## 2020-12-20 DIAGNOSIS — Z8673 Personal history of transient ischemic attack (TIA), and cerebral infarction without residual deficits: Secondary | ICD-10-CM | POA: Insufficient documentation

## 2020-12-20 DIAGNOSIS — E785 Hyperlipidemia, unspecified: Secondary | ICD-10-CM | POA: Diagnosis not present

## 2020-12-20 DIAGNOSIS — I4891 Unspecified atrial fibrillation: Secondary | ICD-10-CM | POA: Diagnosis not present

## 2020-12-20 DIAGNOSIS — Z87891 Personal history of nicotine dependence: Secondary | ICD-10-CM | POA: Diagnosis not present

## 2020-12-20 DIAGNOSIS — D171 Benign lipomatous neoplasm of skin and subcutaneous tissue of trunk: Secondary | ICD-10-CM | POA: Diagnosis not present

## 2020-12-20 DIAGNOSIS — I110 Hypertensive heart disease with heart failure: Secondary | ICD-10-CM | POA: Insufficient documentation

## 2020-12-20 DIAGNOSIS — K219 Gastro-esophageal reflux disease without esophagitis: Secondary | ICD-10-CM | POA: Diagnosis not present

## 2020-12-20 DIAGNOSIS — Z79899 Other long term (current) drug therapy: Secondary | ICD-10-CM | POA: Diagnosis not present

## 2020-12-20 HISTORY — PX: MASS EXCISION: SHX2000

## 2020-12-20 SURGERY — EXCISION MASS
Anesthesia: Monitor Anesthesia Care | Site: Flank | Laterality: Left

## 2020-12-20 MED ORDER — CHLORHEXIDINE GLUCONATE CLOTH 2 % EX PADS: 6.0000 | MEDICATED_PAD | Freq: Once | CUTANEOUS | Status: DC

## 2020-12-20 MED ORDER — CEFAZOLIN SODIUM-DEXTROSE 2-4 GM/100ML-% IV SOLN
INTRAVENOUS | Status: AC
  Filled 2020-12-20: qty 100

## 2020-12-20 MED ORDER — LACTATED RINGERS IV SOLN: INTRAVENOUS | Status: DC

## 2020-12-20 MED ORDER — ACETAMINOPHEN 500 MG PO TABS
ORAL_TABLET | ORAL | Status: AC
  Filled 2020-12-20: qty 2

## 2020-12-20 MED ORDER — FENTANYL CITRATE (PF) 100 MCG/2ML IJ SOLN
INTRAMUSCULAR | Status: AC
  Filled 2020-12-20: qty 2

## 2020-12-20 MED ORDER — PROPOFOL 500 MG/50ML IV EMUL
INTRAVENOUS | Status: DC | PRN
  Administered 2020-12-20: 75 ug/kg/min via INTRAVENOUS

## 2020-12-20 MED ORDER — ONDANSETRON HCL 4 MG/2ML IJ SOLN
INTRAMUSCULAR | Status: DC | PRN
  Administered 2020-12-20: 4 mg via INTRAVENOUS

## 2020-12-20 MED ORDER — PROPOFOL 1000 MG/100ML IV EMUL
INTRAVENOUS | Status: AC
  Filled 2020-12-20: qty 100

## 2020-12-20 MED ORDER — PROPOFOL 10 MG/ML IV BOLUS
INTRAVENOUS | Status: DC | PRN
  Administered 2020-12-20: 10 mg via INTRAVENOUS
  Administered 2020-12-20: 20 mg via INTRAVENOUS

## 2020-12-20 MED ORDER — CEFAZOLIN SODIUM-DEXTROSE 2-4 GM/100ML-% IV SOLN
2.0000 g | INTRAVENOUS | Status: AC
  Administered 2020-12-20: 2 g via INTRAVENOUS

## 2020-12-20 MED ORDER — 0.9 % SODIUM CHLORIDE (POUR BTL) OPTIME
TOPICAL | Status: DC | PRN
  Administered 2020-12-20: 1000 mL

## 2020-12-20 MED ORDER — FENTANYL CITRATE (PF) 100 MCG/2ML IJ SOLN
INTRAMUSCULAR | Status: DC | PRN
  Administered 2020-12-20 (×2): 25 ug via INTRAVENOUS
  Administered 2020-12-20: 50 ug via INTRAVENOUS

## 2020-12-20 MED ORDER — TRAMADOL HCL 50 MG PO TABS: 50.0000 mg | ORAL_TABLET | Freq: Four times a day (QID) | ORAL | 0 refills | Status: DC | PRN

## 2020-12-20 MED ORDER — BUPIVACAINE-EPINEPHRINE 0.5% -1:200000 IJ SOLN
INTRAMUSCULAR | Status: AC
  Filled 2020-12-20: qty 1

## 2020-12-20 MED ORDER — BUPIVACAINE HCL (PF) 0.5 % IJ SOLN
INTRAMUSCULAR | Status: AC
  Filled 2020-12-20: qty 30

## 2020-12-20 MED ORDER — ACETAMINOPHEN 500 MG PO TABS
1000.0000 mg | ORAL_TABLET | Freq: Once | ORAL | Status: AC
  Administered 2020-12-20: 1000 mg via ORAL

## 2020-12-20 MED ORDER — LIDOCAINE HCL 1 % IJ SOLN
INTRAMUSCULAR | Status: AC
  Filled 2020-12-20: qty 20

## 2020-12-20 MED ORDER — EPHEDRINE SULFATE-NACL 50-0.9 MG/10ML-% IV SOSY
PREFILLED_SYRINGE | INTRAVENOUS | Status: DC | PRN
  Administered 2020-12-20: 10 mg via INTRAVENOUS
  Administered 2020-12-20: 5 mg via INTRAVENOUS
  Administered 2020-12-20: 10 mg via INTRAVENOUS

## 2020-12-20 MED ORDER — BUPIVACAINE HCL 0.5 % IJ SOLN
INTRAMUSCULAR | Status: DC | PRN
  Administered 2020-12-20: 16 mL

## 2020-12-20 MED ORDER — ONDANSETRON HCL 4 MG/2ML IJ SOLN
INTRAMUSCULAR | Status: AC
  Filled 2020-12-20: qty 2

## 2020-12-20 MED ORDER — FENTANYL CITRATE (PF) 100 MCG/2ML IJ SOLN: 25.0000 ug | INTRAMUSCULAR | Status: DC | PRN

## 2020-12-20 SURGICAL SUPPLY — 38 items
APL PRP STRL LF DISP 70% ISPRP (MISCELLANEOUS) ×1
APL SKNCLS STERI-STRIP NONHPOA (GAUZE/BANDAGES/DRESSINGS) ×1
BENZOIN TINCTURE PRP APPL 2/3 (GAUZE/BANDAGES/DRESSINGS) ×2 IMPLANT
BLADE SURG 15 STRL LF DISP TIS (BLADE) ×1 IMPLANT
BLADE SURG 15 STRL SS (BLADE) ×2
CHLORAPREP W/TINT 26 (MISCELLANEOUS) ×2 IMPLANT
CLEANER CAUTERY TIP 5X5 PAD (MISCELLANEOUS) IMPLANT
COVER BACK TABLE 60X90IN (DRAPES) ×2 IMPLANT
COVER MAYO STAND STRL (DRAPES) ×2 IMPLANT
DRAPE LAPAROTOMY 100X72 PEDS (DRAPES) IMPLANT
DRAPE SHEET LG 3/4 BI-LAMINATE (DRAPES) IMPLANT
DRAPE UTILITY XL STRL (DRAPES) ×2 IMPLANT
DRSG TEGADERM 2-3/8X2-3/4 SM (GAUZE/BANDAGES/DRESSINGS) IMPLANT
DRSG TEGADERM 4X4.75 (GAUZE/BANDAGES/DRESSINGS) IMPLANT
ELECT REM PT RETURN 9FT ADLT (ELECTROSURGICAL) ×2
ELECTRODE REM PT RTRN 9FT ADLT (ELECTROSURGICAL) ×1 IMPLANT
GAUZE 4X4 16PLY ~~LOC~~+RFID DBL (SPONGE) ×2 IMPLANT
GAUZE SPONGE 4X4 12PLY STRL (GAUZE/BANDAGES/DRESSINGS) ×2 IMPLANT
GLOVE SURG ORTHO LTX SZ8 (GLOVE) ×2 IMPLANT
GOWN STRL REUS W/TWL LRG LVL3 (GOWN DISPOSABLE) ×2 IMPLANT
KIT TURNOVER CYSTO (KITS) ×2 IMPLANT
NDL HYPO 25X1 1.5 SAFETY (NEEDLE) ×1 IMPLANT
NEEDLE HYPO 25X1 1.5 SAFETY (NEEDLE) ×2 IMPLANT
NS IRRIG 500ML POUR BTL (IV SOLUTION) ×2 IMPLANT
PACK BASIN DAY SURGERY FS (CUSTOM PROCEDURE TRAY) ×2 IMPLANT
PAD CLEANER CAUTERY TIP 5X5 (MISCELLANEOUS)
PENCIL SMOKE EVACUATOR (MISCELLANEOUS) ×2 IMPLANT
STRIP CLOSURE SKIN 1/2X4 (GAUZE/BANDAGES/DRESSINGS) ×2 IMPLANT
SUT ETHILON 3 0 PS 1 (SUTURE) IMPLANT
SUT ETHILON 4 0 PS 2 18 (SUTURE) IMPLANT
SUT MNCRL AB 4-0 PS2 18 (SUTURE) ×2 IMPLANT
SUT VIC AB 3-0 SH 18 (SUTURE) IMPLANT
SUT VIC AB 4-0 PS2 18 (SUTURE) IMPLANT
SUT VICRYL 3-0 CR8 SH (SUTURE) ×2 IMPLANT
SYR CONTROL 10ML LL (SYRINGE) ×2 IMPLANT
TOWEL OR 17X26 10 PK STRL BLUE (TOWEL DISPOSABLE) ×2 IMPLANT
TUBE CONNECTING 12X1/4 (SUCTIONS) IMPLANT
WATER STERILE IRR 500ML POUR (IV SOLUTION) IMPLANT

## 2020-12-20 NOTE — Interval H&P Note (Signed)
History and Physical Interval Note:  12/20/2020 9:17 AM  Dean Pratt  has presented today for surgery, with the diagnosis of SOFT TISSUE MASS.  The various methods of treatment have been discussed with the patient and family. After consideration of risks, benefits and other options for treatment, the patient has consented to    Procedure(s): EXCISION  SOFT TISSUE MASS LEFT FLANK (Left) as a surgical intervention.    The patient's history has been reviewed, patient examined, no change in status, stable for surgery.  I have reviewed the patient's chart and labs.  Questions were answered to the patient's satisfaction.    Armandina Gemma, [facility] [place] practice Office: [place]

## 2020-12-20 NOTE — Discharge Instructions (Addendum)
°  [facility] -- DISCHARGE INSTRUCTIONS  REMINDER:   Carry a list of your medications and allergies with you at all times  Call your pharmacy at least 1 week in advance to refill prescriptions  Do not mix any prescribed pain medicine with alcohol  Do not drive any motor vehicles while taking pain medication  Take medications with food unless otherwise directed  Follow-up appointments (date to return to physician): Please call [phone] to confirm your follow up appointment with your surgeon.  Call your Surgeon if you have:  Temperature greater than 101.0  Persistent nausea and vomiting  Severe uncontrolled pain  Redness, tenderness, or signs of infection (pain, swelling, redness, odor or green/yellow discharge around the site)  Difficulty breathing, headache or visual disturbances  Hives  Persistent dizziness or light-headedness  Any other questions or concerns you may have after discharge  In an emergency, call 911 or go to an Emergency Department at a nearby hospital.  Diet: Begin with liquids, and if they are tolerated, resume your usual diet.  Avoid spicy, greasy or heavy foods.  If you have nausea or vomiting, go back to liquids.  If you cannot keep liquids down, call your doctor.  Avoid alcohol consumption while on prescription pain medications. Good nutrition promotes healing. Increase fiber and fluids.   ADDITIONAL INSTRUCTIONS:  Leave Dermabond in place for 7-10 day.  May shower.  Use ice pack for 48 hours and as needed.  Prescription for Tramadol for pain at pharmacy if needed.  [facility] Office: [place] Instructions  Activity: Get plenty of rest for the remainder of the day. A responsible individual must stay with you for 24 hours following the procedure.  For the next 24 hours, DO NOT: -Drive a car -Paediatric nurse -Drink alcoholic beverages -Take any medication unless instructed by your  physician -Make any legal decisions or sign important papers.  Meals: Start with liquid foods such as gelatin or soup. Progress to regular foods as tolerated. Avoid greasy, spicy, heavy foods. If nausea and/or vomiting occur, drink only clear liquids until the nausea and/or vomiting subsides. Call your physician if vomiting continues.  Special Instructions/Symptoms: Your throat may feel dry or sore from the anesthesia or the breathing tube placed in your throat during surgery. If this causes discomfort, gargle with warm salt water. The discomfort should disappear within 24 hours.

## 2020-12-20 NOTE — Transfer of Care (Signed)
Immediate Anesthesia Transfer of Care Note  Patient: Dean Pratt  Procedure(s) Performed: EXCISION  SOFT TISSUE MASS LEFT FLANK (Left: Flank)  Patient Location: PACU  Anesthesia Type:MAC  Level of Consciousness: awake, alert  and oriented  Airway & Oxygen Therapy: Patient Spontanous Breathing and Patient connected to face mask oxygen  Post-op Assessment: Report given to RN and Post -op Vital signs reviewed and stable  Post vital signs: Reviewed and stable  Last Vitals:  Vitals Value Taken Time  BP 113/66   Temp    Pulse 76 12/20/20 1031  Resp 28 12/20/20 1031  SpO2 98 % 12/20/20 1031  Vitals shown include unvalidated device data.  Last Pain:  Vitals:   12/20/20 0902  TempSrc: Oral  PainSc: 0-No pain      Patients Stated Pain Goal: 4 (11/73/56 7014)  Complications: No notable events documented.

## 2020-12-20 NOTE — Anesthesia Postprocedure Evaluation (Signed)
Anesthesia Post Note  Patient: Dean Pratt  Procedure(s) Performed: EXCISION  SOFT TISSUE MASS LEFT FLANK (Left: Flank)     Patient location during evaluation: PACU Anesthesia Type: MAC Level of consciousness: awake and alert Pain management: pain level controlled Vital Signs Assessment: post-procedure vital signs reviewed and stable Respiratory status: spontaneous breathing, nonlabored ventilation and respiratory function stable Cardiovascular status: stable and blood pressure returned to baseline Postop Assessment: no apparent nausea or vomiting Anesthetic complications: no   No notable events documented.  Last Vitals:  Vitals:   12/20/20 1100 12/20/20 1134  BP: 115/66 129/67  Pulse: 63 66  Resp: (!) 21 17  Temp:  (!) 36.4 C  SpO2: 96% 99%    Last Pain:  Vitals:   12/20/20 1134  TempSrc:   PainSc: 0-No pain                 Dwyane Dupree,W. EDMOND

## 2020-12-20 NOTE — Op Note (Signed)
Operative Note  Pre-operative Diagnosis:  soft tissue mass left flank   Post-operative Diagnosis:  same  Surgeon:  Armandina Gemma, MD  Assistant:  none   Procedure:  excision soft tissue mass left flank, 8 x 5 x 3 cm, subcutaneous  Anesthesia:  local with IV sedation (monitored sedation)  Estimated Blood Loss:  minimal  Drains: none         Specimen: to pathology  Indications:  Patient is referred by Dr. Allyn Kenner, Brooke Bonito. for surgical evaluation of soft tissue mass of the back. Patient states that he has had masses on the back present for a number of years. They have gradually become larger. He has now developed pain in the lower back and radiating into the right posterior thigh. He presents today for surgical excision under sedation.  Procedure:  The patient was seen in the pre-op holding area. The risks, benefits, complications, treatment options, and expected outcomes were previously discussed with the patient. The patient agreed with the proposed plan and has signed the informed consent form.  The patient was brought to the operating room by the surgical team, identified as Dean Pratt and the procedure verified. A "time out" was completed and the above information confirmed.  Patient was placed on the operating room table and positioned by the staff.  After administration of intravenous sedation, the patient was prepped and draped in usual aseptic fashion.  After ascertaining that an adequate level sedation had been achieved, the skin was anesthetized with local anesthetic.  A 5 cm incision is made over the palpable mass.  Dissection was carried through the subcutaneous tissues using the electrocautery for hemostasis.  Mass is localized and then mobilized using the electrocautery.  The mass is excised down to the underlying fascia.  Vascular structures are divided with the electrocautery.  The entire mass is excised.  It measures 8 x 5 x 3 cm.  It is submitted in its entirety to pathology  for review.  Local anesthetic is infiltrated through the base of the surgical wound.  Good hemostasis is noted.  Subcutaneous tissues are reapproximated with interrupted 3-0 Vicryl sutures.  Skin edges are reapproximated with a running 4-0 Monocryl subcuticular suture.  Wound is washed and dried and Dermabond is applied as dressing.  Patient is awakened from anesthesia and transported to the recovery room in stable condition.  The patient tolerated the procedure well.   Armandina Gemma, [facility] Office: [phone]

## 2020-12-20 NOTE — Anesthesia Preprocedure Evaluation (Addendum)
Anesthesia Evaluation  Patient identified by MRN, date of birth, ID band Patient awake    Reviewed: Allergy & Precautions, H&P , NPO status , Patient's Chart, lab work & pertinent test results  Airway Mallampati: II  TM Distance: >3 FB Neck ROM: Full    Dental no notable dental hx. (+) Edentulous Upper, Edentulous Lower, Dental Advisory Given   Pulmonary neg pulmonary ROS, former smoker,    Pulmonary exam normal breath sounds clear to auscultation       Cardiovascular hypertension, Pt. on medications +CHF  + dysrhythmias Atrial Fibrillation  Rhythm:Regular Rate:Normal     Neuro/Psych  Headaches, Anxiety CVA, No Residual Symptoms    GI/Hepatic Neg liver ROS, GERD  Medicated,  Endo/Other  negative endocrine ROS  Renal/GU negative Renal ROS  negative genitourinary   Musculoskeletal  (+) Arthritis , Osteoarthritis,    Abdominal   Peds  Hematology negative hematology ROS (+)   Anesthesia Other Findings   Reproductive/Obstetrics negative OB ROS                            Anesthesia Physical Anesthesia Plan  ASA: 3  Anesthesia Plan: MAC   Post-op Pain Management: Tylenol PO (pre-op)   Induction: Intravenous  PONV Risk Score and Plan: 2 and Propofol infusion and Ondansetron  Airway Management Planned: Natural Airway and Simple Face Mask  Additional Equipment:   Intra-op Plan:   Post-operative Plan:   Informed Consent: I have reviewed the patients History and Physical, chart, labs and discussed the procedure including the risks, benefits and alternatives for the proposed anesthesia with the patient or authorized representative who has indicated his/her understanding and acceptance.     Dental advisory given  Plan Discussed with: CRNA  Anesthesia Plan Comments:         Anesthesia Quick Evaluation

## 2020-12-23 ENCOUNTER — Encounter (HOSPITAL_BASED_OUTPATIENT_CLINIC_OR_DEPARTMENT_OTHER): Payer: Self-pay | Admitting: Surgery

## 2020-12-23 LAB — SURGICAL PATHOLOGY

## 2020-12-31 DIAGNOSIS — Z85828 Personal history of other malignant neoplasm of skin: Secondary | ICD-10-CM | POA: Diagnosis not present

## 2020-12-31 DIAGNOSIS — X32XXXD Exposure to sunlight, subsequent encounter: Secondary | ICD-10-CM | POA: Diagnosis not present

## 2020-12-31 DIAGNOSIS — L570 Actinic keratosis: Secondary | ICD-10-CM | POA: Diagnosis not present

## 2020-12-31 DIAGNOSIS — Z08 Encounter for follow-up examination after completed treatment for malignant neoplasm: Secondary | ICD-10-CM | POA: Diagnosis not present

## 2021-01-01 NOTE — Progress Notes (Signed)
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  [place], MD [facility] [place] practice Office: [phone]

## 2021-01-09 ENCOUNTER — Emergency Department (HOSPITAL_COMMUNITY): Payer: Medicare HMO

## 2021-01-09 ENCOUNTER — Other Ambulatory Visit: Payer: Self-pay

## 2021-01-09 ENCOUNTER — Observation Stay (HOSPITAL_BASED_OUTPATIENT_CLINIC_OR_DEPARTMENT_OTHER): Payer: Medicare HMO

## 2021-01-09 ENCOUNTER — Inpatient Hospital Stay (HOSPITAL_COMMUNITY)
Admission: EM | Admit: 2021-01-09 | Discharge: 2021-01-14 | DRG: 064 | Disposition: A | Discharge status: Skilled Nursing Facility | Payer: Medicare HMO | Attending: Internal Medicine | Admitting: Internal Medicine

## 2021-01-09 ENCOUNTER — Observation Stay (HOSPITAL_COMMUNITY): Payer: Medicare HMO

## 2021-01-09 ENCOUNTER — Encounter (HOSPITAL_COMMUNITY): Payer: Self-pay | Admitting: Emergency Medicine

## 2021-01-09 DIAGNOSIS — S0003XA Contusion of scalp, initial encounter: Secondary | ICD-10-CM | POA: Diagnosis not present

## 2021-01-09 DIAGNOSIS — Z7982 Long term (current) use of aspirin: Secondary | ICD-10-CM

## 2021-01-09 DIAGNOSIS — W19XXXA Unspecified fall, initial encounter: Secondary | ICD-10-CM | POA: Diagnosis not present

## 2021-01-09 DIAGNOSIS — M109 Gout, unspecified: Secondary | ICD-10-CM | POA: Diagnosis present

## 2021-01-09 DIAGNOSIS — N1831 Chronic kidney disease, stage 3a: Secondary | ICD-10-CM | POA: Diagnosis present

## 2021-01-09 DIAGNOSIS — N400 Enlarged prostate without lower urinary tract symptoms: Secondary | ICD-10-CM | POA: Diagnosis present

## 2021-01-09 DIAGNOSIS — Y92039 Unspecified place in apartment as the place of occurrence of the external cause: Secondary | ICD-10-CM

## 2021-01-09 DIAGNOSIS — W1839XA Other fall on same level, initial encounter: Secondary | ICD-10-CM | POA: Diagnosis present

## 2021-01-09 DIAGNOSIS — Z20822 Contact with and (suspected) exposure to covid-19: Secondary | ICD-10-CM | POA: Diagnosis present

## 2021-01-09 DIAGNOSIS — E041 Nontoxic single thyroid nodule: Secondary | ICD-10-CM | POA: Diagnosis not present

## 2021-01-09 DIAGNOSIS — I517 Cardiomegaly: Secondary | ICD-10-CM | POA: Diagnosis not present

## 2021-01-09 DIAGNOSIS — I4891 Unspecified atrial fibrillation: Secondary | ICD-10-CM

## 2021-01-09 DIAGNOSIS — Z8042 Family history of malignant neoplasm of prostate: Secondary | ICD-10-CM

## 2021-01-09 DIAGNOSIS — Z8249 Family history of ischemic heart disease and other diseases of the circulatory system: Secondary | ICD-10-CM

## 2021-01-09 DIAGNOSIS — I63421 Cerebral infarction due to embolism of right anterior cerebral artery: Secondary | ICD-10-CM | POA: Diagnosis not present

## 2021-01-09 DIAGNOSIS — I639 Cerebral infarction, unspecified: Secondary | ICD-10-CM

## 2021-01-09 DIAGNOSIS — I6340 Cerebral infarction due to embolism of unspecified cerebral artery: Secondary | ICD-10-CM | POA: Insufficient documentation

## 2021-01-09 DIAGNOSIS — L719 Rosacea, unspecified: Secondary | ICD-10-CM | POA: Diagnosis present

## 2021-01-09 DIAGNOSIS — Z66 Do not resuscitate: Secondary | ICD-10-CM | POA: Diagnosis present

## 2021-01-09 DIAGNOSIS — Z043 Encounter for examination and observation following other accident: Secondary | ICD-10-CM | POA: Diagnosis not present

## 2021-01-09 DIAGNOSIS — I10 Essential (primary) hypertension: Secondary | ICD-10-CM | POA: Diagnosis present

## 2021-01-09 DIAGNOSIS — Z8673 Personal history of transient ischemic attack (TIA), and cerebral infarction without residual deficits: Secondary | ICD-10-CM

## 2021-01-09 DIAGNOSIS — M4312 Spondylolisthesis, cervical region: Secondary | ICD-10-CM | POA: Diagnosis not present

## 2021-01-09 DIAGNOSIS — S0181XA Laceration without foreign body of other part of head, initial encounter: Secondary | ICD-10-CM

## 2021-01-09 DIAGNOSIS — D45 Polycythemia vera: Secondary | ICD-10-CM | POA: Diagnosis present

## 2021-01-09 DIAGNOSIS — R911 Solitary pulmonary nodule: Secondary | ICD-10-CM | POA: Diagnosis not present

## 2021-01-09 DIAGNOSIS — R55 Syncope and collapse: Secondary | ICD-10-CM | POA: Diagnosis present

## 2021-01-09 DIAGNOSIS — Z79899 Other long term (current) drug therapy: Secondary | ICD-10-CM

## 2021-01-09 DIAGNOSIS — Z800 Family history of malignant neoplasm of digestive organs: Secondary | ICD-10-CM

## 2021-01-09 DIAGNOSIS — S0990XA Unspecified injury of head, initial encounter: Secondary | ICD-10-CM | POA: Diagnosis not present

## 2021-01-09 DIAGNOSIS — Z9181 History of falling: Secondary | ICD-10-CM

## 2021-01-09 DIAGNOSIS — K219 Gastro-esophageal reflux disease without esophagitis: Secondary | ICD-10-CM | POA: Diagnosis present

## 2021-01-09 DIAGNOSIS — I7121 Aneurysm of the ascending aorta, without rupture: Secondary | ICD-10-CM | POA: Diagnosis present

## 2021-01-09 DIAGNOSIS — S0240CA Maxillary fracture, right side, initial encounter for closed fracture: Secondary | ICD-10-CM | POA: Diagnosis present

## 2021-01-09 DIAGNOSIS — R58 Hemorrhage, not elsewhere classified: Secondary | ICD-10-CM | POA: Diagnosis not present

## 2021-01-09 DIAGNOSIS — R29700 NIHSS score 0: Secondary | ICD-10-CM | POA: Diagnosis present

## 2021-01-09 DIAGNOSIS — I480 Paroxysmal atrial fibrillation: Secondary | ICD-10-CM | POA: Diagnosis present

## 2021-01-09 DIAGNOSIS — S02401A Maxillary fracture, unspecified, initial encounter for closed fracture: Secondary | ICD-10-CM | POA: Diagnosis not present

## 2021-01-09 DIAGNOSIS — J9811 Atelectasis: Secondary | ICD-10-CM | POA: Diagnosis not present

## 2021-01-09 DIAGNOSIS — Z23 Encounter for immunization: Secondary | ICD-10-CM

## 2021-01-09 DIAGNOSIS — Z743 Need for continuous supervision: Secondary | ICD-10-CM | POA: Diagnosis not present

## 2021-01-09 DIAGNOSIS — S066X0A Traumatic subarachnoid hemorrhage without loss of consciousness, initial encounter: Secondary | ICD-10-CM | POA: Diagnosis not present

## 2021-01-09 DIAGNOSIS — S0993XA Unspecified injury of face, initial encounter: Secondary | ICD-10-CM | POA: Diagnosis not present

## 2021-01-09 DIAGNOSIS — R0781 Pleurodynia: Secondary | ICD-10-CM | POA: Diagnosis not present

## 2021-01-09 DIAGNOSIS — Z888 Allergy status to other drugs, medicaments and biological substances status: Secondary | ICD-10-CM

## 2021-01-09 DIAGNOSIS — I5032 Chronic diastolic (congestive) heart failure: Secondary | ICD-10-CM | POA: Diagnosis present

## 2021-01-09 DIAGNOSIS — I130 Hypertensive heart and chronic kidney disease with heart failure and stage 1 through stage 4 chronic kidney disease, or unspecified chronic kidney disease: Secondary | ICD-10-CM | POA: Diagnosis present

## 2021-01-09 DIAGNOSIS — R5381 Other malaise: Secondary | ICD-10-CM

## 2021-01-09 DIAGNOSIS — Z825 Family history of asthma and other chronic lower respiratory diseases: Secondary | ICD-10-CM

## 2021-01-09 DIAGNOSIS — I482 Chronic atrial fibrillation, unspecified: Secondary | ICD-10-CM | POA: Diagnosis present

## 2021-01-09 DIAGNOSIS — Z85828 Personal history of other malignant neoplasm of skin: Secondary | ICD-10-CM

## 2021-01-09 DIAGNOSIS — E785 Hyperlipidemia, unspecified: Secondary | ICD-10-CM | POA: Diagnosis present

## 2021-01-09 DIAGNOSIS — F0390 Unspecified dementia without behavioral disturbance: Secondary | ICD-10-CM | POA: Diagnosis present

## 2021-01-09 DIAGNOSIS — J439 Emphysema, unspecified: Secondary | ICD-10-CM | POA: Diagnosis present

## 2021-01-09 DIAGNOSIS — I680 Cerebral amyloid angiopathy: Secondary | ICD-10-CM | POA: Diagnosis present

## 2021-01-09 DIAGNOSIS — R296 Repeated falls: Secondary | ICD-10-CM | POA: Diagnosis present

## 2021-01-09 DIAGNOSIS — J449 Chronic obstructive pulmonary disease, unspecified: Secondary | ICD-10-CM | POA: Diagnosis present

## 2021-01-09 DIAGNOSIS — E854 Organ-limited amyloidosis: Secondary | ICD-10-CM | POA: Diagnosis present

## 2021-01-09 DIAGNOSIS — S066X1A Traumatic subarachnoid hemorrhage with loss of consciousness of 30 minutes or less, initial encounter: Secondary | ICD-10-CM | POA: Diagnosis present

## 2021-01-09 DIAGNOSIS — Z803 Family history of malignant neoplasm of breast: Secondary | ICD-10-CM

## 2021-01-09 DIAGNOSIS — M47812 Spondylosis without myelopathy or radiculopathy, cervical region: Secondary | ICD-10-CM | POA: Diagnosis not present

## 2021-01-09 DIAGNOSIS — R918 Other nonspecific abnormal finding of lung field: Secondary | ICD-10-CM | POA: Diagnosis present

## 2021-01-09 LAB — ECHOCARDIOGRAM COMPLETE
AR max vel: 2.01 cm2
AV Area VTI: 2 cm2
AV Area mean vel: 1.89 cm2
AV Mean grad: 4 mmHg
AV Peak grad: 7.3 mmHg
Ao pk vel: 1.35 m/s
Height: 70 in
S' Lateral: 3.9 cm
Weight: 2640 oz

## 2021-01-09 LAB — CBC WITH DIFFERENTIAL/PLATELET
Abs Immature Granulocytes: 0.04 10*3/uL (ref 0.00–0.07)
Basophils Absolute: 0.1 10*3/uL (ref 0.0–0.1)
Basophils Relative: 1 %
Eosinophils Absolute: 0.1 10*3/uL (ref 0.0–0.5)
Eosinophils Relative: 1 %
HCT: 50.8 % (ref 39.0–52.0)
Hemoglobin: 16.1 g/dL (ref 13.0–17.0)
Immature Granulocytes: 0 %
Lymphocytes Relative: 8 %
Lymphs Abs: 0.8 10*3/uL (ref 0.7–4.0)
MCH: 28 pg (ref 26.0–34.0)
MCHC: 31.7 g/dL (ref 30.0–36.0)
MCV: 88.3 fL (ref 80.0–100.0)
Monocytes Absolute: 0.7 10*3/uL (ref 0.1–1.0)
Monocytes Relative: 7 %
Neutro Abs: 8.8 10*3/uL — ABNORMAL HIGH (ref 1.7–7.7)
Neutrophils Relative %: 83 %
Platelets: 337 10*3/uL (ref 150–400)
RBC: 5.75 MIL/uL (ref 4.22–5.81)
RDW: 15.4 % (ref 11.5–15.5)
WBC: 10.5 10*3/uL (ref 4.0–10.5)
nRBC: 0 % (ref 0.0–0.2)

## 2021-01-09 LAB — BASIC METABOLIC PANEL
Anion gap: 12 (ref 5–15)
BUN: 12 mg/dL (ref 8–23)
CO2: 19 mmol/L — ABNORMAL LOW (ref 22–32)
Calcium: 9.6 mg/dL (ref 8.9–10.3)
Chloride: 107 mmol/L (ref 98–111)
Creatinine, Ser: 1.31 mg/dL — ABNORMAL HIGH (ref 0.61–1.24)
GFR, Estimated: 54 mL/min — ABNORMAL LOW (ref >60)
Glucose, Bld: 140 mg/dL — ABNORMAL HIGH (ref 70–99)
Potassium: 3.6 mmol/L (ref 3.5–5.1)
Sodium: 138 mmol/L (ref 135–145)

## 2021-01-09 LAB — D-DIMER, QUANTITATIVE: D-Dimer, Quant: 2.83 ug/mL-FEU — ABNORMAL HIGH (ref 0.00–0.50)

## 2021-01-09 LAB — TROPONIN I (HIGH SENSITIVITY)
Troponin I (High Sensitivity): 7 ng/L (ref <18)
Troponin I (High Sensitivity): 9 ng/L (ref <18)

## 2021-01-09 LAB — PROTIME-INR
INR: 1.2 (ref 0.8–1.2)
Prothrombin Time: 15.2 seconds (ref 11.4–15.2)

## 2021-01-09 LAB — MAGNESIUM: Magnesium: 1.7 mg/dL (ref 1.7–2.4)

## 2021-01-09 MED ORDER — OLOPATADINE HCL 0.1 % OP SOLN
1.0000 [drp] | Freq: Two times a day (BID) | OPHTHALMIC | Status: DC
  Administered 2021-01-09 – 2021-01-14 (×10): 1 [drp] via OPHTHALMIC
  Filled 2021-01-09 (×3): qty 5

## 2021-01-09 MED ORDER — ALLOPURINOL 100 MG PO TABS
100.0000 mg | ORAL_TABLET | Freq: Every day | ORAL | Status: DC
  Administered 2021-01-09 – 2021-01-13 (×5): 100 mg via ORAL
  Filled 2021-01-09 (×6): qty 1

## 2021-01-09 MED ORDER — ROSUVASTATIN CALCIUM 5 MG PO TABS
10.0000 mg | ORAL_TABLET | Freq: Every day | ORAL | Status: DC
Start: 2021-01-09 — End: 2021-01-15
  Administered 2021-01-09 – 2021-01-14 (×6): 10 mg via ORAL
  Filled 2021-01-09 (×6): qty 2

## 2021-01-09 MED ORDER — DILTIAZEM HCL ER COATED BEADS 240 MG PO CP24
240.0000 mg | ORAL_CAPSULE | Freq: Every day | ORAL | Status: DC
  Administered 2021-01-10 – 2021-01-14 (×5): 240 mg via ORAL
  Filled 2021-01-09 (×5): qty 1

## 2021-01-09 MED ORDER — IOHEXOL 350 MG/ML SOLN
100.0000 mL | Freq: Once | INTRAVENOUS | Status: AC | PRN
  Administered 2021-01-09: 100 mL via INTRAVENOUS

## 2021-01-09 MED ORDER — MORPHINE SULFATE (PF) 2 MG/ML IV SOLN
2.0000 mg | INTRAVENOUS | Status: DC | PRN
  Administered 2021-01-10 – 2021-01-13 (×2): 2 mg via INTRAVENOUS
  Filled 2021-01-09 (×2): qty 1

## 2021-01-09 MED ORDER — ACETAMINOPHEN 650 MG RE SUPP: 650.0000 mg | Freq: Four times a day (QID) | RECTAL | Status: DC | PRN

## 2021-01-09 MED ORDER — ONDANSETRON HCL 4 MG PO TABS: 4.0000 mg | ORAL_TABLET | Freq: Four times a day (QID) | ORAL | Status: DC | PRN

## 2021-01-09 MED ORDER — LACTATED RINGERS IV SOLN: INTRAVENOUS | Status: DC

## 2021-01-09 MED ORDER — SODIUM SULFATE-MAG SULFATE-KCL 1479-225-188 MG PO TABS: 1.0000 | ORAL_TABLET | Freq: Every day | ORAL | Status: DC

## 2021-01-09 MED ORDER — HYDROXYUREA 500 MG PO CAPS
500.0000 mg | ORAL_CAPSULE | Freq: Every day | ORAL | Status: DC
  Administered 2021-01-09 – 2021-01-14 (×6): 500 mg via ORAL
  Filled 2021-01-09 (×6): qty 1

## 2021-01-09 MED ORDER — ONDANSETRON HCL 4 MG/2ML IJ SOLN
4.0000 mg | Freq: Four times a day (QID) | INTRAMUSCULAR | Status: DC | PRN
  Administered 2021-01-10: 4 mg via INTRAVENOUS
  Filled 2021-01-09: qty 2

## 2021-01-09 MED ORDER — ACETAMINOPHEN 325 MG PO TABS
650.0000 mg | ORAL_TABLET | Freq: Four times a day (QID) | ORAL | Status: DC | PRN
  Administered 2021-01-11 – 2021-01-12 (×2): 650 mg via ORAL
  Filled 2021-01-09 (×2): qty 2

## 2021-01-09 MED ORDER — DONEPEZIL HCL 10 MG PO TABS
5.0000 mg | ORAL_TABLET | Freq: Every day | ORAL | Status: DC
  Administered 2021-01-09 – 2021-01-13 (×5): 5 mg via ORAL
  Filled 2021-01-09 (×6): qty 1

## 2021-01-09 MED ORDER — METHOCARBAMOL 500 MG PO TABS
500.0000 mg | ORAL_TABLET | Freq: Three times a day (TID) | ORAL | Status: DC | PRN
  Administered 2021-01-09 – 2021-01-11 (×2): 500 mg via ORAL
  Filled 2021-01-09 (×2): qty 1

## 2021-01-09 MED ORDER — OXYCODONE HCL 5 MG PO TABS
5.0000 mg | ORAL_TABLET | ORAL | Status: DC | PRN
  Administered 2021-01-09 – 2021-01-12 (×4): 5 mg via ORAL
  Filled 2021-01-09 (×4): qty 1

## 2021-01-09 MED ORDER — LORAZEPAM 1 MG PO TABS: 0.5000 mg | ORAL_TABLET | Freq: Two times a day (BID) | ORAL | Status: DC | PRN

## 2021-01-09 MED ORDER — CYCLOSPORINE 0.05 % OP EMUL
1.0000 [drp] | Freq: Two times a day (BID) | OPHTHALMIC | Status: DC | PRN
  Filled 2021-01-09: qty 30

## 2021-01-09 MED ORDER — LIDOCAINE-EPINEPHRINE (PF) 2 %-1:200000 IJ SOLN
20.0000 mL | Freq: Once | INTRAMUSCULAR | Status: AC
  Administered 2021-01-09: 20 mL
  Filled 2021-01-09: qty 20

## 2021-01-09 MED ORDER — SODIUM CHLORIDE 0.9% FLUSH
3.0000 mL | Freq: Two times a day (BID) | INTRAVENOUS | Status: DC
  Administered 2021-01-12 – 2021-01-14 (×5): 3 mL via INTRAVENOUS

## 2021-01-09 MED ORDER — PANTOPRAZOLE SODIUM 40 MG PO TBEC
40.0000 mg | DELAYED_RELEASE_TABLET | Freq: Every day | ORAL | Status: DC
Start: 2021-01-09 — End: 2021-01-15
  Administered 2021-01-09 – 2021-01-14 (×6): 40 mg via ORAL
  Filled 2021-01-09 (×6): qty 1

## 2021-01-09 NOTE — Assessment & Plan Note (Signed)
-  Rate controlled with Cardizem -He has been on 81 mg ASA since his prior hemorrhagic stroke in 2012 -Hold ASA and AC for now but could resume soon according to neurosurgery

## 2021-01-09 NOTE — ED Provider Triage Note (Signed)
Emergency Medicine Provider Triage Evaluation Note  WILLLIAM Pratt , a 86 y.o. male  was evaluated in triage.  Pt complains of fall vs syncope. Got up to go to the RR 3 hours PTA and fell.  He thinks he was dizzy and possibly passed out at that time.  He hit his face her does not know when he hit it on.  He does have history of atrial fibrillation however states he is not on any blood thinners.  He has large, deep laceration to left periorbital area.  Denies any vision changes.  Has some neck pain, right posterior rib pain.  Ambulatory since. No unilateral weakness, slurred speech  Review of Systems  Positive: Fall versus syncope, laceration Negative:   Physical Exam  BP (!) 176/76 (BP Location: Right Arm)    Pulse 65    Temp (!) 97.5 F (36.4 C) (Oral)    Resp 16    SpO2 100%  Gen:   Awake, no distress Head:  Large, deep laceration to the left periorbital area, active bleeding. Resp:  Normal effort  MSK:   Moves extremities without difficulty  Other:    Medical Decision Making  Medically screening exam initiated at 10:34 AM.  Appropriate orders placed.  JOVANY DISANO was informed that the remainder of the evaluation will be completed by another provider, this initial triage assessment does not replace that evaluation, and the importance of remaining in the ED until their evaluation is complete.  Fall versus syncope, head laceration.  Attempt to place figure-of-eight pattern however unable to visualize or active bleeding site, gauze placed with pressure wrap  Nursing discussed with charge patient needs room in back   Demita Tobia A, PA-C 01/09/21 1037

## 2021-01-09 NOTE — Assessment & Plan Note (Signed)
-  Emphysema noted on CTA -He is not taking medications for this issue currently

## 2021-01-09 NOTE — Assessment & Plan Note (Signed)
-  Etiology is not clear. The differential diagnosis is broad, including vasovagal syncope, seizure, TIA/stroke, arrhythmia (has known afib), ACS (no CP, negative troponin x 2), hypoglycemia, orthostatic status, carotid artery stenosis -Severity of the injury sustained during syncope does not correlate with the etiology of syncope, but rather is a manifestation of activity around the time of syncope. -Patients at highest risk from syncope include those with serious comorbidities; age >93; exertional > supine syncope -This patient is at moderate/high risk for serious outcome and thus should be observed overnight on telemetry in the hospital. -Cardiac syncope is more likely when associated with syncope during exertion -Orthostatic vital signs now and in AM -Given significant head trauma and dizziness that may have preceded event, will order MRI. -2d echo is part of the extended work-up when cardiac etiology is suspected; given his exertional syncope, will order -Neuro checks q4h -check TSH -PT/OT eval and treat -Consider loop recorder if syncope recurs

## 2021-01-09 NOTE — Assessment & Plan Note (Signed)
-  Patient d/w Dr. Ellene Route by EDP -This is not an unexpected finding given his head trauma -No further evaluation is needed -Could start [facility] if needed, per ER PA -Will hold [facility] for now -MRI ordered, as noted above

## 2021-01-09 NOTE — Assessment & Plan Note (Signed)
-  Recommended for non-emergent outpatient [place] -Daughter is aware and will f/u with PCP to request

## 2021-01-09 NOTE — ED Notes (Signed)
Left forehead wound bleeding through bandage.  Provider to room to assess at this time.

## 2021-01-09 NOTE — Assessment & Plan Note (Signed)
-  appreciated on CTA -Needs non-contrast CT repeated in 12 months

## 2021-01-09 NOTE — Assessment & Plan Note (Signed)
-  He was oriented, conversant, and very pleasant and appropriate at the time of my evaluation -Continue Aricept, Ativan

## 2021-01-09 NOTE — Assessment & Plan Note (Signed)
-  4 cm -Needs annual imaging by CTA or MRA

## 2021-01-09 NOTE — ED Provider Notes (Signed)
[facility] EMERGENCY DEPARTMENT Provider Note   CSN: [id number] Arrival date & time: 01/09/21  0947     History  Chief Complaint  Patient presents with   Lytle Michaels    Dean Pratt is a 86 y.o. male.  86 year old male with past medical history of polycythemia vera, HFpEF, paroxysmal A. fib, CVA presents today for evaluation of syncopal episode that occurred earlier today.  Patient recalls ambulating to the restroom when he felt dizzy/lightheaded prior to passing out.  He is unsure of how long he was passed out for.  Believes he hit his head on the hardwood floors.  Denies chest pain, palpitations, dyspnea prior to the episode or since.   The history is provided by the patient. No language interpreter was used.  Fall Pertinent negatives include no chest pain, no abdominal pain and no shortness of breath.      Home Medications Prior to Admission medications   Medication Sig Start Date End Date Taking? Authorizing Provider  acetaminophen (TYLENOL) 325 MG tablet Take 650 mg by mouth every 6 (six) hours as needed.      [provider]  allopurinol (ZYLOPRIM) 100 MG tablet Take 100 mg by mouth at bedtime.      [provider]  Ascorbic Acid (VITAMIN C) 1000 MG tablet Take 1,000 mg by mouth daily.     [provider]  aspirin 81 MG tablet Take 81 mg by mouth daily.      [provider]  cycloSPORINE (RESTASIS) 0.05 % ophthalmic emulsion Place 1 drop into both eyes as needed. 03/15/18   [provider]  diclofenac Sodium (VOLTAREN) 1 % GEL Apply 2-4 g topically 4 (four) times daily. 09/15/19   Mcarthur Rossetti, MD  diltiazem (CARDIZEM CD) 240 MG 24 hr capsule TAKE 1 CAPSULE DAILY. PLEASE CALL OFFICE TO SCHEDULE APPOINTMENT FOR FURTHER REFILLS 05/25/19   Park Liter, MD  docusate sodium (COLACE) 100 MG capsule Take 1 capsule by mouth as needed. 01/20/11   [provider]  donepezil (ARICEPT) 5 MG tablet Take 5  mg by mouth at bedtime. 06/12/19   [provider]  famotidine (PEPCID) 20 MG tablet Take 20 mg by mouth at bedtime as needed. 05/16/20   [provider]  fluticasone (CUTIVATE) 0.05 % cream Apply 1 application topically daily. 11/09/18   [provider]  hydroxyurea (HYDREA) 500 MG capsule TAKE 1 CAPSULE (500 MG TOTAL) BY MOUTH DAILY. MAY TAKE WITH FOOD TO MINIMIZE GI SIDE EFFECTS. 08/30/20   Curt Bears, MD  ibuprofen (ADVIL,MOTRIN) 200 MG tablet Take 200 mg by mouth every 6 (six) hours as needed.    [provider]  LORazepam (ATIVAN) 1 MG tablet TAKE 1/2-1 TABLET TWICE DAILY AS NEEDED 03/18/17   [provider]  losartan (COZAAR) 25 MG tablet TAKE 1 TABLET DAILY. PLEASE MAKE YEARLY APPT WITH DR. ALLRED FOR OCTOBER 2022 FOR FUTURE REFILLS 12/19/20   Allred, Jeneen Rinks, MD  methocarbamol (ROBAXIN) 500 MG tablet Take 1 tablet (500 mg total) by mouth every 8 (eight) hours as needed for muscle spasms. 05/30/19   Magnus Sinning, MD  NON FORMULARY Stool softener 100 mg... Take 1 capsule by mouth once daily.     [provider]  Olopatadine HCl 0.2 % SOLN INSTILL 1 DROP INTO BOTH EYES TWICE A DAY 07/10/18   [provider]  pantoprazole (PROTONIX) 40 MG tablet Take 1 tablet (40 mg total) by mouth daily. 09/02/20   Scarlette Shorts  N, MD  rosuvastatin (CRESTOR) 10 MG tablet Take 10 mg by mouth daily. 09/06/17   [provider]  traMADol (ULTRAM) 50 MG tablet Take 1-2 tablets (50-100 mg total) by mouth every 6 (six) hours as needed for moderate pain. 12/20/20 12/20/21  Armandina Gemma, MD      Allergies    Ace inhibitors and Warfarin and related    Review of Systems   Review of Systems  Constitutional:  Negative for chills and fever.  Eyes:  Negative for visual disturbance.  Respiratory:  Negative for shortness of breath.   Cardiovascular:  Negative for chest pain, palpitations and leg swelling.  Gastrointestinal:  Negative for abdominal pain,  nausea and vomiting.  Neurological:  Positive for light-headedness. Negative for weakness.  All other systems reviewed and are negative.  Physical Exam Updated Vital Signs BP 128/80    Pulse 78    Temp (!) 97.5 F (36.4 C) (Oral)    Resp 18    Ht 5\' 10"  (1.778 m)    Wt 74.8 kg    SpO2 100%    BMI 23.68 kg/m  Physical Exam Vitals and nursing note reviewed.  Constitutional:      General: He is not in acute distress.    Appearance: Normal appearance. He is not ill-appearing.  HENT:     Head: Normocephalic and atraumatic.     Nose: Nose normal.  Eyes:     General: No scleral icterus.    Extraocular Movements: Extraocular movements intact.     Conjunctiva/sclera: Conjunctivae normal.  Cardiovascular:     Rate and Rhythm: Normal rate. Rhythm irregular.     Pulses: Normal pulses.     Heart sounds: Normal heart sounds.  Pulmonary:     Effort: Pulmonary effort is normal. No respiratory distress.     Breath sounds: Normal breath sounds. No wheezing or rales.  Abdominal:     General: There is no distension.     Palpations: Abdomen is soft.     Tenderness: There is no abdominal tenderness. There is no guarding.  Musculoskeletal:        General: Normal range of motion.     Cervical back: Normal range of motion.     Right lower leg: No edema.     Left lower leg: No edema.  Skin:    General: Skin is warm and dry.  Neurological:     General: No focal deficit present.     Mental Status: He is alert. Mental status is at baseline.    ED Results / Procedures / Treatments   Labs (all labs ordered are listed, but only abnormal results are displayed) Labs Reviewed  CBC WITH DIFFERENTIAL/PLATELET - Abnormal; Notable for the following components:      Result Value   Neutro Abs 8.8 (*)    All other components within normal limits  BASIC METABOLIC PANEL - Abnormal; Notable for the following components:   CO2 19 (*)    Glucose, Bld 140 (*)    Creatinine, Ser 1.31 (*)    GFR, Estimated 54  (*)    All other components within normal limits  PROTIME-INR  MAGNESIUM  D-DIMER, QUANTITATIVE  TROPONIN I (HIGH SENSITIVITY)  TROPONIN I (HIGH SENSITIVITY)    EKG EKG Interpretation  Date/Time:  Thursday January 09 2021 10:06:40 EST Ventricular Rate:  76 PR Interval:    QRS Duration: 96 QT Interval:  402 QTC Calculation: 452 R Axis:   64 Text Interpretation: Atrial fibrillation with premature ventricular  or aberrantly conducted complexes Septal infarct , age undetermined Abnormal ECG When compared with ECG of 16-Oct-2009 08:13, PREVIOUS ECG IS PRESENT Confirmed by Lavenia Atlas [phone]) on 01/09/2021 12:42:32 PM  Radiology DG Ribs Unilateral W/Chest Right  Result Date: 01/09/2021 CLINICAL DATA:  Right rib pain after fall. EXAM: RIGHT RIBS AND CHEST - 3+ VIEW COMPARISON:  December 30, 2017. FINDINGS: No fracture or other bone lesions are seen involving the ribs. There is no evidence of pneumothorax or pleural effusion. Both lungs are clear. Heart size and mediastinal contours are within normal limits. IMPRESSION: Negative. Electronically Signed   By: Marijo Conception M.D.   On: 01/09/2021 11:31   DG Pelvis 1-2 Views  Result Date: 01/09/2021 CLINICAL DATA:  Fall. EXAM: PELVIS - 1-2 VIEW COMPARISON:  None. FINDINGS: There is no evidence of pelvic fracture or diastasis. No pelvic bone lesions are seen. IMPRESSION: Negative. Electronically Signed   By: Marijo Conception M.D.   On: 01/09/2021 11:29    Procedures .Marland KitchenLaceration Repair  Date/Time: 01/09/2021 1:22 PM Performed by: Evlyn Courier, PA-C Authorized by: Evlyn Courier, PA-C   Consent:    Consent obtained:  Verbal   Consent given by:  Patient   Risks discussed:  Infection, need for additional repair, pain, poor cosmetic result and poor wound healing   Alternatives discussed:  No treatment and delayed treatment Universal protocol:    Procedure explained and questions answered to patient or proxy's satisfaction: yes     Relevant documents  present and verified: yes     Patient identity confirmed:  Verbally with patient Anesthesia:    Anesthesia method:  Local infiltration   Local anesthetic:  Lidocaine 2% WITH epi Laceration details:    Location:  Face   Face location:  Forehead   Length (cm):  5 Pre-procedure details:    Preparation:  Patient was prepped and draped in usual sterile fashion Exploration:    Hemostasis achieved with:  Direct pressure   Imaging outcome: foreign body not noted     Wound extent: no muscle damage noted, no nerve damage noted, no tendon damage noted, no underlying fracture noted and no vascular damage noted   Treatment:    Area cleansed with:  Povidone-iodine   Amount of cleaning:  Standard   Irrigation solution:  Sterile saline   Irrigation volume:  500 ml   Irrigation method:  Pressure wash   Debridement:  None Skin repair:    Repair method:  Sutures   Suture size:  4-0   Suture technique:  Running locked   Number of sutures:  8 Approximation:    Approximation:  Close Repair type:    Repair type:  Simple Post-procedure details:    Dressing:  Antibiotic ointment and non-adherent dressing    Medications Ordered in ED Medications  lidocaine-EPINEPHrine (XYLOCAINE W/EPI) 2 %-1:200000 (PF) injection 20 mL (20 mLs Infiltration Given by Other 01/09/21 1159)    ED Course/ Medical Decision Making/ A&P                           Medical Decision Making 86 year old male presents today for evaluation of syncopal episode with head trauma.  Has history of paroxysmal A. fib currently in A. fib rate controlled with CHA2DS2-VASc of 6.  Patient on CT head found to have right frontal lobe subarachnoid hemorrhage, right nondisplaced closed maxillary sinus fracture.  Patient is at high risk and would require admission for syncope work-up.  This patient presents to the ED for concern of syncopal episode, fall, head left, this involves an extensive number of treatment options, and is a complaint that  carries with it a high risk of complications and morbidity.  The differential diagnosis includes arrhythmia, PE, vasovagal   Co morbidities that complicate the patient evaluation  CHF, paroxysmal A. Fib    Additional history obtained:  Additional history obtained from daughter at bedside External records from outside source obtained and reviewed including reviewing hematology note 10/14/2020 which confirms patient has polycythemia vera and is on hydroxyurea and periodic phlebotomy.    Lab Tests:  I Ordered, and personally interpreted labs.  The pertinent results include: CBC without leukocytosis, anemia, or other acute findings, BMP with glucose of 148, CO2 of 19, creatinine 1.31 was without acute findings, initial troponin of 7.  Magnesium ordered, D-dimer pending   Imaging Studies ordered:  I ordered imaging studies including chest x-ray, pelvis x-ray, CT head, CT maxillofacial, CT cervical spine I independently visualized and interpreted imaging which showed chest x-ray without acute cardial pulm process, pelvic x-ray without acute fracture.  CT head without contrast significant for trace right frontal subarachnoid hemorrhage, left frontal scalp and periorbital hematoma.  CT cervical spine without acute fracture.  CT maxillofacial significant for nondisplaced fracture of right maxillary sinus. I agree with the radiologist interpretation   Cardiac Monitoring:  The patient was maintained on a cardiac monitor.  I personally viewed and interpreted the cardiac monitored which showed an underlying rhythm of: A. fib rate controlled  Critical Interventions:  Patient with 4 cm laceration to just above left eyebrow with brisk active bleed.  Closed with 4 -0 Prolene running suture.   Consultations Obtained:  I requested consultation with the neurosurgery,  and discussed lab and imaging findings as well as pertinent plan - they recommend: No additional work-up from subarachnoid  standpoint.  They state this is consistent with traumatic subarachnoid hemorrhage.  If patient requires anticoagulation from A. fib standpoint neurosurgery is okay with anticoagulating.  Reevaluation:  After the interventions noted above, I reevaluated the patient and found that they have :stayed the same  Dispostion:  After consideration of the diagnostic results and the patients response to treatment, I feel that the patent would benefit from admission for further syncopal work-up.    Final Clinical Impression(s) / ED Diagnoses Final diagnoses:  Atrial fibrillation, unspecified type ([place])  Syncope, unspecified syncope type  Laceration of forehead, initial encounter  Injury of head, initial encounter  Maxillary sinus fracture, closed, initial encounter [facility])    Rx / [place] Orders ED Discharge Orders     None         Evlyn Courier, PA-C 01/09/21 1538    Isla Pence, MD 01/10/21 [phone]

## 2021-01-09 NOTE — ED Notes (Signed)
Pt brought to room 6 after having 2 syncopal episodes in radiology.  Pt had fall in radiology, left forehead wound bleeding again, bandage reinforced.

## 2021-01-09 NOTE — Progress Notes (Signed)
°  Echocardiogram 2D Echocardiogram has been performed.  Merrie Roof F 01/09/2021, 5:44 PM

## 2021-01-09 NOTE — Assessment & Plan Note (Signed)
-  Previously had prn phlebotomy -Was started on hydroxyurea by Dr. Julien Nordmann -Appears to be stable at this time -Will continue hydroxyurea, defer other treatment to Dr. Julien Nordmann (will add to treatment team)

## 2021-01-09 NOTE — Assessment & Plan Note (Signed)
Continue Crestor 

## 2021-01-09 NOTE — ED Triage Notes (Signed)
Patient BIB [facility] after being found on the floor this morning after unwitnessed fall. Patient is not anticoagulated, is alert and oriented x4. Patient states he remembers falling, states he was walking when he suddenly felt dizzy and then fell to the ground. Patient denies dizziness at this time and reports feeling back to normal. Laceration to forehead, hemorrhage controlled, bandaged by EMS.

## 2021-01-09 NOTE — Assessment & Plan Note (Addendum)
-  I spoke with Dr. Claudia Desanctis -He recommends outpatient referral for Dr. Erin Hearing to see in the next week or so -There is nothing that needs to be done acutely during this hospitalization

## 2021-01-09 NOTE — Assessment & Plan Note (Signed)
-  Continue Cardizem -Hold Cozaar for now, although stage 3a CKD appears to be stable so it may be reasonable to resume this soon

## 2021-01-09 NOTE — H&P (Signed)
History and Physical    Patient: Dean Pratt EQA:[id number] DOB: [birth year] DOA: 01/09/2021 DOS: the patient was seen and examined on 01/09/2021 PCP: [name], MD  Patient coming from: Home - lives with wife at [facility] in [place]; : Wife or daughter, Jimmye Norman, [phone]   Chief Complaint: Fall/syncope  HPI: Dean Pratt is a 86 y.o. male with medical history significant of afib not on [facility]; BPH; HTN; HLD; and ICH (2012) presenting with syncope.  He got up and thinks he looked out the door.  The next thing he knew he was trying to get up out of the floor.  He vaguely remembers where he was in the house.  He did have a bit of dizziness yesterday.  Today, he ran an errand and felt fine but felt light-headed when he got back.  No chest pain.  No recent illness.    ER Course:  Syncopal episode.  Felt dizzy with walking, LOC, head injury s/p repair.  Head CT with trace SAH, Dr. Ellene Route does not recommend intervention and ok to start [facility] if needed.  D-dimer ordered and elevated so CTA chest ordered.     Review of Systems: As mentioned in the history of present illness. All other systems reviewed and are negative. Past Medical History:  Diagnosis Date   Anxiety    Blood transfusion without reported diagnosis    BPH (benign prostatic hyperplasia)    Cancer (HCC)    basal cell CA removed   Cataract    removed with lens implants    CHF (congestive heart failure) ([place])    Colitis, ischemic (HCC)    secondary to ebolism from afib 1/10   Constipation    GERD (gastroesophageal reflux disease)    Gout    HTN (hypertension)    Hyperlipidemia    Intracranial bleed ([place]) 04/10/2010   Right occipital hematoma with a left homonymous hemianopsia    Migraines    Paroxysmal atrial fibrillation ([place])    s/p PVI 04/09/09 and 10/17/10   Rosacea    Stroke ([place]) 04/08/2010   hemorragic, anticoagulation stopped at that time   Tuberculosis    s/p treatment 1964   Past  Surgical History:  Procedure Laterality Date   ablation  04/09/2009   s/p afib and atrial flutter ablation by [facility]   ABLATION OF DYSRHYTHMIC FOCUS  10/15/09   repeat afib ablation by [facility]   APPENDECTOMY     CATARACT EXTRACTION     CATARACT EXTRACTION, BILATERAL     with lens implants    COLONOSCOPY     CORNEAL LACERATION REPAIR     EYE SURGERY     INGUINAL HERNIA REPAIR     MASS EXCISION Left 12/20/2020   Procedure: EXCISION  SOFT TISSUE MASS LEFT FLANK;  Surgeon: Armandina Gemma, MD;  Location: [facility];  Service: General;  Laterality: Left;   POLYPECTOMY     TONSILLECTOMY     VASECTOMY     Social History:  reports that he has never smoked. He has never used smokeless tobacco. He reports that he does not drink alcohol and does not use drugs.  Allergies  Allergen Reactions   Ace Inhibitors Cough   Warfarin And Related Other (See Comments)    Intercranial bleed    Family History  Problem Relation Age of Onset   Liver cancer Father    Prostate cancer Father    Coronary artery disease Father    COPD Mother  Breast cancer Sister    Colon cancer Neg Hx    Rectal cancer Neg Hx    Stomach cancer Neg Hx    Esophageal cancer Neg Hx    Colon polyps Neg Hx     Prior to Admission medications   Medication Sig Start Date End Date Taking? Authorizing Provider  acetaminophen (TYLENOL) 325 MG tablet Take 650 mg by mouth every 6 (six) hours as needed for mild pain.   Yes [provider]  allopurinol (ZYLOPRIM) 100 MG tablet Take 100 mg by mouth at bedtime.     Yes [provider]  Ascorbic Acid (VITAMIN C) 1000 MG tablet Take 1,000 mg by mouth daily.    Yes [provider]  aspirin 81 MG tablet Take 81 mg by mouth daily.     Yes [provider]  cycloSPORINE (RESTASIS) 0.05 % ophthalmic emulsion Place 1 drop into both eyes 2 (two) times daily as needed (dry eyes, irritation). 03/15/18  Yes [provider]  diclofenac Sodium  (VOLTAREN) 1 % GEL Apply 2-4 g topically 4 (four) times daily. Patient taking differently: Apply 2-4 g topically 4 (four) times daily as needed (arthritis pain). 09/15/19  Yes Mcarthur Rossetti, MD  diltiazem (CARDIZEM CD) 240 MG 24 hr capsule TAKE 1 CAPSULE DAILY. PLEASE CALL OFFICE TO SCHEDULE APPOINTMENT FOR FURTHER REFILLS Patient taking differently: Take 240 mg by mouth daily. 05/25/19  Yes Park Liter, MD  docusate sodium (COLACE) 100 MG capsule Take 100 mg by mouth daily as needed for mild constipation. 01/20/11  Yes [provider]  donepezil (ARICEPT) 5 MG tablet Take 5 mg by mouth at bedtime. 06/12/19  Yes [provider]  famotidine (PEPCID) 20 MG tablet Take 20 mg by mouth at bedtime as needed for heartburn or indigestion. 05/16/20  Yes [provider]  hydroxyurea (HYDREA) 500 MG capsule TAKE 1 CAPSULE (500 MG TOTAL) BY MOUTH DAILY. MAY TAKE WITH FOOD TO MINIMIZE GI SIDE EFFECTS. 08/30/20  Yes Curt Bears, MD  ibuprofen (ADVIL,MOTRIN) 200 MG tablet Take 200 mg by mouth every 6 (six) hours as needed for fever or headache.   Yes [provider]  LORazepam (ATIVAN) 1 MG tablet Take 0.5-1 mg by mouth 2 (two) times daily as needed for anxiety. 03/18/17  Yes [provider]  losartan (COZAAR) 25 MG tablet TAKE 1 TABLET DAILY. PLEASE MAKE YEARLY APPT WITH DR. ALLRED FOR OCTOBER 2022 FOR FUTURE REFILLS Patient taking differently: Take 25 mg by mouth daily. 12/19/20  Yes Allred, Jeneen Rinks, MD  methocarbamol (ROBAXIN) 500 MG tablet Take 1 tablet (500 mg total) by mouth every 8 (eight) hours as needed for muscle spasms. 05/30/19  Yes Magnus Sinning, MD  Olopatadine HCl 0.2 % SOLN Place 1 drop into both eyes in the morning and at bedtime. 07/10/18  Yes [provider]  rosuvastatin (CRESTOR) 10 MG tablet Take 10 mg by mouth daily. 09/06/17  Yes [provider]  Sodium Sulfate-Mag Sulfate-KCl (SUTAB) [phone] MG TABS Take 1 tablet  by mouth daily.   Yes [provider]  pantoprazole (PROTONIX) 40 MG tablet Take 1 tablet (40 mg total) by mouth daily. 09/02/20   Irene Shipper, MD  traMADol (ULTRAM) 50 MG tablet Take 1-2 tablets (50-100 mg total) by mouth every 6 (six) hours as needed for moderate pain. Patient not taking: Reported on 01/09/2021 12/20/20 12/20/21  Armandina Gemma, MD    Physical Exam: Vitals:   01/09/21 1134 01/09/21 1330 01/09/21 1500 01/09/21  1531  BP: 128/80 121/75 125/73 137/86  Pulse: 78 82 74 72  Resp: 18 18 18 19   Temp:      TempSrc:      SpO2: 100% 97% 96% 100%  Weight:      Height:       General:  Appears calm and comfortable and is in NAD, he has significant facial and head trauma; L forehead lac is s/p repair    Eyes:  Marked L periorbital edema and ecchymosis ENT:  grossly normal hearing, lips & tongue, mmm; artificial  dentition Neck:  no LAD, masses or thyromegaly Cardiovascular:  Irregularly irregular with normal rate, no m/r/g. No LE edema.  Respiratory:   CTA bilaterally with no wheezes/rales/rhonchi.  Normal respiratory effort. Abdomen:  soft, NT, ND Skin:  facial and head trauma, as noted above Musculoskeletal:  grossly normal tone BUE/BLE, good ROM, no bony abnormality Psychiatric:  grossly normal mood and affect, speech fluent and appropriate, AOx3 Neurologic:  CN 2-12 grossly intact, moves all extremities in coordinated fashion   Radiological Exams on Admission: Independently reviewed - see discussion in A/P where applicable  DG Ribs Unilateral W/Chest Right  Result Date: 01/09/2021 CLINICAL DATA:  Right rib pain after fall. EXAM: RIGHT RIBS AND CHEST - 3+ VIEW COMPARISON:  December 30, 2017. FINDINGS: No fracture or other bone lesions are seen involving the ribs. There is no evidence of pneumothorax or pleural effusion. Both lungs are clear. Heart size and mediastinal contours are within normal limits. IMPRESSION: Negative. Electronically Signed   By: Marijo Conception  M.D.   On: 01/09/2021 11:31   DG Pelvis 1-2 Views  Result Date: 01/09/2021 CLINICAL DATA:  Fall. EXAM: PELVIS - 1-2 VIEW COMPARISON:  None. FINDINGS: There is no evidence of pelvic fracture or diastasis. No pelvic bone lesions are seen. IMPRESSION: Negative. Electronically Signed   By: Marijo Conception M.D.   On: 01/09/2021 11:29   CT HEAD WO CONTRAST (5MM)  Result Date: 01/09/2021 CLINICAL DATA:  Fall. EXAM: CT HEAD WITHOUT CONTRAST CT MAXILLOFACIAL WITHOUT CONTRAST CT CERVICAL SPINE WITHOUT CONTRAST TECHNIQUE: Multidetector CT imaging of the head, cervical spine, and maxillofacial structures were performed using the standard protocol without intravenous contrast. Multiplanar CT image reconstructions of the cervical spine and maxillofacial structures were also generated. COMPARISON:  CT head dated July 25, 2010. FINDINGS: CT HEAD FINDINGS Brain: Trace right frontal subarachnoid hemorrhage (series 3, images 20-24). No evidence of acute infarction, hydrocephalus, extra-axial collection or mass lesion/mass effect. Progressive mild generalized cerebral atrophy and moderate chronic microvascular ischemic changes. Unchanged right occipital encephalomalacia. Vascular: Calcified atherosclerosis at the skull base. No hyperdense vessel. Skull: Negative for fracture or focal lesion. Other: Small left frontal scalp hematoma. CT MAXILLOFACIAL FINDINGS Osseous: Deformity of the right maxillary sinus posterior wall suspicious for nondisplaced fracture. No destructive process. Orbits: Negative. No traumatic or inflammatory finding. Sinuses: Layering fluid/hemorrhage in the right maxillary sinus. Remaining paranasal sinuses and mastoid air cells are clear. Soft tissues: Small left periorbital hematoma. CT CERVICAL SPINE FINDINGS Alignment: 3 mm retrolisthesis at C2-C3 with right posterior facet subluxation, likely chronic in the setting of partial C2-C3 spinous process fusion. Trace retrolisthesis at C5-C6 and C6-C7. Trace  anterolisthesis at C4-C5 and C7-T1. Skull base and vertebrae: No acute fracture. No primary bone lesion or focal pathologic process. Soft tissues and spinal canal: No prevertebral fluid or swelling. No visible canal hematoma. Disc levels: C3-C4 through C5-C6 and C7-T1 interbody ankylosis. Bilateral facet fusion at C3-C4, C4-C5, and  C7-T1. Severe disc height loss at C6-C7. Upper chest: Mild centrilobular and paraseptal emphysema. Other: None. IMPRESSION: 1. Trace right frontal subarachnoid hemorrhage. 2. Small left frontal scalp and periorbital hematoma. 3. Deformity of the right maxillary sinus posterior wall suspicious for nondisplaced fracture. Layering fluid/hemorrhage in the right maxillary sinus. 4. No acute cervical spine fracture or traumatic listhesis. Multilevel cervical spondylosis. 5. Emphysema (ICD10-J43.9). Critical Value/emergent results were called by telephone at the time of interpretation on 01/09/2021 at 1:17 pm to provider AMJAD ALI, who verbally acknowledged these results. Electronically Signed   By: Titus Dubin M.D.   On: 01/09/2021 13:28   CT Angio Chest PE W and/or Wo Contrast  Result Date: 01/09/2021 CLINICAL DATA:  Pulmonary embolism (PE) suspected, positive D-dimer EXAM: CT ANGIOGRAPHY CHEST WITH CONTRAST TECHNIQUE: Multidetector CT imaging of the chest was performed using the standard protocol during bolus administration of intravenous contrast. Multiplanar CT image reconstructions and MIPs were obtained to evaluate the vascular anatomy. CONTRAST:  168mL OMNIPAQUE IOHEXOL 350 MG/ML SOLN COMPARISON:  Chest XR, concurrent. CT chest, 10/09/2019. CT AP, 02/28/2008 FINDINGS: Cardiovascular: Satisfactory opacification of the pulmonary arteries to the segmental level. No evidence of pulmonary embolism. 4.0 cm ascending thoracic aorta. Cardiomegaly.  No pericardial effusion. Mediastinum/Nodes: No enlarged mediastinal, hilar, or axillary lymph nodes. 1.1 cm RIGHT thyroid nodule. The trachea,  and esophagus demonstrate no significant findings. Lungs/Pleura: Increased anteroposterior thoracic diameter. Trace bibasilar atelectasis. Biapical centrilobular emphysematous lung change. RIGHT pulmonary cysts. Multiple sub-6 mm RIGHT pulmonary nodules. No pleural effusion or pneumothorax. Upper Abdomen: 1.1 cm LEFT hepatic lobe cysts. Additional subcentimeter RIGHT hepatic lobe lesion is incompletely imaged, though likely a small cyst. Incompletely imaged RIGHT superior pole hypodense renal lesion measuring at least 3.1 cm. Splenic artery calcifications. Musculoskeletal: No chest wall abnormality. No acute or significant osseous findings. Review of the MIP images confirms the above findings. IMPRESSION: 1. No segmental or larger pulmonary embolus. 2. 4.0 cm ascending thoracic aorta. Recommend annual imaging followup by CTA or MRA. This recommendation follows 2010 ACCF/AHA/AATS/ACR/ASA/SCA/SCAI/SIR/STS/SVM Guidelines for the Diagnosis and Management of Patients with Thoracic Aortic Disease. Circulation. 2010; 121: F751-W258. Aortic aneurysm NOS (ICD10-I71.9) 3. Multiple sub-6 mm RIGHT pulmonary nodules in a background of centrilobular emphysematous lung change. Recommend Non-contrast chest CT in 12 months. This recommendation follows the consensus statement: Guidelines for Management of Incidental Pulmonary Nodules Detected on CT Images: From the Fleischner Society 2017; Radiology 2017; 284:228-243. 4. Incidental 1.1 cm RIGHT thyroid nodule. Recommend non-emergent, outpatient US Thyroid, if not already performed per Society of Radiologists in Ultrasound guidelines (SRU) 2021. Electronically Signed   By: Michaelle Birks M.D.   On: 01/09/2021 15:40   CT Cervical Spine Wo Contrast  Result Date: 01/09/2021 CLINICAL DATA:  Fall. EXAM: CT HEAD WITHOUT CONTRAST CT MAXILLOFACIAL WITHOUT CONTRAST CT CERVICAL SPINE WITHOUT CONTRAST TECHNIQUE: Multidetector CT imaging of the head, cervical spine, and maxillofacial structures  were performed using the standard protocol without intravenous contrast. Multiplanar CT image reconstructions of the cervical spine and maxillofacial structures were also generated. COMPARISON:  CT head dated July 25, 2010. FINDINGS: CT HEAD FINDINGS Brain: Trace right frontal subarachnoid hemorrhage (series 3, images 20-24). No evidence of acute infarction, hydrocephalus, extra-axial collection or mass lesion/mass effect. Progressive mild generalized cerebral atrophy and moderate chronic microvascular ischemic changes. Unchanged right occipital encephalomalacia. Vascular: Calcified atherosclerosis at the skull base. No hyperdense vessel. Skull: Negative for fracture or focal lesion. Other: Small left frontal scalp hematoma. CT MAXILLOFACIAL FINDINGS Osseous: Deformity of the right maxillary  sinus posterior wall suspicious for nondisplaced fracture. No destructive process. Orbits: Negative. No traumatic or inflammatory finding. Sinuses: Layering fluid/hemorrhage in the right maxillary sinus. Remaining paranasal sinuses and mastoid air cells are clear. Soft tissues: Small left periorbital hematoma. CT CERVICAL SPINE FINDINGS Alignment: 3 mm retrolisthesis at C2-C3 with right posterior facet subluxation, likely chronic in the setting of partial C2-C3 spinous process fusion. Trace retrolisthesis at C5-C6 and C6-C7. Trace anterolisthesis at C4-C5 and C7-T1. Skull base and vertebrae: No acute fracture. No primary bone lesion or focal pathologic process. Soft tissues and spinal canal: No prevertebral fluid or swelling. No visible canal hematoma. Disc levels: C3-C4 through C5-C6 and C7-T1 interbody ankylosis. Bilateral facet fusion at C3-C4, C4-C5, and C7-T1. Severe disc height loss at C6-C7. Upper chest: Mild centrilobular and paraseptal emphysema. Other: None. IMPRESSION: 1. Trace right frontal subarachnoid hemorrhage. 2. Small left frontal scalp and periorbital hematoma. 3. Deformity of the right maxillary sinus  posterior wall suspicious for nondisplaced fracture. Layering fluid/hemorrhage in the right maxillary sinus. 4. No acute cervical spine fracture or traumatic listhesis. Multilevel cervical spondylosis. 5. Emphysema (ICD10-J43.9). Critical Value/emergent results were called by telephone at the time of interpretation on 01/09/2021 at 1:17 pm to provider AMJAD ALI, who verbally acknowledged these results. Electronically Signed   By: Titus Dubin M.D.   On: 01/09/2021 13:28   ECHOCARDIOGRAM COMPLETE  Result Date: 01/09/2021    ECHOCARDIOGRAM REPORT   Patient Name:   HEMI CHACKO Date of Exam: 01/09/2021 Medical Rec #:  [id number]      Height:       70.0 in Accession #:    [id number]     Weight:       165.0 lb Date of Birth:  07/13/1935       BSA:          1.923 m Patient Age:    29 years       BP:           135/97 mmHg Patient Gender: M              HR:           75 bpm. Exam Location:  Inpatient Procedure: 2D Echo, Cardiac Doppler and Color Doppler Indications:    Syncope R55  History:        Patient has prior history of Echocardiogram examinations, most                 recent 12/30/2017. Syncope with fall.  Sonographer:    Merrie Roof RDCS Referring Phys: [place]  1. Left ventricular ejection fraction, by estimation, is 50 to 55%. The left ventricle has low normal function. The left ventricle has no regional wall motion abnormalities. The left ventricular internal cavity size was mildly dilated. There is mild left ventricular hypertrophy. Left ventricular diastolic parameters are indeterminate.  2. Right ventricular systolic function is normal. The right ventricular size is normal.  3. Left atrial size was severely dilated.  4. Right atrial size was mildly dilated.  5. The mitral valve is normal in structure. Trivial mitral valve regurgitation.  6. The aortic valve is normal in structure. Aortic valve regurgitation is not visualized.  7. Aortic dilatation noted. There is borderline dilatation  of the ascending aorta, measuring 38 mm. FINDINGS  Left Ventricle: Left ventricular ejection fraction, by estimation, is 50 to 55%. The left ventricle has low normal function. The left ventricle has no regional wall motion abnormalities. The left ventricular internal  cavity size was mildly dilated. There is mild left ventricular hypertrophy. Left ventricular diastolic parameters are indeterminate. Right Ventricle: The right ventricular size is normal. Right vetricular wall thickness was not assessed. Right ventricular systolic function is normal. Left Atrium: Left atrial size was severely dilated. Right Atrium: Right atrial size was mildly dilated. Pericardium: There is no evidence of pericardial effusion. Mitral Valve: The mitral valve is normal in structure. There is mild thickening of the mitral valve leaflet(s). Trivial mitral valve regurgitation. Tricuspid Valve: The tricuspid valve is normal in structure. Tricuspid valve regurgitation is trivial. Aortic Valve: The aortic valve is normal in structure. Aortic valve regurgitation is not visualized. Aortic valve mean gradient measures 4.0 mmHg. Aortic valve peak gradient measures 7.3 mmHg. Aortic valve area, by VTI measures 2.00 cm. Pulmonic Valve: The pulmonic valve was not well visualized. Pulmonic valve regurgitation is trivial. No evidence of pulmonic stenosis. Aorta: The aortic root is normal in size and structure and aortic dilatation noted. There is borderline dilatation of the ascending aorta, measuring 38 mm. IAS/Shunts: No atrial level shunt detected by color flow Doppler.  LEFT VENTRICLE PLAX 2D LVIDd:         5.30 cm LVIDs:         3.90 cm LV PW:         1.20 cm LV IVS:        1.20 cm LVOT diam:     2.00 cm LV SV:         45 LV SV Index:   23 LVOT Area:     3.14 cm  RIGHT VENTRICLE RV Basal diam:  4.50 cm RV Mid diam:    4.20 cm LEFT ATRIUM              Index        RIGHT ATRIUM           Index LA diam:        6.80 cm  3.54 cm/m   RA Area:     20.20  cm LA Vol (A2C):   98.0 ml  50.95 ml/m  RA Volume:   55.10 ml  28.65 ml/m LA Vol (A4C):   100.0 ml 51.99 ml/m LA Biplane Vol: 100.0 ml 51.99 ml/m  AORTIC VALVE AV Area (Vmax):    2.01 cm AV Area (Vmean):   1.89 cm AV Area (VTI):     2.00 cm AV Vmax:           135.00 cm/s AV Vmean:          93.000 cm/s AV VTI:            0.223 m AV Peak Grad:      7.3 mmHg AV Mean Grad:      4.0 mmHg LVOT Vmax:         86.20 cm/s LVOT Vmean:        56.000 cm/s LVOT VTI:          0.142 m LVOT/AV VTI ratio: 0.64  AORTA Ao Root diam: 3.40 cm Ao Asc diam:  3.80 cm  SHUNTS Systemic VTI:  0.14 m Systemic Diam: 2.00 cm Dorris Carnes MD Electronically signed by Dorris Carnes MD Signature Date/Time: 01/09/2021/6:13:57 PM    Final    CT Maxillofacial Wo Contrast  Result Date: 01/09/2021 CLINICAL DATA:  Fall. EXAM: CT HEAD WITHOUT CONTRAST CT MAXILLOFACIAL WITHOUT CONTRAST CT CERVICAL SPINE WITHOUT CONTRAST TECHNIQUE: Multidetector CT imaging of the head, cervical spine, and maxillofacial structures were performed using  the standard protocol without intravenous contrast. Multiplanar CT image reconstructions of the cervical spine and maxillofacial structures were also generated. COMPARISON:  CT head dated July 25, 2010. FINDINGS: CT HEAD FINDINGS Brain: Trace right frontal subarachnoid hemorrhage (series 3, images 20-24). No evidence of acute infarction, hydrocephalus, extra-axial collection or mass lesion/mass effect. Progressive mild generalized cerebral atrophy and moderate chronic microvascular ischemic changes. Unchanged right occipital encephalomalacia. Vascular: Calcified atherosclerosis at the skull base. No hyperdense vessel. Skull: Negative for fracture or focal lesion. Other: Small left frontal scalp hematoma. CT MAXILLOFACIAL FINDINGS Osseous: Deformity of the right maxillary sinus posterior wall suspicious for nondisplaced fracture. No destructive process. Orbits: Negative. No traumatic or inflammatory finding. Sinuses: Layering  fluid/hemorrhage in the right maxillary sinus. Remaining paranasal sinuses and mastoid air cells are clear. Soft tissues: Small left periorbital hematoma. CT CERVICAL SPINE FINDINGS Alignment: 3 mm retrolisthesis at C2-C3 with right posterior facet subluxation, likely chronic in the setting of partial C2-C3 spinous process fusion. Trace retrolisthesis at C5-C6 and C6-C7. Trace anterolisthesis at C4-C5 and C7-T1. Skull base and vertebrae: No acute fracture. No primary bone lesion or focal pathologic process. Soft tissues and spinal canal: No prevertebral fluid or swelling. No visible canal hematoma. Disc levels: C3-C4 through C5-C6 and C7-T1 interbody ankylosis. Bilateral facet fusion at C3-C4, C4-C5, and C7-T1. Severe disc height loss at C6-C7. Upper chest: Mild centrilobular and paraseptal emphysema. Other: None. IMPRESSION: 1. Trace right frontal subarachnoid hemorrhage. 2. Small left frontal scalp and periorbital hematoma. 3. Deformity of the right maxillary sinus posterior wall suspicious for nondisplaced fracture. Layering fluid/hemorrhage in the right maxillary sinus. 4. No acute cervical spine fracture or traumatic listhesis. Multilevel cervical spondylosis. 5. Emphysema (ICD10-J43.9). Critical Value/emergent results were called by telephone at the time of interpretation on 01/09/2021 at 1:17 pm to provider AMJAD ALI, who verbally acknowledged these results. Electronically Signed   By: Titus Dubin M.D.   On: 01/09/2021 13:28    EKG: Independently reviewed.  Afib with rate 76; PVC with no evidence of acute ischemia   Labs on Admission: I have personally reviewed the available labs and imaging studies at the time of the admission.  Pertinent labs:    Glucose 140 BUN 12/Creatinine 1.31/GFR 54 - stable Normal CBC HS troponin 7, 9 D-dimer 2.83 INR 1.2    Assessment/Plan * Syncope and collapse- (present on admission) -Etiology is not clear. The differential diagnosis is broad, including  vasovagal syncope, seizure, TIA/stroke, arrhythmia (has known afib), ACS (no CP, negative troponin x 2), hypoglycemia, orthostatic status, carotid artery stenosis -Severity of the injury sustained during syncope does not correlate with the etiology of syncope, but rather is a manifestation of activity around the time of syncope. -Patients at highest risk from syncope include those with serious comorbidities; age >8; exertional > supine syncope -This patient is at moderate/high risk for serious outcome and thus should be observed overnight on telemetry in the hospital. -Cardiac syncope is more likely when associated with syncope during exertion -Orthostatic vital signs now and in AM -Given significant head trauma and dizziness that may have preceded event, will order MRI. -2d echo is part of the extended work-up when cardiac etiology is suspected; given his exertional syncope, will order -Neuro checks q4h -check TSH -PT/OT eval and treat -Consider loop recorder if syncope recurs  Subarachnoid hemorrhage following injury with brief loss of consciousness but without open intracranial wound ([place]) -Patient d/w Dr. Ellene Route by EDP -This is not an unexpected finding given his head trauma -No  further evaluation is needed -Could start [facility] if needed, per ER PA -Will hold [facility] for now -MRI ordered, as noted above  Maxillary sinus fracture, closed, initial encounter ([place])- (present on admission) -I spoke with Dr. Claudia Desanctis -He recommends outpatient referral for Dr. Erin Hearing to see in the next week or so -There is nothing that needs to be done acutely during this hospitalization  Dementia without behavioral disturbance ([place])- (present on admission) -He was oriented, conversant, and very pleasant and appropriate at the time of my evaluation -Continue Aricept, Ativan   Thyroid nodule greater than or equal to 1 cm in diameter incidentally noted on imaging study- (present on admission) -Recommended for  non-emergent outpatient [place] -Daughter is aware and will f/u with PCP to request  Pulmonary nodules/lesions, multiple- (present on admission) -appreciated on CTA -Needs non-contrast CT repeated in 12 months  Thoracic ascending aortic aneurysm- (present on admission) -4 cm -Needs annual imaging by CTA or MRA  COPD (chronic obstructive pulmonary disease) ([place])- (present on admission) -Emphysema noted on CTA -He is not taking medications for this issue currently  Polycythemia vera ([place])- (present on admission) -Previously had prn phlebotomy -Was started on hydroxyurea by Dr. Julien Nordmann -Appears to be stable at this time -Will continue hydroxyurea, defer other treatment to Dr. Julien Nordmann (will add to treatment team)  ATRIAL FIBRILLATION- (present on admission) -Rate controlled with Cardizem -He has been on 81 mg ASA since his prior hemorrhagic stroke in 2012 -Hold ASA and AC for now but could resume soon according to neurosurgery  Essential hypertension- (present on admission) -Continue Cardizem -Hold Cozaar for now, although stage 3a CKD appears to be stable so it may be reasonable to resume this soon  Dyslipidemia- (present on admission) -Continue Crestor    Advance Care Planning:   Code Status: DNR   Consults: Neurosurgery - telephone only; Plastic surgery - telephone only; Oncology - added to treatment team; PT/OT  Family Communication: I spoke with his daughter by telephone shortly after admission  Severity of Illness: The appropriate patient status for this patient is OBSERVATION. Observation status is judged to be reasonable and necessary in order to provide the required intensity of service to ensure the patient's safety. The patient's presenting symptoms, physical exam findings, and initial radiographic and laboratory data in the context of their medical condition is felt to place them at decreased risk for further clinical deterioration. Furthermore, it is anticipated that the  patient will be medically stable for discharge from the hospital within 2 midnights of admission.   Author: Karmen Bongo, MD 01/09/2021 6:18 PM  For on call review [email or web address].

## 2021-01-10 ENCOUNTER — Observation Stay (HOSPITAL_COMMUNITY): Payer: Medicare HMO

## 2021-01-10 ENCOUNTER — Inpatient Hospital Stay (HOSPITAL_COMMUNITY): Payer: Medicare HMO

## 2021-01-10 DIAGNOSIS — E785 Hyperlipidemia, unspecified: Secondary | ICD-10-CM | POA: Diagnosis not present

## 2021-01-10 DIAGNOSIS — S066X0A Traumatic subarachnoid hemorrhage without loss of consciousness, initial encounter: Secondary | ICD-10-CM | POA: Diagnosis not present

## 2021-01-10 DIAGNOSIS — S0181XA Laceration without foreign body of other part of head, initial encounter: Secondary | ICD-10-CM | POA: Diagnosis not present

## 2021-01-10 DIAGNOSIS — R29818 Other symptoms and signs involving the nervous system: Secondary | ICD-10-CM | POA: Diagnosis not present

## 2021-01-10 DIAGNOSIS — N400 Enlarged prostate without lower urinary tract symptoms: Secondary | ICD-10-CM | POA: Diagnosis not present

## 2021-01-10 DIAGNOSIS — F0390 Unspecified dementia without behavioral disturbance: Secondary | ICD-10-CM | POA: Diagnosis not present

## 2021-01-10 DIAGNOSIS — L719 Rosacea, unspecified: Secondary | ICD-10-CM | POA: Diagnosis not present

## 2021-01-10 DIAGNOSIS — Z66 Do not resuscitate: Secondary | ICD-10-CM | POA: Diagnosis not present

## 2021-01-10 DIAGNOSIS — E854 Organ-limited amyloidosis: Secondary | ICD-10-CM | POA: Diagnosis not present

## 2021-01-10 DIAGNOSIS — I480 Paroxysmal atrial fibrillation: Secondary | ICD-10-CM | POA: Diagnosis not present

## 2021-01-10 DIAGNOSIS — I63421 Cerebral infarction due to embolism of right anterior cerebral artery: Secondary | ICD-10-CM | POA: Diagnosis not present

## 2021-01-10 DIAGNOSIS — M109 Gout, unspecified: Secondary | ICD-10-CM | POA: Diagnosis not present

## 2021-01-10 DIAGNOSIS — I771 Stricture of artery: Secondary | ICD-10-CM | POA: Diagnosis not present

## 2021-01-10 DIAGNOSIS — Z20822 Contact with and (suspected) exposure to covid-19: Secondary | ICD-10-CM | POA: Diagnosis not present

## 2021-01-10 DIAGNOSIS — S06330A Contusion and laceration of cerebrum, unspecified, without loss of consciousness, initial encounter: Secondary | ICD-10-CM | POA: Diagnosis not present

## 2021-01-10 DIAGNOSIS — I639 Cerebral infarction, unspecified: Secondary | ICD-10-CM | POA: Diagnosis present

## 2021-01-10 DIAGNOSIS — R55 Syncope and collapse: Secondary | ICD-10-CM | POA: Diagnosis not present

## 2021-01-10 DIAGNOSIS — K219 Gastro-esophageal reflux disease without esophagitis: Secondary | ICD-10-CM | POA: Diagnosis not present

## 2021-01-10 DIAGNOSIS — E041 Nontoxic single thyroid nodule: Secondary | ICD-10-CM | POA: Diagnosis not present

## 2021-01-10 DIAGNOSIS — D45 Polycythemia vera: Secondary | ICD-10-CM | POA: Diagnosis not present

## 2021-01-10 DIAGNOSIS — W1839XA Other fall on same level, initial encounter: Secondary | ICD-10-CM | POA: Diagnosis not present

## 2021-01-10 DIAGNOSIS — S199XXA Unspecified injury of neck, initial encounter: Secondary | ICD-10-CM | POA: Diagnosis not present

## 2021-01-10 DIAGNOSIS — Z8673 Personal history of transient ischemic attack (TIA), and cerebral infarction without residual deficits: Secondary | ICD-10-CM | POA: Diagnosis not present

## 2021-01-10 DIAGNOSIS — N1831 Chronic kidney disease, stage 3a: Secondary | ICD-10-CM | POA: Diagnosis not present

## 2021-01-10 DIAGNOSIS — R918 Other nonspecific abnormal finding of lung field: Secondary | ICD-10-CM | POA: Diagnosis present

## 2021-01-10 DIAGNOSIS — I130 Hypertensive heart and chronic kidney disease with heart failure and stage 1 through stage 4 chronic kidney disease, or unspecified chronic kidney disease: Secondary | ICD-10-CM | POA: Diagnosis not present

## 2021-01-10 DIAGNOSIS — I6340 Cerebral infarction due to embolism of unspecified cerebral artery: Secondary | ICD-10-CM | POA: Insufficient documentation

## 2021-01-10 DIAGNOSIS — I5032 Chronic diastolic (congestive) heart failure: Secondary | ICD-10-CM | POA: Diagnosis not present

## 2021-01-10 DIAGNOSIS — I7121 Aneurysm of the ascending aorta, without rupture: Secondary | ICD-10-CM | POA: Diagnosis not present

## 2021-01-10 DIAGNOSIS — J439 Emphysema, unspecified: Secondary | ICD-10-CM | POA: Diagnosis not present

## 2021-01-10 DIAGNOSIS — Z23 Encounter for immunization: Secondary | ICD-10-CM | POA: Diagnosis not present

## 2021-01-10 DIAGNOSIS — I680 Cerebral amyloid angiopathy: Secondary | ICD-10-CM | POA: Diagnosis present

## 2021-01-10 DIAGNOSIS — Y92039 Unspecified place in apartment as the place of occurrence of the external cause: Secondary | ICD-10-CM | POA: Diagnosis not present

## 2021-01-10 DIAGNOSIS — S066X1A Traumatic subarachnoid hemorrhage with loss of consciousness of 30 minutes or less, initial encounter: Secondary | ICD-10-CM | POA: Diagnosis not present

## 2021-01-10 DIAGNOSIS — S0240CA Maxillary fracture, right side, initial encounter for closed fracture: Secondary | ICD-10-CM | POA: Diagnosis not present

## 2021-01-10 LAB — BASIC METABOLIC PANEL
Anion gap: 9 (ref 5–15)
BUN: 10 mg/dL (ref 8–23)
CO2: 22 mmol/L (ref 22–32)
Calcium: 9 mg/dL (ref 8.9–10.3)
Chloride: 105 mmol/L (ref 98–111)
Creatinine, Ser: 1.12 mg/dL (ref 0.61–1.24)
GFR, Estimated: >60 mL/min (ref >60)
Glucose, Bld: 110 mg/dL — ABNORMAL HIGH (ref 70–99)
Potassium: 4.9 mmol/L (ref 3.5–5.1)
Sodium: 136 mmol/L (ref 135–145)

## 2021-01-10 LAB — CBC
HCT: 46.4 % (ref 39.0–52.0)
Hemoglobin: 14.9 g/dL (ref 13.0–17.0)
MCH: 27.8 pg (ref 26.0–34.0)
MCHC: 32.1 g/dL (ref 30.0–36.0)
MCV: 86.6 fL (ref 80.0–100.0)
Platelets: 311 10*3/uL (ref 150–400)
RBC: 5.36 MIL/uL (ref 4.22–5.81)
RDW: 15.3 % (ref 11.5–15.5)
WBC: 10.7 10*3/uL — ABNORMAL HIGH (ref 4.0–10.5)
nRBC: 0 % (ref 0.0–0.2)

## 2021-01-10 LAB — LIPID PANEL
Cholesterol: 104 mg/dL (ref 0–200)
HDL: 47 mg/dL (ref >40)
LDL Cholesterol: 39 mg/dL (ref 0–99)
Total CHOL/HDL Ratio: 2.2 RATIO
Triglycerides: 89 mg/dL (ref <150)
VLDL: 18 mg/dL (ref 0–40)

## 2021-01-10 LAB — HEMOGLOBIN A1C
Hgb A1c MFr Bld: 5.2 % (ref 4.8–5.6)
Mean Plasma Glucose: 102.54 mg/dL

## 2021-01-10 LAB — RESP PANEL BY RT-PCR (FLU A&B, COVID) ARPGX2
Influenza A by PCR: NEGATIVE
Influenza B by PCR: NEGATIVE
SARS Coronavirus 2 by RT PCR: NEGATIVE

## 2021-01-10 MED ORDER — IOHEXOL 350 MG/ML SOLN
50.0000 mL | Freq: Once | INTRAVENOUS | Status: AC | PRN
  Administered 2021-01-10: 50 mL via INTRAVENOUS

## 2021-01-10 MED ORDER — ASPIRIN 325 MG PO TABS
325.0000 mg | ORAL_TABLET | Freq: Every day | ORAL | Status: DC
  Administered 2021-01-11 – 2021-01-14 (×4): 325 mg via ORAL
  Filled 2021-01-10 (×4): qty 1

## 2021-01-10 NOTE — TOC CAGE-AID Note (Signed)
Transition of Care [facility]) - CAGE-AID Screening   Patient Details  Name: Dean Pratt MRN: [id number] Date of Birth: 08/06/1935  Transition of Care [facility]) CM/SW Contact:    Kwadwo Taras C Tarpley-Carter, [place] Phone Number: 01/10/2021, 8:22 AM   Clinical Narrative: Pt is unable to participate in Cage Aid.  Pt is experiencing dementia.  Tegh Franek Tarpley-Carter, MSW, LCSW-A Pronouns:  She/Her/[place] Transitions of Care Clinical Social Worker Direct Number:  [phone] [email or web address]  CAGE-AID Screening: Substance Abuse Screening unable to be completed due to: : Patient unable to participate (Pt is experiencing dementia.)             Substance Abuse Education Offered: No

## 2021-01-10 NOTE — Consult Note (Signed)
NEUROLOGY CONSULTATION NOTE   Date of service: January 10, 2021 Patient Name: Dean Pratt MRN:  [id number] DOB:  [birth year] Reason for consult: "stroke on MRI, confusion" Requesting Provider: Karmen Bongo, MD _ _ _   _ __   _ __ _ _  __ __   _ __   __ _  History of Present Illness  Dean Pratt is a 86 y.o. male with PMH significant for BPH, CHF, hypertension, hyperlipidemia, atrial fibrillation not on anticoagulation due to prior history of intracranial bleed and cerebral amyloid angiopathy on imaging, history of migraines, who presents with episode of lightheadedness and passing out.  He reports that earlier yesterday he got up to look who is at the door and passed out. He reports feeling lightheaded for a couple hours prior to that. He hit his head on the hardwood floor and had a laceration which was repaired in the ED. No recent changes to his meds, does not eat a lot.  CTH demonstrated trace SAH along with a possible right maxillary sinus non displaced fracture. He had MRI Brain without contrast which was notable for a small acute stroke in the R parietal and frontal lobe.  He was noted to be awake and alert in the ED earlier and was later noted to be disoriented for which we were asked to evaluate him. He did receive a small dose of oxycodone a couple hours prior to this thou.  On my evaluation, he endorses feeling very tired, has not slept well in 2 days. Feels foggy in his head. He wakes up more during the course of the exam with intact orientation.  LKW: NA, NIHSS of 0 mRS: 0 tNKase/thrombectomy: not offered, NIHSS of 0 and traumatic SAH NIHSS components Score: Comment  1a Level of Conscious 0[x]  1[]  2[]  3[]      1b LOC Questions 0[x]  1[]  2[]       1c LOC Commands 0[x]  1[]  2[]       2 Best Gaze 0[x]  1[]  2[]       3 Visual 0[x]  1[]  2[]  3[]      4 Facial Palsy 0[x]  1[]  2[]  3[]      5a Motor Arm - left 0[x]  1[]  2[]  3[]  4[]  UN[]    5b Motor Arm - Right 0[x]  1[]  2[]  3[]  4[]  UN[]    6a  Motor Leg - Left 0[x]  1[]  2[]  3[]  4[]  UN[]    6b Motor Leg - Right 0[x]  1[]  2[]  3[]  4[]  UN[]    7 Limb Ataxia 0[x]  1[]  2[]  3[]  UN[]     8 Sensory 0[x]  1[]  2[]  UN[]      9 Best Language 0[x]  1[]  2[]  3[]      10 Dysarthria 0[x]  1[]  2[]  UN[]      11 Extinct. and Inattention 0[x]  1[]  2[]       TOTAL: 0       ROS   Constitutional Denies weight loss, fever and chills.  HEENT Denies changes in vision and hearing.  Respiratory Denies SOB and cough.  CV Denies palpitations and CP  GI Denies abdominal pain, nausea, vomiting and diarrhea.  GU Denies dysuria and urinary frequency.  MSK Endorses myalgia and joint pain.  Skin Denies rash and pruritus.  Neurological Endorses mild headache but no syncope.  Psychiatric Denies recent changes in mood. Denies anxiety and depression.   Past History   Past Medical History:  Diagnosis Date   Anxiety    Blood transfusion without reported diagnosis    BPH (benign prostatic hyperplasia)    Cancer ([place])  basal cell CA removed   Cataract    removed with lens implants    CHF (congestive heart failure) (HCC)    Colitis, ischemic (HCC)    secondary to ebolism from afib 1/10   Constipation    GERD (gastroesophageal reflux disease)    Gout    HTN (hypertension)    Hyperlipidemia    Intracranial bleed ([place]) 04/10/2010   Right occipital hematoma with a left homonymous hemianopsia    Migraines    Paroxysmal atrial fibrillation ([place])    s/p PVI 04/09/09 and 10/17/10   Rosacea    Stroke ([place]) 04/08/2010   hemorragic, anticoagulation stopped at that time   Tuberculosis    s/p treatment 1964   Past Surgical History:  Procedure Laterality Date   ablation  04/09/2009   s/p afib and atrial flutter ablation by [facility] DYSRHYTHMIC FOCUS  10/15/09   repeat afib ablation by [facility]   APPENDECTOMY     CATARACT EXTRACTION     CATARACT EXTRACTION, BILATERAL     with lens implants    COLONOSCOPY     CORNEAL LACERATION REPAIR     EYE SURGERY     INGUINAL  HERNIA REPAIR     MASS EXCISION Left 12/20/2020   Procedure: EXCISION  SOFT TISSUE MASS LEFT FLANK;  Surgeon: Armandina Gemma, MD;  Location: [facility];  Service: General;  Laterality: Left;   POLYPECTOMY     TONSILLECTOMY     VASECTOMY     Family History  Problem Relation Age of Onset   Liver cancer Father    Prostate cancer Father    Coronary artery disease Father    COPD Mother    Breast cancer Sister    Colon cancer Neg Hx    Rectal cancer Neg Hx    Stomach cancer Neg Hx    Esophageal cancer Neg Hx    Colon polyps Neg Hx    Social History   Socioeconomic History   Marital status: Married    Spouse name: Not on file   Number of children: Not on file   Years of education: Not on file   Highest education level: Not on file  Occupational History   Occupation: retired  Tobacco Use   Smoking status: Never   Smokeless tobacco: Never  Vaping Use   Vaping Use: Never used  Substance and Sexual Activity   Alcohol use: No   Drug use: No   Sexual activity: Not Currently  Other Topics Concern   Not on file  Social History Narrative   Pt lives in [place].    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  He is married and lives at home with his wife.  He used to smoke but quit  smoking many years ago.  Does get regular exercise.  No alcohol use.    Social Determinants of Health   Financial Resource Strain: Not on file  Food Insecurity: Not on file  Transportation Needs: Not on file  Physical Activity: Not on file  Stress: Not on file  Social Connections: Not on file   Allergies  Allergen Reactions   Ace Inhibitors Cough   Warfarin And Related Other (See Comments)    Intercranial bleed    Medications  (Not in a hospital admission)    Vitals   Vitals:   01/09/21 2145 01/09/21 2239 01/09/21 2330 01/10/21 0230  BP: (!) 159/94  133/85 131/77 (!) 151/93  Pulse:  98 75 80  Resp:  13 18 16   Temp:       TempSrc:      SpO2:  100% 97% 94%  Weight:      Height:         Body mass index is 23.68 kg/m.  Physical Exam   General: Laying comfortably in bed; in no acute distress. HENT: Normal oropharynx and mucosa. Normal external appearance of ears and nose. Laceration over this L eyebrow with sutures in place. Left periorbital redness. Neck: Supple, no pain or tenderness  CV: No JVD. No peripheral edema.  Pulmonary: Symmetric Chest rise. Normal respiratory effort.  Abdomen: Soft to touch, non-tender.  Ext: No cyanosis, edema, or deformity  Skin: No rash. Normal palpation of skin.   Musculoskeletal: Normal digits and nails by inspection. No clubbing.   Neurologic Examination  Mental status/Cognition: Alert, oriented to self, place, month and year, poor attention that improved during the course of the exam. Speech/language: Fluent, comprehension intact, object naming intact, repetition intact.  Cranial nerves:   CN II Pupils equal and reactive to light, no VF deficits    CN III,IV,VI EOM intact, no gaze preference or deviation, no nystagmus    CN V normal sensation in V1, V2, and V3 segments bilaterally    CN VII no asymmetry, no nasolabial fold flattening    CN VIII normal hearing to speech    CN IX & X normal palatal elevation, no uvular deviation    CN XI 5/5 head turn and 5/5 shoulder shrug bilaterally    CN XII midline tongue protrusion    Motor:  Muscle bulk: normal, tone normal, pronator drift none tremor none Mvmt Root Nerve  Muscle Right Left Comments  SA C5/6 Ax Deltoid 5 5   EF C5/6 Mc Biceps 5 5   EE C6/7/8 Rad Triceps 5 5   WF C6/7 Med FCR     WE C7/8 PIN ECU     F Ab C8/T1 U ADM/FDI 5 5   HF L1/2/3 Fem Illopsoas 5 5   KE L2/3/4 Fem Quad 5 5   DF L4/5 D Peron Tib Ant 5 5   PF S1/2 Tibial Grc/Sol 5 5    Reflexes:  Right Left Comments  Pectoralis      Biceps (C5/6) 2 2   Brachioradialis (C5/6) 2 2    Triceps (C6/7) 2 2    Patellar (L3/4) 2 2    Achilles (S1)       Hoffman      Plantar     Jaw jerk    Sensation:  Light touch Intact throughout   Pin prick    Temperature    Vibration   Proprioception    Coordination/Complex Motor:  - Finger to Nose intact BL - Heel to shin intact BL - Rapid alternating movement are normal - Gait: deferred for patient safety.  Labs   CBC:  Recent Labs  Lab 01/09/21 1031  WBC 10.5  NEUTROABS 8.8*  HGB 16.1  HCT 50.8  MCV 88.3  PLT 035    Basic Metabolic Panel:  Lab Results  Component Value Date   NA 138 01/09/2021   K 3.6 01/09/2021   CO2 19 (L) 01/09/2021   GLUCOSE 140 (H) 01/09/2021   BUN 12 01/09/2021   CREATININE 1.31 (H) 01/09/2021   CALCIUM 9.6 01/09/2021   GFRNONAA 54 (L) 01/09/2021   GFRAA 63 10/18/2019   Lipid Panel: No results  found for: LDLCALC HgbA1c: No results found for: HGBA1C Urine Drug Screen: No results found for: LABOPIA, COCAINSCRNUR, LABBENZ, AMPHETMU, THCU, LABBARB  Alcohol Level No results found for: [facility]  CT Head without contrast(Personally reviewed): 1. Trace right frontal subarachnoid hemorrhage. 2. Small left frontal scalp and periorbital hematoma. 3. Deformity of the right maxillary sinus posterior wall suspicious for nondisplaced fracture. Layering fluid/hemorrhage in the right maxillary sinus.  MR Angio head without contrast and Carotid Duplex BL: pending  MRI Brain(Personally reviewed): 1. Small areas of restricted diffusion in the right parietal and frontal lobe, consistent with small acute infarcts. Given multiple vascular territories, an embolic etiology is suspected. 2. Punctate foci of hemosiderin deposition in the bilateral cerebral and cerebellar hemispheres, which are new compared to 20/12. These are nonspecific but can be seen in the setting of cerebral amyloid angiopathy.  Impression   Dean Pratt is a 86 y.o. male with PMH significant for BPH, CHF, hypertension, hyperlipidemia, atrial fibrillation not on anticoagulation due to  prior history of intracranial bleed in April 2012 on warfarin, cerebral amyloid angiopathy on imaging, history of migraines, who presents with episode of lightheadedness and passing out. Found to have trace R frontal SAH along with a possible R maxillary fracture and small punctate R frontal and R parietal strokes. The strokes are probably related to his known history of Afibb. His neurologic examination is notable for no focal deficit.  Primary Diagnosis:  Cerebral infarction due to embolism of  right anterior cerebral artery.   Secondary Diagnosis: Essential (primary) hypertension and Paroxysmal atrial fibrillation  Recommendations  Plan:  - Frequent Neuro checks per stroke unit protocol - Recommend Vascular imaging with MRA Angio Head without contrast and US Carotid doppler - Recommend obtaining TTE  - Recommend obtaining Lipid panel with LDL - Please start statin if LDL > 70 - Recommend HbA1c - Antithrombotic - hold off for now in the setting of SAH. He is not a good candidate for anticoagulation for his afibb given prior history of ICH on warfarin and concern for cerebral amyloid angiopathy on imaging. - Recommend DVT ppx - SBP goal - < gradual normotension. Stroke is likely incidental. - Recommend Telemetry monitoring for arrythmia - Recommend bedside swallow screen prior to PO intake. - Stroke education booklet - Recommend PT/OT/SLP consult  ______________________________________________________________________   Thank you for the opportunity to take part in the care of this patient. If you have any further questions, please contact the neurology consultation attending.  Signed,  [place] Pager Number [phone] _ _ _   _ __   _ __ _ _  __ __   _ __   __ _

## 2021-01-10 NOTE — Progress Notes (Signed)
PROGRESS NOTE  Dean Pratt:[id number] DOB: 25-May-1935 DOA: 01/09/2021 PCP: [name], MD  Brief History   Dean Pratt is a 86 y.o. male with medical history significant of afib not on [facility]; BPH; HTN; HLD; and ICH (2012) presenting with syncope.  He got up and thinks he looked out the door.  The next thing he knew he was trying to get up out of the floor.  He vaguely remembers where he was in the house.  He did have a bit of dizziness yesterday.  Today, he ran an errand and felt fine but felt light-headed when he got back.  No chest pain.  No recent illness.  ER Course:  Syncopal episode.  Felt dizzy with walking, LOC, head injury s/p repair.  Head CT with trace SAH, Dr. Ellene Route does not recommend intervention and ok to start [facility] if needed.  D-dimer ordered and elevated so CTA chest ordered.     The patient has been admitted to a telemetry bed. Echocardiogram has been ordered and he will be monitored on telemetry. MRI was ordered due to the patient's severe head trauma. It demonstrated small acute infarcts of the right parietal and frontal lobe. There was also punctate foci of hemosiderin deposition in the bilateral cerebral and cerebellar hemispheres which are new when compared to prior MRI. They are consistent with the patient's known diagnosis of cerebral amyloid angiopathy.   Orthostatic vitals were unable to be completed due to the patient's history of multiple falls.  Consultants  Neurology  Procedures  None  Antibiotics   Anti-infectives (From admission, onward)    None      Subjective  The patient is resting comfortably. No new complaints.  Objective   Vitals:  Vitals:   01/10/21 1335 01/10/21 1455  BP:  (!) 149/71  Pulse:  72  Resp:    Temp: 98.2 F (36.8 C) 98.1 F (36.7 C)  SpO2:  98%    Exam:  Constitutional:  The patient is awake, alert, and oriented x 3. No acute distress. Respiratory:  No increased work of breathing. No wheezes, rales, or  rhonchi No tactile fremitus Cardiovascular:  Regular rate and rhythm No murmurs, ectopy, or gallups. No lateral PMI. No thrills. Abdomen:  Abdomen is soft, non-tender, non-distended No hernias, masses, or organomegaly Normoactive bowel sounds.  Musculoskeletal:  No cyanosis, clubbing, or edema Skin:  No rashes, lesions, ulcers palpation of skin: no induration or nodules Neurologic:  CN 2-12 intact Sensation all 4 extremities intact Psychiatric:  Mental status Mood, affect appropriate Orientation to person, place, time  judgment and insight appear intact  I have personally reviewed the following:   Today's Data  Vitals  Lab Data  BMP, CBC  Micro Data    Imaging  CT head CTA head MRI Head  Cardiology Data  EKG Echocardiogram  Scheduled Meds:  allopurinol  100 mg Oral QHS   diltiazem  240 mg Oral Daily   donepezil  5 mg Oral QHS   hydroxyurea  500 mg Oral Daily   olopatadine  1 drop Both Eyes BID   pantoprazole  40 mg Oral Daily   rosuvastatin  10 mg Oral Daily   sodium chloride flush  3 mL Intravenous Q12H   Continuous Infusions:  lactated ringers 75 mL/hr at 01/10/21 1030    Principal Problem:   Syncope and collapse Active Problems:   Dyslipidemia   Essential hypertension   ATRIAL FIBRILLATION   Polycythemia vera (HCC)   Subarachnoid hemorrhage  following injury with brief loss of consciousness but without open intracranial wound ([place])   Maxillary sinus fracture, closed, initial encounter ([place])   COPD (chronic obstructive pulmonary disease) ([place])   Thoracic ascending aortic aneurysm   Pulmonary nodules/lesions, multiple   Thyroid nodule greater than or equal to 1 cm in diameter incidentally noted on imaging study   Dementia without behavioral disturbance (HCC)   Cerebral embolism with cerebral infarction   CVA (cerebral vascular accident) ([place])   LOS: 0 days   A & P  * Syncope and collapse- (present on admission) -Etiology is not clear. The  differential diagnosis is broad, including vasovagal syncope, seizure, TIA/stroke, arrhythmia (has known afib), ACS (no CP, negative troponin x 2), hypoglycemia, orthostatic status, carotid artery stenosis -Severity of the injury sustained during syncope does not correlate with the etiology of syncope, but rather is a manifestation of activity around the time of syncope. -Patients at highest risk from syncope include those with serious comorbidities; age >32; exertional > supine syncope -This patient is at moderate/high risk for serious outcom -Cardiac syncope is more likely when associated with syncope during exertion -Orthostatic vital signs not obtainable due to the patient's high fall risk. -Neuro checks q4 -PT/OT eval and treat -Consider loop recorder if syncope recurs   Subarachnoid hemorrhage following injury with brief loss of consciousness but without open intracranial wound ([place]) -Patient d/w Dr. Ellene Route by EDP -This is not an unexpected finding given his head trauma -No further evaluation is needed -Could start [facility] if needed, per ER PA -No AC as per recommendation of neurology -MRI has demonstrated small acute infarcts of the right parietal and frontal lobe. Embolic etiology is suspected. 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   Maxillary sinus fracture, closed, initial encounter ([place])- (present on admission) -I spoke with Dr. Claudia Desanctis -He recommends outpatient referral for Dr. Erin Hearing to see in the next week or so -There is nothing that needs to be done acutely during this hospitalization   Dementia without behavioral disturbance ([place])- (present on admission) -He was oriented, conversant, and very pleasant and appropriate at the time of my evaluation -Continue Aricept, Ativan    Thyroid nodule greater than or equal  to 1 cm in diameter incidentally noted on imaging study- (present on admission) -Recommended for non-emergent outpatient [place] -Daughter is aware and will f/u with PCP to request   Pulmonary nodules/lesions, multiple- (present on admission) -appreciated on CTA -Needs non-contrast CT repeated in 12 months   Thoracic ascending aortic aneurysm- (present on admission) -4 cm -Needs annual imaging by CTA or MRA   COPD (chronic obstructive pulmonary disease) ([place])- (present on admission) -Emphysema noted on CTA -He is not taking medications for this issue currently   Polycythemia vera ([place])- (present on admission) -Previously had prn phlebotomy -Was started on hydroxyurea by Dr. Julien Nordmann -Appears to be stable at this time -Will continue hydroxyurea, defer other treatment to Dr. Julien Nordmann (will add to treatment team)   ATRIAL FIBRILLATION- (present on admission) -Rate controlled with Cardizem -He has been on 81 mg ASA since his prior hemorrhagic stroke in 2012 -Hold ASA and AC for now but could resume soon according to neurosurgery   Essential hypertension- (present on admission) -Continue Cardizem -Hold Cozaar for now, although stage 3a CKD appears to be stable so it may be reasonable to resume this soon   Dyslipidemia- (present  on admission) -Continue Crestor   I have seen and examined this patient myself. I have spent 35 minutes in his evaluation and care.  DVT Prophylaxis: SCD's Advance Care Planning:   Code Status: DNR   Family Communication: Diane Disposition: TBD   Windel Keziah, DO [facility] Direct contact: see [email or web address]  7PM-7AM contact night coverage as above 01/10/2021, 3:23 PM  LOS: 0 days

## 2021-01-10 NOTE — Progress Notes (Signed)
ED hand off nurse refused to check orthostatic vitals after patient has had 2 falls in the hospital. Patient fall documentation not correctly updated for patient safety. Patient documented as a mod fall risk, a level 5 walking and a 0 NIH. Attempted to inquire more information from ED RN and was not given any information. "I'm not sure, I didn't have him then." In response to orthos, "I can't we have 50 in the waiting room. charge wants him up there now. He's on the way."  Purple man was not validated. Before patient was brought up.

## 2021-01-10 NOTE — Progress Notes (Signed)
PT Cancellation Note  Patient Details Name: Dean Pratt MRN: [id number] DOB: Jun 24, 1935   Cancelled Treatment:    Reason Eval/Treat Not Completed: Patient at procedure or test/unavailable; observed pt when admitted to unit and assisted nursing with transfer stretcher to bed, but was getting vitals, etc to planned to return.  Upon return, pt in MRI.  Will attempt again another day.    Reginia Naas 01/10/2021, 4:19 PM Magda Kiel, PT Acute Rehabilitation Services Pager:[phone] Office:[phone] 01/10/2021

## 2021-01-10 NOTE — ED Notes (Signed)
Per Dr. Bridgett Larsson, plan to speak with neurology regarding changes in pt's neurological status.

## 2021-01-10 NOTE — ED Notes (Signed)
Neurologist at pt bedside

## 2021-01-10 NOTE — Progress Notes (Signed)
STROKE TEAM PROGRESS NOTE   SUBJECTIVE (INTERVAL HISTORY) His daughter is at the bedside. He states he feels a lot better than when he arrived. No further dizziness or lightheadedness or presyncope symptoms. He denies any left eye pain, but his neck hurts from the fall. He thinks his speech was a little slurred but is back to his normal baseline. No HA, dysphagia, aphasia, dysarthria, numbness, tingling, weakness of a particular limb, or vision changes.  MRI scan of the brain shows tiny embolic right frontal and parietal infarcts likely from his underlying atrial fibrillation and he is not having anticoagulation due to history of intracerebral hemorrhage and suspected cerebral amyloid angiopathy. OBJECTIVE Vitals:   01/10/21 0330 01/10/21 0630 01/10/21 0859 01/10/21 1200  BP: 130/87 (!) 152/92 130/77 (!) 140/98  Pulse: 74 77 71 72  Resp: 17 17 19 18   Temp:      TempSrc:      SpO2: 95% 96% 96% 92%  Weight:      Height:        CBC:  Recent Labs  Lab 01/09/21 1031 01/10/21 0532  WBC 10.5 10.7*  NEUTROABS 8.8*  --   HGB 16.1 14.9  HCT 50.8 46.4  MCV 88.3 86.6  PLT 337 809    Basic Metabolic Panel:  Recent Labs  Lab 01/09/21 1135 01/09/21 1340 01/10/21 0532  NA 138  --  136  K 3.6  --  4.9  CL 107  --  105  CO2 19*  --  22  GLUCOSE 140*  --  110*  BUN 12  --  10  CREATININE 1.31*  --  1.12  CALCIUM 9.6  --  9.0  MG  --  1.7  --     Lipid Panel:  Recent Labs  Lab 01/10/21 0532  CHOL 104  TRIG 89  HDL 47  CHOLHDL 2.2  VLDL 18  LDLCALC 39   HgbA1c:  Lab Results  Component Value Date   HGBA1C 5.2 01/10/2021   IMAGING  Results for orders placed or performed during the hospital encounter of 01/09/21  MR BRAIN WO CONTRAST   Narrative   CLINICAL DATA:  Syncope, presyncope  EXAM: MRI HEAD WITHOUT CONTRAST  TECHNIQUE: Multiplanar, multiecho pulse sequences of the brain and surrounding structures were obtained without intravenous contrast.  COMPARISON:   04/08/2010 MRI correlation is also made with CT head 01/09/2021  FINDINGS: Brain: Small areas of restricted diffusion with ADC correlate in the right parietal lobe ((series 4, image 31 and medial right frontal lobe (series 4, image 41), consistent with acute infarcts. No definite acute parenchymal hemorrhage. No mass, mass effect, or midline shift.  Hemosiderin deposition is noted in the right occipital lobe, sequela of a prior parenchymal hematoma seen on the 04/08/2010 MRI, with associated encephalomalacia in this region. Additional punctate areas of susceptibility artifact in the bilateral cerebral and cerebellar hemispheres, which are new from the prior MRI.  No hydrocephalus or definite extra-axial collection. T2 hyperintense signal in the periventricular white matter, likely the sequela of chronic small vessel ischemic disease.  Vascular: Patent flow voids.  Skull and upper cervical spine: Normal marrow signal.  Sinuses/Orbits: The orbits are unremarkable. Layering T1 isointense and T2 hyperintense fluid in the right maxillary sinus, which may represent hemorrhage.  Other: None.  IMPRESSION: 1. Small areas of restricted diffusion in the right parietal and frontal lobe, consistent with small acute infarcts. Given multiple vascular territories, an embolic etiology is suspected. 2. Punctate foci of hemosiderin deposition  in the bilateral cerebral and cerebellar hemispheres, which are new compared to 20/12. These are nonspecific but can be seen in the setting of cerebral amyloid angiopathy.   Electronically Signed   By: Merilyn Baba M.D.   On: 01/09/2021 19:29   CT HEAD WO CONTRAST (5MM)   Narrative   CLINICAL DATA:  Fall.  EXAM: CT HEAD WITHOUT CONTRAST  CT MAXILLOFACIAL WITHOUT CONTRAST  CT CERVICAL SPINE WITHOUT CONTRAST  TECHNIQUE: Multidetector CT imaging of the head, cervical spine, and maxillofacial structures were performed using the standard  protocol without intravenous contrast. Multiplanar CT image reconstructions of the cervical spine and maxillofacial structures were also generated.  COMPARISON:  CT head dated July 25, 2010.  FINDINGS: CT HEAD FINDINGS  Brain: Trace right frontal subarachnoid hemorrhage (series 3, images 20-24). No evidence of acute infarction, hydrocephalus, extra-axial collection or mass lesion/mass effect. Progressive mild generalized cerebral atrophy and moderate chronic microvascular ischemic changes. Unchanged right occipital encephalomalacia.  Vascular: Calcified atherosclerosis at the skull base. No hyperdense vessel.  Skull: Negative for fracture or focal lesion.  Other: Small left frontal scalp hematoma.  CT MAXILLOFACIAL FINDINGS  Osseous: Deformity of the right maxillary sinus posterior wall suspicious for nondisplaced fracture. No destructive process.  Orbits: Negative. No traumatic or inflammatory finding.  Sinuses: Layering fluid/hemorrhage in the right maxillary sinus. Remaining paranasal sinuses and mastoid air cells are clear.  Soft tissues: Small left periorbital hematoma.  CT CERVICAL SPINE FINDINGS  Alignment: 3 mm retrolisthesis at C2-C3 with right posterior facet subluxation, likely chronic in the setting of partial C2-C3 spinous process fusion. Trace retrolisthesis at C5-C6 and C6-C7. Trace anterolisthesis at C4-C5 and C7-T1.  Skull base and vertebrae: No acute fracture. No primary bone lesion or focal pathologic process.  Soft tissues and spinal canal: No prevertebral fluid or swelling. No visible canal hematoma.  Disc levels: C3-C4 through C5-C6 and C7-T1 interbody ankylosis. Bilateral facet fusion at C3-C4, C4-C5, and C7-T1. Severe disc height loss at C6-C7.  Upper chest: Mild centrilobular and paraseptal emphysema.  Other: None.  IMPRESSION: 1.Small areas of restricted diffusion in the right parietal and frontal lobe, consistent with small acute  infarcts. Given multiple vascular territories, an embolic etiology is suspected. 2. Punctate foci of hemosiderin deposition in the bilateral cerebral and cerebellar hemispheres, which are new compared to 20/12. These are nonspecific but can be seen in the setting of cerebral amyloid angiopathy.  Critical Value/emergent results were called by telephone at the time of interpretation on 01/09/2021 at 1:17 pm to provider AMJAD ALI, who verbally acknowledged these results.   Electronically Signed   By: Titus Dubin M.D.   On: 01/09/2021 13:28   Results for orders placed or performed during the hospital encounter of 04/08/10  MR Brain W Wo Contrast   Narrative   *RADIOLOGY REPORT*  Clinical Data: Headaches.  Visual difficulty for 3 days.  On Coumadin for atrial fibrillation.  MRI HEAD WITHOUT AND WITH CONTRAST  Technique:  Multiplanar, multiecho pulse sequences of the brain and surrounding structures were obtained according to standard protocol without and with intravenous contrast  Contrast: 15 ml MultiHance.  Comparison: None.  Findings: Right occipital lobe 2.6 x 1.6 x 2.2 cm hematoma with surrounding vasogenic edema.  Local mass effect without displacement of the adjacent ventricle.  This may be related to anticoagulation or possibly congophillic/amyloid angiopathy.  There are vessels surrounding this region which may be reactive rather than representing underlying vascular malformation.  There are no other areas of enhancement  or hemorrhage to suggest intracranial metastatic disease. These possibilities can be addressed on follow- up as this hematoma clears.  Major dural sinuses appear patent without findings to suggest venous thrombosis contributing to the intracranial hemorrhage. Ectatic patent major intracranial arterial structures.  No acute thrombotic infarct.  Moderate small vessel disease type changes.  Global atrophy without hydrocephalus.  Mild cervical  spondylotic changes.  IMPRESSION: Right occipital lobe hematoma may be related to anticoagulation with other considerations as described above.  Critical test results telephoned to Dr. Brigitte Pulse at the time of interpretation on 04/08/2010 at 1:55 p.m.  Original Report Authenticated By: Doug Sou, M.D.   PHYSICAL EXAM:  Gen: Appears well, pleasant elderly Caucasian male resting comfortably in ED bed.  HEENT: Evidence of trauma to left face. Edema and redness to the left eye. No discharge. Laceration repaired to forehead.  CV: RRR.  Resp: No increased WOB or wheezing.  GI: flat, NT.  Extremities: well perfused.  Psych: light.   NEURO:  Mental Status: AA&Ox3  Speech/Language: speech is without dysarthria or aphasia.  Naming, repetition, fluency, and comprehension intact.  Cranial Nerves:  II: PERRL. Visual fields full.  III, IV, VI: EOMI.  V: Sensation is intact to light touch and symmetrical to face.  VII: Smile is symmetrical. VIII: hearing intact to voice. IX, X: Palate elevates symmetrically. Phonation is normal.  IR:[id number] shrug 5/5. XII: tongue is midline without fasciculations. Motor: 5/5 strength to all muscle groups tested.  Tone: is normal and bulk is normal Sensation- Intact to light touch bilaterally. Extinction absent to DSS.   Coordination: FTN intact bilaterally, HKS: no ataxia in BLE. No drift.  DTRs: 2+ brachioradialis. 3+ patella.  Gait- deferred.  NIHSS:  1a Level of Conscious.: 0 1b LOC Questions: 0 1c LOC Commands: 0 2 Best Gaze: 0 3 Visual: 0 4 Facial Palsy: 0 5a Motor Arm - left: 0 5b Motor Arm - Right: 0 6a Motor Leg - Left: 0 6b Motor Leg - Right: 0 7 Limb Ataxia: 0 8 Sensory: 0 9 Best Language: 0 10 Dysarthria: 0 11 Extinct and Inattention.: 0 TOTAL: 0  ASSESSMENT/PLAN Mr. SAMIT SYLVE is a 86 y.o. male with history of BPH, CHF, HTN, HLD, AF not on [facility], ICH 2012 on Warfarin, cerebral amyloid angiopathy on imaging and MHA presenting  with syncopal episode with a fall causing head trauma.   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  Likely from underlying atrial fibrillation not on anticoagulation due to history of intracerebral hemorrhage suspected cerebral amyloid angiopathy CT head Consistent finding with infarct in the right parietal and frontal lobe, consistent with small acute infarcts suspected to be embolic.  MRI head shows CTH results plus non specific areas of cerebral amyloid angiopathy.  CTA head and neck shows trace hemorrhage. No LVO. Mild atherosclerosis. 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  2D Echo shows EF 50-55%. Mild LVH, No atrial level shunt.  LDL 39 under goal of 70.  HgbA1c 5.2 under goal of 7%.  ____SCDs___________________for VTE prophylaxis ASA 81mg  po qd prior to admission, now on ASA 81mg  po Patient counseled to be compliant with his antithrombotic medications Even though AFib is in history, avoid AC due to his history of ICH.  Ongoing aggressive stroke risk factor management Therapy recommendations:  PT/OT.  Disposition: per therapy recommendations.   Hypertension Controlled and stable on admission.  Permissive hypertension (OK if < 220/120) but gradually normalize in 5-7 days Long-term BP  goal normotensive < 130/90.   Hyperlipidemia Home meds: Crestor resumed in hospital LDL 39, goal < 70 Continue statin at discharge  Other Stroke Risk Factors Advanced age Hx stroke/TIA Migraines AFib, unable to take [facility].  Cerebral amyloid angiopathy.   Hospital day # 0  Clance Boll, MSN, APN-BC Neurology Nurse Practitioner Pager [phone]  STROKE MD NOTE :  I have personally obtained history,examined this patient, reviewed notes, independently viewed imaging studies, participated in medical decision making and plan of care.ROS completed by me personally and pertinent positives fully documented  I have made any additions  or clarifications directly to the above note. Agree with note above.  He presented with embolic small right MCA branch infarcts likely from underlying atrial fibrillation and has not been on anticoagulation due to previous history of intracerebral hemorrhage and suspected amyloid angiopathy.  His deficits have resolved.  Recommend increase dose of aspirin from 81 to 325 mg daily.  Check CT angiogram, lipid profile hemoglobin A1c.  Mobilize out of bed.  Therapy consults.  Greater than 50% time during this 50-minute visit was spent in counseling and coordination of care and discussion about stroke evaluation, prevention, treatment and answering questions  Antony Contras, MD Medical Director [place] Pager: [phone] 01/10/2021 3:28 PM  To contact Stroke Continuity provider, please refer to [email or web address] After hours, contact General Neurology

## 2021-01-10 NOTE — ED Notes (Signed)
RN paged floor coverage per pt's change in neurological status. Pt now unable to tell this RN the month, place, and year.

## 2021-01-10 NOTE — ED Notes (Signed)
Pt's left eye is ecchymotic and has 2+ swelling.

## 2021-01-10 NOTE — Plan of Care (Signed)

## 2021-01-10 NOTE — Evaluation (Signed)
Occupational Therapy Evaluation Patient Details Name: Dean Pratt MRN: [id number] DOB: 1935-03-04 Today's Date: 01/10/2021   History of Present Illness Dean Pratt is a 86 y.o. male presenting with syncope. Found to have Lynch along with a possible right maxillary sinus non displaced fracture and MRI Brain small acute stroke in the R parietal and frontal lobe.  PHMx: afib not on AC; BPH; HTN; HLD; and ICH (2012)   Clinical Impression   This 86 yo male admitted with above presents to acute OT with PLOF of being totally independent with basic ADLs and ambulating without an AD. Currently he is min guard A when up on his feet either pushing IV pole or without AD and needs min guard A anytime he is up on his feet for basic ADLs. He was not orthostatic with me, but did report mild dizziness with sitting up and standing up. He will continue to benefit from acute OT with follow up [place].     Q  Recommendations for follow up therapy are one component of a multi-disciplinary discharge planning process, led by the attending physician.  Recommendations may be updated based on patient status, additional functional criteria and insurance authorization.   Follow Up Recommendations  Home health OT    Assistance Recommended at Discharge Frequent or constant Supervision/Assistance  Patient can return home with the following A little help with walking and/or transfers;A little help with bathing/dressing/bathroom    Functional Status Assessment  Patient has had a recent decline in their functional status and demonstrates the ability to make significant improvements in function in a reasonable and predictable amount of time.  Equipment Recommendations  None recommended by OT       Precautions / Restrictions Precautions Precautions: Fall Restrictions Weight Bearing Restrictions: No      Mobility Bed Mobility Overal bed mobility: Needs Assistance Bed Mobility: Supine to Sit;Sit to Supine     Supine  to sit: Supervision Sit to supine: Supervision        Transfers Overall transfer level: Needs assistance   Transfers: Sit to/from Stand Sit to Stand: Min guard           General transfer comment: pushing IV pole initially then without anything      Balance Overall balance assessment: Needs assistance Sitting-balance support: No upper extremity supported;Feet supported Sitting balance-Leahy Scale: Fair     Standing balance support: No upper extremity supported Standing balance-Leahy Scale: Fair                             ADL either performed or assessed with clinical judgement   ADL Overall ADL's : Needs assistance/impaired Eating/Feeding: Independent;Sitting   Grooming: Set up;Sitting   Upper Body Bathing: Set up;Sitting   Lower Body Bathing: Min guard;Sit to/from stand   Upper Body Dressing : Set up;Sitting   Lower Body Dressing: Min guard;Sit to/from stand   Toilet Transfer: Min guard;Ambulation Toilet Transfer Details (indicate cue type and reason): No AD ~100 feet Toileting- Clothing Manipulation and Hygiene: Min guard;Sit to/from stand               Vision Baseline Vision/History: 1 Wears glasses Ability to See in Adequate Light: 0 Adequate Patient Visual Report: No change from baseline              Pertinent Vitals/Pain Pain Assessment: No/denies pain     Hand Dominance Right   Extremity/Trunk Assessment Upper Extremity Assessment Upper Extremity Assessment:  Overall WFL for tasks assessed           Communication Communication Communication: No difficulties   Cognition Arousal/Alertness: Awake/alert Behavior During Therapy: WFL for tasks assessed/performed Overall Cognitive Status: Impaired/Different from baseline                                 General Comments: normally he said he would know the year and month--orginally told me June of 2063--did correct with cues     General Comments  BP did not  show any signs of orthostatics            Home Living Family/patient expects to be discharged to:: Private residence Living Arrangements: Spouse/significant other Available Help at Discharge: Family;Available 24 hours/day Type of Home: Independent living facility Home Access: Level entry     Home Layout: One level     Bathroom Shower/Tub: Walk-in Hydrologist: Handicapped height     Home Equipment: None          Prior Functioning/Environment Prior Level of Function : Independent/Modified Independent               ADLs Comments: does not drive        OT Problem List: Impaired balance (sitting and/or standing)      OT Treatment/Interventions: Self-care/ADL training;DME and/or AE instruction;Patient/family education;Balance training    OT Goals(Current goals can be found in the care plan section) Acute Rehab OT Goals Patient Stated Goal: to go home soon and not fall again OT Goal Formulation: With patient Time For Goal Achievement: 01/24/21 Potential to Achieve Goals: Good  OT Frequency: Min 2X/week       AM-PAC OT "6 Clicks" Daily Activity     Outcome Measure Help from another person eating meals?: None Help from another person taking care of personal grooming?: A Little Help from another person toileting, which includes using toliet, bedpan, or urinal?: A Little Help from another person bathing (including washing, rinsing, drying)?: A Little Help from another person to put on and taking off regular upper body clothing?: A Little Help from another person to put on and taking off regular lower body clothing?: A Little 6 Click Score: 19   End of Session Equipment Utilized During Treatment: Gait belt  Activity Tolerance: Patient tolerated treatment well Patient left: in bed;with call bell/phone within reach  OT Visit Diagnosis: Unsteadiness on feet (R26.81);Other abnormalities of gait and mobility (R26.89)                Time:  1200-1230 OT Time Calculation (min): 30 min Charges:  OT General Charges $OT Visit: 1 Visit OT Evaluation $OT Eval Moderate Complexity: 1 Mod OT Treatments $Self Care/Home Management : 8-22 mins  [address], OTR/L Acute [employer] Pager [phone] Office [phone]     Almon Register 01/10/2021, 1:02 PM

## 2021-01-11 DIAGNOSIS — R55 Syncope and collapse: Secondary | ICD-10-CM | POA: Diagnosis not present

## 2021-01-11 LAB — BASIC METABOLIC PANEL
Anion gap: 7 (ref 5–15)
BUN: 7 mg/dL — ABNORMAL LOW (ref 8–23)
CO2: 25 mmol/L (ref 22–32)
Calcium: 9.3 mg/dL (ref 8.9–10.3)
Chloride: 107 mmol/L (ref 98–111)
Creatinine, Ser: 1.17 mg/dL (ref 0.61–1.24)
GFR, Estimated: >60 mL/min (ref >60)
Glucose, Bld: 131 mg/dL — ABNORMAL HIGH (ref 70–99)
Potassium: 3.8 mmol/L (ref 3.5–5.1)
Sodium: 139 mmol/L (ref 135–145)

## 2021-01-11 LAB — CBC WITH DIFFERENTIAL/PLATELET
Abs Immature Granulocytes: 0.04 10*3/uL (ref 0.00–0.07)
Basophils Absolute: 0 10*3/uL (ref 0.0–0.1)
Basophils Relative: 0 %
Eosinophils Absolute: 0 10*3/uL (ref 0.0–0.5)
Eosinophils Relative: 0 %
HCT: 44.5 % (ref 39.0–52.0)
Hemoglobin: 14.7 g/dL (ref 13.0–17.0)
Immature Granulocytes: 0 %
Lymphocytes Relative: 6 %
Lymphs Abs: 0.6 10*3/uL — ABNORMAL LOW (ref 0.7–4.0)
MCH: 28.3 pg (ref 26.0–34.0)
MCHC: 33 g/dL (ref 30.0–36.0)
MCV: 85.7 fL (ref 80.0–100.0)
Monocytes Absolute: 0.7 10*3/uL (ref 0.1–1.0)
Monocytes Relative: 7 %
Neutro Abs: 8.8 10*3/uL — ABNORMAL HIGH (ref 1.7–7.7)
Neutrophils Relative %: 87 %
Platelets: 320 10*3/uL (ref 150–400)
RBC: 5.19 MIL/uL (ref 4.22–5.81)
RDW: 15.3 % (ref 11.5–15.5)
WBC: 10.2 10*3/uL (ref 4.0–10.5)
nRBC: 0 % (ref 0.0–0.2)

## 2021-01-11 NOTE — Evaluation (Signed)
Physical Therapy Evaluation Patient Details Name: Dean Pratt MRN: [id number] DOB: Aug 03, 1935 Today's Date: 01/11/2021  History of Present Illness  Dean Pratt is a 86 y.o. male presenting with syncope. Found to have [place] along with a possible right maxillary sinus non displaced fracture and MRI brain small acute stroke in the R parietal and frontal lobe.  PHMx: afib not on AC; BPH; HTN; HLD; and ICH (2012)   Clinical Impression  Pt admitted with above diagnosis. PTA pt resided with his wife at [place], independent mobility/ADLs. On eval, pt required supervision bed mobility, min assist sit to stand, and min guard assist amb 110' pushing IV pole. Pt with c/o feeling lightheaded with increased ambulation distances but VSS.  Pt currently with functional limitations due to the deficits listed below (see PT Problem List). Pt will benefit from skilled PT to increase their independence and safety with mobility to allow discharge to the venue listed below.          Recommendations for follow up therapy are one component of a multi-disciplinary discharge planning process, led by the attending physician.  Recommendations may be updated based on patient status, additional functional criteria and insurance authorization.  Follow Up Recommendations Home health PT    Assistance Recommended at Discharge Frequent or constant Supervision/Assistance  Patient can return home with the following       Equipment Recommendations None recommended by PT  Recommendations for Other Services       Functional Status Assessment Patient has had a recent decline in their functional status and demonstrates the ability to make significant improvements in function in a reasonable and predictable amount of time.     Precautions / Restrictions Precautions Precautions: Fall Restrictions Weight Bearing Restrictions: No      Mobility  Bed Mobility Overal bed mobility: Needs Assistance Bed Mobility: Supine  to Sit;Sit to Supine     Supine to sit: Supervision Sit to supine: Supervision   General bed mobility comments: supervision for safety    Transfers Overall transfer level: Needs assistance Equipment used: None Transfers: Sit to/from Stand Sit to Stand: Min assist           General transfer comment: assist to power up, increased time to stabilize balance    Ambulation/Gait Ambulation/Gait assistance: Min guard Gait Distance (Feet): 110 Feet Assistive device: IV Pole Gait Pattern/deviations: Step-through pattern;Decreased stride length Gait velocity: decreased Gait velocity interpretation: <1.31 ft/sec, indicative of household ambulator   General Gait Details: distance limited by pt feeling lightheaded. VSS  Stairs            Wheelchair Mobility    Modified Rankin (Stroke Patients Only) Modified Rankin (Stroke Patients Only) Pre-Morbid Rankin Score: No symptoms Modified Rankin: Moderately severe disability     Balance Overall balance assessment: Needs assistance Sitting-balance support: No upper extremity supported;Feet supported Sitting balance-Leahy Scale: Fair     Standing balance support: Single extremity supported;No upper extremity supported;During functional activity Standing balance-Leahy Scale: Fair                               Pertinent Vitals/Pain Pain Assessment: Faces Faces Pain Scale: Hurts little more Pain Location: posterior neck Pain Descriptors / Indicators: Discomfort;Sore Pain Intervention(s): Monitored during session;Repositioned;Heat applied    Home Living Family/patient expects to be discharged to:: Private residence Living Arrangements: Spouse/significant other Available Help at Discharge: Family;Available 24 hours/day Type of Home: Independent living facility Home Access:  Level entry       Home Layout: One level Home Equipment: Shower seat - built in      Prior Function Prior Level of Function :  Independent/Modified Independent               ADLs Comments: does not drive     Hand Dominance   Dominant Hand: Right    Extremity/Trunk Assessment   Upper Extremity Assessment Upper Extremity Assessment: Defer to OT evaluation    Lower Extremity Assessment Lower Extremity Assessment: Overall WFL for tasks assessed    Cervical / Trunk Assessment Cervical / Trunk Assessment: Kyphotic  Communication   Communication: No difficulties  Cognition Arousal/Alertness: Awake/alert Behavior During Therapy: WFL for tasks assessed/performed Overall Cognitive Status: Impaired/Different from baseline Area of Impairment: Orientation;Memory;Safety/judgement;Awareness;Problem solving                 Orientation Level: Disoriented to;Time   Memory: Decreased short-term memory   Safety/Judgement: Decreased awareness of deficits;Decreased awareness of safety Awareness: Emergent Problem Solving: Difficulty sequencing;Requires verbal cues General Comments: Able to state is was January but stated the year as 1963. Easily frustrated. Scattered thought pattern.        General Comments General comments (skin integrity, edema, etc.): Orthostatic BP: supine 149/102, sit 153/90, initial stance 157/103, stand after 3 minutes 148/103    Exercises     Assessment/Plan    PT Assessment Patient needs continued PT services  PT Problem List Decreased mobility;Decreased safety awareness;Decreased activity tolerance;Pain;Decreased balance;Decreased knowledge of use of DME       PT Treatment Interventions DME instruction;Therapeutic activities;Gait training;Therapeutic exercise;Patient/family education;Balance training;Functional mobility training;Cognitive remediation    PT Goals (Current goals can be found in the Care Plan section)  Acute Rehab PT Goals Patient Stated Goal: home PT Goal Formulation: With patient Time For Goal Achievement: 01/25/21 Potential to Achieve Goals:  Good    Frequency Min 4X/week     Co-evaluation               AM-PAC PT "6 Clicks" Mobility  Outcome Measure Help needed turning from your back to your side while in a flat bed without using bedrails?: None Help needed moving from lying on your back to sitting on the side of a flat bed without using bedrails?: A Little Help needed moving to and from a bed to a chair (including a wheelchair)?: A Little Help needed standing up from a chair using your arms (e.g., wheelchair or bedside chair)?: A Little Help needed to walk in hospital room?: A Little Help needed climbing 3-5 steps with a railing? : A Lot 6 Click Score: 18    End of Session Equipment Utilized During Treatment: Gait belt Activity Tolerance: Patient tolerated treatment well Patient left: in bed;with call bell/phone within reach;with bed alarm set Nurse Communication: Mobility status PT Visit Diagnosis: Unsteadiness on feet (R26.81);Difficulty in walking, not elsewhere classified (R26.2)    Time: 1023-1050 PT Time Calculation (min) (ACUTE ONLY): 27 min   Charges:   PT Evaluation $PT Eval Moderate Complexity: 1 Mod PT Treatments $Gait Training: 8-22 mins        Lorrin Goodell, PT  Office # [phone] Pager [phone]   Lorriane Shire 01/11/2021, 12:01 PM

## 2021-01-11 NOTE — Progress Notes (Signed)
PROGRESS NOTE  Dean Pratt PJK:[id number] DOB: [birth year] DOA: 01/09/2021 PCP: [name], MD  Brief History   Dean Pratt is a 86 y.o. male with medical history significant of afib not on [facility]; BPH; HTN; HLD; and ICH (2012) presenting with syncope.  He got up and thinks he looked out the door.  The next thing he knew he was trying to get up out of the floor.  He vaguely remembers where he was in the house.  He did have a bit of dizziness yesterday.  Today, he ran an errand and felt fine but felt light-headed when he got back.  No chest pain.  No recent illness.  ER Course:  Syncopal episode.  Felt dizzy with walking, LOC, head injury s/p repair.  Head CT with trace SAH, Dr. Ellene Route does not recommend intervention and ok to start [facility] if needed.  D-dimer ordered and elevated so CTA chest ordered.     The patient has been admitted to a telemetry bed. Echocardiogram has been ordered and he will be monitored on telemetry. MRI was ordered due to the patient's severe head trauma. It demonstrated small acute infarcts of the right parietal and frontal lobe. There was also punctate foci of hemosiderin deposition in the bilateral cerebral and cerebellar hemispheres which are new when compared to prior MRI. They are consistent with the patient's known diagnosis of cerebral amyloid angiopathy.   Orthostatic vitals were unable to be completed due to the patient's history of multiple falls.  I have discussed the patient with Dr. Tilden Dome. While he does not recommend anticoagulation due to the history of intracerebral hemorrhage. However, he has started the patient on ASA 325 mg daily starting today. We are continuing permissive hypertension, but will normalize over the next 5-7 days. He will be continued on Crestor as prior to admission.  He has been evaluated by PT/OT. They have recommended Home health PT/OT with return of the patient to his ILF situation.  Consultants  Neurology  Procedures   None  Antibiotics   Anti-infectives (From admission, onward)    None      Subjective  The patient is resting comfortably. No new complaints.  Objective   Vitals:  Vitals:   01/11/21 0745 01/11/21 1205  BP: 134/87 (!) 141/83  Pulse: 80 66  Resp: 18   Temp: 98.4 F (36.9 C) 98.2 F (36.8 C)  SpO2: 96% 97%    Exam:  Constitutional:  The patient is awake, alert, and oriented x 3. No acute distress. Respiratory:  No increased work of breathing. No wheezes, rales, or rhonchi No tactile fremitus Cardiovascular:  Regular rate and rhythm No murmurs, ectopy, or gallups. No lateral PMI. No thrills. Abdomen:  Abdomen is soft, non-tender, non-distended No hernias, masses, or organomegaly Normoactive bowel sounds.  Musculoskeletal:  No cyanosis, clubbing, or edema Skin:  No rashes, lesions, ulcers palpation of skin: no induration or nodules Neurologic:  CN 2-12 intact Sensation all 4 extremities intact Psychiatric:  Mental status Mood, affect appropriate Orientation to person, place, time  judgment and insight appear intact  I have personally reviewed the following:   Today's Data  Vitals  Lab Data  BMP, CBC  Micro Data    Imaging  CT head CTA head MRI Head  Cardiology Data  EKG Echocardiogram  Scheduled Meds:  allopurinol  100 mg Oral QHS   aspirin  325 mg Oral Daily   diltiazem  240 mg Oral Daily   donepezil  5 mg Oral  QHS   hydroxyurea  500 mg Oral Daily   olopatadine  1 drop Both Eyes BID   pantoprazole  40 mg Oral Daily   rosuvastatin  10 mg Oral Daily   sodium chloride flush  3 mL Intravenous Q12H   Continuous Infusions:  lactated ringers 75 mL/hr at 01/11/21 7741    Principal Problem:   Syncope and collapse Active Problems:   Dyslipidemia   Essential hypertension   ATRIAL FIBRILLATION   Polycythemia vera (HCC)   Subarachnoid hemorrhage following injury with brief loss of consciousness but without open intracranial wound  (HCC)   Maxillary sinus fracture, closed, initial encounter ([place])   COPD (chronic obstructive pulmonary disease) (HCC)   Thoracic ascending aortic aneurysm   Pulmonary nodules/lesions, multiple   Thyroid nodule greater than or equal to 1 cm in diameter incidentally noted on imaging study   Dementia without behavioral disturbance (HCC)   Cerebral embolism with cerebral infarction   CVA (cerebral vascular accident) ([place])   LOS: 1 day   A & P  * Syncope and collapse- (present on admission) -Etiology is not clear. The differential diagnosis is broad, including vasovagal syncope, seizure, TIA/stroke, arrhythmia (has known afib), ACS (no CP, negative troponin x 2), hypoglycemia, orthostatic status, carotid artery stenosis -Severity of the injury sustained during syncope does not correlate with the etiology of syncope, but rather is a manifestation of activity around the time of syncope. -Patients at highest risk from syncope include those with serious comorbidities; age >45; exertional > supine syncope -This patient is at moderate/high risk for serious outcom -Cardiac syncope is more likely when associated with syncope during exertion -Orthostatic vital signs not obtainable due to the patient's high fall risk. 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 I have discussed the patient with Dr. Tilden Dome. While he does not recommend anticoagulation due to the history of intracerebral hemorrhage. However, he has started the patient on ASA 325 mg daily starting today. We are continuing permissive hypertension, but will normalize over the next 5-7 days. He will be continued on Crestor as prior to admission.   Subarachnoid hemorrhage following injury with brief loss of  consciousness but without open intracranial wound ([place]) -Patient d/w Dr. Ellene Route by EDP -This is not an unexpected finding given his head trauma -No further evaluation is needed -Could start [facility] if needed, per ER PA -No AC as per recommendation of neurology -MRI has demonstrated small acute infarcts of the right parietal and frontal lobe. Embolic etiology is suspected. 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   Maxillary sinus fracture, closed, initial encounter ([place])- (present on admission) -I spoke with Dr. Claudia Desanctis -He recommends outpatient referral for Dr. Erin Hearing to see in the next week or so -There is nothing that needs to be done acutely during this hospitalization   Dementia without behavioral disturbance ([place])- (present on admission) -He was oriented, conversant, and very pleasant and appropriate at the time of my evaluation -Continue Aricept, Ativan    Thyroid nodule greater than or equal to 1 cm in diameter incidentally noted on imaging study- (present on admission) -Recommended for non-emergent outpatient [place] -Daughter is aware and will f/u  with PCP to request   Pulmonary nodules/lesions, multiple- (present on admission) -appreciated on CTA -Needs non-contrast CT repeated in 12 months   Thoracic ascending aortic aneurysm- (present on admission) -4 cm -Needs annual imaging by CTA or MRA   COPD (chronic obstructive pulmonary disease) ([place])- (present on admission) -Emphysema noted on CTA -He is not taking medications for this issue currently   Polycythemia vera ([place])- (present on admission) -Previously had prn phlebotomy -Was started on hydroxyurea by Dr. Julien Nordmann -Appears to be stable at this time -Will continue hydroxyurea, defer other treatment to Dr. Julien Nordmann (will add to treatment team)   ATRIAL  FIBRILLATION- (present on admission) -Rate controlled with Cardizem -He has been on 81 mg ASA since his prior hemorrhagic stroke in 2012 -Hold AC  due to history of cerebral hemorrhage. Dr. Tilden Dome has initiated ASA 325 daily today.   Essential hypertension- (present on admission) -Continue Cardizem -Hold Cozaar for now, although stage 3a CKD appears to be stable so it may be reasonable to resume this soon. Continue permissive hypertension and normalize over the next 5-7 days.   Dyslipidemia- (present on admission) -Continue Crestor   I have seen and examined this patient myself. I have spent 35 minutes in his evaluation and care.  DVT Prophylaxis: SCD's Advance Care Planning:   Code Status: DNR   Family Communication: Dean Pratt Disposition: TBD   Dean Schuenemann, DO [facility] Direct contact: see [email or web address]  7PM-7AM contact night coverage as above 01/11/2021, 5:39 PM  LOS: 0 days

## 2021-01-11 NOTE — Progress Notes (Signed)
Occupational Therapy Treatment Patient Details Name: Dean Pratt MRN: [id number] DOB: 1935/03/13 Today's Date: 01/11/2021   History of present illness Dean Pratt is a 86 y.o. male presenting with syncope. Found to have [place] along with a possible right maxillary sinus non displaced fracture and MRI brain small acute stroke in the R parietal and frontal lobe.  PHMx: afib not on AC; BPH; HTN; HLD; and ICH (2012)   OT comments  Pt making progress with OT goals. This session, pt completed multiple ADL's all with min guard to min A. Due to cognitive deficits, pt requiring min verbal cuing throughout for safety and sequencing. Pt had 2 episodes of dizziness, however BP remained stable. Pt's family requesting that pt receive SNF level therapies prior to pt returning home, due to pt's wife being unable to assist pt at all physically and pt continuing to be a mod-high fall risk. OT will continue to follow acutely.    Recommendations for follow up therapy are one component of a multi-disciplinary discharge planning process, led by the attending physician.  Recommendations may be updated based on patient status, additional functional criteria and insurance authorization.    Follow Up Recommendations  Skilled nursing-short term rehab (<3 hours/day)    Assistance Recommended at Discharge Frequent or constant Supervision/Assistance  Patient can return home with the following  A little help with walking and/or transfers;A little help with bathing/dressing/bathroom   Equipment Recommendations  None recommended by OT    Recommendations for Other Services      Precautions / Restrictions Precautions Precautions: Fall Precaution Comments: Dizziness, not always correlated with BP Restrictions Weight Bearing Restrictions: No       Mobility Bed Mobility Overal bed mobility: Modified Independent             General bed mobility comments: No assist needed this session    Transfers Overall  transfer level: Needs assistance Equipment used: Rolling walker (2 wheels) Transfers: Sit to/from Stand Sit to Stand: Min guard           General transfer comment: Min g for safety     Balance Overall balance assessment: Needs assistance Sitting-balance support: No upper extremity supported;Feet supported Sitting balance-Leahy Scale: Fair     Standing balance support: Single extremity supported;During functional activity Standing balance-Leahy Scale: Fair                             ADL either performed or assessed with clinical judgement   ADL Overall ADL's : Needs assistance/impaired     Grooming: Minimal assistance;Standing Grooming Details (indicate cue type and reason): Min guard physically, Min A due to verbal cuing for safety, sequencing, and finding items             Lower Body Dressing: Min guard;Sitting/lateral leans;Sit to/from stand Lower Body Dressing Details (indicate cue type and reason): donned socks and underwear EOB no difficulties, min g for safety Toilet Transfer: Min guard;Ambulation Toilet Transfer Details (indicate cue type and reason): Min guard with balance and initial complaint of dizziness Toileting- Clothing Manipulation and Hygiene: Min guard;Sit to/from stand Toileting - Clothing Manipulation Details (indicate cue type and reason): completed from toilet no physical assist needed     Functional mobility during ADLs: Min guard;Rolling walker (2 wheels) General ADL Comments: Overall min guard to min A due to verbal cues needed for safetyand sequencing.    Extremity/Trunk Assessment  Vision   Vision Assessment?: Vision impaired- to be further tested in functional context Additional Comments: Pt had difficulty finding grooming items on sink and placing tooth paste on tooth brush (placed correctly, could not tell that he did not squeeze any paste out)   Perception     Praxis      Cognition Arousal/Alertness:  Awake/alert Behavior During Therapy: WFL for tasks assessed/performed Overall Cognitive Status: Impaired/Different from baseline Area of Impairment: Memory;Safety/judgement;Awareness;Problem solving                     Memory: Decreased short-term memory   Safety/Judgement: Decreased awareness of deficits;Decreased awareness of safety Awareness: Emergent Problem Solving: Difficulty sequencing;Requires verbal cues General Comments: Pt easily distracted, getsoff topic, not aware of safety, per family some memory concerns          Exercises     Shoulder Instructions       General Comments BP did not show orthostatics, pt complained of dizziness, after sitting for 2 mins and again after returned to bed and scooting towards middle.    Pertinent Vitals/ Pain       Pain Assessment: 0-10 Pain Score: 4  Pain Location: posterior neck Pain Descriptors / Indicators: Discomfort;Sore Pain Intervention(s): Monitored during session;Repositioned  Home Living                                          Prior Functioning/Environment              Frequency  Min 2X/week        Progress Toward Goals  OT Goals(current goals can now be found in the care plan section)  Progress towards OT goals: Progressing toward goals  Acute Rehab OT Goals Patient Stated Goal: To go home OT Goal Formulation: With patient/family Time For Goal Achievement: 01/24/21 Potential to Achieve Goals: Good ADL Goals Pt Will Perform Upper Body Bathing: Independently;standing Pt Will Perform Lower Body Bathing: Independently;sit to/from stand Pt Will Perform Upper Body Dressing: Independently;sitting;standing Pt Will Perform Lower Body Dressing: Independently;sit to/from stand Pt Will Transfer to Toilet: Independently;ambulating;regular height toilet;grab bars Pt Will Perform Toileting - Clothing Manipulation and hygiene: Independently;sit to/from stand Pt Will Perform Tub/Shower  Transfer: Shower transfer;Independently;ambulating;shower seat  Plan Frequency remains appropriate;Discharge plan needs to be updated    Co-evaluation                 AM-PAC OT "6 Clicks" Daily Activity     Outcome Measure   Help from another person eating meals?: None Help from another person taking care of personal grooming?: A Little Help from another person toileting, which includes using toliet, bedpan, or urinal?: A Little Help from another person bathing (including washing, rinsing, drying)?: A Little Help from another person to put on and taking off regular upper body clothing?: A Little Help from another person to put on and taking off regular lower body clothing?: A Little 6 Click Score: 19    End of Session Equipment Utilized During Treatment: Gait belt;Rolling walker (2 wheels)  OT Visit Diagnosis: Unsteadiness on feet (R26.81);Other abnormalities of gait and mobility (R26.89)   Activity Tolerance Patient tolerated treatment well   Patient Left in bed;with call bell/phone within reach;with bed alarm set;with family/visitor present   Nurse Communication Mobility status        Time: 5974-1638 OT Time Calculation (min): 57 min  Charges:  OT General Charges $OT Visit: 1 Visit OT Treatments $Self Care/Home Management : 23-37 mins $Therapeutic Activity: 23-37 mins  Jayren Cease H., OTR/L Acute Rehabilitation  Hertha Gergen Elane Caylie Sandquist 01/11/2021, 7:02 PM

## 2021-01-12 DIAGNOSIS — R5381 Other malaise: Secondary | ICD-10-CM

## 2021-01-12 DIAGNOSIS — R55 Syncope and collapse: Secondary | ICD-10-CM | POA: Diagnosis not present

## 2021-01-12 NOTE — Progress Notes (Signed)
PROGRESS NOTE  Dean Pratt ZYS:[id number] DOB: 10-11-1935 DOA: 01/09/2021 PCP: [name], MD  Brief History   Dean Pratt is a 86 y.o. male with medical history significant of afib not on [facility]; BPH; HTN; HLD; and ICH (2012) presenting with syncope.  He got up and thinks he looked out the door.  The next thing he knew he was trying to get up out of the floor.  He vaguely remembers where he was in the house.  He did have a bit of dizziness yesterday.  Today, he ran an errand and felt fine but felt light-headed when he got back.  No chest pain.  No recent illness.  ER Course:  Syncopal episode.  Felt dizzy with walking, LOC, head injury s/p repair.  Head CT with trace SAH, Dr. Ellene Route does not recommend intervention and ok to start [facility] if needed.  D-dimer ordered and elevated so CTA chest ordered.     The patient has been admitted to a telemetry bed. Echocardiogram has been ordered and he will be monitored on telemetry. MRI was ordered due to the patient's severe head trauma. It demonstrated small acute infarcts of the right parietal and frontal lobe. There was also punctate foci of hemosiderin deposition in the bilateral cerebral and cerebellar hemispheres which are new when compared to prior MRI. They are consistent with the patient's known diagnosis of cerebral amyloid angiopathy.   Orthostatic vitals were unable to be completed due to the patient's history of multiple falls.  I have discussed the patient with Dr. Tilden Dome. While he does not recommend anticoagulation due to the history of intracerebral hemorrhage. However, he has started the patient on ASA 325 mg daily starting today. We are continuing permissive hypertension, but will normalize over the next 5-7 days. He will be continued on Crestor as prior to admission.  He has been evaluated by PT/OT. They have recommended Home health PT/OT with return of the patient to his ILF situation.  Consultants  Neurology  Procedures   None  Antibiotics   Anti-infectives (From admission, onward)    None      Subjective  The patient is resting comfortably. No new complaints.  Objective   Vitals:  Vitals:   01/12/21 1121 01/12/21 1541  BP: 110/77 104/70  Pulse: 70 98  Resp: 18 18  Temp: 98.3 F (36.8 C) 98.3 F (36.8 C)  SpO2: 97% 97%    Exam:  Constitutional:  The patient is awake, alert, and oriented x 3. No acute distress. Respiratory:  No increased work of breathing. No wheezes, rales, or rhonchi No tactile fremitus Cardiovascular:  Regular rate and rhythm No murmurs, ectopy, or gallups. No lateral PMI. No thrills. Abdomen:  Abdomen is soft, non-tender, non-distended No hernias, masses, or organomegaly Normoactive bowel sounds.  Musculoskeletal:  No cyanosis, clubbing, or edema Skin:  No rashes, lesions, ulcers palpation of skin: no induration or nodules Neurologic:  CN 2-12 intact Sensation all 4 extremities intact Psychiatric:  Mental status Mood, affect appropriate Orientation to person, place, time  judgment and insight appear intact  I have personally reviewed the following:   Today's Data  Vitals  Lab Data  BMP, CBC  Micro Data    Imaging  CT head CTA head MRI Head  Cardiology Data  EKG Echocardiogram  Scheduled Meds:  allopurinol  100 mg Oral QHS   aspirin  325 mg Oral Daily   diltiazem  240 mg Oral Daily   donepezil  5 mg Oral QHS  hydroxyurea  500 mg Oral Daily   olopatadine  1 drop Both Eyes BID   pantoprazole  40 mg Oral Daily   rosuvastatin  10 mg Oral Daily   sodium chloride flush  3 mL Intravenous Q12H    Principal Problem:   Syncope and collapse Active Problems:   Dyslipidemia   Essential hypertension   ATRIAL FIBRILLATION   Polycythemia vera (HCC)   Subarachnoid hemorrhage following injury with brief loss of consciousness but without open intracranial wound (HCC)   Maxillary sinus fracture, closed, initial encounter ([place])   COPD  (chronic obstructive pulmonary disease) (HCC)   Thoracic ascending aortic aneurysm   Pulmonary nodules/lesions, multiple   Thyroid nodule greater than or equal to 1 cm in diameter incidentally noted on imaging study   Dementia without behavioral disturbance (HCC)   Cerebral embolism with cerebral infarction   CVA (cerebral vascular accident) ([place])   LOS: 2 days   A & P  * Syncope and collapse- (present on admission) -Etiology is not clear. The differential diagnosis is broad, including vasovagal syncope, seizure, TIA/stroke, arrhythmia (has known afib), ACS (no CP, negative troponin x 2), hypoglycemia, orthostatic status, carotid artery stenosis -Severity of the injury sustained during syncope does not correlate with the etiology of syncope, but rather is a manifestation of activity around the time of syncope. -Patients at highest risk from syncope include those with serious comorbidities; age >39; exertional > supine syncope -This patient is at moderate/high risk for serious outcom -Cardiac syncope is more likely when associated with syncope during exertion -Orthostatic vital signs not obtainable due to the patient's high fall risk. 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 I have discussed the patient with Dr. Tilden Dome. While he does not recommend anticoagulation due to the history of intracerebral hemorrhage. However, he has started the patient on ASA 325 mg daily starting today. We are continuing permissive hypertension, but will normalize over the next 5-7 days. He will be continued on Crestor as prior to admission.   Subarachnoid hemorrhage following injury with brief loss of consciousness but without open intracranial wound ([place]) -Patient d/w Dr. Ellene Route  by EDP -This is not an unexpected finding given his head trauma -No further evaluation is needed -Could start [facility] if needed, per ER PA -No AC as per recommendation of neurology -MRI has demonstrated small acute infarcts of the right parietal and frontal lobe. Embolic etiology is suspected. 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   Maxillary sinus fracture, closed, initial encounter ([place])- (present on admission) -I spoke with Dr. Claudia Desanctis -He recommends outpatient referral for Dr. Erin Hearing to see in the next week or so -There is nothing that needs to be done acutely during this hospitalization   Dementia without behavioral disturbance ([place])- (present on admission) -He was oriented, conversant, and very pleasant and appropriate at the time of my evaluation -Continue Aricept, Ativan    Thyroid nodule greater than or equal to 1 cm in diameter incidentally noted on imaging study- (present on admission) -Recommended for non-emergent outpatient [place] -Daughter is aware and will f/u with PCP to request   Pulmonary nodules/lesions, multiple- (present on admission) -appreciated on CTA -  Needs non-contrast CT repeated in 12 months   Thoracic ascending aortic aneurysm- (present on admission) -4 cm -Needs annual imaging by CTA or MRA   COPD (chronic obstructive pulmonary disease) ([place])- (present on admission) -Emphysema noted on CTA -He is not taking medications for this issue currently   Polycythemia vera ([place])- (present on admission) -Previously had prn phlebotomy -Was started on hydroxyurea by Dr. Julien Nordmann -Appears to be stable at this time -Will continue hydroxyurea, defer other treatment to Dr. Julien Nordmann (will add to treatment team)   ATRIAL FIBRILLATION- (present on admission) -Rate controlled with Cardizem -He has been on 81 mg  ASA since his prior hemorrhagic stroke in 2012 -Hold AC  due to history of cerebral hemorrhage. Dr. Tilden Dome has initiated ASA 325 daily today.   Essential hypertension- (present on admission) -Continue Cardizem -Hold Cozaar for now, although stage 3a CKD appears to be stable so it may be reasonable to resume this soon. Continue permissive hypertension and normalize over the next 5-7 days.   Dyslipidemia- (present on admission) -Continue Crestor  Debility: PT has recommended SNF.   I have seen and examined this patient myself. I have spent 32 minutes in his evaluation and care.  DVT Prophylaxis: SCD's Advance Care Planning:   Code Status: DNR   Family Communication: None available Disposition: SNF  Jamie Belger, DO [facility] Direct contact: see [email or web address]  7PM-7AM contact night coverage as above 01/12/2021, 5:25 PM  LOS: 0 days

## 2021-01-12 NOTE — Progress Notes (Signed)
Physical Therapy Treatment Patient Details Name: Dean AHART MRN: [id number] DOB: 10-03-1935 Today's Date: 01/12/2021   History of Present Illness Dean Pratt is a 86 y.o. male presenting with syncope. Found to have [place] along with a possible right maxillary sinus non displaced fracture and MRI brain small acute stroke in the R parietal and frontal lobe.  PHMx: afib not on AC; BPH; HTN; HLD; and ICH (2012)    PT Comments    Pt required supervision bed mobility, min guard assist transfers, min guard assist ambulation in room without AD, and min guard assist ambulation 150' with RW.  Pt continues to report dizziness. No LOB noted. VSS. Rollator is probably most appropriate, if AD will be needed at d/c. Will trial next session. Pt in recliner with feet elevated at end of session. Family unable to provide physical assist at home. D/C rec, therefore, updated to SNF.   Recommendations for follow up therapy are one component of a multi-disciplinary discharge planning process, led by the attending physician.  Recommendations may be updated based on patient status, additional functional criteria and insurance authorization.  Follow Up Recommendations  Skilled nursing-short term rehab (<3 hours/day)     Assistance Recommended at Discharge Frequent or constant Supervision/Assistance  Patient can return home with the following A little help with walking and/or transfers;Assistance with cooking/housework;Assist for transportation;A little help with bathing/dressing/bathroom   Equipment Recommendations  Other (comment) (may benefit from rollator)    Recommendations for Other Services       Precautions / Restrictions Precautions Precautions: Fall Precaution Comments: Dizziness, not always correlated with BP     Mobility  Bed Mobility Overal bed mobility: Needs Assistance Bed Mobility: Supine to Sit     Supine to sit: Supervision     General bed mobility comments: supervision for  safety    Transfers Overall transfer level: Needs assistance Equipment used: Rolling walker (2 wheels) Transfers: Sit to/from Stand Sit to Stand: Min guard           General transfer comment: increased time to power up, cues for sequencing and safety    Ambulation/Gait Ambulation/Gait assistance: Min guard Gait Distance (Feet): 150 Feet Assistive device: Rolling walker (2 wheels) Gait Pattern/deviations: Step-through pattern;Decreased stride length Gait velocity: decreased Gait velocity interpretation: <1.31 ft/sec, indicative of household ambulator   General Gait Details: gait trial with RW. Pt reports feeling more stable but not does like how hard it is to steer/turn. May prefer rollator. Ambulated short distances without AD min/min guard assist.   Stairs             Wheelchair Mobility    Modified Rankin (Stroke Patients Only) Modified Rankin (Stroke Patients Only) Pre-Morbid Rankin Score: No symptoms Modified Rankin: Moderately severe disability     Balance Overall balance assessment: Needs assistance Sitting-balance support: No upper extremity supported;Feet supported Sitting balance-Leahy Scale: Good     Standing balance support: No upper extremity supported;During functional activity;Bilateral upper extremity supported Standing balance-Leahy Scale: Fair                              Cognition Arousal/Alertness: Awake/alert Behavior During Therapy: WFL for tasks assessed/performed Overall Cognitive Status: Impaired/Different from baseline Area of Impairment: Memory;Safety/judgement;Awareness;Problem solving                     Memory: Decreased short-term memory   Safety/Judgement: Decreased awareness of deficits;Decreased awareness of safety Awareness: Emergent Problem  Solving: Difficulty sequencing;Requires verbal cues          Exercises      General Comments        Pertinent Vitals/Pain Pain Assessment:  Faces Faces Pain Scale: Hurts little more Pain Location: posterior neck Pain Descriptors / Indicators: Discomfort Pain Intervention(s): Monitored during session;Repositioned    Home Living                          Prior Function            PT Goals (current goals can now be found in the care plan section) Acute Rehab PT Goals Patient Stated Goal: home Progress towards PT goals: Progressing toward goals    Frequency    Min 4X/week      PT Plan Discharge plan needs to be updated    Co-evaluation              AM-PAC PT "6 Clicks" Mobility   Outcome Measure  Help needed turning from your back to your side while in a flat bed without using bedrails?: A Little Help needed moving from lying on your back to sitting on the side of a flat bed without using bedrails?: A Little Help needed moving to and from a bed to a chair (including a wheelchair)?: A Little Help needed standing up from a chair using your arms (e.g., wheelchair or bedside chair)?: A Little Help needed to walk in hospital room?: A Little Help needed climbing 3-5 steps with a railing? : A Lot 6 Click Score: 17    End of Session Equipment Utilized During Treatment: Gait belt Activity Tolerance: Patient tolerated treatment well Patient left: in chair;with call bell/phone within reach Nurse Communication: Mobility status PT Visit Diagnosis: Unsteadiness on feet (R26.81);Difficulty in walking, not elsewhere classified (R26.2)     Time: 4696-2952 PT Time Calculation (min) (ACUTE ONLY): 25 min  Charges:  $Gait Training: 23-37 mins                     Dean Pratt, [place]  Office # [phone] Pager [phone]    Dean Pratt 01/12/2021, 12:03 PM

## 2021-01-13 DIAGNOSIS — R55 Syncope and collapse: Secondary | ICD-10-CM | POA: Diagnosis not present

## 2021-01-13 LAB — BASIC METABOLIC PANEL
Anion gap: 7 (ref 5–15)
BUN: 12 mg/dL (ref 8–23)
CO2: 27 mmol/L (ref 22–32)
Calcium: 9.2 mg/dL (ref 8.9–10.3)
Chloride: 102 mmol/L (ref 98–111)
Creatinine, Ser: 1.24 mg/dL (ref 0.61–1.24)
GFR, Estimated: 58 mL/min — ABNORMAL LOW (ref >60)
Glucose, Bld: 125 mg/dL — ABNORMAL HIGH (ref 70–99)
Potassium: 4 mmol/L (ref 3.5–5.1)
Sodium: 136 mmol/L (ref 135–145)

## 2021-01-13 LAB — CBC WITH DIFFERENTIAL/PLATELET
Abs Immature Granulocytes: 0.05 10*3/uL (ref 0.00–0.07)
Basophils Absolute: 0.1 10*3/uL (ref 0.0–0.1)
Basophils Relative: 1 %
Eosinophils Absolute: 0.2 10*3/uL (ref 0.0–0.5)
Eosinophils Relative: 2 %
HCT: 43.1 % (ref 39.0–52.0)
Hemoglobin: 13.9 g/dL (ref 13.0–17.0)
Immature Granulocytes: 1 %
Lymphocytes Relative: 12 %
Lymphs Abs: 1.1 10*3/uL (ref 0.7–4.0)
MCH: 28.1 pg (ref 26.0–34.0)
MCHC: 32.3 g/dL (ref 30.0–36.0)
MCV: 87.1 fL (ref 80.0–100.0)
Monocytes Absolute: 0.7 10*3/uL (ref 0.1–1.0)
Monocytes Relative: 8 %
Neutro Abs: 6.7 10*3/uL (ref 1.7–7.7)
Neutrophils Relative %: 76 %
Platelets: 329 10*3/uL (ref 150–400)
RBC: 4.95 MIL/uL (ref 4.22–5.81)
RDW: 15.4 % (ref 11.5–15.5)
WBC: 8.8 10*3/uL (ref 4.0–10.5)
nRBC: 0 % (ref 0.0–0.2)

## 2021-01-13 NOTE — Care Management Important Message (Signed)
Important Message  Patient Details  Name: KINTE TRIM MRN: [id number] Date of Birth: [birth year]   Medicare Important Message Given:  Yes     Blair Lundeen 01/13/2021, 2:25 PM

## 2021-01-13 NOTE — Progress Notes (Signed)
Pt's daughter Lenna Sciara called and was updated from a nursing perspective. Sent and placed daughter's phone number for MD.

## 2021-01-13 NOTE — Progress Notes (Addendum)
PROGRESS NOTE  Dean Pratt TFT:[id number] DOB: Dec 11, 1935 DOA: 01/09/2021 PCP: [name], MD  Brief History   Dean Pratt is a 86 y.o. male with medical history significant of afib not on [facility]; BPH; HTN; HLD; and ICH (2012) presenting with syncope.  He got up and thinks he looked out the door.  The next thing he knew he was trying to get up out of the floor.  He vaguely remembers where he was in the house.  He did have a bit of dizziness yesterday.  Today, he ran an errand and felt fine but felt light-headed when he got back.  No chest pain.  No recent illness.  ER Course:  Syncopal episode.  Felt dizzy with walking, LOC, head injury s/p repair.  Head CT with trace SAH, Dr. Ellene Route does not recommend intervention and ok to start [facility] if needed.  D-dimer ordered and elevated so CTA chest ordered.     The patient has been admitted to a telemetry bed. Echocardiogram has been ordered and he will be monitored on telemetry. MRI was ordered due to the patient's severe head trauma. It demonstrated small acute infarcts of the right parietal and frontal lobe. There was also punctate foci of hemosiderin deposition in the bilateral cerebral and cerebellar hemispheres which are new when compared to prior MRI. They are consistent with the patient's known diagnosis of cerebral amyloid angiopathy.   Orthostatic vitals were unable to be completed due to the patient's history of multiple falls.  I have discussed the patient with Dr. Tilden Dome. While he does not recommend anticoagulation due to the history of intracerebral hemorrhage. However, he has started the patient on ASA 325 mg daily starting today. We are continuing permissive hypertension, but will normalize over the next 5-7 days. He will be continued on Crestor as prior to admission.  He has been evaluated by PT/OT. They have recommended Home health PT/OT with return of the patient to his ILF situation.  Consultants  Neurology  Procedures   None  Antibiotics   Anti-infectives (From admission, onward)    None      Subjective  The patient is resting comfortably. No new complaints.  Objective   Vitals:  Vitals:   01/13/21 1300 01/13/21 1618  BP: 115/90 132/84  Pulse: 68 74  Resp: 19 19  Temp: 98.2 F (36.8 C) (!) 97.3 F (36.3 C)  SpO2: 98% 96%    Exam:  Constitutional:  The patient is awake, alert, and oriented x 3. No acute distress. Respiratory:  No increased work of breathing. No wheezes, rales, or rhonchi No tactile fremitus Cardiovascular:  Regular rate and rhythm No murmurs, ectopy, or gallups. No lateral PMI. No thrills. Abdomen:  Abdomen is soft, non-tender, non-distended No hernias, masses, or organomegaly Normoactive bowel sounds.  Musculoskeletal:  No cyanosis, clubbing, or edema Skin:  No rashes, lesions, ulcers palpation of skin: no induration or nodules Neurologic:  CN 2-12 intact Sensation all 4 extremities intact Psychiatric:  Mental status Mood, affect appropriate Orientation to person, place, time  judgment and insight appear intact  I have personally reviewed the following:   Today's Data  Vitals  Lab Data  BMP, CBC  Micro Data    Imaging  CT head CTA head MRI Head  Cardiology Data  EKG Echocardiogram  Scheduled Meds:  allopurinol  100 mg Oral QHS   aspirin  325 mg Oral Daily   diltiazem  240 mg Oral Daily   donepezil  5 mg Oral  QHS   hydroxyurea  500 mg Oral Daily   olopatadine  1 drop Both Eyes BID   pantoprazole  40 mg Oral Daily   rosuvastatin  10 mg Oral Daily   sodium chloride flush  3 mL Intravenous Q12H    Principal Problem:   Syncope and collapse Active Problems:   Dyslipidemia   Essential hypertension   ATRIAL FIBRILLATION   Polycythemia vera (HCC)   Subarachnoid hemorrhage following injury with brief loss of consciousness but without open intracranial wound (HCC)   Maxillary sinus fracture, closed, initial encounter ([place])    COPD (chronic obstructive pulmonary disease) (HCC)   Thoracic ascending aortic aneurysm   Pulmonary nodules/lesions, multiple   Thyroid nodule greater than or equal to 1 cm in diameter incidentally noted on imaging study   Dementia without behavioral disturbance (HCC)   Cerebral embolism with cerebral infarction   CVA (cerebral vascular accident) ([place])   Debility   LOS: 3 days   A & P  * Syncope and collapse- (present on admission) -Etiology is not clear. The differential diagnosis is broad, including vasovagal syncope, seizure, TIA/stroke, arrhythmia (has known afib), ACS (no CP, negative troponin x 2), hypoglycemia, orthostatic status, carotid artery stenosis -Severity of the injury sustained during syncope does not correlate with the etiology of syncope, but rather is a manifestation of activity around the time of syncope. -Patients at highest risk from syncope include those with serious comorbidities; age >85; exertional > supine syncope -This patient is at moderate/high risk for serious outcom -Cardiac syncope is more likely when associated with syncope during exertion -Orthostatic vital signs not obtainable due to the patient's high fall risk. 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 I have discussed the patient with Dr. Tilden Dome. While he does not recommend anticoagulation due to the history of intracerebral hemorrhage. However, he has started the patient on ASA 325 mg daily starting today. We are continuing permissive hypertension, but will normalize over the next 5-7 days. He will be continued on Crestor as prior to admission.   Subarachnoid hemorrhage following injury with brief loss of consciousness but without open intracranial wound ([place]) -Patient  d/w Dr. Ellene Route by EDP -This is not an unexpected finding given his head trauma -No further evaluation is needed -Could start [facility] if needed, per ER PA -No AC as per recommendation of neurology -MRI has demonstrated small acute infarcts of the right parietal and frontal lobe. Embolic etiology is suspected. 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   Maxillary sinus fracture, closed, initial encounter ([place])- (present on admission) -I spoke with Dr. Claudia Desanctis -He recommends outpatient referral for Dr. Erin Hearing to see in the next week or so -There is nothing that needs to be done acutely during this hospitalization   Dementia without behavioral disturbance ([place])- (present on admission) -He was oriented, conversant, and very pleasant and appropriate at the time of my evaluation -Continue Aricept, Ativan    Thyroid nodule greater than or equal to 1 cm in diameter incidentally noted on imaging study- (present on admission) -Recommended for non-emergent outpatient [place] -Daughter is aware and will f/u with PCP to request   Pulmonary nodules/lesions, multiple- (  present on admission) -appreciated on CTA -Needs non-contrast CT repeated in 12 months   Thoracic ascending aortic aneurysm- (present on admission) -4 cm -Needs annual imaging by CTA or MRA   COPD (chronic obstructive pulmonary disease) ([place])- (present on admission) -Emphysema noted on CTA -He is not taking medications for this issue currently   Polycythemia vera ([place])- (present on admission) -Previously had prn phlebotomy -Was started on hydroxyurea by Dr. Julien Nordmann -Appears to be stable at this time -Will continue hydroxyurea, defer other treatment to Dr. Julien Nordmann (will add to treatment team)   ATRIAL FIBRILLATION- (present on admission) -Rate controlled with Cardizem -He has  been on 81 mg ASA since his prior hemorrhagic stroke in 2012 -Hold AC  due to history of cerebral hemorrhage. Dr. Tilden Dome has initiated ASA 325 daily today.   Essential hypertension- (present on admission) -Continue Cardizem -Hold Cozaar for now, although stage 3a CKD appears to be stable so it may be reasonable to resume this soon. Continue permissive hypertension and normalize over the next 5-7 days.   Dyslipidemia- (present on admission) -Continue Crestor  Debility: PT has recommended SNF.   I have seen and examined this patient myself. I have spent 34 minutes in his evaluation and care.  DVT Prophylaxis: SCD's Advance Care Planning:   Code Status: DNR   Family Communication: I discussed the patient in detail with his daughter, Lenna Sciara. All questions answered to the best of my ability. Disposition: SNF  Jaryan Chicoine, DO [facility] Direct contact: see [email or web address]  7PM-7AM contact night coverage as above 01/13/2021, 5:52 PM  LOS: 0 days

## 2021-01-13 NOTE — TOC Initial Note (Addendum)
Transition of Care [facility]) - Initial/Assessment Note    Patient Details  Name: Dean Pratt MRN: [id number] Date of Birth: March 16, 1935  Transition of Care [facility]) CM/SW Contact:    Geralynn Ochs, LCSW Phone Number: 01/13/2021, 2:39 PM  Clinical Narrative:        CSW met with patient to discuss recommendation for SNF placement. Patient in agreement, but asked CSW to contact his daughter, Lenna Sciara, to discuss. CSW explained to patient that his insurance is out of network to go to [facility] for SNF, will have to look elsewhere, and patient indicated understanding. CSW contacted daughter Lenna Sciara via phone to also discuss SNF placement. CSW explained that patient's insurance is out of network for [facility], patient will have to go elsewhere. Daughter asked about changing insurance, but that will not go into effect until the first of the following month. CSW discussed with daughter in-network options, and daughter asked for time to review options available. CSW faxed out referral, will follow.       UPDATE 4:06 PM: CSW received message back from [facility] that she would be interested in [facility] as her first choice. CSW attempted to contact [facility] to ask them to review referral, left a voicemail. Awaiting response.       Expected Discharge Plan: Skilled Nursing Facility Barriers to Discharge: Continued Medical Work up, Ship broker   Patient Goals and CMS Choice Patient states their goals for this hospitalization and ongoing recovery are:: to get rehab CMS Medicare.gov Compare Post Acute Care list provided to:: Patient Choice offered to / list presented to : Patient, Adult Children  Expected Discharge Plan and Services Expected Discharge Plan: [place] Choice: [place] Living arrangements for the past 2 months: [place]                                      Prior Living  Arrangements/Services Living arrangements for the past 2 months: [place] Lives with:: Spouse Patient language and need for interpreter reviewed:: No Do you feel safe going back to the place where you live?: Yes      Need for Family Participation in Patient Care: No (Comment) Care giver support system in place?: No (comment)   Criminal Activity/Legal Involvement Pertinent to Current Situation/Hospitalization: No - Comment as needed  Activities of Daily Living      Permission Sought/Granted Permission sought to share information with : Facility Sport and exercise psychologist, Family Supports Permission granted to share information with : Yes, Verbal Permission Granted  Share Information with NAME: Melissa  Permission granted to share info w AGENCY: SNF  Permission granted to share info w Relationship: Daughter     Emotional Assessment Appearance:: Appears stated age Attitude/Demeanor/Rapport: Engaged Affect (typically observed): Appropriate Orientation: : Oriented to Self, Oriented to Place, Oriented to  Time, Oriented to Situation Alcohol / Substance Use: Not Applicable Psych Involvement: No (comment)  Admission diagnosis:  Syncope and collapse [R55] CVA (cerebral vascular accident) ([place]) [I63.9] Injury of head, initial encounter [S09.90XA] Laceration of forehead, initial encounter [S01.81XA] Maxillary sinus fracture, closed, initial encounter ([place]) [S02.401A] Syncope, unspecified syncope type [R55] Atrial fibrillation, unspecified type [facility]) [I48.91] Patient Active Problem List   Diagnosis Date Noted   Debility 01/12/2021   Cerebral embolism with cerebral infarction 01/10/2021   CVA (cerebral vascular accident) ([place]) 01/10/2021   Syncope and collapse 01/09/2021  Subarachnoid hemorrhage following injury with brief loss of consciousness but without open intracranial wound ([place]) 01/09/2021   Maxillary sinus fracture, closed, initial encounter ([place]) 01/09/2021    COPD (chronic obstructive pulmonary disease) ([place]) 01/09/2021   Thoracic ascending aortic aneurysm 01/09/2021   Pulmonary nodules/lesions, multiple 01/09/2021   Thyroid nodule greater than or equal to 1 cm in diameter incidentally noted on imaging study 01/09/2021   Dementia without behavioral disturbance ([place]) 01/09/2021   Paraspinal soft tissue mass 12/15/2020   Scoliosis of thoracolumbar spine 07/25/2019   Spondylosis without myelopathy or radiculopathy, lumbar region 07/25/2019   Status post ablation of atrial fibrillation 12/24/2017   Polycythemia vera ([place]) 04/13/2017   OTHER AND UNSPECIFIED COAGULATION DEFECTS 01/02/2010   ATRIAL FIBRILLATION 01/02/2010   SNORING 02/27/2009   ISCHEMIC COLITIS 03/27/2008   ABDOMINAL PAIN-RLQ 03/22/2008   ABNORMAL FINDINGS GI TRACT 03/22/2008   PERSONAL HX COLONIC POLYPS 03/22/2008   COLONIC POLYPS 03/21/2008   Dyslipidemia 03/21/2008   GOUT 03/21/2008   ANXIETY 03/21/2008   MIGRAINE HEADACHE 03/21/2008   CONSTIPATION, CHRONIC 03/21/2008   ROSACEA 03/21/2008   Essential hypertension 03/21/2008   CARCINOMA, BASAL CELL, HX OF 03/21/2008   TUBERCULOSIS, HX OF 03/21/2008   ATRIAL FIBRILLATION, HX OF 03/21/2008   BPH (benign prostatic hyperplasia) 03/21/2008   PCP:  [name], MD Pharmacy:   [id number] [id number]: [phone]: [address]Phone: [phone]: [phone]    Social Determinants of Health (SDOH) Interventions    Readmission Risk Interventions No flowsheet data found.

## 2021-01-13 NOTE — NC FL2 (Signed)
[place] LEVEL OF CARE SCREENING TOOL     IDENTIFICATION  Patient Name: Dean Pratt Birthdate: [birth year] Sex: male Admission Date (Current Location): 01/09/2021  [facility] and [place] Number:  Herbalist and Address:  [facility] [place], [place] [address] [place], [place] [address]      Provider Number: [id number]  Attending Physician Name and Address:  Karie Kirks, DO  Relative Name and Phone Number:       Current Level of Care: Hospital Recommended Level of Care: [place] Prior Approval Number:    Date Approved/Denied:   PASRR Number: [id number]  Discharge Plan: SNF    Current Diagnoses: Patient Active Problem List   Diagnosis Date Noted   Debility 01/12/2021   Cerebral embolism with cerebral infarction 01/10/2021   CVA (cerebral vascular accident) ([place]) 01/10/2021   Syncope and collapse 01/09/2021   Subarachnoid hemorrhage following injury with brief loss of consciousness but without open intracranial wound ([place]) 01/09/2021   Maxillary sinus fracture, closed, initial encounter ([place]) 01/09/2021   COPD (chronic obstructive pulmonary disease) ([place]) 01/09/2021   Thoracic ascending aortic aneurysm 01/09/2021   Pulmonary nodules/lesions, multiple 01/09/2021   Thyroid nodule greater than or equal to 1 cm in diameter incidentally noted on imaging study 01/09/2021   Dementia without behavioral disturbance ([place]) 01/09/2021   Paraspinal soft tissue mass 12/15/2020   Scoliosis of thoracolumbar spine 07/25/2019   Spondylosis without myelopathy or radiculopathy, lumbar region 07/25/2019   Status post ablation of atrial fibrillation 12/24/2017   Polycythemia vera ([place]) 04/13/2017   OTHER AND UNSPECIFIED COAGULATION DEFECTS 01/02/2010   ATRIAL FIBRILLATION 01/02/2010   SNORING 02/27/2009   ISCHEMIC COLITIS 03/27/2008   ABDOMINAL PAIN-RLQ 03/22/2008   ABNORMAL FINDINGS GI TRACT 03/22/2008   PERSONAL HX COLONIC POLYPS  03/22/2008   COLONIC POLYPS 03/21/2008   Dyslipidemia 03/21/2008   GOUT 03/21/2008   ANXIETY 03/21/2008   MIGRAINE HEADACHE 03/21/2008   CONSTIPATION, CHRONIC 03/21/2008   ROSACEA 03/21/2008   Essential hypertension 03/21/2008   CARCINOMA, BASAL CELL, HX OF 03/21/2008   TUBERCULOSIS, HX OF 03/21/2008   ATRIAL FIBRILLATION, HX OF 03/21/2008   BPH (benign prostatic hyperplasia) 03/21/2008    Orientation RESPIRATION BLADDER Height & Weight     Self, Time, Situation, Place  Normal Continent Weight: 164 lb 10.9 oz (74.7 kg) Height:  5\' 10"  (177.8 cm)  BEHAVIORAL SYMPTOMS/MOOD NEUROLOGICAL BOWEL NUTRITION STATUS      Continent Diet (heart healthy)  AMBULATORY STATUS COMMUNICATION OF NEEDS Skin   Limited Assist Verbally Surgical wounds (back, liquid skin adhesive)                       Personal Care Assistance Level of Assistance  Bathing, Feeding, Dressing Bathing Assistance: Limited assistance Feeding assistance: Independent Dressing Assistance: Limited assistance     Functional Limitations Info             SPECIAL CARE FACTORS FREQUENCY  PT (By licensed PT), OT (By licensed OT)     PT Frequency: 5x/wk OT Frequency: 5x/wk            Contractures Contractures Info: Not present    Additional Factors Info  Code Status, Allergies, Psychotropic Code Status Info: DNR Allergies Info: Ace Inhibitors, Warfarin And Related Psychotropic Info: Aricept 5mg  daily at bed         Current Medications (01/13/2021):  This is the current hospital active medication list Current Facility-Administered Medications  Medication Dose Route  Frequency Provider Last Rate Last Admin   acetaminophen (TYLENOL) tablet 650 mg  650 mg Oral Q6H PRN Karmen Bongo, MD   650 mg at 01/12/21 1026   Or   acetaminophen (TYLENOL) suppository 650 mg  650 mg Rectal Q6H PRN Karmen Bongo, MD       allopurinol (ZYLOPRIM) tablet 100 mg  100 mg Oral Ivery Quale, MD   100 mg at 01/12/21 2109    aspirin tablet 325 mg  325 mg Oral Daily Garvin Fila, MD   325 mg at 01/13/21 1049   cycloSPORINE (RESTASIS) 0.05 % ophthalmic emulsion 1 drop  1 drop Both Eyes BID PRN Karmen Bongo, MD       diltiazem (CARDIZEM CD) 24 hr capsule 240 mg  240 mg Oral Daily Karmen Bongo, MD   240 mg at 01/13/21 1049   donepezil (ARICEPT) tablet 5 mg  5 mg Oral Ivery Quale, MD   5 mg at 01/12/21 2109   hydroxyurea (HYDREA) capsule 500 mg  500 mg Oral Daily Karmen Bongo, MD   500 mg at 01/13/21 1050   LORazepam (ATIVAN) tablet 0.5-1 mg  0.5-1 mg Oral BID PRN Karmen Bongo, MD       methocarbamol (ROBAXIN) tablet 500 mg  500 mg Oral Q8H PRN Karmen Bongo, MD   500 mg at 01/11/21 1810   morphine 2 MG/ML injection 2 mg  2 mg Intravenous Q2H PRN Karmen Bongo, MD   2 mg at 01/13/21 0232   olopatadine (PATANOL) 0.1 % ophthalmic solution 1 drop  1 drop Both Eyes BID Karmen Bongo, MD   1 drop at 01/13/21 1049   ondansetron (ZOFRAN) tablet 4 mg  4 mg Oral Q6H PRN Karmen Bongo, MD       Or   ondansetron [facility]) injection 4 mg  4 mg Intravenous Q6H PRN Karmen Bongo, MD   4 mg at 01/10/21 1520   oxyCODONE (Oxy IR/ROXICODONE) immediate release tablet 5 mg  5 mg Oral Q4H PRN Karmen Bongo, MD   5 mg at 01/12/21 2108   pantoprazole (PROTONIX) EC tablet 40 mg  40 mg Oral Daily Karmen Bongo, MD   40 mg at 01/13/21 1049   rosuvastatin (CRESTOR) tablet 10 mg  10 mg Oral Daily Karmen Bongo, MD   10 mg at 01/13/21 1049   sodium chloride flush (NS) 0.9 % injection 3 mL  3 mL Intravenous Lillia Mountain, MD   3 mL at 01/13/21 1050     Discharge Medications: Please see discharge summary for a list of discharge medications.  Relevant Imaging Results:  Relevant Lab Results:   Additional Information SS#: [id number]  Geralynn Ochs, LCSW

## 2021-01-14 DIAGNOSIS — R2681 Unsteadiness on feet: Secondary | ICD-10-CM | POA: Diagnosis not present

## 2021-01-14 DIAGNOSIS — I639 Cerebral infarction, unspecified: Secondary | ICD-10-CM

## 2021-01-14 DIAGNOSIS — M6281 Muscle weakness (generalized): Secondary | ICD-10-CM | POA: Diagnosis not present

## 2021-01-14 DIAGNOSIS — I69315 Cognitive social or emotional deficit following cerebral infarction: Secondary | ICD-10-CM | POA: Diagnosis not present

## 2021-01-14 DIAGNOSIS — I7121 Aneurysm of the ascending aorta, without rupture: Secondary | ICD-10-CM | POA: Diagnosis not present

## 2021-01-14 DIAGNOSIS — S066X1D Traumatic subarachnoid hemorrhage with loss of consciousness of 30 minutes or less, subsequent encounter: Secondary | ICD-10-CM | POA: Diagnosis not present

## 2021-01-14 DIAGNOSIS — I6340 Cerebral infarction due to embolism of unspecified cerebral artery: Secondary | ICD-10-CM | POA: Diagnosis not present

## 2021-01-14 DIAGNOSIS — R5381 Other malaise: Secondary | ICD-10-CM | POA: Diagnosis not present

## 2021-01-14 DIAGNOSIS — D45 Polycythemia vera: Secondary | ICD-10-CM | POA: Diagnosis not present

## 2021-01-14 DIAGNOSIS — I10 Essential (primary) hypertension: Secondary | ICD-10-CM | POA: Diagnosis not present

## 2021-01-14 DIAGNOSIS — R55 Syncope and collapse: Secondary | ICD-10-CM | POA: Diagnosis not present

## 2021-01-14 DIAGNOSIS — R279 Unspecified lack of coordination: Secondary | ICD-10-CM | POA: Diagnosis not present

## 2021-01-14 DIAGNOSIS — R262 Difficulty in walking, not elsewhere classified: Secondary | ICD-10-CM | POA: Diagnosis not present

## 2021-01-14 DIAGNOSIS — Z7401 Bed confinement status: Secondary | ICD-10-CM | POA: Diagnosis not present

## 2021-01-14 DIAGNOSIS — R410 Disorientation, unspecified: Secondary | ICD-10-CM | POA: Diagnosis not present

## 2021-01-14 LAB — RESP PANEL BY RT-PCR (FLU A&B, COVID) ARPGX2
Influenza A by PCR: NEGATIVE
Influenza B by PCR: NEGATIVE
SARS Coronavirus 2 by RT PCR: NEGATIVE

## 2021-01-14 MED ORDER — COVID-19MRNA BIVAL VACC PFIZER 30 MCG/0.3ML IM SUSP
0.3000 mL | Freq: Once | INTRAMUSCULAR | Status: AC
  Administered 2021-01-14: 0.3 mL via INTRAMUSCULAR
  Filled 2021-01-14: qty 0.3

## 2021-01-14 MED ORDER — ASPIRIN 325 MG PO TABS: 325.0000 mg | ORAL_TABLET | Freq: Every day | ORAL | Status: DC

## 2021-01-14 MED ORDER — LORAZEPAM 1 MG PO TABS: 0.5000 mg | ORAL_TABLET | Freq: Two times a day (BID) | ORAL | 0 refills | Status: DC | PRN

## 2021-01-14 NOTE — Progress Notes (Signed)
Report called to Margreta Journey, LPN of [facility] for patient transferring to Room 211-1.  All questions answered and awaiting transport of patient by [facility] at 5pm

## 2021-01-14 NOTE — Discharge Summary (Signed)
[facility]  Physician Discharge Summary   Patient ID: Dean Pratt MRN: [id number] DOB/AGE: [birth year] 86 y.o.  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 Erin Hearing Will send ambulatory referral to neurology for CVA   Home Health:to SNF  Equipment/Devices:None   CODE STATUS:DNR   DISCHARGE CONDITION: fair  Diet recommendation: Heart Healthy  INITIAL HISTORY: Dean Pratt is a 86 y.o. male with medical history significant of afib not on AC; BPH; HTN; HLD; and ICH (2012) presenting with syncope.  He got up and thinks he looked out the door.  The next thing he knew he was trying to get up out of the floor.  In the emergency department he underwent CT head which showed trace subarachnoid hemorrhage.  MRI brain showed small acute infarcts of the right parietal and frontal lobe.  Patient was seen by neurology. Started on aspirin.  Seen by PT and OT.  Plan is for him to go to skilled nursing facility for short-term rehab.  Consultants: Neurology   Procedures: Transthoracic echocardiogram    HOSPITAL COURSE:   Syncope and collapse- (present on admission) -Etiology is not clear. The differential diagnosis is broad, including vasovagal syncope, seizure, TIA/stroke, arrhythmia (has known afib),  ACS (no CP, negative troponin x 2), hypoglycemia, orthostatic status, carotid artery stenosis No further episodes of syncope here in the hospital.     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  Patient was seen by neurology.  Started on aspirin 325 mg daily.  Also noted to be on statin.   HbA1c 5.2.  LDL 39. Seen by PT and OT.  SNF is recommended.   Subarachnoid hemorrhage following injury with brief loss of consciousness but without open intracranial wound ([place]) -Patient d/w Dr. Ellene Route by EDP -This is not an unexpected finding given his head trauma -No further evaluation is needed   Maxillary sinus fracture, closed, initial encounter [facility])- (present on admission) Admitting provider spoke with Dr. Claudia Desanctis. -He recommends outpatient referral for Dr. Erin Hearing to see in the next week or so -There is nothing that needs to be done acutely during this hospitalization Forehead laceration was sutured. To be removed on 01/15/2021   Dementia without behavioral disturbance ([place])- (present on admission) Stable -Continue Aricept, Ativan    Thyroid nodule greater than or equal to 1 cm in diameter incidentally noted on imaging study- (present on admission) -Recommended for non-emergent outpatient [place] -Daughter is aware and will f/u with PCP to request   Pulmonary nodules/lesions, multiple- (present on admission) -appreciated on CTA -Needs non-contrast CT repeated in 12 months   Thoracic ascending aortic aneurysm- (present on admission) -4 cm -Needs annual imaging by CTA or MRA   COPD (chronic obstructive pulmonary disease) ([place])- (present on admission) -Emphysema noted on CTA -He is not taking medications for this issue currently Respiratory  status is stable.   Polycythemia vera ([place])- (present on admission) -Previously had prn phlebotomy -Was  started on hydroxyurea by Dr. Julien Nordmann Continue hydroxyurea.  Outpatient follow-up with Dr. Julien Nordmann.   ATRIAL FIBRILLATION- (present on admission) -Rate controlled with Cardizem -He has been on 81 mg ASA since his prior hemorrhagic stroke in 2012 -Holding anticoagulation due to history of cerebral hemorrhage.  Currently on aspirin 325 mg daily.   Essential hypertension- (present on admission) Cardizem was initially held due to stroke and need for permissive hypertension.  Now resumed.  Blood pressure reasonably well controlled.   Was also on Cozaar which remains on hold.  Renal function is stable.  Could be resumed depending on blood pressure trends.   Dyslipidemia- (present on admission) -[place] for discharge to SNF.   PERTINENT LABS:  The results of significant diagnostics from this hospitalization (including imaging, microbiology, ancillary and laboratory) are listed below for reference.    Microbiology: Recent Results (from the past 240 hour(s))  Resp Panel by RT-PCR (Flu A&B, Covid) Nasopharyngeal Swab     Status: None   Collection Time: 01/10/21  9:04 AM   Specimen: Nasopharyngeal Swab; Nasopharyngeal(NP) swabs in vial transport medium  Result Value Ref Range Status   SARS Coronavirus 2 by RT PCR NEGATIVE NEGATIVE Final    Comment: (NOTE) SARS-CoV-2 target nucleic acids are NOT DETECTED.  The SARS-CoV-2 RNA is generally detectable in upper respiratory specimens during the acute phase of infection. The lowest concentration of SARS-CoV-2 viral copies this assay can detect is 138 copies/mL. A negative result does not preclude SARS-Cov-2 infection and should not be used as the sole basis for treatment or other patient management decisions. A negative result may occur with  improper specimen collection/handling, submission of specimen other than nasopharyngeal swab, presence of viral mutation(s) within the areas targeted by this assay, and inadequate number of  viral copies(<138 copies/mL). A negative result must be combined with clinical observations, patient history, and epidemiological information. The expected result is Negative.  Fact Sheet for Patients:  EntrepreneurPulse.com.au  Fact Sheet for Healthcare Providers:  IncredibleEmployment.be  This test is no t yet approved or cleared by the [place] FDA and  has been authorized for detection and/or diagnosis of SARS-CoV-2 by FDA under an Emergency Use Authorization (EUA). This EUA will remain  in effect (meaning this test can be used) for the duration of the COVID-19 declaration under Section 564(b)(1) of the Act, 21 U.S.C.section 360bbb-3(b)(1), unless the authorization is terminated  or revoked sooner.       Influenza A by PCR NEGATIVE NEGATIVE Final   Influenza B by PCR NEGATIVE NEGATIVE Final    Comment: (NOTE) The Xpert Xpress SARS-CoV-2/FLU/RSV plus assay is intended as an aid in the diagnosis of influenza from Nasopharyngeal swab specimens and should not be used as a sole basis for treatment. Nasal washings and aspirates are unacceptable for Xpert Xpress SARS-CoV-2/FLU/RSV testing.  Fact Sheet for Patients: EntrepreneurPulse.com.au  Fact Sheet for Healthcare Providers: IncredibleEmployment.be  This test is not yet approved or cleared by the [place] FDA and has been authorized for detection and/or diagnosis of SARS-CoV-2 by FDA under an Emergency Use Authorization (EUA). This EUA will remain in effect (meaning this test can be used) for the duration of the COVID-19 declaration under Section 564(b)(1) of the Act, 21 U.S.C. section 360bbb-3(b)(1), unless the authorization is terminated or revoked.  Performed at [facility], [place] [address] [place], [place] [address]  Labs:  COVID-19 Labs   Lab Results  Component Value Date   [place] NEGATIVE 01/10/2021       Basic Metabolic Panel: Recent Labs  Lab 01/09/21 1135 01/09/21 1340 01/10/21 0532 01/11/21 0241 01/13/21 0241  NA 138  --  136 139 136  K 3.6  --  4.9 3.8 4.0  CL 107  --  105 107 102  CO2 19*  --  22 25 27   GLUCOSE 140*  --  110* 131* 125*  BUN 12  --  10 7* 12  CREATININE 1.31*  --  1.12 1.17 1.24  CALCIUM 9.6  --  9.0 9.3 9.2  MG  --  1.7  --   --   --     CBC: Recent Labs  Lab 01/09/21 1031 01/10/21 0532 01/11/21 0241 01/13/21 0241  WBC 10.5 10.7* 10.2 8.8  NEUTROABS 8.8*  --  8.8* 6.7  HGB 16.1 14.9 14.7 13.9  HCT 50.8 46.4 44.5 43.1  MCV 88.3 86.6 85.7 87.1  PLT 337 311 320 329     IMAGING STUDIES CT ANGIO HEAD NECK W WO CM  Result Date: 01/10/2021 CLINICAL DATA:  86 year old male with neurologic deficit. Several small acute infarcts on brain MRI yesterday. Recent fall, left maxillary sinus fracture. EXAM: CT ANGIOGRAPHY HEAD AND NECK TECHNIQUE: Multidetector CT imaging of the head and neck was performed using the standard protocol during bolus administration of intravenous contrast. Multiplanar CT image reconstructions and MIPs were obtained to evaluate the vascular anatomy. Carotid stenosis measurements (when applicable) are obtained utilizing NASCET criteria, using the distal internal carotid diameter as the denominator. CONTRAST:  65mL OMNIPAQUE IOHEXOL 350 MG/ML SOLN COMPARISON:  Brain MRI 01/09/2021, head CT 01/09/2021 and earlier. CTA chest 01/09/2021. FINDINGS: CT HEAD Brain: Subtle hyperdensity now in the anterior superior frontal lobe where a small area of restricted diffusion was noted yesterday (series 5, image 25), possibly some subarachnoid blood there (coronal image 29). No intraventricular hemorrhage. No other intracranial hemorrhage identified. No midline shift or mass effect. Encephalomalacia right PCA territory, Patchy and confluent additional white matter hypodensity is stable. No acute cortically based infarct identified. Calvarium and skull  base: Calvarium appears stable and intact. Possible nondisplaced right maxillary sinus fracture as before. Paranasal sinuses: Stable fluid level which appears to be layering hemorrhage in the right maxillary sinus. Orbits: Left periorbital and scalp hematoma. Globes and intraorbital soft tissues appear to remain normal. CTA NECK Skeleton: Visualized skull base is intact. No atlanto-occipital dissociation. Degenerative appearing cervical spine ankylosis C3 through C5. Retrolisthesis of C2 on C3 with no acute fracture identified. No prevertebral or spinal canal hematoma identified. Largely absent dentition. Grossly intact visible upper thoracic osseous structures. Upper chest: Stable visible upper lungs, including no superior mediastinal lymphadenopathy. Paraseptal and centrilobular emphysema. Other neck: Negative. Aortic arch: Tortuous aortic arch with 3 vessel arch configuration. Mild to moderate arch atherosclerosis. Right carotid system: Negative brachiocephalic artery and right CCA origin. Minimal right CCA calcified plaque without stenosis. Minimal right ICA origin and bulb plaque without stenosis. Tortuous right ICA distal to the bulb. Left carotid system: Mildly tortuous left CCA without plaque or stenosis. Minimal calcified plaque at the left ICA origin and bulb without stenosis. Vertebral arteries: Calcified plaque at the right subclavian artery origin with 60 % stenosis with respect to the distal vessel. Minimal calcified plaque at the right vertebral artery origin without stenosis. Patent right vertebral to the skull base without stenosis. Mild calcified plaque in the proximal left subclavian  artery and at the left vertebral artery origin without stenosis. Tortuous left V1 segment. Patent left vertebral artery to the skull base without stenosis. CTA HEAD Posterior circulation: Bilateral V4 segment calcified plaque and vessel irregularity, but only mild distal left vertebral artery stenosis results.  Tortuous vertebrobasilar junction. Patent left PICA and dominant appearing right AICA origins. Tortuous basilar artery without stenosis. Normal SCA and PCA origins. Posterior communicating arteries are diminutive or absent. Bilateral PCA branches are within normal limits. Anterior circulation: Both ICA siphons are patent. Only mild calcified plaque on the left with no significant left siphon stenosis. Normal left ophthalmic artery origin. Similar minimal to mild calcified plaque on the right with no right siphon stenosis. Patent carotid termini. Patent MCA and ACA origins. Normal anterior communicating artery. Left ACA A1 segment is dominant. Bilateral ACA branches are within normal limits. Left MCA M1 segment is tortuous without stenosis. Patent left MCA trifurcation and left MCA branches are within normal limits. Right MCA M1 segment is tortuous without stenosis. Patent right MCA bifurcation and right MCA branches are within normal limits. Venous sinuses: Early contrast timing, not well evaluated. Anatomic variants: None. Review of the MIP images confirms the above findings IMPRESSION: 1. Trace hemorrhage (parenchymal and/or subarachnoid) at the anterior superior right frontal gyrus corresponding to site of small diffusion abnormality on MRI yesterday. Therefore, a posttraumatic cerebral contusion may be more likely than a small infarct. No new intracranial abnormality. 2. Negative for large vessel occlusion. Tortuous arteries in the head and neck with mild for age atherosclerosis and no hemodynamically significant. 3. However, there is 60% atherosclerotic stenosis of the Right subclavian artery origin. 4. Left scalp hematoma. No calvarium fracture. Possible nondisplaced right maxillary sinus fracture with stable hemorrhage within the sinus. 5. Aortic Atherosclerosis (ICD10-I70.0) and Emphysema (ICD10-J43.9). Electronically Signed   By: Genevie Ann M.D.   On: 01/10/2021 10:15   DG Ribs Unilateral W/Chest  Right  Result Date: 01/09/2021 CLINICAL DATA:  Right rib pain after fall. EXAM: RIGHT RIBS AND CHEST - 3+ VIEW COMPARISON:  December 30, 2017. FINDINGS: No fracture or other bone lesions are seen involving the ribs. There is no evidence of pneumothorax or pleural effusion. Both lungs are clear. Heart size and mediastinal contours are within normal limits. IMPRESSION: Negative. Electronically Signed   By: Marijo Conception M.D.   On: 01/09/2021 11:31   DG Pelvis 1-2 Views  Result Date: 01/09/2021 CLINICAL DATA:  Fall. EXAM: PELVIS - 1-2 VIEW COMPARISON:  None. FINDINGS: There is no evidence of pelvic fracture or diastasis. No pelvic bone lesions are seen. IMPRESSION: Negative. Electronically Signed   By: Marijo Conception M.D.   On: 01/09/2021 11:29   CT HEAD WO CONTRAST (5MM)  Result Date: 01/09/2021 CLINICAL DATA:  Fall. EXAM: CT HEAD WITHOUT CONTRAST CT MAXILLOFACIAL WITHOUT CONTRAST CT CERVICAL SPINE WITHOUT CONTRAST TECHNIQUE: Multidetector CT imaging of the head, cervical spine, and maxillofacial structures were performed using the standard protocol without intravenous contrast. Multiplanar CT image reconstructions of the cervical spine and maxillofacial structures were also generated. COMPARISON:  CT head dated July 25, 2010. FINDINGS: CT HEAD FINDINGS Brain: Trace right frontal subarachnoid hemorrhage (series 3, images 20-24). No evidence of acute infarction, hydrocephalus, extra-axial collection or mass lesion/mass effect. Progressive mild generalized cerebral atrophy and moderate chronic microvascular ischemic changes. Unchanged right occipital encephalomalacia. Vascular: Calcified atherosclerosis at the skull base. No hyperdense vessel. Skull: Negative for fracture or focal lesion. Other: Small left frontal scalp hematoma. CT MAXILLOFACIAL FINDINGS  Osseous: Deformity of the right maxillary sinus posterior wall suspicious for nondisplaced fracture. No destructive process. Orbits: Negative. No traumatic  or inflammatory finding. Sinuses: Layering fluid/hemorrhage in the right maxillary sinus. Remaining paranasal sinuses and mastoid air cells are clear. Soft tissues: Small left periorbital hematoma. CT CERVICAL SPINE FINDINGS Alignment: 3 mm retrolisthesis at C2-C3 with right posterior facet subluxation, likely chronic in the setting of partial C2-C3 spinous process fusion. Trace retrolisthesis at C5-C6 and C6-C7. Trace anterolisthesis at C4-C5 and C7-T1. Skull base and vertebrae: No acute fracture. No primary bone lesion or focal pathologic process. Soft tissues and spinal canal: No prevertebral fluid or swelling. No visible canal hematoma. Disc levels: C3-C4 through C5-C6 and C7-T1 interbody ankylosis. Bilateral facet fusion at C3-C4, C4-C5, and C7-T1. Severe disc height loss at C6-C7. Upper chest: Mild centrilobular and paraseptal emphysema. Other: None. IMPRESSION: 1. Trace right frontal subarachnoid hemorrhage. 2. Small left frontal scalp and periorbital hematoma. 3. Deformity of the right maxillary sinus posterior wall suspicious for nondisplaced fracture. Layering fluid/hemorrhage in the right maxillary sinus. 4. No acute cervical spine fracture or traumatic listhesis. Multilevel cervical spondylosis. 5. Emphysema (ICD10-J43.9). Critical Value/emergent results were called by telephone at the time of interpretation on 01/09/2021 at 1:17 pm to provider AMJAD ALI, who verbally acknowledged these results. Electronically Signed   By: Titus Dubin M.D.   On: 01/09/2021 13:28   CT Angio Chest PE W and/or Wo Contrast  Result Date: 01/09/2021 CLINICAL DATA:  Pulmonary embolism (PE) suspected, positive D-dimer EXAM: CT ANGIOGRAPHY CHEST WITH CONTRAST TECHNIQUE: Multidetector CT imaging of the chest was performed using the standard protocol during bolus administration of intravenous contrast. Multiplanar CT image reconstructions and MIPs were obtained to evaluate the vascular anatomy. CONTRAST:  157mL OMNIPAQUE  IOHEXOL 350 MG/ML SOLN COMPARISON:  Chest XR, concurrent. CT chest, 10/09/2019. CT AP, 02/28/2008 FINDINGS: Cardiovascular: Satisfactory opacification of the pulmonary arteries to the segmental level. No evidence of pulmonary embolism. 4.0 cm ascending thoracic aorta. Cardiomegaly.  No pericardial effusion. Mediastinum/Nodes: No enlarged mediastinal, hilar, or axillary lymph nodes. 1.1 cm RIGHT thyroid nodule. The trachea, and esophagus demonstrate no significant findings. Lungs/Pleura: Increased anteroposterior thoracic diameter. Trace bibasilar atelectasis. Biapical centrilobular emphysematous lung change. RIGHT pulmonary cysts. Multiple sub-6 mm RIGHT pulmonary nodules. No pleural effusion or pneumothorax. Upper Abdomen: 1.1 cm LEFT hepatic lobe cysts. Additional subcentimeter RIGHT hepatic lobe lesion is incompletely imaged, though likely a small cyst. Incompletely imaged RIGHT superior pole hypodense renal lesion measuring at least 3.1 cm. Splenic artery calcifications. Musculoskeletal: No chest wall abnormality. No acute or significant osseous findings. Review of the MIP images confirms the above findings. IMPRESSION: 1. No segmental or larger pulmonary embolus. 2. 4.0 cm ascending thoracic aorta. Recommend annual imaging followup by CTA or MRA. This recommendation follows 2010 ACCF/AHA/AATS/ACR/ASA/SCA/SCAI/SIR/STS/SVM Guidelines for the Diagnosis and Management of Patients with Thoracic Aortic Disease. Circulation. 2010; 121: W102-V253. Aortic aneurysm NOS (ICD10-I71.9) 3. Multiple sub-6 mm RIGHT pulmonary nodules in a background of centrilobular emphysematous lung change. Recommend Non-contrast chest CT in 12 months. This recommendation follows the consensus statement: Guidelines for Management of Incidental Pulmonary Nodules Detected on CT Images: From the Fleischner Society 2017; Radiology 2017; 284:228-243. 4. Incidental 1.1 cm RIGHT thyroid nodule. Recommend non-emergent, outpatient US Thyroid, if not  already performed per Society of Radiologists in Ultrasound guidelines (SRU) 2021. Electronically Signed   By: Michaelle Birks M.D.   On: 01/09/2021 15:40   CT Cervical Spine Wo Contrast  Result Date: 01/09/2021 CLINICAL DATA:  Fall.  EXAM: CT HEAD WITHOUT CONTRAST CT MAXILLOFACIAL WITHOUT CONTRAST CT CERVICAL SPINE WITHOUT CONTRAST TECHNIQUE: Multidetector CT imaging of the head, cervical spine, and maxillofacial structures were performed using the standard protocol without intravenous contrast. Multiplanar CT image reconstructions of the cervical spine and maxillofacial structures were also generated. COMPARISON:  CT head dated July 25, 2010. FINDINGS: CT HEAD FINDINGS Brain: Trace right frontal subarachnoid hemorrhage (series 3, images 20-24). No evidence of acute infarction, hydrocephalus, extra-axial collection or mass lesion/mass effect. Progressive mild generalized cerebral atrophy and moderate chronic microvascular ischemic changes. Unchanged right occipital encephalomalacia. Vascular: Calcified atherosclerosis at the skull base. No hyperdense vessel. Skull: Negative for fracture or focal lesion. Other: Small left frontal scalp hematoma. CT MAXILLOFACIAL FINDINGS Osseous: Deformity of the right maxillary sinus posterior wall suspicious for nondisplaced fracture. No destructive process. Orbits: Negative. No traumatic or inflammatory finding. Sinuses: Layering fluid/hemorrhage in the right maxillary sinus. Remaining paranasal sinuses and mastoid air cells are clear. Soft tissues: Small left periorbital hematoma. CT CERVICAL SPINE FINDINGS Alignment: 3 mm retrolisthesis at C2-C3 with right posterior facet subluxation, likely chronic in the setting of partial C2-C3 spinous process fusion. Trace retrolisthesis at C5-C6 and C6-C7. Trace anterolisthesis at C4-C5 and C7-T1. Skull base and vertebrae: No acute fracture. No primary bone lesion or focal pathologic process. Soft tissues and spinal canal: No prevertebral  fluid or swelling. No visible canal hematoma. Disc levels: C3-C4 through C5-C6 and C7-T1 interbody ankylosis. Bilateral facet fusion at C3-C4, C4-C5, and C7-T1. Severe disc height loss at C6-C7. Upper chest: Mild centrilobular and paraseptal emphysema. Other: None. IMPRESSION: 1. Trace right frontal subarachnoid hemorrhage. 2. Small left frontal scalp and periorbital hematoma. 3. Deformity of the right maxillary sinus posterior wall suspicious for nondisplaced fracture. Layering fluid/hemorrhage in the right maxillary sinus. 4. No acute cervical spine fracture or traumatic listhesis. Multilevel cervical spondylosis. 5. Emphysema (ICD10-J43.9). Critical Value/emergent results were called by telephone at the time of interpretation on 01/09/2021 at 1:17 pm to provider AMJAD ALI, who verbally acknowledged these results. Electronically Signed   By: Titus Dubin M.D.   On: 01/09/2021 13:28   MR ANGIO HEAD WO CONTRAST  Result Date: 01/10/2021 CLINICAL DATA:  Stroke, follow-up EXAM: MRA HEAD WITHOUT CONTRAST TECHNIQUE: Angiographic images of the Circle of Willis were acquired using MRA technique without intravenous contrast. COMPARISON:  CTA same day FINDINGS: Anterior circulation: Intracranial internal carotid arteries are patent. Anterior and middle cerebral arteries are patent. Posterior circulation: Intracranial vertebral arteries, basilar artery, and posterior cerebral arteries are patent. Other: No aneurysm. IMPRESSION: Unremarkable vascular imaging.  No apparent change from earlier CTA. Electronically Signed   By: Macy Mis M.D.   On: 01/10/2021 17:21   MR BRAIN WO CONTRAST  Result Date: 01/09/2021 CLINICAL DATA:  Syncope, presyncope EXAM: MRI HEAD WITHOUT CONTRAST TECHNIQUE: Multiplanar, multiecho pulse sequences of the brain and surrounding structures were obtained without intravenous contrast. COMPARISON:  04/08/2010 MRI correlation is also made with CT head 01/09/2021 FINDINGS: Brain: Small areas of  restricted diffusion with ADC correlate in the right parietal lobe ((series 4, image 31 and medial right frontal lobe (series 4, image 41), consistent with acute infarcts. No definite acute parenchymal hemorrhage. No mass, mass effect, or midline shift. Hemosiderin deposition is noted in the right occipital lobe, sequela of a prior parenchymal hematoma seen on the 04/08/2010 MRI, with associated encephalomalacia in this region. Additional punctate areas of susceptibility artifact in the bilateral cerebral and cerebellar hemispheres, which are new from the prior MRI. No hydrocephalus  or definite extra-axial collection. T2 hyperintense signal in the periventricular white matter, likely the sequela of chronic small vessel ischemic disease. Vascular: Patent flow voids. Skull and upper cervical spine: Normal marrow signal. Sinuses/Orbits: The orbits are unremarkable. Layering T1 isointense and T2 hyperintense fluid in the right maxillary sinus, which may represent hemorrhage. Other: None. IMPRESSION: 1. Small areas of restricted diffusion in the right parietal and frontal lobe, consistent with small acute infarcts. Given multiple vascular territories, an embolic etiology is suspected. 2. Punctate foci of hemosiderin deposition in the bilateral cerebral and cerebellar hemispheres, which are new compared to 20/12. These are nonspecific but can be seen in the setting of cerebral amyloid angiopathy. Electronically Signed   By: Merilyn Baba M.D.   On: 01/09/2021 19:29   ECHOCARDIOGRAM COMPLETE  Result Date: 01/09/2021    ECHOCARDIOGRAM REPORT   Patient Name:   ELMIN WIEDERHOLT Date of Exam: 01/09/2021 Medical Rec #:  [id number]      Height:       70.0 in Accession #:    [id number]     Weight:       165.0 lb Date of Birth:  1935-03-23       BSA:          1.923 m Patient Age:    12 years       BP:           135/97 mmHg Patient Gender: M              HR:           75 bpm. Exam Location:  Inpatient Procedure: 2D Echo, Cardiac  Doppler and Color Doppler Indications:    Syncope R55  History:        Patient has prior history of Echocardiogram examinations, most                 recent 12/30/2017. Syncope with fall.  Sonographer:    Merrie Roof RDCS Referring Phys: [place]  1. Left ventricular ejection fraction, by estimation, is 50 to 55%. The left ventricle has low normal function. The left ventricle has no regional wall motion abnormalities. The left ventricular internal cavity size was mildly dilated. There is mild left ventricular hypertrophy. Left ventricular diastolic parameters are indeterminate.  2. Right ventricular systolic function is normal. The right ventricular size is normal.  3. Left atrial size was severely dilated.  4. Right atrial size was mildly dilated.  5. The mitral valve is normal in structure. Trivial mitral valve regurgitation.  6. The aortic valve is normal in structure. Aortic valve regurgitation is not visualized.  7. Aortic dilatation noted. There is borderline dilatation of the ascending aorta, measuring 38 mm. FINDINGS  Left Ventricle: Left ventricular ejection fraction, by estimation, is 50 to 55%. The left ventricle has low normal function. The left ventricle has no regional wall motion abnormalities. The left ventricular internal cavity size was mildly dilated. There is mild left ventricular hypertrophy. Left ventricular diastolic parameters are indeterminate. Right Ventricle: The right ventricular size is normal. Right vetricular wall thickness was not assessed. Right ventricular systolic function is normal. Left Atrium: Left atrial size was severely dilated. Right Atrium: Right atrial size was mildly dilated. Pericardium: There is no evidence of pericardial effusion. Mitral Valve: The mitral valve is normal in structure. There is mild thickening of the mitral valve leaflet(s). Trivial mitral valve regurgitation. Tricuspid Valve: The tricuspid valve is normal in structure. Tricuspid  valve regurgitation is trivial.  Aortic Valve: The aortic valve is normal in structure. Aortic valve regurgitation is not visualized. Aortic valve mean gradient measures 4.0 mmHg. Aortic valve peak gradient measures 7.3 mmHg. Aortic valve area, by VTI measures 2.00 cm. Pulmonic Valve: The pulmonic valve was not well visualized. Pulmonic valve regurgitation is trivial. No evidence of pulmonic stenosis. Aorta: The aortic root is normal in size and structure and aortic dilatation noted. There is borderline dilatation of the ascending aorta, measuring 38 mm. IAS/Shunts: No atrial level shunt detected by color flow Doppler.  LEFT VENTRICLE PLAX 2D LVIDd:         5.30 cm LVIDs:         3.90 cm LV PW:         1.20 cm LV IVS:        1.20 cm LVOT diam:     2.00 cm LV SV:         45 LV SV Index:   23 LVOT Area:     3.14 cm  RIGHT VENTRICLE RV Basal diam:  4.50 cm RV Mid diam:    4.20 cm LEFT ATRIUM              Index        RIGHT ATRIUM           Index LA diam:        6.80 cm  3.54 cm/m   RA Area:     20.20 cm LA Vol (A2C):   98.0 ml  50.95 ml/m  RA Volume:   55.10 ml  28.65 ml/m LA Vol (A4C):   100.0 ml 51.99 ml/m LA Biplane Vol: 100.0 ml 51.99 ml/m  AORTIC VALVE AV Area (Vmax):    2.01 cm AV Area (Vmean):   1.89 cm AV Area (VTI):     2.00 cm AV Vmax:           135.00 cm/s AV Vmean:          93.000 cm/s AV VTI:            0.223 m AV Peak Grad:      7.3 mmHg AV Mean Grad:      4.0 mmHg LVOT Vmax:         86.20 cm/s LVOT Vmean:        56.000 cm/s LVOT VTI:          0.142 m LVOT/AV VTI ratio: 0.64  AORTA Ao Root diam: 3.40 cm Ao Asc diam:  3.80 cm  SHUNTS Systemic VTI:  0.14 m Systemic Diam: 2.00 cm Dorris Carnes MD Electronically signed by Dorris Carnes MD Signature Date/Time: 01/09/2021/6:13:57 PM    Final    CT Maxillofacial Wo Contrast  Result Date: 01/09/2021 CLINICAL DATA:  Fall. EXAM: CT HEAD WITHOUT CONTRAST CT MAXILLOFACIAL WITHOUT CONTRAST CT CERVICAL SPINE WITHOUT CONTRAST TECHNIQUE: Multidetector CT imaging of  the head, cervical spine, and maxillofacial structures were performed using the standard protocol without intravenous contrast. Multiplanar CT image reconstructions of the cervical spine and maxillofacial structures were also generated. COMPARISON:  CT head dated July 25, 2010. FINDINGS: CT HEAD FINDINGS Brain: Trace right frontal subarachnoid hemorrhage (series 3, images 20-24). No evidence of acute infarction, hydrocephalus, extra-axial collection or mass lesion/mass effect. Progressive mild generalized cerebral atrophy and moderate chronic microvascular ischemic changes. Unchanged right occipital encephalomalacia. Vascular: Calcified atherosclerosis at the skull base. No hyperdense vessel. Skull: Negative for fracture or focal lesion. Other: Small left frontal scalp hematoma. CT MAXILLOFACIAL FINDINGS Osseous: Deformity of  the right maxillary sinus posterior wall suspicious for nondisplaced fracture. No destructive process. Orbits: Negative. No traumatic or inflammatory finding. Sinuses: Layering fluid/hemorrhage in the right maxillary sinus. Remaining paranasal sinuses and mastoid air cells are clear. Soft tissues: Small left periorbital hematoma. CT CERVICAL SPINE FINDINGS Alignment: 3 mm retrolisthesis at C2-C3 with right posterior facet subluxation, likely chronic in the setting of partial C2-C3 spinous process fusion. Trace retrolisthesis at C5-C6 and C6-C7. Trace anterolisthesis at C4-C5 and C7-T1. Skull base and vertebrae: No acute fracture. No primary bone lesion or focal pathologic process. Soft tissues and spinal canal: No prevertebral fluid or swelling. No visible canal hematoma. Disc levels: C3-C4 through C5-C6 and C7-T1 interbody ankylosis. Bilateral facet fusion at C3-C4, C4-C5, and C7-T1. Severe disc height loss at C6-C7. Upper chest: Mild centrilobular and paraseptal emphysema. Other: None. IMPRESSION: 1. Trace right frontal subarachnoid hemorrhage. 2. Small left frontal scalp and periorbital  hematoma. 3. Deformity of the right maxillary sinus posterior wall suspicious for nondisplaced fracture. Layering fluid/hemorrhage in the right maxillary sinus. 4. No acute cervical spine fracture or traumatic listhesis. Multilevel cervical spondylosis. 5. Emphysema (ICD10-J43.9). Critical Value/emergent results were called by telephone at the time of interpretation on 01/09/2021 at 1:17 pm to provider AMJAD ALI, who verbally acknowledged these results. Electronically Signed   By: Titus Dubin M.D.   On: 01/09/2021 13:28    DISCHARGE EXAMINATION: Vitals:   01/14/21 0519 01/14/21 1035 01/14/21 1143 01/14/21 1216  BP: (!) 144/91 (!) 137/93  136/64  Pulse: 67 87 (!) 101 79  Resp: 18 20  17   Temp: 97.8 F (36.6 C) 97.7 F (36.5 C)  98 F (36.7 C)  TempSrc: Oral Oral  Oral  SpO2: 97% 95%  98%  Weight: 75.9 kg     Height:       See progress note from earlier today  DISPOSITION: SNF  Discharge Instructions     Ambulatory referral to Neurology   Complete by: As directed    An appointment is requested in approximately: 8 weeks   Call MD for:  difficulty breathing, headache or visual disturbances   Complete by: As directed    Call MD for:  extreme fatigue   Complete by: As directed    Call MD for:  hives   Complete by: As directed    Call MD for:  persistant dizziness or light-headedness   Complete by: As directed    Call MD for:  persistant nausea and vomiting   Complete by: As directed    Call MD for:  severe uncontrolled pain   Complete by: As directed    Call MD for:  temperature >100.4   Complete by: As directed    Diet - low sodium heart healthy   Complete by: As directed    Discharge instructions   Complete by: As directed    Please review instructions on the discharge summary.  You were cared for by a hospitalist during your hospital stay. If you have any questions about your discharge medications or the care you received while you were in the hospital after you are  discharged, you can call the unit and asked to speak with the hospitalist on call if the hospitalist that took care of you is not available. Once you are discharged, your primary care physician will handle any further medical issues. Please note that NO REFILLS for any discharge medications will be authorized once you are discharged, as it is imperative that you return to your primary care  physician (or establish a relationship with a primary care physician if you do not have one) for your aftercare needs so that they can reassess your need for medications and monitor your lab values. If you do not have a primary care physician, you can call [phone] for a physician referral.   Discharge wound care:   Complete by: As directed    Suture removal on 01/15/2021   Increase activity slowly   Complete by: As directed           Allergies as of 01/14/2021       Reactions   Ace Inhibitors Cough   Warfarin And Related Other (See Comments)   Intercranial bleed        Medication List     STOP taking these medications    ibuprofen 200 MG tablet Commonly known as: ADVIL   Sutab [id number] MG Tabs Generic drug: Sodium Sulfate-Mag Sulfate-KCl       TAKE these medications    acetaminophen 325 MG tablet Commonly known as: TYLENOL Take 650 mg by mouth every 6 (six) hours as needed for mild pain.   allopurinol 100 MG tablet Commonly known as: ZYLOPRIM Take 100 mg by mouth at bedtime.   aspirin 325 MG tablet Take 1 tablet (325 mg total) by mouth daily. Start taking on: January 15, 2021 What changed:  medication strength how much to take   cycloSPORINE 0.05 % ophthalmic emulsion Commonly known as: RESTASIS Place 1 drop into both eyes 2 (two) times daily as needed (dry eyes, irritation).   diclofenac Sodium 1 % Gel Commonly known as: VOLTAREN Apply 2-4 g topically 4 (four) times daily. What changed:  when to take this reasons to take this   diltiazem 240 MG 24 hr  capsule Commonly known as: CARDIZEM CD TAKE 1 CAPSULE DAILY. PLEASE CALL OFFICE TO SCHEDULE APPOINTMENT FOR FURTHER REFILLS What changed: See the new instructions.   docusate sodium 100 MG capsule Commonly known as: COLACE Take 100 mg by mouth daily as needed for mild constipation.   donepezil 5 MG tablet Commonly known as: ARICEPT Take 5 mg by mouth at bedtime.   famotidine 20 MG tablet Commonly known as: PEPCID Take 20 mg by mouth at bedtime as needed for heartburn or indigestion.   hydroxyurea 500 MG capsule Commonly known as: HYDREA TAKE 1 CAPSULE (500 MG TOTAL) BY MOUTH DAILY. MAY TAKE WITH FOOD TO MINIMIZE GI SIDE EFFECTS.   LORazepam 1 MG tablet Commonly known as: ATIVAN Take 0.5-1 tablets (0.5-1 mg total) by mouth 2 (two) times daily as needed for anxiety.   losartan 25 MG tablet Commonly known as: COZAAR TAKE 1 TABLET DAILY. PLEASE MAKE YEARLY APPT WITH DR. ALLRED FOR OCTOBER 2022 FOR FUTURE REFILLS What changed: See the new instructions.   methocarbamol 500 MG tablet Commonly known as: ROBAXIN Take 1 tablet (500 mg total) by mouth every 8 (eight) hours as needed for muscle spasms.   Olopatadine HCl 0.2 % Soln Place 1 drop into both eyes in the morning and at bedtime.   pantoprazole 40 MG tablet Commonly known as: PROTONIX Take 1 tablet (40 mg total) by mouth daily.   rosuvastatin 10 MG tablet Commonly known as: CRESTOR Take 10 mg by mouth daily.   vitamin C 1000 MG tablet Take 1,000 mg by mouth daily.               Discharge Care Instructions  (From admission, onward)  Start     Ordered   01/14/21 0000  Discharge wound care:       Comments: Suture removal on 01/15/2021   01/14/21 1223              Follow-up Information     Luppens, Quillian Quince, MD. Schedule an appointment as soon as possible for a visit in 1 week(s).   Specialty: Plastic Surgery Contact information: [address], [place]  [address] [phone]                 TOTAL DISCHARGE TIME: 35 mins  [place] [facility] Pager on [email or web address]  01/14/2021, 12:31 PM

## 2021-01-14 NOTE — TOC Progression Note (Signed)
Transition of Care [facility]) - Progression Note    Patient Details  Name: LISTON THUM MRN: [id number] Date of Birth: 06-26-1935  Transition of Care [facility]) CM/SW [place], [place] Phone Number: 01/14/2021, 1:35 PM  Clinical Narrative:   CSW noting per chart review that [facility] had declined referral. CSW contacted Melissa to discuss, and she asked for time to review other options. Family interested in [facility], [facility], or [facility]. CSW confirmed bed offers at [facility] and [facility], and family chose [facility]. [facility] has a bed available today, pending patient's negative covid test and receiving booster shot prior to DC. CSW updated MD and RN, following for discharge later today.    Expected Discharge Plan: [place] Barriers to Discharge: Continued Medical Work up, Ship broker  Expected Discharge Plan and Services Expected Discharge Plan: [place] Choice: [place] arrangements for the past 2 months: [place]                                       Social Determinants of Health (SDOH) Interventions    Readmission Risk Interventions No flowsheet data found.

## 2021-01-14 NOTE — TOC Transition Note (Signed)
Transition of Care [facility]) - CM/SW Discharge Note   Patient Details  Name: Dean Pratt MRN: [id number] Date of Birth: 1935-09-18  Transition [facility]) CM/SW Contact:  Geralynn Ochs, LCSW Phone Number: 01/14/2021, 2:34 PM   Clinical Narrative:   Nurse to call report to [phone], Room 211-1.  Transport scheduled for 5:00 PM.    Final next level of care: [place] Barriers to Discharge: Barriers Resolved   Patient Goals and CMS Choice Patient states their goals for this hospitalization and ongoing recovery are:: to get rehab CMS Medicare.gov Compare Post Acute Care list provided to:: Patient Choice offered to / list presented to : Patient, Adult Children  Discharge Placement              Patient chooses bed at: [facility] Patient to be transferred to facility by: [place] Name of family member notified: Melissa Patient and family notified of of transfer: 01/14/21  Discharge Plan and Services     Post Acute Care Choice: [place]                               Social Determinants of Health (SDOH) Interventions     Readmission Risk Interventions No flowsheet data found.

## 2021-01-14 NOTE — Progress Notes (Signed)
Physical Therapy Treatment Patient Details Name: Dean Pratt MRN: [id number] DOB: 04/24/1935 Today's Date: 01/14/2021   History of Present Illness Dean Pratt is a 86 y.o. male presenting with syncope. Found to have [place] along with a possible right maxillary sinus non displaced fracture and MRI brain small acute stroke in the R parietal and frontal lobe.  PHMx: afib not on AC; BPH; HTN; HLD; and ICH (2012)    PT Comments    Patient had improved endurance with walking requiring a rollator to walk a total of 370ft taking 3 breaks. With sit to stand exercise patient required a sitting break between repetitions, extended time to stand up, and UE support. Patient required a lot of consistent verbal cuing during exercises and instruction with rollator. With balance activities he felt steady with a wide base of support and required contact guard. Patient would benefit for continued skilled physical therapy to address endurance, strength, and repetition to reinforce learning in order to safely transfer and ambulate with rollator independently.   Recommendations for follow up therapy are one component of a multi-disciplinary discharge planning process, led by the attending physician.  Recommendations may be updated based on patient status, additional functional criteria and insurance authorization.  Follow Up Recommendations  Skilled nursing-short term rehab (<3 hours/day)     Assistance Recommended at Discharge Frequent or constant Supervision/Assistance  Patient can return home with the following A little help with walking and/or transfers;Assistance with cooking/housework;Assist for transportation;A little help with bathing/dressing/bathroom   Equipment Recommendations  Rollator (4 wheels)    Recommendations for Other Services       Precautions / Restrictions       Mobility  Bed Mobility Overal bed mobility: Needs Assistance Bed Mobility: Supine to Sit     Supine to sit:  Supervision;Modified independent (Device/Increase time) Sit to supine: Modified independent (Device/Increase time)   General bed mobility comments: Needed consistant verbal cuing and increased time Patient Response: Cooperative  Transfers Overall transfer level: Needs assistance Equipment used: Rollator (4 wheels) Transfers: Sit to/from Stand Sit to Stand: Min guard           General transfer comment: Required increased time to stand and min assist for ballance    Ambulation/Gait Ambulation/Gait assistance: Supervision;Modified independent (Device/Increase time) Gait Distance (Feet): 130 Feet (2x150) Assistive device: Rollator (4 wheels) Gait Pattern/deviations: Step-through pattern;Decreased step length - right;Wide base of support Gait velocity: Decreased Gait velocity interpretation: <1.31 ft/sec, indicative of household ambulator Pre-gait activities: Sit to Stand x5 General Gait Details: Patient was able to walk with rollator well but was unsteady and times and required min assist/supervision   Stairs             Wheelchair Mobility    Modified Rankin (Stroke Patients Only) Modified Rankin (Stroke Patients Only) Pre-Morbid Rankin Score: No symptoms Modified Rankin: Moderately severe disability     Balance Overall balance assessment: Needs assistance Sitting-balance support: Single extremity supported Sitting balance-Leahy Scale: Good Sitting balance - Comments: has a slumped forward posture   Standing balance support: No upper extremity supported Standing balance-Leahy Scale: Poor Standing balance comment: Patient required contact gaurd while standing with wide base of support without UE support. With more advanced balance (rhomberg, tandem, and single leg) patient required min-mod assited and UE support. Single Leg Stance - Right Leg: 25 (With UE support) Single Leg Stance - Left Leg: 25 (with UE support) Tandem Stance - Right Leg: 15 (single UE  support) Tandem Stance - Left Leg: 15 (  with single UE support) Rhomberg - Eyes Opened: 25 (with single UE support)                  Cognition Arousal/Alertness: Awake/alert Behavior During Therapy: WFL for tasks assessed/performed Overall Cognitive Status: Impaired/Different from baseline Area of Impairment: Memory;Safety/judgement;Problem solving                 Orientation Level: Situation;Person   Memory: Decreased short-term memory   Safety/Judgement: Decreased awareness of safety;Decreased awareness of deficits Awareness: Emergent Problem Solving: Difficulty sequencing;Requires verbal cues;Requires tactile cues;Slow processing General Comments: During sit to stands and ambulation with rollator patient required a lot of verbal, tactile, and gesturing cues. Patient had trouble with sequencing with sitting down, applying the breaks, and standing up, on and off the rollator.        Exercises      General Comments General comments (skin integrity, edema, etc.): pt did not complain of any dizziness, SOB, or pain during exercises.      Pertinent Vitals/Pain Pain Assessment: No/denies pain    Home Living                          Prior Function            PT Goals (current goals can now be found in the care plan section) Progress towards PT goals: Progressing toward goals    Frequency    Min 4X/week      PT Plan Current plan remains appropriate    Co-evaluation              AM-PAC PT "6 Clicks" Mobility   Outcome Measure  Help needed turning from your back to your side while in a flat bed without using bedrails?: A Little Help needed moving from lying on your back to sitting on the side of a flat bed without using bedrails?: A Little Help needed moving to and from a bed to a chair (including a wheelchair)?: A Little Help needed standing up from a chair using your arms (e.g., wheelchair or bedside chair)?: A Little Help needed to  walk in hospital room?: None   6 Click Score: 16    End of Session Equipment Utilized During Treatment: Gait belt Activity Tolerance: Patient tolerated treatment well Patient left: in chair;with call bell/phone within reach Nurse Communication: Mobility status PT Visit Diagnosis: Unsteadiness on feet (R26.81);Difficulty in walking, not elsewhere classified (R26.2);History of falling (Z91.81)     Time: 5170-0174 PT Time Calculation (min) (ACUTE ONLY): 33 min  Charges:  $Gait Training: 8-22 mins $Neuromuscular Re-education: 8-22 mins                     Quenton Fetter, SPT   Quenton Fetter 01/14/2021, 1:21 PM

## 2021-01-14 NOTE — Progress Notes (Signed)
[facility] PROGRESS NOTE   Dean Pratt TJQ:[id number] DOB: September 25, 1935 DOA: 01/09/2021  PCP: Dean Baton, MD  Brief History/Interval Summary: Dean Pratt is a 86 y.o. male with medical history significant of afib not on [facility]; BPH; HTN; HLD; and ICH (2012) presenting with syncope.  He got up and thinks he looked out the door.  The next thing he knew he was trying to get up out of the floor.  In the emergency department he underwent CT head which showed trace subarachnoid hemorrhage.  MRI brain showed small acute infarcts of the right parietal and frontal lobe.  Patient was seen by neurology. Started on aspirin.  Seen by PT and OT.  Plan is for him to go to skilled nursing facility for short-term rehab.  Reason for Visit: Acute stroke  Consultants: Neurology  Procedures: Transthoracic echocardiogram    Subjective/Interval History: Patient denies any complaints this morning.  No chest pain shortness of breath nausea vomiting.  No headaches.  No weakness on any 1 side of the body.  Denies any visual disturbances.    Assessment/Plan:  Syncope and collapse- (present on admission) -Etiology is not clear. The differential diagnosis is broad, including vasovagal syncope, seizure, TIA/stroke, arrhythmia (has known afib), ACS (no CP, negative troponin x 2), hypoglycemia, orthostatic status, carotid artery stenosis No further episodes of syncope here in the hospital.  Seen by physical and Occupational Therapy.   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  Patient was seen by neurology.  Started on aspirin 325 mg daily.  Also noted to be on statin.   HbA1c 5.2.  LDL 39. Seen by PT and OT.  SNF is recommended.   Subarachnoid hemorrhage following injury with brief loss of consciousness but without open intracranial wound  ([place]) -Patient d/w Dean Pratt by EDP -This is not an unexpected finding given his head trauma -No further evaluation is needed   Maxillary sinus fracture, closed, initial encounter [facility])- (present on admission) Admitting provider spoke with Dean Pratt. -He recommends outpatient referral for Dean Pratt to see in the next week or so -There is nothing that needs to be done acutely during this hospitalization   Dementia without behavioral disturbance ([place])- (present on admission) Stable -Continue Aricept, Ativan    Thyroid nodule greater than or equal to 1 cm in diameter incidentally noted on imaging study- (present on admission) -Recommended for non-emergent outpatient [place] -Dean Pratt is aware and will f/u with PCP to request   Pulmonary nodules/lesions, multiple- (present on admission) -appreciated on CTA -Needs non-contrast CT repeated in 12 months   Thoracic ascending aortic aneurysm- (present on admission) -4 cm -Needs annual imaging by CTA or MRA   COPD (chronic obstructive pulmonary disease) ([place])- (present on admission) -Emphysema noted on CTA -He is not taking medications for this issue currently Respiratory status is stable.   Polycythemia vera ([place])- (present on admission) -Previously had prn phlebotomy -Was started on hydroxyurea by Dean Pratt Continue hydroxyurea.  Outpatient follow-up with Dean Pratt.   ATRIAL FIBRILLATION- (present on admission) -Rate controlled with Cardizem -He has been on 81 mg ASA since his prior hemorrhagic stroke in 2012 -Holding anticoagulation due to history of cerebral hemorrhage.  Currently on aspirin 325 mg daily.   Essential hypertension- (present on admission) Cardizem was initially held due to stroke and need for permissive hypertension.  Now resumed.  Blood pressure reasonably well controlled.   Was also on Cozaar which remains on hold.  Renal function is stable.  Could be resumed depending on blood pressure trends.    Dyslipidemia- (present on admission) -Continue Crestor   Debility: PT has recommended SNF.    DVT Prophylaxis: SCD's Code Status: DNR   Family Communication: No family at bedside Disposition: SNF    Status is: Inpatient  Remains inpatient appropriate because: Acute stroke    Medications: Scheduled:  allopurinol  100 mg Oral QHS   aspirin  325 mg Oral Daily   diltiazem  240 mg Oral Daily   donepezil  5 mg Oral QHS   hydroxyurea  500 mg Oral Daily   olopatadine  1 drop Both Eyes BID   pantoprazole  40 mg Oral Daily   rosuvastatin  10 mg Oral Daily   sodium chloride flush  3 mL Intravenous Q12H   Continuous: KPT:WSFKCLEXNTZGY **OR** acetaminophen, cycloSPORINE, LORazepam, methocarbamol, morphine injection, ondansetron **OR** ondansetron (ZOFRAN) IV, oxyCODONE  Antibiotics: Anti-infectives (From admission, onward)    None       Objective:  Vital Signs  Vitals:   01/13/21 1618 01/13/21 1953 01/13/21 2343 01/14/21 0519  BP: 132/84 137/77 (!) 141/93 (!) 144/91  Pulse: 74 67 86 67  Resp: 19 17 19 18   Temp: (!) 97.3 F (36.3 C) 98.3 F (36.8 C) 98.6 F (37 C) 97.8 F (36.6 C)  TempSrc: Oral Oral Oral Oral  SpO2: 96% 95% 98% 97%  Weight:    75.9 kg  Height:       No intake or output data in the 24 hours ending 01/14/21 0936 Filed Weights   01/12/21 0354 01/13/21 0508 01/14/21 0519  Weight: 72.8 kg 74.7 kg 75.9 kg    General appearance: Awake alert.  In no distress Resp: Clear to auscultation bilaterally.  Normal effort Cardio: S1-S2 is normal regular.  No S3-S4.  No rubs murmurs or bruit GI: Abdomen is soft.  Nontender nondistended.  Bowel sounds are present normal.  No masses organomegaly Extremities: No edema.  Full range of motion of lower extremities. Neurologic:  No focal neurological deficits.    Lab Results:  Data Reviewed: I have personally reviewed following labs and imaging studies  CBC: Recent Labs  Lab 01/09/21 1031 01/10/21 0532  01/11/21 0241 01/13/21 0241  WBC 10.5 10.7* 10.2 8.8  NEUTROABS 8.8*  --  8.8* 6.7  HGB 16.1 14.9 14.7 13.9  HCT 50.8 46.4 44.5 43.1  MCV 88.3 86.6 85.7 87.1  PLT 337 311 320 174    Basic Metabolic Panel: Recent Labs  Lab 01/09/21 1135 01/09/21 1340 01/10/21 0532 01/11/21 0241 01/13/21 0241  NA 138  --  136 139 136  K 3.6  --  4.9 3.8 4.0  CL 107  --  105 107 102  CO2 19*  --  22 25 27   GLUCOSE 140*  --  110* 131* 125*  BUN 12  --  10 7* 12  CREATININE 1.31*  --  1.12 1.17 1.24  CALCIUM 9.6  --  9.0 9.3 9.2  MG  --  1.7  --   --   --     GFR: Estimated Creatinine Clearance: 46.6 mL/min (by C-G formula based on SCr of 1.24 mg/dL).   Coagulation Profile: Recent Labs  Lab 01/09/21 1031  INR 1.2     Recent Results (from the past 240 hour(s))  Resp Panel by RT-PCR (Flu A&B, Covid) Nasopharyngeal Swab  Status: None   Collection Time: 01/10/21  9:04 AM   Specimen: Nasopharyngeal Swab; Nasopharyngeal(NP) swabs in vial transport medium  Result Value Ref Range Status   SARS Coronavirus 2 by RT PCR NEGATIVE NEGATIVE Final    Comment: (NOTE) SARS-CoV-2 target nucleic acids are NOT DETECTED.  The SARS-CoV-2 RNA is generally detectable in upper respiratory specimens during the acute phase of infection. The lowest concentration of SARS-CoV-2 viral copies this assay can detect is 138 copies/mL. A negative result does not preclude SARS-Cov-2 infection and should not be used as the sole basis for treatment or other patient management decisions. A negative result may occur with  improper specimen collection/handling, submission of specimen other than nasopharyngeal swab, presence of viral mutation(s) within the areas targeted by this assay, and inadequate number of viral copies(<138 copies/mL). A negative result must be combined with clinical observations, patient history, and epidemiological information. The expected result is Negative.  Fact Sheet for Patients:   EntrepreneurPulse.com.au  Fact Sheet for Healthcare Providers:  IncredibleEmployment.be  This test is no t yet approved or cleared by the [place] FDA and  has been authorized for detection and/or diagnosis of SARS-CoV-2 by FDA under an Emergency Use Authorization (EUA). This EUA will remain  in effect (meaning this test can be used) for the duration of the COVID-19 declaration under Section 564(b)(1) of the Act, 21 U.S.C.section 360bbb-3(b)(1), unless the authorization is terminated  or revoked sooner.       Influenza A by PCR NEGATIVE NEGATIVE Final   Influenza B by PCR NEGATIVE NEGATIVE Final    Comment: (NOTE) The Xpert Xpress SARS-CoV-2/FLU/RSV plus assay is intended as an aid in the diagnosis of influenza from Nasopharyngeal swab specimens and should not be used as a sole basis for treatment. Nasal washings and aspirates are unacceptable for Xpert Xpress SARS-CoV-2/FLU/RSV testing.  Fact Sheet for Patients: EntrepreneurPulse.com.au  Fact Sheet for Healthcare Providers: IncredibleEmployment.be  This test is not yet approved or cleared by the [place] FDA and has been authorized for detection and/or diagnosis of SARS-CoV-2 by FDA under an Emergency Use Authorization (EUA). This EUA will remain in effect (meaning this test can be used) for the duration of the COVID-19 declaration under Section 564(b)(1) of the Act, 21 U.S.C. section 360bbb-3(b)(1), unless the authorization is terminated or revoked.  Performed at [facility], [place] [address], [place], [place] [address]       Radiology Studies: No results found.     LOS: 4 days   Coleson Kant [employer] on [email or web address]  01/14/2021, 9:36 AM

## 2021-01-16 ENCOUNTER — Encounter: Payer: Self-pay | Admitting: Internal Medicine

## 2021-01-20 DIAGNOSIS — I10 Essential (primary) hypertension: Secondary | ICD-10-CM | POA: Diagnosis not present

## 2021-01-24 DIAGNOSIS — I509 Heart failure, unspecified: Secondary | ICD-10-CM | POA: Diagnosis not present

## 2021-01-24 DIAGNOSIS — I480 Paroxysmal atrial fibrillation: Secondary | ICD-10-CM | POA: Diagnosis not present

## 2021-01-24 DIAGNOSIS — M47816 Spondylosis without myelopathy or radiculopathy, lumbar region: Secondary | ICD-10-CM | POA: Diagnosis not present

## 2021-01-24 DIAGNOSIS — J432 Centrilobular emphysema: Secondary | ICD-10-CM | POA: Diagnosis not present

## 2021-01-24 DIAGNOSIS — E041 Nontoxic single thyroid nodule: Secondary | ICD-10-CM | POA: Diagnosis not present

## 2021-01-24 DIAGNOSIS — D45 Polycythemia vera: Secondary | ICD-10-CM | POA: Diagnosis not present

## 2021-01-24 DIAGNOSIS — I6340 Cerebral infarction due to embolism of unspecified cerebral artery: Secondary | ICD-10-CM | POA: Diagnosis not present

## 2021-01-24 DIAGNOSIS — R69 Illness, unspecified: Secondary | ICD-10-CM | POA: Diagnosis not present

## 2021-01-24 DIAGNOSIS — I7121 Aneurysm of the ascending aorta, without rupture: Secondary | ICD-10-CM | POA: Diagnosis not present

## 2021-01-24 DIAGNOSIS — S066X1D Traumatic subarachnoid hemorrhage with loss of consciousness of 30 minutes or less, subsequent encounter: Secondary | ICD-10-CM | POA: Diagnosis not present

## 2021-01-24 DIAGNOSIS — I110 Hypertensive heart disease with heart failure: Secondary | ICD-10-CM | POA: Diagnosis not present

## 2021-01-27 ENCOUNTER — Other Ambulatory Visit: Payer: Self-pay

## 2021-01-27 ENCOUNTER — Ambulatory Visit: Payer: Medicare HMO | Admitting: Plastic Surgery

## 2021-01-27 ENCOUNTER — Encounter: Payer: Self-pay | Admitting: Plastic Surgery

## 2021-01-27 VITALS — BP 142/71 | HR 66 | Ht 70.0 in | Wt 159.0 lb

## 2021-01-27 DIAGNOSIS — S02401A Maxillary fracture, unspecified, initial encounter for closed fracture: Secondary | ICD-10-CM

## 2021-01-29 NOTE — Progress Notes (Signed)
Referring Provider [name], MD [address] [place],  [place] [address]   CC:  Maxillary sinus fracture   Dean Pratt is an 86 y.o. male.  HPI: Patient is a 86 year old male who has a right maxillary sinus fracture.  The patient had a fall.  He denies any significant symptoms that are ongoing.  No double vision or other complaints.  Allergies  Allergen Reactions   Ace Inhibitors Cough   Warfarin And Related Other (See Comments)    Intercranial bleed    Outpatient Encounter Medications as of 01/27/2021  Medication Sig   acetaminophen (TYLENOL) 325 MG tablet Take 650 mg by mouth every 6 (six) hours as needed for mild pain.   allopurinol (ZYLOPRIM) 100 MG tablet Take 100 mg by mouth at bedtime.     Ascorbic Acid (VITAMIN C) 1000 MG tablet Take 1,000 mg by mouth daily.    aspirin 325 MG tablet Take 1 tablet (325 mg total) by mouth daily.   cycloSPORINE (RESTASIS) 0.05 % ophthalmic emulsion Place 1 drop into both eyes 2 (two) times daily as needed (dry eyes, irritation).   diclofenac Sodium (VOLTAREN) 1 % GEL Apply 2-4 g topically 4 (four) times daily. (Patient taking differently: Apply 2-4 g topically 4 (four) times daily as needed (arthritis pain).)   diltiazem (CARDIZEM CD) 240 MG 24 hr capsule TAKE 1 CAPSULE DAILY. PLEASE CALL OFFICE TO SCHEDULE APPOINTMENT FOR FURTHER REFILLS (Patient taking differently: Take 240 mg by mouth daily.)   docusate sodium (COLACE) 100 MG capsule Take 100 mg by mouth daily as needed for mild constipation.   donepezil (ARICEPT) 5 MG tablet Take 5 mg by mouth at bedtime.   famotidine (PEPCID) 20 MG tablet Take 20 mg by mouth at bedtime as needed for heartburn or indigestion.   hydroxyurea (HYDREA) 500 MG capsule TAKE 1 CAPSULE (500 MG TOTAL) BY MOUTH DAILY. MAY TAKE WITH FOOD TO MINIMIZE GI SIDE EFFECTS.   LORazepam (ATIVAN) 1 MG tablet Take 0.5-1 tablets (0.5-1 mg total) by mouth 2 (two) times daily as needed for anxiety.   losartan (COZAAR) 25 MG  tablet TAKE 1 TABLET DAILY. PLEASE MAKE YEARLY APPT WITH DR. ALLRED FOR OCTOBER 2022 FOR FUTURE REFILLS (Patient taking differently: Take 25 mg by mouth daily.)   methocarbamol (ROBAXIN) 500 MG tablet Take 1 tablet (500 mg total) by mouth every 8 (eight) hours as needed for muscle spasms.   Olopatadine HCl 0.2 % SOLN Place 1 drop into both eyes in the morning and at bedtime.   pantoprazole (PROTONIX) 40 MG tablet Take 1 tablet (40 mg total) by mouth daily.   rosuvastatin (CRESTOR) 10 MG tablet Take 10 mg by mouth daily.   No facility-administered encounter medications on file as of 01/27/2021.     Past Medical History:  Diagnosis Date   Anxiety    Blood transfusion without reported diagnosis    BPH (benign prostatic hyperplasia)    Cancer (HCC)    basal cell CA removed   Cataract    removed with lens implants    CHF (congestive heart failure) ([place])    Colitis, ischemic (HCC)    secondary to ebolism from afib 1/10   Constipation    GERD (gastroesophageal reflux disease)    Gout    HTN (hypertension)    Hyperlipidemia    Intracranial bleed ([place]) 04/10/2010   Right occipital hematoma with a left homonymous hemianopsia    Migraines    Paroxysmal atrial fibrillation ([place])  s/p PVI 04/09/09 and 10/17/10   Rosacea    Stroke ([place]) 04/08/2010   hemorragic, anticoagulation stopped at that time   Tuberculosis    s/p treatment 1964    Past Surgical History:  Procedure Laterality Date   ablation  04/09/2009   s/p afib and atrial flutter ablation by [place] DYSRHYTHMIC FOCUS  10/15/09   repeat afib ablation by [facility]   APPENDECTOMY     CATARACT EXTRACTION     CATARACT EXTRACTION, BILATERAL     with lens implants    COLONOSCOPY     CORNEAL LACERATION REPAIR     EYE SURGERY     INGUINAL HERNIA REPAIR     MASS EXCISION Left 12/20/2020   Procedure: EXCISION  SOFT TISSUE MASS LEFT FLANK;  Surgeon: Armandina Gemma, MD;  Location: [facility];  Service: General;   Laterality: Left;   POLYPECTOMY     TONSILLECTOMY     VASECTOMY      Family History  Problem Relation Age of Onset   Liver cancer Father    Prostate cancer Father    Coronary artery disease Father    COPD Mother    Breast cancer Sister    Colon cancer Neg Hx    Rectal cancer Neg Hx    Stomach cancer Neg Hx    Esophageal cancer Neg Hx    Colon polyps Neg Hx     Social History   Social History Narrative   Pt lives in [place].    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  He is married and lives at home with his wife.  He used to smoke but quit  smoking many years ago.  Does get regular exercise.  No alcohol use.      Review of Systems General: Denies fevers, chills, weight loss CV: Denies chest pain, shortness of breath, palpitations   Physical Exam Vitals with BMI 01/27/2021 01/14/2021 01/14/2021  Height 5\' 10"  - -  Weight 159 lbs - -  BMI 97.41 - -  Systolic [phone]  Diastolic 71 95 94  Pulse 66 72 77    General:  No acute distress,  Alert and oriented, Non-Toxic, Normal speech and affect HEENT: Face appears symmetric with no facial deformity noted.  No enophthalmos.   CT max face: Right side possible maxillary sinus fracture, some blood in the maxillary sinus.  Possibly could involve a portion of the posterior orbit. Assessment/Plan Possible maxillary sinus fracture that is nondisplaced.  The patient is asymptomatic and his face is symmetric.  He will follow-up as needed.  35 minutes were spent with the patient.  Time was spent reviewing records, discussing surgical procedures with the patient and reviewing risks and benefits.  We discussed treatment for maxillary sinus fracture that is nondisplaced. Lennice Sites 01/29/2021, 8:58 AM

## 2021-01-30 DIAGNOSIS — M47816 Spondylosis without myelopathy or radiculopathy, lumbar region: Secondary | ICD-10-CM | POA: Diagnosis not present

## 2021-01-30 DIAGNOSIS — E041 Nontoxic single thyroid nodule: Secondary | ICD-10-CM | POA: Diagnosis not present

## 2021-01-30 DIAGNOSIS — R69 Illness, unspecified: Secondary | ICD-10-CM | POA: Diagnosis not present

## 2021-01-30 DIAGNOSIS — D45 Polycythemia vera: Secondary | ICD-10-CM | POA: Diagnosis not present

## 2021-01-30 DIAGNOSIS — S066X1D Traumatic subarachnoid hemorrhage with loss of consciousness of 30 minutes or less, subsequent encounter: Secondary | ICD-10-CM | POA: Diagnosis not present

## 2021-01-30 DIAGNOSIS — I480 Paroxysmal atrial fibrillation: Secondary | ICD-10-CM | POA: Diagnosis not present

## 2021-01-30 DIAGNOSIS — J432 Centrilobular emphysema: Secondary | ICD-10-CM | POA: Diagnosis not present

## 2021-01-30 DIAGNOSIS — I7121 Aneurysm of the ascending aorta, without rupture: Secondary | ICD-10-CM | POA: Diagnosis not present

## 2021-01-30 DIAGNOSIS — I110 Hypertensive heart disease with heart failure: Secondary | ICD-10-CM | POA: Diagnosis not present

## 2021-01-30 DIAGNOSIS — I509 Heart failure, unspecified: Secondary | ICD-10-CM | POA: Diagnosis not present

## 2021-02-07 DIAGNOSIS — E785 Hyperlipidemia, unspecified: Secondary | ICD-10-CM | POA: Diagnosis not present

## 2021-02-07 DIAGNOSIS — N182 Chronic kidney disease, stage 2 (mild): Secondary | ICD-10-CM | POA: Diagnosis not present

## 2021-02-07 DIAGNOSIS — M542 Cervicalgia: Secondary | ICD-10-CM | POA: Diagnosis not present

## 2021-02-07 DIAGNOSIS — I480 Paroxysmal atrial fibrillation: Secondary | ICD-10-CM | POA: Diagnosis not present

## 2021-02-07 DIAGNOSIS — I7121 Aneurysm of the ascending aorta, without rupture: Secondary | ICD-10-CM | POA: Diagnosis not present

## 2021-02-07 DIAGNOSIS — I6930 Unspecified sequelae of cerebral infarction: Secondary | ICD-10-CM | POA: Diagnosis not present

## 2021-02-07 DIAGNOSIS — F03A0 Unspecified dementia, mild, without behavioral disturbance, psychotic disturbance, mood disturbance, and anxiety: Secondary | ICD-10-CM | POA: Diagnosis not present

## 2021-02-07 DIAGNOSIS — E039 Hypothyroidism, unspecified: Secondary | ICD-10-CM | POA: Diagnosis not present

## 2021-02-07 DIAGNOSIS — S0240DA Maxillary fracture, left side, initial encounter for closed fracture: Secondary | ICD-10-CM | POA: Diagnosis not present

## 2021-02-07 DIAGNOSIS — R911 Solitary pulmonary nodule: Secondary | ICD-10-CM | POA: Diagnosis not present

## 2021-02-07 DIAGNOSIS — I129 Hypertensive chronic kidney disease with stage 1 through stage 4 chronic kidney disease, or unspecified chronic kidney disease: Secondary | ICD-10-CM | POA: Diagnosis not present

## 2021-02-07 DIAGNOSIS — J439 Emphysema, unspecified: Secondary | ICD-10-CM | POA: Diagnosis not present

## 2021-02-11 ENCOUNTER — Other Ambulatory Visit: Payer: Self-pay | Admitting: Internal Medicine

## 2021-02-11 DIAGNOSIS — E041 Nontoxic single thyroid nodule: Secondary | ICD-10-CM

## 2021-02-19 ENCOUNTER — Encounter: Payer: Self-pay | Admitting: Internal Medicine

## 2021-02-20 ENCOUNTER — Ambulatory Visit
Admission: RE | Admit: 2021-02-20 | Discharge: 2021-02-20 | Disposition: A | Discharge status: Home / Self Care | Payer: Self-pay | Source: Ambulatory Visit | Attending: Internal Medicine | Admitting: Internal Medicine

## 2021-02-20 ENCOUNTER — Other Ambulatory Visit: Payer: Self-pay | Admitting: Internal Medicine

## 2021-02-20 DIAGNOSIS — E041 Nontoxic single thyroid nodule: Secondary | ICD-10-CM

## 2021-03-06 ENCOUNTER — Encounter: Payer: Self-pay | Admitting: Internal Medicine

## 2021-03-13 ENCOUNTER — Ambulatory Visit: Payer: HMO | Admitting: Neurology

## 2021-03-13 ENCOUNTER — Other Ambulatory Visit: Payer: Self-pay

## 2021-03-13 ENCOUNTER — Encounter: Payer: Self-pay | Admitting: Internal Medicine

## 2021-03-13 ENCOUNTER — Encounter: Payer: Self-pay | Admitting: Neurology

## 2021-03-13 VITALS — BP 160/89 | HR 73 | Ht 70.0 in | Wt 160.0 lb

## 2021-03-13 DIAGNOSIS — I680 Cerebral amyloid angiopathy: Secondary | ICD-10-CM

## 2021-03-13 DIAGNOSIS — E538 Deficiency of other specified B group vitamins: Secondary | ICD-10-CM | POA: Diagnosis not present

## 2021-03-13 DIAGNOSIS — I480 Paroxysmal atrial fibrillation: Secondary | ICD-10-CM | POA: Diagnosis not present

## 2021-03-13 DIAGNOSIS — R7989 Other specified abnormal findings of blood chemistry: Secondary | ICD-10-CM | POA: Diagnosis not present

## 2021-03-13 DIAGNOSIS — R799 Abnormal finding of blood chemistry, unspecified: Secondary | ICD-10-CM | POA: Diagnosis not present

## 2021-03-13 DIAGNOSIS — F01B0 Vascular dementia, moderate, without behavioral disturbance, psychotic disturbance, mood disturbance, and anxiety: Secondary | ICD-10-CM | POA: Diagnosis not present

## 2021-03-13 DIAGNOSIS — I6340 Cerebral infarction due to embolism of unspecified cerebral artery: Secondary | ICD-10-CM | POA: Diagnosis not present

## 2021-03-13 MED ORDER — MEMANTINE HCL 10 MG PO TABS: 10.0000 mg | ORAL_TABLET | Freq: Two times a day (BID) | ORAL | 3 refills | Status: DC

## 2021-03-13 MED ORDER — ASPIRIN EC 81 MG PO TBEC: 81.0000 mg | DELAYED_RELEASE_TABLET | Freq: Every day | ORAL | 11 refills | Status: DC

## 2021-03-13 MED ORDER — MEMANTINE HCL 28 X 5 MG & 21 X 10 MG PO TABS: ORAL_TABLET | ORAL | 12 refills | Status: DC

## 2021-03-13 NOTE — Progress Notes (Signed)
[facility] [address] [place]. [place] [address] [phone]       OFFICE FOLLOW-UP NOTE  Mr. WAYLAND BAIK Date of Birth:  September 23, 1935 Medical Record Number:  [id number]   HPI: Mr. Kau is a 86 year old Caucasian male seen today for initial office visit following hospital admission for stroke in January 2023.  He is accompanied by his wife who provides most of the history.  History is also obtained from review of electronic medical records and opossum reviewed pertinent available imaging films in PACS. JONATHA GAGEN is a 86 y.o. male with PMH significant for BPH, CHF, hypertension, hyperlipidemia, atrial fibrillation not on anticoagulation due to prior history of intracranial bleed and cerebral amyloid angiopathy on imaging, history of migraines, who presents with episode of lightheadedness and passing out.  He reports that earlier yesterday he got up to look who is at the door and passed out. He reports feeling lightheaded for a couple hours prior to that. He hit his head on the hardwood floor and had a laceration which was repaired in the ED. No recent changes to his meds, does not eat a lot.  CTH demonstrated trace SAH along with a possible right maxillary sinus non displaced fracture. He had MRI Brain without contrast which was notable for a small acute stroke in the R parietal and frontal lobe.  MRI brain shows microhemorrhages on brain and echo suggestive of possible cerebral amyloid angiopathy.  CT angiogram of the head and neck showed no LVO.  60% stenosis was noted in the right subclavian artery origin.  2D echo showed ejection fraction 50 to 55% with mild left ventricular hypertrophy.  LDL cholesterol is 39 mg percent.  Hemoglobin A1c was 5.2.  Patient was on aspirin 81 mg which she was changed to 325 mg.  Patient is currently living at home with his wife.  He is had significant decline in his cognitive function and memory.  He gets confused and disoriented easily.   He and his wife live in independent retirement home.  Patient gets confused and last usually.  On Mini-Mental status exam today scored 18/30.  He has never been on medication like Aricept and Namenda.  Wife is willing to try Namenda after discussed risk-benefit. ROS:   14 system review of systems is positive for memory loss, confusion, disorientation, getting lost all other systems negative  PMH:  Past Medical History:  Diagnosis Date   Anxiety    Blood transfusion without reported diagnosis    BPH (benign prostatic hyperplasia)    Cancer (HCC)    basal cell CA removed   Cataract    removed with lens implants    CHF (congestive heart failure) (HCC)    Colitis, ischemic (HCC)    secondary to ebolism from afib 1/10   Constipation    GERD (gastroesophageal reflux disease)    Gout    HTN (hypertension)    Hyperlipidemia    Intracranial bleed ([place]) 04/10/2010   Right occipital hematoma with a left homonymous hemianopsia    Migraines    Paroxysmal atrial fibrillation ([place])    s/p PVI 04/09/09 and 10/17/10   Rosacea    Stroke ([place]) 04/08/2010   hemorragic, anticoagulation stopped at that time   Tuberculosis    s/p treatment 1964    Social History:  Social History   Socioeconomic History   Marital status: Married    Spouse name: Not on file   Number of children: Not on file   Years  of education: Not on file   Highest education level: Not on file  Occupational History   Occupation: retired  Tobacco Use   Smoking status: Never   Smokeless tobacco: Never  Vaping Use   Vaping Use: Never used  Substance and Sexual Activity   Alcohol use: No   Drug use: No   Sexual activity: Not Currently  Other Topics Concern   Not on file  Social History Narrative   Pt lives in [place].    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  He is married and lives at home with his wife.  He used to smoke but quit  smoking many years ago.  Does  get regular exercise.  No alcohol use.    Social Determinants of Health   Financial Resource Strain: Not on file  Food Insecurity: Not on file  Transportation Needs: Not on file  Physical Activity: Not on file  Stress: Not on file  Social Connections: Not on file  Intimate Partner Violence: Not on file    Medications:   Current Outpatient Medications on File Prior to Visit  Medication Sig Dispense Refill   acetaminophen (TYLENOL) 325 MG tablet Take 650 mg by mouth every 6 (six) hours as needed for mild pain.     allopurinol (ZYLOPRIM) 100 MG tablet Take 100 mg by mouth at bedtime.       Ascorbic Acid (VITAMIN C) 1000 MG tablet Take 1,000 mg by mouth daily.      cycloSPORINE (RESTASIS) 0.05 % ophthalmic emulsion Place 1 drop into both eyes 2 (two) times daily as needed (dry eyes, irritation).     diclofenac Sodium (VOLTAREN) 1 % GEL Apply 2-4 g topically 4 (four) times daily. (Patient taking differently: Apply 2-4 g topically 4 (four) times daily as needed (arthritis pain).) 200 g 3   diltiazem (CARDIZEM CD) 240 MG 24 hr capsule TAKE 1 CAPSULE DAILY. PLEASE CALL OFFICE TO SCHEDULE APPOINTMENT FOR FURTHER REFILLS (Patient taking differently: Take 240 mg by mouth daily.) 15 capsule 0   docusate sodium (COLACE) 100 MG capsule Take 100 mg by mouth daily as needed for mild constipation.     donepezil (ARICEPT) 5 MG tablet Take 5 mg by mouth at bedtime.     famotidine (PEPCID) 20 MG tablet Take 20 mg by mouth at bedtime as needed for heartburn or indigestion.     hydroxyurea (HYDREA) 500 MG capsule TAKE 1 CAPSULE (500 MG TOTAL) BY MOUTH DAILY. MAY TAKE WITH FOOD TO MINIMIZE GI SIDE EFFECTS. 90 capsule 1   LORazepam (ATIVAN) 1 MG tablet Take 0.5-1 tablets (0.5-1 mg total) by mouth 2 (two) times daily as needed for anxiety. 25 tablet 0   losartan (COZAAR) 25 MG tablet TAKE 1 TABLET DAILY. PLEASE MAKE YEARLY APPT WITH DR. ALLRED FOR OCTOBER 2022 FOR FUTURE REFILLS (Patient taking differently: Take  25 mg by mouth daily.) 90 tablet 3   methocarbamol (ROBAXIN) 500 MG tablet Take 1 tablet (500 mg total) by mouth every 8 (eight) hours as needed for muscle spasms. 60 tablet 2   Olopatadine HCl 0.2 % SOLN Place 1 drop into both eyes in the morning and at bedtime.     pantoprazole (PROTONIX) 40 MG tablet Take 1 tablet (40 mg total) by mouth daily. 90 tablet 3   rosuvastatin (CRESTOR) 10 MG tablet Take 10 mg by mouth daily.  2   No current facility-administered medications on file prior  to visit.    Allergies:   Allergies  Allergen Reactions   Ace Inhibitors Cough   Warfarin And Related Other (See Comments)    Intercranial bleed    Physical Exam General: well developed, well nourished elderly Caucasian male, seated, in no evident distress Head: head normocephalic and atraumatic.  Neck: supple with no carotid or supraclavicular bruits Cardiovascular: regular rate and rhythm, no murmurs Musculoskeletal: no deformity Skin:  no rash/petichiae Vascular:  Normal pulses all extremities Vitals:   03/13/21 0848  BP: (!) 160/89  Pulse: 73   Neurologic Exam Mental Status: Awake and fully alert. Oriented to place and time. Recent and remote memory poor. Attention span, concentration and fund of knowledge appropriate recall 0/3.  Able to name only 4 animals that can walk on 4 legs.  Clock drawing 1/4.  Mini-Mental status exam scored 18/30. Mood and affect appropriate.  Cranial Nerves: Fundoscopic exam reveals sharp disc margins. Pupils equal, briskly reactive to light. Extraocular movements full without nystagmus. Visual fields full to confrontation. Hearing i diminished bilaterally ntact. Facial sensation intact. Face, tongue, palate moves normally and symmetrically.  Motor: Normal bulk and tone. Normal strength in all tested extremity muscles. Sensory.: intact to touch ,pinprick .position and vibratory sensation.  Coordination: Rapid alternating movements normal in all extremities.  Finger-to-nose and heel-to-shin performed accurately bilaterally. Gait and Station: Arises from chair without difficulty. Stance is normal. Gait demonstrates normal stride length and balance . Able to heel, toe and tandem walk with mild difficulty.  Reflexes: 1+ and symmetric. Toes downgoing.   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  He does have significant memory and cognitive decline likely from underlying dementia     PLAN:I had a long discussion with the patient and his wife regarding his atrial fibrillation and embolic strokes the fact that he is not a good long-term anticoagulation candidate with history of intracerebral hemorrhage in 2012 on warfarin abnormal MRI scan findings suggesting cerebral amyloid angiopathy.  He also has moderate dementia. as well.  I recommend he reduce the dose of aspirin to 81 mg only to minimize brain bleeding risk.  Continue Aricept and the current dose for his dementia but add Namenda starter pack and if tolerated then 10 mg twice daily.  Check dementia panel labs and EEG.  Encourage participation in cognitively challenging activities like solving crossword puzzles, playing bridge and sudoku.  Return for follow-up in 3 months or call earlier if necessary. Greater than 50% of time during this 42 minute visit was spent on counseling,explanation of diagnosis, planning of further management,  discussion with patient and family and coordination of care Antony Contras, MD Note: This document was prepared with digital dictation and possible smart phrase technology. Any transcriptional errors that result from this process are unintentional

## 2021-03-13 NOTE — Patient Instructions (Addendum)
I had a long discussion with the patient and his wife regarding his atrial fibrillation and embolic strokes the fact that he is not a good long-term anticoagulation candidate with history of intracerebral hemorrhage in 2012 on warfarin abnormal MRI scan findings suggesting cerebral amyloid angiopathy.  He also has moderate dementia. as well.  I recommend he reduce the dose of aspirin to 81 mg only to minimize brain bleeding risk.  Continue Aricept and the current dose for his dementia but add Namenda starter pack and if tolerated then 10 mg twice daily.  Check dementia panel labs and EEG.  Encourage participation in cognitively challenging activities like solving crossword puzzles, playing bridge and sudoku.  Return for follow-up in 3 months or call earlier if necessary. ?Memory Compensation Strategies ? ?Use "WARM" strategy. ? W= write it down ? A= associate it ? R= repeat it ? M= make a mental note ? ?2.   You can keep a Social worker. ? Use a 3-ring notebook with sections for the following: calendar, important names and phone numbers,  medications, doctors' names/phone numbers, lists/reminders, and a section to journal what you did  each day.  ? ?3.    Use a calendar to write appointments down. ? ?4.    Write yourself a schedule for the day. ? This can be placed on the calendar or in a separate section of the Memory Notebook.  Keeping a  regular schedule can help memory. ? ?5.    Use medication organizer with sections for each day or morning/evening pills. ? You may need help loading it ? ?6.    Keep a basket, or pegboard by the door. ? Place items that you need to take out with you in the basket or on the pegboard.  You may also want to  include a message board for reminders. ? ?7.    Use sticky notes. ? Place sticky notes with reminders in a place where the task is performed.  For example: " turn off the  stove" placed by the stove, "lock the door" placed on the door at eye level, " take your medications" on   the bathroom mirror or by the place where you normally take your medications. ? ?8.    Use alarms/timers. ? Use while cooking to remind yourself to check on food or as a reminder to take your medicine, or as a  reminder to make a call, or as a reminder to perform another task, etc. ? ?

## 2021-03-14 LAB — DEMENTIA PANEL
Homocysteine: 13.6 umol/L (ref 0.0–21.3)
RPR Ser Ql: NONREACTIVE
TSH: 1.91 u[IU]/mL (ref 0.450–4.500)
Vitamin B-12: 1068 pg/mL (ref 232–1245)

## 2021-03-14 NOTE — Progress Notes (Signed)
Kindly inform patient although so far available lab results for reversible causes of memory loss which are back satisfactory.  1 test result is not back yet.

## 2021-03-17 ENCOUNTER — Telehealth: Payer: Self-pay | Admitting: *Deleted

## 2021-03-17 NOTE — Telephone Encounter (Signed)
-----   Message from Garvin Fila, MD sent at 03/14/2021  3:41 PM EST ----- ?Kindly inform patient although so far available lab results for reversible causes of memory loss which are back satisfactory.  1 test result is not back yet. ?

## 2021-03-17 NOTE — Telephone Encounter (Signed)
After another failed attempt to call patient I called and spoke with patient's daughter, Lenna Sciara (as per DPR). Informed her of normal results. Let her know that one test was still pending and we would get back to her when the last test was available. Patient verbalized understanding and expressed appreciation for the call. ?

## 2021-03-17 NOTE — Telephone Encounter (Signed)
Attempted to call pt. Unable to reach VM. WCB. ?

## 2021-03-20 ENCOUNTER — Ambulatory Visit: Payer: HMO | Admitting: Neurology

## 2021-03-20 DIAGNOSIS — R410 Disorientation, unspecified: Secondary | ICD-10-CM

## 2021-03-27 DIAGNOSIS — Z125 Encounter for screening for malignant neoplasm of prostate: Secondary | ICD-10-CM | POA: Diagnosis not present

## 2021-03-27 DIAGNOSIS — E785 Hyperlipidemia, unspecified: Secondary | ICD-10-CM | POA: Diagnosis not present

## 2021-03-27 DIAGNOSIS — M109 Gout, unspecified: Secondary | ICD-10-CM | POA: Diagnosis not present

## 2021-03-27 DIAGNOSIS — E039 Hypothyroidism, unspecified: Secondary | ICD-10-CM | POA: Diagnosis not present

## 2021-03-27 DIAGNOSIS — I10 Essential (primary) hypertension: Secondary | ICD-10-CM | POA: Diagnosis not present

## 2021-04-01 DIAGNOSIS — M5416 Radiculopathy, lumbar region: Secondary | ICD-10-CM | POA: Diagnosis not present

## 2021-04-01 DIAGNOSIS — E785 Hyperlipidemia, unspecified: Secondary | ICD-10-CM | POA: Diagnosis not present

## 2021-04-01 DIAGNOSIS — J439 Emphysema, unspecified: Secondary | ICD-10-CM | POA: Diagnosis not present

## 2021-04-01 DIAGNOSIS — I7121 Aneurysm of the ascending aorta, without rupture: Secondary | ICD-10-CM | POA: Diagnosis not present

## 2021-04-01 DIAGNOSIS — D45 Polycythemia vera: Secondary | ICD-10-CM | POA: Diagnosis not present

## 2021-04-01 DIAGNOSIS — F03A0 Unspecified dementia, mild, without behavioral disturbance, psychotic disturbance, mood disturbance, and anxiety: Secondary | ICD-10-CM | POA: Diagnosis not present

## 2021-04-01 DIAGNOSIS — I6930 Unspecified sequelae of cerebral infarction: Secondary | ICD-10-CM | POA: Diagnosis not present

## 2021-04-01 DIAGNOSIS — Z0000 Encounter for general adult medical examination without abnormal findings: Secondary | ICD-10-CM | POA: Diagnosis not present

## 2021-04-01 DIAGNOSIS — N182 Chronic kidney disease, stage 2 (mild): Secondary | ICD-10-CM | POA: Diagnosis not present

## 2021-04-01 DIAGNOSIS — I480 Paroxysmal atrial fibrillation: Secondary | ICD-10-CM | POA: Diagnosis not present

## 2021-04-01 DIAGNOSIS — D692 Other nonthrombocytopenic purpura: Secondary | ICD-10-CM | POA: Diagnosis not present

## 2021-04-01 DIAGNOSIS — I129 Hypertensive chronic kidney disease with stage 1 through stage 4 chronic kidney disease, or unspecified chronic kidney disease: Secondary | ICD-10-CM | POA: Diagnosis not present

## 2021-04-06 ENCOUNTER — Other Ambulatory Visit: Payer: Self-pay | Admitting: Neurology

## 2021-04-23 ENCOUNTER — Encounter: Payer: Self-pay | Admitting: Internal Medicine

## 2021-04-30 ENCOUNTER — Emergency Department (HOSPITAL_COMMUNITY): Payer: PPO

## 2021-04-30 ENCOUNTER — Emergency Department (HOSPITAL_COMMUNITY)
Admission: EM | Admit: 2021-04-30 | Discharge: 2021-05-01 | Disposition: A | Discharge status: Home / Self Care | Payer: PPO | Attending: Emergency Medicine | Admitting: Emergency Medicine

## 2021-04-30 ENCOUNTER — Encounter: Payer: Self-pay | Admitting: Internal Medicine

## 2021-04-30 ENCOUNTER — Other Ambulatory Visit: Payer: Self-pay

## 2021-04-30 ENCOUNTER — Encounter (HOSPITAL_COMMUNITY): Payer: Self-pay | Admitting: Emergency Medicine

## 2021-04-30 DIAGNOSIS — R0602 Shortness of breath: Secondary | ICD-10-CM | POA: Diagnosis not present

## 2021-04-30 DIAGNOSIS — R55 Syncope and collapse: Secondary | ICD-10-CM | POA: Diagnosis not present

## 2021-04-30 DIAGNOSIS — Z743 Need for continuous supervision: Secondary | ICD-10-CM | POA: Diagnosis not present

## 2021-04-30 DIAGNOSIS — R42 Dizziness and giddiness: Secondary | ICD-10-CM | POA: Diagnosis not present

## 2021-04-30 DIAGNOSIS — Z7982 Long term (current) use of aspirin: Secondary | ICD-10-CM | POA: Insufficient documentation

## 2021-04-30 DIAGNOSIS — R410 Disorientation, unspecified: Secondary | ICD-10-CM

## 2021-04-30 DIAGNOSIS — I10 Essential (primary) hypertension: Secondary | ICD-10-CM | POA: Insufficient documentation

## 2021-04-30 DIAGNOSIS — Z7902 Long term (current) use of antithrombotics/antiplatelets: Secondary | ICD-10-CM | POA: Diagnosis not present

## 2021-04-30 DIAGNOSIS — Z79899 Other long term (current) drug therapy: Secondary | ICD-10-CM | POA: Insufficient documentation

## 2021-04-30 DIAGNOSIS — R001 Bradycardia, unspecified: Secondary | ICD-10-CM | POA: Diagnosis not present

## 2021-04-30 LAB — I-STAT CHEM 8, ED
BUN: 15 mg/dL (ref 8–23)
Calcium, Ion: 1.12 mmol/L — ABNORMAL LOW (ref 1.15–1.40)
Chloride: 110 mmol/L (ref 98–111)
Creatinine, Ser: 1.8 mg/dL — ABNORMAL HIGH (ref 0.61–1.24)
Glucose, Bld: 90 mg/dL (ref 70–99)
HCT: 45 % (ref 39.0–52.0)
Hemoglobin: 15.3 g/dL (ref 13.0–17.0)
Potassium: 4 mmol/L (ref 3.5–5.1)
Sodium: 142 mmol/L (ref 135–145)
TCO2: 24 mmol/L (ref 22–32)

## 2021-04-30 LAB — PROTIME-INR
INR: 1.1 (ref 0.8–1.2)
Prothrombin Time: 14.4 seconds (ref 11.4–15.2)

## 2021-04-30 LAB — CBC WITH DIFFERENTIAL/PLATELET
Abs Immature Granulocytes: 0.01 10*3/uL (ref 0.00–0.07)
Basophils Absolute: 0.1 10*3/uL (ref 0.0–0.1)
Basophils Relative: 1 %
Eosinophils Absolute: 0.2 10*3/uL (ref 0.0–0.5)
Eosinophils Relative: 2 %
HCT: 48.2 % (ref 39.0–52.0)
Hemoglobin: 14.7 g/dL (ref 13.0–17.0)
Immature Granulocytes: 0 %
Lymphocytes Relative: 17 %
Lymphs Abs: 1.3 10*3/uL (ref 0.7–4.0)
MCH: 26.8 pg (ref 26.0–34.0)
MCHC: 30.5 g/dL (ref 30.0–36.0)
MCV: 87.8 fL (ref 80.0–100.0)
Monocytes Absolute: 0.8 10*3/uL (ref 0.1–1.0)
Monocytes Relative: 11 %
Neutro Abs: 5.1 10*3/uL (ref 1.7–7.7)
Neutrophils Relative %: 69 %
Platelets: 282 10*3/uL (ref 150–400)
RBC: 5.49 MIL/uL (ref 4.22–5.81)
RDW: 15.7 % — ABNORMAL HIGH (ref 11.5–15.5)
WBC: 7.4 10*3/uL (ref 4.0–10.5)
nRBC: 0 % (ref 0.0–0.2)

## 2021-04-30 LAB — COMPREHENSIVE METABOLIC PANEL
ALT: 13 U/L (ref 0–44)
AST: 24 U/L (ref 15–41)
Albumin: 3.9 g/dL (ref 3.5–5.0)
Alkaline Phosphatase: 66 U/L (ref 38–126)
Anion gap: 6 (ref 5–15)
BUN: 13 mg/dL (ref 8–23)
CO2: 24 mmol/L (ref 22–32)
Calcium: 9.2 mg/dL (ref 8.9–10.3)
Chloride: 111 mmol/L (ref 98–111)
Creatinine, Ser: 1.17 mg/dL (ref 0.61–1.24)
GFR, Estimated: >60 mL/min (ref >60)
Glucose, Bld: 90 mg/dL (ref 70–99)
Potassium: 3.9 mmol/L (ref 3.5–5.1)
Sodium: 141 mmol/L (ref 135–145)
Total Bilirubin: 0.6 mg/dL (ref 0.3–1.2)
Total Protein: 6.3 g/dL — ABNORMAL LOW (ref 6.5–8.1)

## 2021-04-30 LAB — URINALYSIS, ROUTINE W REFLEX MICROSCOPIC
Bilirubin Urine: NEGATIVE
Glucose, UA: NEGATIVE mg/dL
Hgb urine dipstick: NEGATIVE
Ketones, ur: NEGATIVE mg/dL
Leukocytes,Ua: NEGATIVE
Nitrite: NEGATIVE
Protein, ur: NEGATIVE mg/dL
Specific Gravity, Urine: 1.01 (ref 1.005–1.030)
pH: 5 (ref 5.0–8.0)

## 2021-04-30 LAB — RAPID URINE DRUG SCREEN, HOSP PERFORMED
Amphetamines: NOT DETECTED
Barbiturates: NOT DETECTED
Benzodiazepines: NOT DETECTED
Cocaine: NOT DETECTED
Opiates: NOT DETECTED
Tetrahydrocannabinol: NOT DETECTED

## 2021-04-30 LAB — TROPONIN I (HIGH SENSITIVITY)
Troponin I (High Sensitivity): 9 ng/L (ref <18)
Troponin I (High Sensitivity): 10 ng/L (ref <18)

## 2021-04-30 LAB — APTT: aPTT: 34 seconds (ref 24–36)

## 2021-04-30 NOTE — Plan of Care (Signed)
Brief discussion with Dr. Wyvonnia Pratt over phone. ? ?Briefly, [name] is a 86 y.o. male with known Afibb and prior L PCA stroke but not on [facility] due to prior hemorrhage on warfarin and imaging findings concerning for cerebral amyloid angiopathy. He recently also had traumatic SAH on imaging in Jan 2023. On review of neurology notes, also seems like he has dementia and is on aricept and recently namenda. He presents today after a episode of unable to recognize his grandkids when looking at the picture along with dizziness and spinning. He is in afibb here but is rate controlled. The episode lasted a few mins. He was able to communicate fine during and after the episode and no concern for aphasia. ? ?Unclear what this episode truly was. Could be potential arhythmia, focal seizure. Unlikely to be TIA or TGA. Would expect TIA to last longer and TGA is a well defined phenomena which typically causes anterograde amnesia. Even if this was a stroke, with his high risk of bleeding including prior ICH, cerebral amyloid angiopathy, Aspirin would be the maximal medical therapy that we can safely offer. I do think that he would benefit from a routine EEG outpatient but the description of the episode itself is not consistent with a seizure. ? ?I would recommend outpatient neurology follow up and a routine EEG at this time. Plan discussed with Dr. Wyvonnia Pratt. Will get MRI Brain just for the sake of completeness of workup but understand that this is unlikely to change management even if there are strokes. His CTH is negative for ICH. ? ?Donnetta Simpers ?[facility] ?Pager Number [phone] ? ?

## 2021-04-30 NOTE — ED Provider Notes (Addendum)
?[place] ?Provider Note ? ? ?CSN: [id number] ?Arrival date & time: 04/30/21  1559 ? ?  ? ?History ? ?No chief complaint on file. ? ? ?Dean Pratt is a 86 y.o. male. ? ?Patient with a history of atrial fibrillation not on anticoagulation, hypertension, previous stroke presenting with episode of dizziness and difficulty recognizing family members and a picture.  States he was looking at a picture and did not recognize his grandchild who is seen many times before.  While this was happening he has room spinning dizziness and lightheadedness that lasted for about 5 minutes.  He became emotional because he could not remember the person but believes that he should be able to.  He states he had no trouble speaking and could not get his words out but is more of a memory problem speaking problem.  Did not have any weakness in his arms or his legs.  He did not have any headache.  Did not have any chest pain or shortness of breath.  Not had any visual changes.  No fever. ?Reports similar episodes intermittently over the past several weeks. ? ?The history is provided by the patient and the EMS personnel.  ? ?  ? ?Home Medications ?Prior to Admission medications   ?Medication Sig Start Date End Date Taking? Authorizing Provider  ?acetaminophen (TYLENOL) 325 MG tablet Take 650 mg by mouth every 6 (six) hours as needed for mild pain.    [provider]  ?allopurinol (ZYLOPRIM) 100 MG tablet Take 100 mg by mouth at bedtime.      [provider]  ?Ascorbic Acid (VITAMIN C) 1000 MG tablet Take 1,000 mg by mouth daily.     [provider]  ?aspirin EC 81 MG tablet Take 1 tablet (81 mg total) by mouth daily. Swallow whole. 03/13/21   Garvin Fila, MD  ?cycloSPORINE (RESTASIS) 0.05 % ophthalmic emulsion Place 1 drop into both eyes 2 (two) times daily as needed (dry eyes, irritation). 03/15/18   [provider]  ?diclofenac Sodium (VOLTAREN) 1 % GEL Apply 2-4  g topically 4 (four) times daily. ?Patient taking differently: Apply 2-4 g topically 4 (four) times daily as needed (arthritis pain). 09/15/19   Mcarthur Rossetti, MD  ?diltiazem (CARDIZEM CD) 240 MG 24 hr capsule TAKE 1 CAPSULE DAILY. PLEASE CALL OFFICE TO SCHEDULE APPOINTMENT FOR FURTHER REFILLS ?Patient taking differently: Take 240 mg by mouth daily. 05/25/19   Park Liter, MD  ?docusate sodium (COLACE) 100 MG capsule Take 100 mg by mouth daily as needed for mild constipation. 01/20/11   [provider]  ?donepezil (ARICEPT) 5 MG tablet Take 5 mg by mouth at bedtime. 06/12/19   [provider]  ?famotidine (PEPCID) 20 MG tablet Take 20 mg by mouth at bedtime as needed for heartburn or indigestion. 05/16/20   [provider]  ?hydroxyurea (HYDREA) 500 MG capsule TAKE 1 CAPSULE (500 MG TOTAL) BY MOUTH DAILY. MAY TAKE WITH FOOD TO MINIMIZE GI SIDE EFFECTS. 02/20/21   Curt Bears, MD  ?LORazepam (ATIVAN) 1 MG tablet Take 0.5-1 tablets (0.5-1 mg total) by mouth 2 (two) times daily as needed for anxiety. 01/14/21   Bonnielee Haff, MD  ?losartan (COZAAR) 25 MG tablet TAKE 1 TABLET DAILY. PLEASE MAKE YEARLY APPT WITH DR. ALLRED FOR OCTOBER 2022 FOR FUTURE REFILLS ?Patient taking differently: Take 25 mg by mouth daily. 12/19/20   Allred, Jeneen Rinks, MD  ?memantine [facility] TITRATION PAK) tablet pack 5 mg/day for =  1 week; 5 mg twice daily for =1 week; 15 mg/day given in 5 mg and 10 mg separated doses for =1 week; then 10 mg twice daily 03/13/21   Garvin Fila, MD  ?memantine (NAMENDA) 10 MG tablet Take 1 tablet (10 mg total) by mouth 2 (two) times daily. 04/08/21   Garvin Fila, MD  ?methocarbamol (ROBAXIN) 500 MG tablet Take 1 tablet (500 mg total) by mouth every 8 (eight) hours as needed for muscle spasms. 05/30/19   Magnus Sinning, MD  ?Olopatadine HCl 0.2 % SOLN Place 1 drop into both eyes in the morning and at bedtime. 07/10/18   [provider]  ?pantoprazole (PROTONIX) 40  MG tablet Take 1 tablet (40 mg total) by mouth daily. 09/02/20   Irene Shipper, MD  ?rosuvastatin (CRESTOR) 10 MG tablet Take 10 mg by mouth daily. 09/06/17   [provider]  ?   ? ?Allergies    ?Ace inhibitors and Warfarin and related   ? ?Review of Systems   ?Review of Systems  ?Constitutional:  Negative for activity change, appetite change, fatigue and fever.  ?HENT:  Negative for congestion.   ?Respiratory:  Negative for cough, chest tightness and shortness of breath.   ?Cardiovascular:  Negative for chest pain.  ?Gastrointestinal:  Negative for abdominal pain, nausea and vomiting.  ?Genitourinary:  Negative for dysuria and hematuria.  ?Musculoskeletal:  Negative for arthralgias and myalgias.  ?Skin:  Negative for rash.  ?Neurological:  Positive for dizziness, weakness and light-headedness.  ? all other systems are negative except as noted in the HPI and PMH.  ? ?Physical Exam ?Updated Vital Signs ?BP (!) 148/69   Pulse 71   Temp 97.6 ?F (36.4 ?C) (Oral)   Resp 18   Ht '5\' 10"'$  (1.778 m)   Wt 74.8 kg   SpO2 97%   BMI 23.68 kg/m?  ?Physical Exam ?Vitals and nursing note reviewed.  ?Constitutional:   ?   General: He is not in acute distress. ?   Appearance: He is well-developed.  ?HENT:  ?   Head: Normocephalic and atraumatic.  ?   Mouth/Throat:  ?   Pharynx: No oropharyngeal exudate.  ?Eyes:  ?   Conjunctiva/sclera: Conjunctivae normal.  ?   Pupils: Pupils are equal, round, and reactive to light.  ?Neck:  ?   Comments: No meningismus. ?Cardiovascular:  ?   Rate and Rhythm: Normal rate. Rhythm irregular.  ?   Heart sounds: Normal heart sounds. No murmur heard. ?Pulmonary:  ?   Effort: Pulmonary effort is normal. No respiratory distress.  ?   Breath sounds: Normal breath sounds.  ?Abdominal:  ?   Palpations: Abdomen is soft.  ?   Tenderness: There is no abdominal tenderness. There is no guarding or rebound.  ?Musculoskeletal:     ?   General: No tenderness. Normal range of motion.  ?   Cervical back:  Normal range of motion and neck supple.  ?Skin: ?   General: Skin is warm.  ?Neurological:  ?   Mental Status: He is alert and oriented to person, place, and time.  ?   Cranial Nerves: No cranial nerve deficit.  ?   Motor: No abnormal muscle tone.  ?   Coordination: Coordination normal.  ?   Comments: CN 2-12 intact, no ataxia on finger to nose, no nystagmus, 5/5 strength throughout, no pronator drift, Romberg negative, normal gait. ?  ?Psychiatric:     ?   Behavior: Behavior normal.  ? ? ?  ED Results / Procedures / Treatments   ?Labs ?(all labs ordered are listed, but only abnormal results are displayed) ?Labs Reviewed  ?CBC WITH DIFFERENTIAL/PLATELET - Abnormal; Notable for the following components:  ?    Result Value  ? RDW 15.7 (*)   ? All other components within normal limits  ?COMPREHENSIVE METABOLIC PANEL - Abnormal; Notable for the following components:  ? Total Protein 6.3 (*)   ? All other components within normal limits  ?I-STAT CHEM 8, ED - Abnormal; Notable for the following components:  ? Creatinine, Ser 1.80 (*)   ? Calcium, Ion 1.12 (*)   ? All other components within normal limits  ?PROTIME-INR  ?APTT  ?RAPID URINE DRUG SCREEN, HOSP PERFORMED  ?URINALYSIS, ROUTINE W REFLEX MICROSCOPIC  ?ETHANOL  ?TROPONIN I (HIGH SENSITIVITY)  ?TROPONIN I (HIGH SENSITIVITY)  ? ? ?EKG ?EKG Interpretation ? ?Date/Time:  Wednesday April 30 2021 16:17:50 EDT ?Ventricular Rate:  60 ?PR Interval:    ?QRS Duration: 102 ?QT Interval:  428 ?QTC Calculation: 428 ?R Axis:   52 ?Text Interpretation: Atrial fibrillation Anterior infarct, old No significant change was found Confirmed by Ezequiel Essex [phone]) on 04/30/2021 4:30:22 PM ? ?Radiology ?No results found. ? ?Procedures ?Procedures  ? ? ?Medications Ordered in ED ?Medications - No data to display ? ?ED Course/ Medical Decision Making/ A&P ?  ?                        ?Medical Decision Making ?Problems Addressed: ?Transient confusion: undiagnosed new problem with uncertain  prognosis ? ?Amount and/or Complexity of Data Reviewed ?Independent Historian: parent ?Labs: ordered. Decision-making details documented in ED Course. ?Radiology: ordered and independent interpretation perfor

## 2021-04-30 NOTE — Discharge Instructions (Signed)
There is no evidence of new stroke today.  Continue your aspirin.  Follow-up with your neurologist for EEG test to rule out seizures.  Do not drive or operate heavy machinery. ?Return to the ED with chest pain, shortness of breath, unilateral weakness, numbness, tingling, difficulty speaking or difficulty swallowing or any concerns. ?

## 2021-04-30 NOTE — ED Triage Notes (Signed)
Pt BIB [facility] from home  ?Pt was looking at pictures and had an issue remembering the name of the person in the photo, pt got upset at this and over 2-3 minutes confusions subsided but pt still feels dizzy. Pt in afib, hx of same, rate drops to 20, with non-perfusing PVCs. Hx stroke last year. A/o x4 on arrival to ED.  ?

## 2021-05-02 ENCOUNTER — Encounter: Payer: Self-pay | Admitting: Internal Medicine

## 2021-05-02 ENCOUNTER — Encounter: Payer: Self-pay | Admitting: Medical Oncology

## 2021-05-05 ENCOUNTER — Ambulatory Visit: Payer: HMO | Admitting: Neurology

## 2021-05-05 VITALS — BP 132/77 | HR 90 | Ht 70.0 in | Wt 165.0 lb

## 2021-05-05 DIAGNOSIS — F01A0 Vascular dementia, mild, without behavioral disturbance, psychotic disturbance, mood disturbance, and anxiety: Secondary | ICD-10-CM

## 2021-05-05 DIAGNOSIS — R410 Disorientation, unspecified: Secondary | ICD-10-CM | POA: Diagnosis not present

## 2021-05-05 DIAGNOSIS — I6930 Unspecified sequelae of cerebral infarction: Secondary | ICD-10-CM | POA: Diagnosis not present

## 2021-05-05 DIAGNOSIS — R413 Other amnesia: Secondary | ICD-10-CM | POA: Diagnosis not present

## 2021-05-05 DIAGNOSIS — I680 Cerebral amyloid angiopathy: Secondary | ICD-10-CM | POA: Diagnosis not present

## 2021-05-05 DIAGNOSIS — F03A0 Unspecified dementia, mild, without behavioral disturbance, psychotic disturbance, mood disturbance, and anxiety: Secondary | ICD-10-CM | POA: Diagnosis not present

## 2021-05-05 DIAGNOSIS — I129 Hypertensive chronic kidney disease with stage 1 through stage 4 chronic kidney disease, or unspecified chronic kidney disease: Secondary | ICD-10-CM | POA: Diagnosis not present

## 2021-05-05 DIAGNOSIS — I480 Paroxysmal atrial fibrillation: Secondary | ICD-10-CM | POA: Diagnosis not present

## 2021-05-05 DIAGNOSIS — S066X0A Traumatic subarachnoid hemorrhage without loss of consciousness, initial encounter: Secondary | ICD-10-CM | POA: Diagnosis not present

## 2021-05-05 MED ORDER — MEMANTINE HCL 10 MG PO TABS: 10.0000 mg | ORAL_TABLET | Freq: Two times a day (BID) | ORAL | 1 refills | Status: DC

## 2021-05-05 NOTE — [place] (Signed)
?[facility] ?[address] ?[place] [address] ?[phone] ? ?     OFFICE FOLLOW-UP NOTE ? ?[name] ?Date of Birth:  November 02, 1935 ?Medical Record Number:  [id number]  ? ?HPI: Initial visit 03/10/2021 ;[name] is a 86 year old Caucasian male seen today for initial office visit following hospital admission for stroke in January 2023.  He is accompanied by his wife who provides most of the history.  History is also obtained from review of electronic medical records and opossum reviewed pertinent available imaging films in PACS. ?Dean Pratt is a 86 y.o. male with PMH significant for BPH, CHF, hypertension, hyperlipidemia, atrial fibrillation not on anticoagulation due to prior history of intracranial bleed and cerebral amyloid angiopathy on imaging, history of migraines, who presents with episode of lightheadedness and passing out.  He reports that earlier yesterday he got up to look who is at the door and passed out. He reports feeling lightheaded for a couple hours prior to that. He hit his head on the hardwood floor and had a laceration which was repaired in the ED. No recent changes to his meds, does not eat a lot. ? CTH demonstrated trace SAH along with a possible right maxillary sinus non displaced fracture. He had MRI Brain without contrast which was notable for a small acute stroke in the R parietal and frontal lobe.  MRI brain shows microhemorrhages on brain and echo suggestive of possible cerebral amyloid angiopathy.  CT angiogram of the head and neck showed no LVO.  60% stenosis was noted in the right subclavian artery origin.  2D echo showed ejection fraction 50 to 55% with mild left ventricular hypertrophy.  LDL cholesterol is 39 mg percent.  Hemoglobin A1c was 5.2.  Patient was on aspirin 81 mg which she was changed to 325 mg.  Patient is currently living at home with his wife.  He is had significant decline in his cognitive function and memory.  He gets confused  and disoriented easily.  He and his wife live in independent retirement home.  Patient gets confused and last usually.  On Mini-Mental status exam today scored 18/30.  He has never been on medication like Aricept and Namenda.  Wife is willing to try Namenda after discussed risk-benefit. ?Update 05/05/2021 ; patient is seen for follow-up today after recent ER visit on 04/30/2021.  Patient on that day was looking at the picture and did not recognize his grandchild whom has seen many times.  He similarly could not remember the names of family members and he became quite anxious and frustrated because of this.  He had no trouble speaking and had no extremity weakness gait or balance problems.  His symptoms lasted for about an hour and recovered after he was in the ER.  Patient underwent MRI scan of the brain which showed no acute abnormality.  Patient's symptoms were felt to represent either a TIA versus transient global amnesia or unwitnessed seizure with postictal confusion.  Patient has done well since then and has had no further episodes.  At last visit with me I had lab work for reversible causes of memory loss which was all normal.  He was advised to follow-up with me in my clinic following this ER visit.  Continues to have mild memory and cognitive difficulties which appear to be unchanged as per his family.   His Mini-Mental status exam score today is 21/30 which is improved from 18/30 at last visit ? ?14 system review of systems  is positive for memory loss, confusion, disorientation, getting lost all other systems negative ? ?PMH:  ?Past Medical History:  ?Diagnosis Date  ? Anxiety   ? Blood transfusion without reported diagnosis   ? BPH (benign prostatic hyperplasia)   ? Cancer [facility])   ? basal cell CA removed  ? Cataract   ? removed with lens implants   ? CHF (congestive heart failure) ([place])   ? Colitis, ischemic ([place])   ? secondary to ebolism from afib 1/10  ? Constipation   ? GERD (gastroesophageal reflux  disease)   ? Gout   ? HTN (hypertension)   ? Hyperlipidemia   ? Intracranial bleed ([place]) 04/10/2010  ? Right occipital hematoma with a left homonymous hemianopsia   ? Migraines   ? Paroxysmal atrial fibrillation ([place])   ? s/p PVI 04/09/09 and 10/17/10  ? Rosacea   ? Stroke [facility]) 04/08/2010  ? hemorragic, anticoagulation stopped at that time  ? Tuberculosis   ? s/p treatment 1964  ? ? ?Social History:  ?Social History  ? ?Socioeconomic History  ? Marital status: Married  ?  Spouse name: Not on file  ? Number of children: Not on file  ? Years of education: Not on file  ? Highest education level: Not on file  ?Occupational History  ? Occupation: retired  ?Tobacco Use  ? Smoking status: Never  ? Smokeless tobacco: Never  ?Vaping Use  ? Vaping Use: Never used  ?Substance and Sexual Activity  ? Alcohol use: No  ? Drug use: No  ? Sexual activity: Not Currently  ?Other Topics Concern  ? Not on file  ?Social History Narrative  ? Pt lives in [place].    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  He is married and lives at home with his wife.  He used to smoke but quit  smoking many years ago.  Does get regular exercise.  No alcohol use.   ? ?Social Determinants of Health  ? ?Financial Resource Strain: Not on file  ?Food Insecurity: Not on file  ?Transportation Needs: Not on file  ?Physical Activity: Not on file  ?Stress: Not on file  ?Social Connections: Not on file  ?Intimate Partner Violence: Not on file  ? ? ?Medications:   ?Current Outpatient Medications on File Prior to Visit  ?Medication Sig Dispense Refill  ? acetaminophen (TYLENOL) 325 MG tablet Take 650 mg by mouth every 6 (six) hours as needed for mild pain.    ? allopurinol (ZYLOPRIM) 100 MG tablet Take 100 mg by mouth at bedtime.      ? Ascorbic Acid (VITAMIN C) 1000 MG tablet Take 1,000 mg by mouth daily.     ? aspirin EC 81 MG tablet Take 1 tablet (81 mg total) by mouth daily. Swallow whole. 30 tablet 11  ?  cycloSPORINE (RESTASIS) 0.05 % ophthalmic emulsion Place 1 drop into both eyes 2 (two) times daily as needed (dry eyes, irritation).    ? diclofenac Sodium (VOLTAREN) 1 % GEL Apply 2-4 g topically 4 (four) times daily. (Patient taking differently: Apply 2-4 g topically 4 (four) times daily as needed (arthritis pain).) 200 g 3  ? diltiazem (CARDIZEM CD) 240 MG 24 hr capsule TAKE 1 CAPSULE DAILY. PLEASE CALL OFFICE TO SCHEDULE APPOINTMENT FOR FURTHER REFILLS (Patient taking differently: Take 240 mg by mouth daily.) 15 capsule 0  ? docusate sodium (COLACE) 100 MG capsule Take 100 mg by mouth  daily as needed for mild constipation.    ? donepezil (ARICEPT) 5 MG tablet Take 5 mg by mouth at bedtime.    ? famotidine (PEPCID) 20 MG tablet Take 20 mg by mouth at bedtime as needed for heartburn or indigestion.    ? hydroxyurea (HYDREA) 500 MG capsule TAKE 1 CAPSULE (500 MG TOTAL) BY MOUTH DAILY. MAY TAKE WITH FOOD TO MINIMIZE GI SIDE EFFECTS. 90 capsule 1  ? LORazepam (ATIVAN) 1 MG tablet Take 0.5-1 tablets (0.5-1 mg total) by mouth 2 (two) times daily as needed for anxiety. 25 tablet 0  ? losartan (COZAAR) 25 MG tablet TAKE 1 TABLET DAILY. PLEASE MAKE YEARLY APPT WITH DR. ALLRED FOR OCTOBER 2022 FOR FUTURE REFILLS (Patient taking differently: Take 25 mg by mouth daily.) 90 tablet 3  ? memantine (NAMENDA TITRATION PAK) tablet pack 5 mg/day for =1 week; 5 mg twice daily for =1 week; 15 mg/day given in 5 mg and 10 mg separated doses for =1 week; then 10 mg twice daily (Patient not taking: Reported on 04/30/2021) 49 tablet 12  ? methocarbamol (ROBAXIN) 500 MG tablet Take 1 tablet (500 mg total) by mouth every 8 (eight) hours as needed for muscle spasms. 60 tablet 2  ? Olopatadine HCl 0.2 % SOLN Place 1 drop into both eyes in the morning and at bedtime.    ? OVER THE COUNTER MEDICATION Place 1 spray into both nostrils daily as needed (congestion). Congestion nasal spray    ? pantoprazole (PROTONIX) 40 MG tablet Take 1 tablet (40  mg total) by mouth daily. 90 tablet 3  ? rosuvastatin (CRESTOR) 10 MG tablet Take 10 mg by mouth daily.  2  ? vitamin B-12 (CYANOCOBALAMIN) 1000 MCG tablet Take 1,000 mcg by mouth daily.    ? ?No current facili

## 2021-05-05 NOTE — Patient Instructions (Addendum)
I had a long discussion with the patient and his wife regarding his recent episode of transient memory difficulties and recognizing family members which appears to be of unclear significance but has improved.  Recommend further evaluation with checking EEG.  Continue Aricept and Namenda and the current dosages.  Is given a refill for Namenda.  Continue aspirin for stroke prevention for his A-fib as he is too high risk for anticoagulation given his multiple cerebral microhemorrhages and history of hemorrhagic infarct.  Encourage participation in cognitively challenging activities like solving crossword puzzles, playing bridge and sudoku.  Return for follow-up in 3 months or call earlier if necessary. ?

## 2021-05-06 ENCOUNTER — Encounter: Payer: Self-pay | Admitting: Internal Medicine

## 2021-05-08 ENCOUNTER — Other Ambulatory Visit: Payer: HMO | Admitting: *Deleted

## 2021-05-14 ENCOUNTER — Telehealth: Payer: Self-pay | Admitting: Internal Medicine

## 2021-05-14 NOTE — Telephone Encounter (Signed)
.  Called patient to schedule appointment per 5.9 inbasket, patient is aware of date and time.   ?

## 2021-05-31 ENCOUNTER — Other Ambulatory Visit: Payer: Self-pay | Admitting: Internal Medicine

## 2021-06-04 ENCOUNTER — Encounter: Payer: Self-pay | Admitting: Internal Medicine

## 2021-06-04 ENCOUNTER — Other Ambulatory Visit: Payer: Self-pay

## 2021-06-04 ENCOUNTER — Inpatient Hospital Stay: Payer: PPO | Attending: Internal Medicine | Admitting: Internal Medicine

## 2021-06-04 ENCOUNTER — Inpatient Hospital Stay: Payer: PPO

## 2021-06-04 VITALS — BP 130/74 | HR 60 | Temp 98.6°F | Resp 16 | Ht 70.0 in | Wt 164.3 lb

## 2021-06-04 DIAGNOSIS — I509 Heart failure, unspecified: Secondary | ICD-10-CM | POA: Diagnosis not present

## 2021-06-04 DIAGNOSIS — D75839 Thrombocytosis, unspecified: Secondary | ICD-10-CM | POA: Diagnosis not present

## 2021-06-04 DIAGNOSIS — D473 Essential (hemorrhagic) thrombocythemia: Secondary | ICD-10-CM | POA: Insufficient documentation

## 2021-06-04 DIAGNOSIS — D45 Polycythemia vera: Secondary | ICD-10-CM

## 2021-06-04 DIAGNOSIS — I110 Hypertensive heart disease with heart failure: Secondary | ICD-10-CM | POA: Diagnosis not present

## 2021-06-04 DIAGNOSIS — R140 Abdominal distension (gaseous): Secondary | ICD-10-CM | POA: Diagnosis not present

## 2021-06-04 LAB — LACTATE DEHYDROGENASE: LDH: 123 U/L (ref 98–192)

## 2021-06-04 LAB — CBC WITH DIFFERENTIAL (CANCER CENTER ONLY)
Abs Immature Granulocytes: 0.03 10*3/uL (ref 0.00–0.07)
Basophils Absolute: 0.1 10*3/uL (ref 0.0–0.1)
Basophils Relative: 1 %
Eosinophils Absolute: 0.2 10*3/uL (ref 0.0–0.5)
Eosinophils Relative: 2 %
HCT: 43.4 % (ref 39.0–52.0)
Hemoglobin: 14.3 g/dL (ref 13.0–17.0)
Immature Granulocytes: 0 %
Lymphocytes Relative: 18 %
Lymphs Abs: 1.3 10*3/uL (ref 0.7–4.0)
MCH: 28.3 pg (ref 26.0–34.0)
MCHC: 32.9 g/dL (ref 30.0–36.0)
MCV: 85.8 fL (ref 80.0–100.0)
Monocytes Absolute: 0.6 10*3/uL (ref 0.1–1.0)
Monocytes Relative: 8 %
Neutro Abs: 5 10*3/uL (ref 1.7–7.7)
Neutrophils Relative %: 71 %
Platelet Count: 255 10*3/uL (ref 150–400)
RBC: 5.06 MIL/uL (ref 4.22–5.81)
RDW: 18 % — ABNORMAL HIGH (ref 11.5–15.5)
WBC Count: 7.2 10*3/uL (ref 4.0–10.5)
nRBC: 0 % (ref 0.0–0.2)

## 2021-06-04 LAB — CMP (CANCER CENTER ONLY)
ALT: 12 U/L (ref 0–44)
AST: 19 U/L (ref 15–41)
Albumin: 4 g/dL (ref 3.5–5.0)
Alkaline Phosphatase: 61 U/L (ref 38–126)
Anion gap: 7 (ref 5–15)
BUN: 17 mg/dL (ref 8–23)
CO2: 22 mmol/L (ref 22–32)
Calcium: 9.4 mg/dL (ref 8.9–10.3)
Chloride: 111 mmol/L (ref 98–111)
Creatinine: 1.13 mg/dL (ref 0.61–1.24)
GFR, Estimated: >60 mL/min (ref >60)
Glucose, Bld: 90 mg/dL (ref 70–99)
Potassium: 4.1 mmol/L (ref 3.5–5.1)
Sodium: 140 mmol/L (ref 135–145)
Total Bilirubin: 0.8 mg/dL (ref 0.3–1.2)
Total Protein: 6.9 g/dL (ref 6.5–8.1)

## 2021-06-04 NOTE — [place] (Signed)
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    PRIOR THERAPY: Phlebotomy on as-needed basis  CURRENT THERAPY: Hydroxyurea 500 mg p.o. daily.  INTERVAL HISTORY: Dean Pratt 86 y.o. male returns to the clinic today for follow-up visit accompanied by friend.  The patient is feeling fine today with no concerning complaints except for the memory loss after his stroke in January.  He denied having any current chest pain, shortness of breath, cough or hemoptysis.  He has no nausea, vomiting, diarrhea or constipation but his friend is concerned about his abdominal distention.  He has no headache or visual changes.  He denied having any fever or chills.  He is here today for evaluation and repeat blood work.    MEDICAL HISTORY: Past Medical History:  Diagnosis Date   Anxiety    Blood transfusion without reported diagnosis    BPH (benign prostatic hyperplasia)    Cancer ([facility])    basal cell CA removed   Cataract    removed with lens implants    CHF (congestive heart failure) ([place])    Colitis, ischemic ([facility])    secondary to ebolism from afib 1/10   Constipation    GERD (gastroesophageal reflux disease)    Gout    HTN (hypertension)    Hyperlipidemia    Intracranial bleed ([place]) 04/10/2010   Right occipital hematoma with a left homonymous hemianopsia    Migraines    Paroxysmal atrial fibrillation ([place])    s/p PVI 04/09/09 and 10/17/10   Rosacea    Stroke ([place]) 04/08/2010   hemorragic, anticoagulation stopped at that time   Tuberculosis    s/p treatment 1964    ALLERGIES:  is allergic to ace inhibitors and warfarin and related.  MEDICATIONS:  Current Outpatient Medications  Medication Sig Dispense Refill   acetaminophen (TYLENOL) 325 MG tablet Take 650 mg by mouth every 6 (six) hours as needed for mild pain.      allopurinol (ZYLOPRIM) 100 MG tablet Take 100 mg by mouth at bedtime.       Ascorbic Acid (VITAMIN C) 1000 MG tablet Take 1,000 mg by mouth daily.      aspirin EC 81 MG tablet Take 1 tablet (81 mg total) by mouth daily. Swallow whole. 30 tablet 11   cycloSPORINE (RESTASIS) 0.05 % ophthalmic emulsion Place 1 drop into both eyes 2 (two) times daily as needed (dry eyes, irritation).     diclofenac Sodium (VOLTAREN) 1 % GEL Apply 2-4 g topically 4 (four) times daily. (Patient taking differently: Apply 2-4 g topically 4 (four) times daily as needed (arthritis pain).) 200 g 3   diltiazem (CARDIZEM CD) 240 MG 24 hr capsule TAKE 1 CAPSULE DAILY. PLEASE CALL OFFICE TO SCHEDULE APPOINTMENT FOR FURTHER REFILLS (Patient taking differently: Take 240 mg by mouth daily.) 15 capsule 0   docusate sodium (COLACE) 100 MG capsule Take 100 mg by mouth daily as needed for mild constipation.     donepezil (ARICEPT) 5 MG tablet Take 5 mg by mouth at bedtime.     famotidine (PEPCID) 20 MG tablet Take 20 mg by mouth at bedtime as needed for heartburn or indigestion.     hydroxyurea (HYDREA) 500 MG capsule TAKE 1 CAPSULE (500 MG TOTAL) BY MOUTH DAILY. MAY TAKE WITH FOOD TO MINIMIZE GI SIDE EFFECTS. 90 capsule 1  LORazepam (ATIVAN) 1 MG tablet Take 0.5-1 tablets (0.5-1 mg total) by mouth 2 (two) times daily as needed for anxiety. 25 tablet 0   losartan (COZAAR) 25 MG tablet TAKE 1 TABLET DAILY. PLEASE MAKE YEARLY APPT WITH DR. ALLRED FOR OCTOBER 2022 FOR FUTURE REFILLS (Patient taking differently: Take 25 mg by mouth daily.) 90 tablet 3   memantine (NAMENDA TITRATION PAK) tablet pack 5 mg/day for =1 week; 5 mg twice daily for =1 week; 15 mg/day given in 5 mg and 10 mg separated doses for =1 week; then 10 mg twice daily (Patient not taking: Reported on 04/30/2021) 49 tablet 12   memantine (NAMENDA) 10 MG tablet Take 1 tablet (10 mg total) by mouth 2 (two) times daily. 180 tablet 1   methocarbamol (ROBAXIN) 500 MG tablet Take 1  tablet (500 mg total) by mouth every 8 (eight) hours as needed for muscle spasms. 60 tablet 2   Olopatadine HCl 0.2 % SOLN Place 1 drop into both eyes in the morning and at bedtime.     OVER THE COUNTER MEDICATION Place 1 spray into both nostrils daily as needed (congestion). Congestion nasal spray     pantoprazole (PROTONIX) 40 MG tablet Take 1 tablet (40 mg total) by mouth daily. 90 tablet 3   rosuvastatin (CRESTOR) 10 MG tablet Take 10 mg by mouth daily.  2   vitamin B-12 (CYANOCOBALAMIN) 1000 MCG tablet Take 1,000 mcg by mouth daily.     No current facility-administered medications for this visit.    SURGICAL HISTORY:  Past Surgical History:  Procedure Laterality Date   ablation  04/09/2009   s/p afib and atrial flutter ablation by [facility]   ABLATION OF DYSRHYTHMIC FOCUS  10/15/09   repeat afib ablation by [facility]   APPENDECTOMY     CATARACT EXTRACTION     CATARACT EXTRACTION, BILATERAL     with lens implants    COLONOSCOPY     CORNEAL LACERATION REPAIR     EYE SURGERY     INGUINAL HERNIA REPAIR     MASS EXCISION Left 12/20/2020   Procedure: EXCISION  SOFT TISSUE MASS LEFT FLANK;  Surgeon: Armandina Gemma, MD;  Location: [facility];  Service: General;  Laterality: Left;   POLYPECTOMY     TONSILLECTOMY     VASECTOMY      REVIEW OF SYSTEMS:  A comprehensive review of systems was negative except for: Constitutional: positive for fatigue Gastrointestinal: positive for abdominal distention Neurological: positive for memory problems   PHYSICAL EXAMINATION: General appearance: alert, cooperative, fatigued, and no distress Head: Normocephalic, without obvious abnormality, atraumatic Neck: no adenopathy, no JVD, supple, symmetrical, trachea midline, and thyroid not enlarged, symmetric, no tenderness/mass/nodules Lymph nodes: Cervical, supraclavicular, and axillary nodes normal. Resp: clear to auscultation bilaterally Back: symmetric, no curvature. ROM normal. No CVA  tenderness. Cardio: regular rate and rhythm, S1, S2 normal, no murmur, click, rub or gallop GI: soft, non-tender; bowel sounds normal; no masses,  no organomegaly Extremities: extremities normal, atraumatic, no cyanosis or edema  ECOG PERFORMANCE STATUS: 1 - Symptomatic but completely ambulatory  Blood pressure 130/74, pulse 60, temperature 98.6 F (37 C), temperature source Tympanic, resp. rate 16, height '5\' 10"'$  (1.778 m), weight 164 lb 4.8 oz (74.5 kg), SpO2 98 %.  LABORATORY DATA: Lab Results  Component Value Date   WBC 7.4 04/30/2021   HGB 15.3 04/30/2021   HCT 45.0 04/30/2021   MCV 87.8 04/30/2021   PLT 282 04/30/2021  Chemistry      Component Value Date/Time   NA 142 04/30/2021 1637   NA 143 10/18/2019 0836   K 4.0 04/30/2021 1637   CL 110 04/30/2021 1637   CO2 24 04/30/2021 1632   BUN 15 04/30/2021 1637   BUN 12 10/18/2019 0836   CREATININE 1.80 (H) 04/30/2021 1637   CREATININE 1.30 (H) 10/14/2020 1050      Component Value Date/Time   CALCIUM 9.2 04/30/2021 1632   ALKPHOS 66 04/30/2021 1632   AST 24 04/30/2021 1632   AST 22 10/14/2020 1050   ALT 13 04/30/2021 1632   ALT 16 10/14/2020 1050   BILITOT 0.6 04/30/2021 1632   BILITOT 1.0 10/14/2020 1050       RADIOGRAPHIC STUDIES: No results found.  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  The patient is also currently on hormonal therapy for androgen deficiency. He underwent phlebotomy on as-needed basis in the past.  He also had essential thrombocythemia. The patient is started treatment with hydroxyurea 500 mg p.o. daily and has been tolerating it fairly well. The patient has been tolerating his treatment with hydroxyurea fairly well with no concerning adverse effects. Repeat CBC today is unremarkable for any abnormalities in his hemoglobin and hematocrit are within the normal range.  He also has normal  platelets count. I recommended for the patient to continue his current treatment with hydroxyurea with the same dose. For the abdominal distention we will order ultrasound of the abdomen to evaluate for any hepatosplenomegaly. The patient will come back for follow-up visit in 3 months for evaluation with repeat blood work. He was advised to call immediately if he has any other concerning symptoms in the interval. The patient voices understanding of current disease status and treatment options and is in agreement with the current care plan. All questions were answered. The patient knows to call the clinic with any problems, questions or concerns. We can certainly see the patient much sooner if necessary.  Disclaimer: This note was dictated with voice recognition software. Similar sounding words can inadvertently be transcribed and may not be corrected upon review.

## 2021-06-23 ENCOUNTER — Telehealth: Payer: Self-pay | Admitting: Neurology

## 2021-06-23 NOTE — Telephone Encounter (Signed)
Unable to leave VM due to VM box not being set up, left mychart msg for reschedule needed for 8/21 - MD out

## 2021-06-25 ENCOUNTER — Encounter: Payer: Self-pay | Admitting: Internal Medicine

## 2021-06-26 ENCOUNTER — Telehealth: Payer: Self-pay | Admitting: Internal Medicine

## 2021-06-26 NOTE — Telephone Encounter (Signed)
Called patient regarding upcoming August appointments, patient has been called and notified. 

## 2021-08-01 ENCOUNTER — Telehealth: Payer: Self-pay | Admitting: Internal Medicine

## 2021-08-01 ENCOUNTER — Other Ambulatory Visit: Payer: Self-pay

## 2021-08-01 ENCOUNTER — Telehealth: Payer: Self-pay | Admitting: Neurology

## 2021-08-01 NOTE — Telephone Encounter (Signed)
*  STAT* If patient is at the pharmacy, call can be transferred to refill team.   1. Which medications need to be refilled? (please list name of each medication and dose if known) new prescription for  Losartan   2. Which pharmacy/location (including street and city if local pharmacy) is medication to be sent to?[facility]   3. Do they need a 30 day or 90 day supply? 90 days and refills

## 2021-08-01 NOTE — Telephone Encounter (Signed)
Pt c/o medication issue:  1. Name of Medication: Aspirin  2. How are you currently taking this medication (dosage and times per day)?   3. Are you having a reaction (difficulty breathing--STAT)?    4. What is your medication issue? Is patient supposed to be taking 81 mg or 325 mg?

## 2021-08-01 NOTE — Telephone Encounter (Signed)
Pt is requesting a refill for memantine (NAMENDA) 10 MG tablet.  Pharmacy: [facility]

## 2021-08-04 DIAGNOSIS — I129 Hypertensive chronic kidney disease with stage 1 through stage 4 chronic kidney disease, or unspecified chronic kidney disease: Secondary | ICD-10-CM | POA: Diagnosis not present

## 2021-08-04 DIAGNOSIS — I10 Essential (primary) hypertension: Secondary | ICD-10-CM | POA: Diagnosis not present

## 2021-08-04 DIAGNOSIS — E785 Hyperlipidemia, unspecified: Secondary | ICD-10-CM | POA: Diagnosis not present

## 2021-08-04 DIAGNOSIS — N182 Chronic kidney disease, stage 2 (mild): Secondary | ICD-10-CM | POA: Diagnosis not present

## 2021-08-04 MED ORDER — LOSARTAN POTASSIUM 25 MG PO TABS: 25.0000 mg | ORAL_TABLET | Freq: Every day | ORAL | 1 refills | Status: DC

## 2021-08-04 NOTE — Telephone Encounter (Signed)
Pt's pharmacy is requesting a refill on Aspirin. Would Dr. Rayann Heman like to refill this medication? Please address

## 2021-08-04 NOTE — Telephone Encounter (Signed)
I attempted to call the patient and was unable to get in touch with him. I called his daughter, Lenna Sciara (as per DPR). I left a vm stating his medication has refills on file and is available for pick up at [facility]. Our call back number was provided for any further questions.

## 2021-08-25 ENCOUNTER — Ambulatory Visit: Payer: HMO | Admitting: Neurology

## 2021-08-25 ENCOUNTER — Telehealth: Payer: Self-pay | Admitting: Neurology

## 2021-08-25 NOTE — Telephone Encounter (Signed)
I called pt's daughter back and left a vm.  If she calls back please discuss the worsening sx, he maybe able to see NP this week.

## 2021-08-25 NOTE — Telephone Encounter (Signed)
Pt's daughter has called back, phone rep was advised of the following: pt memory greatly declining, gait shakey, great decline, very weepy, does not remember certain family members. Please call daughter.

## 2021-08-25 NOTE — Telephone Encounter (Signed)
I spoke with Janett Billow, Np on this request.  Her recommendation if this an acute decline he should be seen in ED or with PCP to rule out other things first such as infection. If gradually progressive since prior visit, okay to see him.  I have called the daughter and left a vm asking for a call back to further discuss.

## 2021-08-25 NOTE — Telephone Encounter (Signed)
Pt daughter Lenna Sciara) is calling because Pt has a appointment scheduled for today but it was canceled  because Dr. Leonie Man is out today. Melissa stated Pt needed to be seen in the next couple days because he is having issues. Pt is requesting a nurse give her a call

## 2021-09-02 ENCOUNTER — Telehealth: Payer: Self-pay | Admitting: Neurology

## 2021-09-02 ENCOUNTER — Telehealth: Payer: Self-pay | Admitting: Internal Medicine

## 2021-09-02 ENCOUNTER — Inpatient Hospital Stay: Payer: PPO | Admitting: Internal Medicine

## 2021-09-02 ENCOUNTER — Inpatient Hospital Stay: Payer: PPO | Attending: Internal Medicine

## 2021-09-02 ENCOUNTER — Other Ambulatory Visit: Payer: Self-pay | Admitting: Internal Medicine

## 2021-09-02 ENCOUNTER — Telehealth: Payer: Self-pay

## 2021-09-02 MED ORDER — MEMANTINE HCL 10 MG PO TABS
10.0000 mg | ORAL_TABLET | Freq: Two times a day (BID) | ORAL | 0 refills | Status: DC
Start: 2021-09-02 — End: 2022-01-08

## 2021-09-02 MED ORDER — LOSARTAN POTASSIUM 25 MG PO TABS: 25.0000 mg | ORAL_TABLET | Freq: Every day | ORAL | 1 refills | Status: DC

## 2021-09-02 MED ORDER — HYDROXYUREA 500 MG PO CAPS: 500.0000 mg | ORAL_CAPSULE | Freq: Every day | ORAL | 0 refills | Status: DC

## 2021-09-02 NOTE — Telephone Encounter (Signed)
*  STAT* If patient is at the pharmacy, call can be transferred to refill team.   1. Which medications need to be refilled? (please list name of each medication and dose if known) losartan (COZAAR) 25 MG tablet  2. Which pharmacy/location (including street and city if local pharmacy) is medication to be sent to?  [facility]  3. Do they need a 30 day or 90 day supply? [place]

## 2021-09-02 NOTE — Telephone Encounter (Signed)
Pt is calling and requesting a refill on memantine (NAMENDA) 10 MG tablet. Please send prescription to [facility].

## 2021-09-02 NOTE — Telephone Encounter (Signed)
Rx sent to pharmacy   

## 2021-09-02 NOTE — Telephone Encounter (Signed)
Rx sent to pharmacy as requested.

## 2021-09-02 NOTE — Telephone Encounter (Signed)
[facility] called to request a refill of Hydrea to be sent to [facility].

## 2021-09-05 ENCOUNTER — Telehealth: Payer: Self-pay | Admitting: Internal Medicine

## 2021-09-05 NOTE — Telephone Encounter (Signed)
Called and spoke with [facility] regarding patient's appointments, scheduled per 09/01 scheduled message. Patient will be notified.

## 2021-09-08 ENCOUNTER — Encounter: Payer: Self-pay | Admitting: Internal Medicine

## 2021-09-11 NOTE — Progress Notes (Unsigned)
[facility] [address] [place]. [place] [address] [phone]       OFFICE FOLLOW-UP NOTE  Dean Pratt Date of Birth:  May 08, 1935 Medical Record Number:  [id number]    Primary neurologist: Dr. Leonie Man Reason for visit: Memory decline    No chief complaint on file.     HPI:  Update 09/15/2021 JM: Patient returns per request due to decline of memory.  He was previously seen by Dr. Leonie Man 4 months ago with reporting stable cognition.        History provided for reference purposes only Update 05/05/2021 ; patient is seen for follow-up today after recent ER visit on 04/30/2021.  Patient on that day was looking at the picture and did not recognize his grandchild whom has seen many times.  He similarly could not remember the names of family members and he became quite anxious and frustrated because of this.  He had no trouble speaking and had no extremity weakness gait or balance problems.  His symptoms lasted for about an hour and recovered after he was in the ER.  Patient underwent MRI scan of the brain which showed no acute abnormality.  Patient's symptoms were felt to represent either a TIA versus transient global amnesia or unwitnessed seizure with postictal confusion.  Patient has done well since then and has had no further episodes.  At last visit with me I had lab work for reversible causes of memory loss which was all normal.  He was advised to follow-up with me in my clinic following this ER visit.  Continues to have mild memory and cognitive difficulties which appear to be unchanged as per his family.   His Mini-Mental status exam score today is 21/30 which is improved from 18/30 at last visit    Initial visit 03/10/2021 ;Dean Pratt is a 86 year old Caucasian male seen today for initial office visit following hospital admission for stroke in January 2023.  He is accompanied by his wife who provides most of the history.  History is also obtained from review of  electronic medical records and opossum reviewed pertinent available imaging films in PACS. Dean Pratt is a 86 y.o. male with PMH significant for BPH, CHF, hypertension, hyperlipidemia, atrial fibrillation not on anticoagulation due to prior history of intracranial bleed and cerebral amyloid angiopathy on imaging, history of migraines, who presents with episode of lightheadedness and passing out.  He reports that earlier yesterday he got up to look who is at the door and passed out. He reports feeling lightheaded for a couple hours prior to that. He hit his head on the hardwood floor and had a laceration which was repaired in the ED. No recent changes to his meds, does not eat a lot.  CTH demonstrated trace SAH along with a possible right maxillary sinus non displaced fracture. He had MRI Brain without contrast which was notable for a small acute stroke in the R parietal and frontal lobe.  MRI brain shows microhemorrhages on brain and echo suggestive of possible cerebral amyloid angiopathy.  CT angiogram of the head and neck showed no LVO.  60% stenosis was noted in the right subclavian artery origin.  2D echo showed ejection fraction 50 to 55% with mild left ventricular hypertrophy.  LDL cholesterol is 39 mg percent.  Hemoglobin A1c was 5.2.  Patient was on aspirin 81 mg which she was changed to 325 mg.  Patient is currently living at home with his wife.  He is had significant  decline in his cognitive function and memory.  He gets confused and disoriented easily.  He and his wife live in independent retirement home.  Patient gets confused and last usually.  On Mini-Mental status exam today scored 18/30.  He has never been on medication like Aricept and Namenda.  Wife is willing to try Namenda after discussed risk-benefit.     14 system review of systems is positive for memory loss, confusion, disorientation, getting lost all other systems negative  PMH:  Past Medical History:  Diagnosis Date    Anxiety    Blood transfusion without reported diagnosis    BPH (benign prostatic hyperplasia)    Cancer (HCC)    basal cell CA removed   Cataract    removed with lens implants    CHF (congestive heart failure) (HCC)    Colitis, ischemic (HCC)    secondary to ebolism from afib 1/10   Constipation    GERD (gastroesophageal reflux disease)    Gout    HTN (hypertension)    Hyperlipidemia    Intracranial bleed ([place]) 04/10/2010   Right occipital hematoma with a left homonymous hemianopsia    Migraines    Paroxysmal atrial fibrillation (HCC)    s/p PVI 04/09/09 and 10/17/10   Rosacea    Stroke ([place]) 04/08/2010   hemorragic, anticoagulation stopped at that time   Tuberculosis    s/p treatment 1964    Social History:  Social History   Socioeconomic History   Marital status: Married    Spouse name: Not on file   Number of children: Not on file   Years of education: Not on file   Highest education level: Not on file  Occupational History   Occupation: retired  Tobacco Use   Smoking status: Never   Smokeless tobacco: Never  Vaping Use   Vaping Use: Never used  Substance and Sexual Activity   Alcohol use: No   Drug use: No   Sexual activity: Not Currently  Other Topics Concern   Not on file  Social History Narrative   Pt lives in [place].    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  He is married and lives at home with his wife.  He used to smoke but quit  smoking many years ago.  Does get regular exercise.  No alcohol use.    Social Determinants of Health   Financial Resource Strain: Not on file  Food Insecurity: Not on file  Transportation Needs: Not on file  Physical Activity: Not on file  Stress: Not on file  Social Connections: Not on file  Intimate Partner Violence: Not on file    Medications:   Current Outpatient Medications on File Prior to Visit  Medication Sig Dispense Refill   acetaminophen (TYLENOL)  325 MG tablet Take 650 mg by mouth every 6 (six) hours as needed for mild pain.     allopurinol (ZYLOPRIM) 100 MG tablet Take 100 mg by mouth at bedtime.       Ascorbic Acid (VITAMIN C) 1000 MG tablet Take 1,000 mg by mouth daily.      aspirin EC 81 MG tablet Take 1 tablet (81 mg total) by mouth daily. Swallow whole. 30 tablet 11   cycloSPORINE (RESTASIS) 0.05 % ophthalmic emulsion Place 1 drop into both eyes 2 (two) times daily as needed (dry eyes, irritation).     diclofenac Sodium (VOLTAREN) 1 % GEL Apply 2-4 g topically 4 (  four) times daily. (Patient taking differently: Apply 2-4 g topically 4 (four) times daily as needed (arthritis pain).) 200 g 3   diltiazem (CARDIZEM CD) 240 MG 24 hr capsule TAKE 1 CAPSULE DAILY. PLEASE CALL OFFICE TO SCHEDULE APPOINTMENT FOR FURTHER REFILLS (Patient taking differently: Take 240 mg by mouth daily.) 15 capsule 0   docusate sodium (COLACE) 100 MG capsule Take 100 mg by mouth daily as needed for mild constipation.     donepezil (ARICEPT) 5 MG tablet Take 5 mg by mouth at bedtime.     famotidine (PEPCID) 20 MG tablet Take 20 mg by mouth at bedtime as needed for heartburn or indigestion.     hydroxyurea (HYDREA) 500 MG capsule Take 1 capsule (500 mg total) by mouth daily. May take with food to minimize GI side effects. 30 capsule 0   LORazepam (ATIVAN) 1 MG tablet Take 0.5-1 tablets (0.5-1 mg total) by mouth 2 (two) times daily as needed for anxiety. 25 tablet 0   losartan (COZAAR) 25 MG tablet Take 1 tablet (25 mg total) by mouth daily. TAKE 1 TABLET DAILY. Strength: 25 mg 90 tablet 1   memantine (NAMENDA TITRATION PAK) tablet pack 5 mg/day for =1 week; 5 mg twice daily for =1 week; 15 mg/day given in 5 mg and 10 mg separated doses for =1 week; then 10 mg twice daily (Patient not taking: Reported on 04/30/2021) 49 tablet 12   memantine (NAMENDA) 10 MG tablet Take 1 tablet (10 mg total) by mouth 2 (two) times daily. 180 tablet 0   methocarbamol (ROBAXIN) 500 MG  tablet Take 1 tablet (500 mg total) by mouth every 8 (eight) hours as needed for muscle spasms. 60 tablet 2   Olopatadine HCl 0.2 % SOLN Place 1 drop into both eyes in the morning and at bedtime.     OVER THE COUNTER MEDICATION Place 1 spray into both nostrils daily as needed (congestion). Congestion nasal spray     pantoprazole (PROTONIX) 40 MG tablet Take 1 tablet (40 mg total) by mouth daily. 90 tablet 3   rosuvastatin (CRESTOR) 10 MG tablet Take 10 mg by mouth daily.  2   vitamin B-12 (CYANOCOBALAMIN) 1000 MCG tablet Take 1,000 mcg by mouth daily.     No current facility-administered medications on file prior to visit.    Allergies:   Allergies  Allergen Reactions   Ace Inhibitors Cough   Warfarin And Related Other (See Comments)    Intercranial bleed    Physical Exam General: well developed, well nourished elderly Caucasian male, seated, in no evident distress Head: head normocephalic and atraumatic.  Neck: supple with no carotid or supraclavicular bruits Cardiovascular: regular rate and rhythm, no murmurs Musculoskeletal: no deformity Skin:  no rash/petichiae Vascular:  Normal pulses all extremities There were no vitals filed for this visit.  Neurologic Exam Mental Status: Awake and fully alert. Oriented to place and time. Recent and remote memory poor. Attention span, concentration and fund of knowledge appropriate recall 1/3.  Able to name only 6 animals that can walk on 4 legs.  Clock drawing 1/4.  Mini-Mental status exam scored 21/30. Mood and affect appropriate.  Cranial Nerves: Fundoscopic exam reveals sharp disc margins. Pupils equal, briskly reactive to light. Extraocular movements full without nystagmus. Visual fields full to confrontation. Hearing i diminished bilaterally ntact. Facial sensation intact. Face, tongue, palate moves normally and symmetrically.  Motor: Normal bulk and tone. Normal strength in all tested extremity muscles. Sensory.: intact to touch  ,pinprick .position  and vibratory sensation.  Coordination: Rapid alternating movements normal in all extremities. Finger-to-nose and heel-to-shin performed accurately bilaterally. Gait and Station: Arises from chair without difficulty. Stance is normal. Gait demonstrates normal stride length and balance . Able to heel, toe and tandem walk with mild difficulty.  Reflexes: 1+ and symmetric. Toes downgoing.        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  He does have  memory and cognitive decline likely from underlying dementia which appears stable     PLAN:I had a long discussion with the patient and his wife regarding his recent episode of transient memory difficulties and recognizing family members which appears to be of unclear significance but has improved.  Recommend further evaluation with checking EEG.  Continue Aricept and Namenda and the current dosages.  Is given a refill for Namenda.  Continue aspirin for stroke prevention for his A-fib as he is too high risk for anticoagulation given his multiple cerebral microhemorrhages and history of hemorrhagic infarct.  Encourage participation in cognitively challenging activities like solving crossword puzzles, playing bridge and sudoku.  Return for follow-up in 3 months or call earlier if necessary..     I spent *** minutes of  face-to-face and non-face-to-face time with patient.  This included previsit chart review, lab review, study review, order entry, electronic health record documentation, patient education  Frann Rider, [facility]  [facility] [address] [place], [place] [address]  Phone [phone] Fax [phone] Note: This document was prepared with digital dictation and possible smart phrase technology. Any transcriptional errors that result from this process are unintentional.  CC:  [facility] provider: [name], MD

## 2021-09-15 ENCOUNTER — Encounter: Payer: Self-pay | Admitting: Adult Health

## 2021-09-15 ENCOUNTER — Ambulatory Visit (INDEPENDENT_AMBULATORY_CARE_PROVIDER_SITE_OTHER): Payer: PPO | Admitting: Adult Health

## 2021-09-15 VITALS — BP 122/74 | HR 75 | Ht 68.0 in | Wt 165.4 lb

## 2021-09-15 DIAGNOSIS — I6340 Cerebral infarction due to embolism of unspecified cerebral artery: Secondary | ICD-10-CM | POA: Diagnosis not present

## 2021-09-15 DIAGNOSIS — I680 Cerebral amyloid angiopathy: Secondary | ICD-10-CM

## 2021-09-15 DIAGNOSIS — F01A0 Vascular dementia, mild, without behavioral disturbance, psychotic disturbance, mood disturbance, and anxiety: Secondary | ICD-10-CM

## 2021-09-15 MED ORDER — DONEPEZIL HCL 10 MG PO TABS: 10.0000 mg | ORAL_TABLET | Freq: Every day | ORAL | 3 refills | Status: DC

## 2021-09-15 NOTE — Patient Instructions (Addendum)
Your Plan:  Please schedule EEG as previously recommended by Dr. Leonie Man upon leaving today  Increase Aricept to '10mg'$  nightly and continue Namenda '10mg'$  twice daily  Will complete some lab work today - will let you know if there are any concerning findings  Would recommend looking into support groups for dementia which can help you understand the progression of this disease and ways to work through frustrations and care for your loved one  Can contact [facility] support groups @ [phone] or @ [email or web address]      Follow up with Dr. Leonie Man in 6 months or call earlier if needed      Thank you for coming to see [place] at [facility]. I hope we have been able to provide you high quality care today.  You may receive a patient satisfaction survey over the next few weeks. We would appreciate your feedback and comments so that we may continue to improve ourselves and the health of our patients.

## 2021-09-22 ENCOUNTER — Ambulatory Visit (INDEPENDENT_AMBULATORY_CARE_PROVIDER_SITE_OTHER): Payer: PPO | Admitting: Neurology

## 2021-09-22 DIAGNOSIS — R4182 Altered mental status, unspecified: Secondary | ICD-10-CM

## 2021-09-22 DIAGNOSIS — R413 Other amnesia: Secondary | ICD-10-CM

## 2021-09-27 NOTE — Progress Notes (Signed)
Kindly inform the patient that EEG or brainwave study did not show any definite evidence of seizure activity.  There is mild slowing of the brainwave activity which can be seen in a variety of conditions.  Nothing to worry about

## 2021-10-01 ENCOUNTER — Inpatient Hospital Stay (HOSPITAL_BASED_OUTPATIENT_CLINIC_OR_DEPARTMENT_OTHER): Payer: PPO | Admitting: Internal Medicine

## 2021-10-01 ENCOUNTER — Other Ambulatory Visit: Payer: Self-pay

## 2021-10-01 ENCOUNTER — Telehealth: Payer: Self-pay | Admitting: Internal Medicine

## 2021-10-01 ENCOUNTER — Inpatient Hospital Stay: Payer: PPO | Attending: Internal Medicine

## 2021-10-01 VITALS — BP 142/73 | HR 55 | Temp 97.8°F | Resp 16 | Wt 165.3 lb

## 2021-10-01 DIAGNOSIS — D473 Essential (hemorrhagic) thrombocythemia: Secondary | ICD-10-CM | POA: Insufficient documentation

## 2021-10-01 DIAGNOSIS — F0394 Unspecified dementia, unspecified severity, with anxiety: Secondary | ICD-10-CM | POA: Insufficient documentation

## 2021-10-01 DIAGNOSIS — D45 Polycythemia vera: Secondary | ICD-10-CM

## 2021-10-01 DIAGNOSIS — I110 Hypertensive heart disease with heart failure: Secondary | ICD-10-CM | POA: Diagnosis not present

## 2021-10-01 DIAGNOSIS — I509 Heart failure, unspecified: Secondary | ICD-10-CM | POA: Insufficient documentation

## 2021-10-01 DIAGNOSIS — D75839 Thrombocytosis, unspecified: Secondary | ICD-10-CM | POA: Insufficient documentation

## 2021-10-01 LAB — CMP (CANCER CENTER ONLY)
ALT: 15 U/L (ref 0–44)
AST: 19 U/L (ref 15–41)
Albumin: 4.3 g/dL (ref 3.5–5.0)
Alkaline Phosphatase: 69 U/L (ref 38–126)
Anion gap: 7 (ref 5–15)
BUN: 16 mg/dL (ref 8–23)
CO2: 25 mmol/L (ref 22–32)
Calcium: 9.5 mg/dL (ref 8.9–10.3)
Chloride: 110 mmol/L (ref 98–111)
Creatinine: 1.27 mg/dL — ABNORMAL HIGH (ref 0.61–1.24)
GFR, Estimated: 56 mL/min — ABNORMAL LOW (ref >60)
Glucose, Bld: 101 mg/dL — ABNORMAL HIGH (ref 70–99)
Potassium: 3.7 mmol/L (ref 3.5–5.1)
Sodium: 142 mmol/L (ref 135–145)
Total Bilirubin: 0.9 mg/dL (ref 0.3–1.2)
Total Protein: 6.7 g/dL (ref 6.5–8.1)

## 2021-10-01 LAB — CBC WITH DIFFERENTIAL (CANCER CENTER ONLY)
Abs Immature Granulocytes: 0.02 10*3/uL (ref 0.00–0.07)
Basophils Absolute: 0.1 10*3/uL (ref 0.0–0.1)
Basophils Relative: 1 %
Eosinophils Absolute: 0.1 10*3/uL (ref 0.0–0.5)
Eosinophils Relative: 2 %
HCT: 42 % (ref 39.0–52.0)
Hemoglobin: 14.5 g/dL (ref 13.0–17.0)
Immature Granulocytes: 0 %
Lymphocytes Relative: 16 %
Lymphs Abs: 1.1 10*3/uL (ref 0.7–4.0)
MCH: 32.1 pg (ref 26.0–34.0)
MCHC: 34.5 g/dL (ref 30.0–36.0)
MCV: 92.9 fL (ref 80.0–100.0)
Monocytes Absolute: 0.5 10*3/uL (ref 0.1–1.0)
Monocytes Relative: 8 %
Neutro Abs: 4.9 10*3/uL (ref 1.7–7.7)
Neutrophils Relative %: 73 %
Platelet Count: 269 10*3/uL (ref 150–400)
RBC: 4.52 MIL/uL (ref 4.22–5.81)
RDW: 16.5 % — ABNORMAL HIGH (ref 11.5–15.5)
WBC Count: 6.7 10*3/uL (ref 4.0–10.5)
nRBC: 0 % (ref 0.0–0.2)

## 2021-10-01 LAB — LACTATE DEHYDROGENASE: LDH: 129 U/L (ref 98–192)

## 2021-10-01 NOTE — Progress Notes (Signed)
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    PRIOR THERAPY: Phlebotomy on as-needed basis  CURRENT THERAPY: Hydroxyurea 500 mg p.o. daily.  INTERVAL HISTORY: Dean Pratt 86 y.o. male returns to the clinic today for follow-up visit.  The patient is feeling fine today with no concerning complaints except for recent memory problems.  He is followed by neurology.  He denied having any current chest pain, shortness of breath, cough or hemoptysis.  He denied having any nausea, vomiting, diarrhea or constipation.  He has no headache or visual changes.  He has no recent weight loss or night sweats.  He continues to tolerate his treatment with hydroxyurea fairly well.  The patient is here today for evaluation and repeat blood work.    MEDICAL HISTORY: Past Medical History:  Diagnosis Date   Anxiety    Blood transfusion without reported diagnosis    BPH (benign prostatic hyperplasia)    Cancer (HCC)    basal cell CA removed   Cataract    removed with lens implants    CHF (congestive heart failure) ([place])    Colitis, ischemic (HCC)    secondary to ebolism from afib 1/10   Constipation    GERD (gastroesophageal reflux disease)    Gout    HTN (hypertension)    Hyperlipidemia    Intracranial bleed ([place]) 04/10/2010   Right occipital hematoma with a left homonymous hemianopsia    Migraines    Paroxysmal atrial fibrillation ([place])    s/p PVI 04/09/09 and 10/17/10   Rosacea    Stroke ([place]) 04/08/2010   hemorragic, anticoagulation stopped at that time   Tuberculosis    s/p treatment 1964    ALLERGIES:  is allergic to ace inhibitors and warfarin and related.  MEDICATIONS:  Current Outpatient Medications  Medication Sig Dispense Refill   acetaminophen (TYLENOL) 325 MG tablet Take 650 mg by mouth every 6 (six) hours  as needed for mild pain.     allopurinol (ZYLOPRIM) 100 MG tablet Take 100 mg by mouth at bedtime.       Ascorbic Acid (VITAMIN C) 1000 MG tablet Take 1,000 mg by mouth daily.      aspirin EC 81 MG tablet Take 1 tablet (81 mg total) by mouth daily. Swallow whole. 30 tablet 11   cycloSPORINE (RESTASIS) 0.05 % ophthalmic emulsion Place 1 drop into both eyes 2 (two) times daily as needed (dry eyes, irritation).     diclofenac Sodium (VOLTAREN) 1 % GEL Apply 2-4 g topically 4 (four) times daily. (Patient taking differently: Apply 2-4 g topically 4 (four) times daily as needed (arthritis pain).) 200 g 3   diltiazem (CARDIZEM CD) 240 MG 24 hr capsule TAKE 1 CAPSULE DAILY. PLEASE CALL OFFICE TO SCHEDULE APPOINTMENT FOR FURTHER REFILLS (Patient taking differently: Take 240 mg by mouth daily.) 15 capsule 0   docusate sodium (COLACE) 100 MG capsule Take 100 mg by mouth daily as needed for mild constipation.     donepezil (ARICEPT) 10 MG tablet Take 1 tablet (10 mg total) by mouth at bedtime. 90 tablet 3   famotidine (PEPCID) 20 MG tablet Take 20 mg by mouth at bedtime as needed for heartburn or indigestion.     hydroxyurea (HYDREA) 500 MG capsule Take 1 capsule (500 mg total) by mouth daily. May take with food  to minimize GI side effects. 30 capsule 0   LORazepam (ATIVAN) 1 MG tablet Take 0.5-1 tablets (0.5-1 mg total) by mouth 2 (two) times daily as needed for anxiety. 25 tablet 0   losartan (COZAAR) 25 MG tablet Take 1 tablet (25 mg total) by mouth daily. TAKE 1 TABLET DAILY. Strength: 25 mg 90 tablet 1   memantine (NAMENDA TITRATION PAK) tablet pack 5 mg/day for =1 week; 5 mg twice daily for =1 week; 15 mg/day given in 5 mg and 10 mg separated doses for =1 week; then 10 mg twice daily 49 tablet 12   memantine (NAMENDA) 10 MG tablet Take 1 tablet (10 mg total) by mouth 2 (two) times daily. 180 tablet 0   methocarbamol (ROBAXIN) 500 MG tablet Take 1 tablet (500 mg total) by mouth every 8 (eight) hours as  needed for muscle spasms. 60 tablet 2   Olopatadine HCl 0.2 % SOLN Place 1 drop into both eyes in the morning and at bedtime.     OVER THE COUNTER MEDICATION Place 1 spray into both nostrils daily as needed (congestion). Congestion nasal spray     pantoprazole (PROTONIX) 40 MG tablet Take 1 tablet (40 mg total) by mouth daily. 90 tablet 3   rosuvastatin (CRESTOR) 10 MG tablet Take 10 mg by mouth daily.  2   vitamin B-12 (CYANOCOBALAMIN) 1000 MCG tablet Take 1,000 mcg by mouth daily.     No current facility-administered medications for this visit.    SURGICAL HISTORY:  Past Surgical History:  Procedure Laterality Date   ablation  04/09/2009   s/p afib and atrial flutter ablation by JA   ABLATION OF DYSRHYTHMIC FOCUS  10/15/09   repeat afib ablation by JA   APPENDECTOMY     CATARACT EXTRACTION     CATARACT EXTRACTION, BILATERAL     with lens implants    COLONOSCOPY     CORNEAL LACERATION REPAIR     EYE SURGERY     INGUINAL HERNIA REPAIR     MASS EXCISION Left 12/20/2020   Procedure: EXCISION  SOFT TISSUE MASS LEFT FLANK;  Surgeon: Armandina Gemma, MD;  Location: [facility];  Service: General;  Laterality: Left;   POLYPECTOMY     TONSILLECTOMY     VASECTOMY      REVIEW OF SYSTEMS:  A comprehensive review of systems was negative except for: Neurological: positive for memory problems   PHYSICAL EXAMINATION: General appearance: alert, cooperative, and no distress Head: Normocephalic, without obvious abnormality, atraumatic Neck: no adenopathy, no JVD, supple, symmetrical, trachea midline, and thyroid not enlarged, symmetric, no tenderness/mass/nodules Lymph nodes: Cervical, supraclavicular, and axillary nodes normal. Resp: clear to auscultation bilaterally Back: symmetric, no curvature. ROM normal. No CVA tenderness. Cardio: regular rate and rhythm, S1, S2 normal, no murmur, click, rub or gallop GI: soft, non-tender; bowel sounds normal; no masses,  no  organomegaly Extremities: extremities normal, atraumatic, no cyanosis or edema  ECOG PERFORMANCE STATUS: 1 - Symptomatic but completely ambulatory  Blood pressure (!) 142/73, pulse (!) 55, temperature 97.8 F (36.6 C), temperature source Temporal, resp. rate 16, weight 165 lb 5 oz (75 kg), SpO2 97 %.  LABORATORY DATA: Lab Results  Component Value Date   WBC 6.7 10/01/2021   HGB 14.5 10/01/2021   HCT 42.0 10/01/2021   MCV 92.9 10/01/2021   PLT 269 10/01/2021      Chemistry      Component Value Date/Time   NA 140 06/04/2021 1318   NA 143  10/18/2019 0836   K 4.1 06/04/2021 1318   CL 111 06/04/2021 1318   CO2 22 06/04/2021 1318   BUN 17 06/04/2021 1318   BUN 12 10/18/2019 0836   CREATININE 1.13 06/04/2021 1318      Component Value Date/Time   CALCIUM 9.4 06/04/2021 1318   ALKPHOS 61 06/04/2021 1318   AST 19 06/04/2021 1318   ALT 12 06/04/2021 1318   BILITOT 0.8 06/04/2021 1318       RADIOGRAPHIC STUDIES: EEG adult  Result Date: 10-19-2021       [facility] [place]. [place] [address] [phone]      Electroencephalogram Procedure Note Mr. MACON SANDIFORD Date of Birth:  [birth year] Medical Record Number:  [id number] Indications: Diagnostic Date of Procedure 09/22/2021 Medications: none Clinical history : 6 year patient with memory loss being evaluated for seizures Technical Description This study was performed using 17 channel digital electroencephalographic recording equipment. International 10-20 electrode placement was used. The record was obtained with the patient awake, drowsy, and asleep.  The record is of fair technical quality for purposes of interpretation. Activation Procedures:  photic stimulation . EEG Description Awake: Alpha Activity: The waking state record contains a well-defined bi-occipital alpha rhythm of  low amplitude with a dominant frequency of 7-8 Hz. Reactivity is uncertain. No paroxsymal activity, spikes, or sharp waves are  noted.  Intermittent 6 to 7 Hz rhythmic symmetric theta range slowing is noted bilaterally in a diffuse fashion Technical component of study is adequate. EKG tracing shows regular sinus rhythm Length of this recording is 24 minutes and 2 seconds Sleep: With drowsiness, there is attenuation of the background alpha activity. As the patient enters into light sleep, vertex waves and symmetrical spindles are noted. K complexes are noted in sleep. Transition to the waking state is unremarkable. Result of Activation Procedures: Hyperventilation: N/A. Photo Stimulation: No photic driving response is noted. Summary This is a mildly abnormal EEG due to the presence of mild bihemispheric slowing which is a nonspecific finding seen in a variety of conditions including old age, degenerative, toxic, metabolic and hypoxic conditions.  No definite epileptiform activity was noted.    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  The patient is also currently on hormonal therapy for androgen deficiency. He underwent phlebotomy on as-needed basis in the past.  He also had essential thrombocythemia. The patient started treatment with hydroxyurea 500 mg p.o. daily and has been tolerating it fairly well. The patient has no concerning complaints today except for the baseline dementia. Repeat CBC showed a stable hemoglobin and hematocrit of 14.5 and 42.0% respectively. I recommended for the patient to continue his current treatment with hydroxyurea with the same dose. I will see him back for follow-up visit in 4 months for evaluation and repeat blood work. The patient was advised to call immediately if he has any concerning symptoms in the interval. The patient voices understanding of current disease status and treatment options and is in agreement with the current care plan. All questions were answered. The patient knows to call the  clinic with any problems, questions or concerns. We can certainly see the patient much sooner if necessary.  Disclaimer: This note was dictated with voice recognition software. Similar sounding words can inadvertently be transcribed and may not be corrected upon review.

## 2021-10-01 NOTE — Telephone Encounter (Signed)
Scheduled per 09/27 los, patient checked out in scheduling. Patient received updated calender and after visit summary, informed patient that his daughter will receive a notification on My-chart.

## 2021-10-14 DIAGNOSIS — I6930 Unspecified sequelae of cerebral infarction: Secondary | ICD-10-CM | POA: Diagnosis not present

## 2021-10-14 DIAGNOSIS — I129 Hypertensive chronic kidney disease with stage 1 through stage 4 chronic kidney disease, or unspecified chronic kidney disease: Secondary | ICD-10-CM | POA: Diagnosis not present

## 2021-10-14 DIAGNOSIS — N182 Chronic kidney disease, stage 2 (mild): Secondary | ICD-10-CM | POA: Diagnosis not present

## 2021-10-14 DIAGNOSIS — F419 Anxiety disorder, unspecified: Secondary | ICD-10-CM | POA: Diagnosis not present

## 2021-10-14 DIAGNOSIS — F03A0 Unspecified dementia, mild, without behavioral disturbance, psychotic disturbance, mood disturbance, and anxiety: Secondary | ICD-10-CM | POA: Diagnosis not present

## 2021-10-14 DIAGNOSIS — D751 Secondary polycythemia: Secondary | ICD-10-CM | POA: Diagnosis not present

## 2021-10-14 DIAGNOSIS — I7121 Aneurysm of the ascending aorta, without rupture: Secondary | ICD-10-CM | POA: Diagnosis not present

## 2021-10-14 DIAGNOSIS — I480 Paroxysmal atrial fibrillation: Secondary | ICD-10-CM | POA: Diagnosis not present

## 2021-10-14 DIAGNOSIS — E785 Hyperlipidemia, unspecified: Secondary | ICD-10-CM | POA: Diagnosis not present

## 2021-10-14 DIAGNOSIS — Z23 Encounter for immunization: Secondary | ICD-10-CM | POA: Diagnosis not present

## 2021-10-14 DIAGNOSIS — J439 Emphysema, unspecified: Secondary | ICD-10-CM | POA: Diagnosis not present

## 2021-10-14 DIAGNOSIS — S066X0A Traumatic subarachnoid hemorrhage without loss of consciousness, initial encounter: Secondary | ICD-10-CM | POA: Diagnosis not present

## 2021-10-14 DIAGNOSIS — D45 Polycythemia vera: Secondary | ICD-10-CM | POA: Diagnosis not present

## 2021-10-17 DIAGNOSIS — X32XXXD Exposure to sunlight, subsequent encounter: Secondary | ICD-10-CM | POA: Diagnosis not present

## 2021-10-17 DIAGNOSIS — L570 Actinic keratosis: Secondary | ICD-10-CM | POA: Diagnosis not present

## 2021-10-17 DIAGNOSIS — L821 Other seborrheic keratosis: Secondary | ICD-10-CM | POA: Diagnosis not present

## 2021-10-17 DIAGNOSIS — L820 Inflamed seborrheic keratosis: Secondary | ICD-10-CM | POA: Diagnosis not present

## 2021-10-21 ENCOUNTER — Other Ambulatory Visit: Payer: Self-pay | Admitting: Internal Medicine

## 2022-01-07 ENCOUNTER — Other Ambulatory Visit: Payer: Self-pay | Admitting: Internal Medicine

## 2022-01-08 ENCOUNTER — Other Ambulatory Visit: Payer: Self-pay | Admitting: Neurology

## 2022-01-08 DIAGNOSIS — Z961 Presence of intraocular lens: Secondary | ICD-10-CM | POA: Diagnosis not present

## 2022-01-08 DIAGNOSIS — L719 Rosacea, unspecified: Secondary | ICD-10-CM | POA: Diagnosis not present

## 2022-01-08 DIAGNOSIS — H33011 Retinal detachment with single break, right eye: Secondary | ICD-10-CM | POA: Diagnosis not present

## 2022-01-08 DIAGNOSIS — H04123 Dry eye syndrome of bilateral lacrimal glands: Secondary | ICD-10-CM | POA: Diagnosis not present

## 2022-01-08 DIAGNOSIS — H1045 Other chronic allergic conjunctivitis: Secondary | ICD-10-CM | POA: Diagnosis not present

## 2022-01-08 NOTE — Telephone Encounter (Signed)
Rx refilled as per last office visit note.

## 2022-02-04 ENCOUNTER — Other Ambulatory Visit: Payer: Self-pay

## 2022-02-04 ENCOUNTER — Inpatient Hospital Stay (HOSPITAL_BASED_OUTPATIENT_CLINIC_OR_DEPARTMENT_OTHER): Payer: PPO | Admitting: Internal Medicine

## 2022-02-04 ENCOUNTER — Inpatient Hospital Stay: Payer: PPO | Attending: Internal Medicine

## 2022-02-04 VITALS — BP 126/74 | HR 62 | Temp 97.8°F | Resp 16 | Ht 68.0 in | Wt 163.1 lb

## 2022-02-04 DIAGNOSIS — D45 Polycythemia vera: Secondary | ICD-10-CM | POA: Diagnosis not present

## 2022-02-04 DIAGNOSIS — D75839 Thrombocytosis, unspecified: Secondary | ICD-10-CM | POA: Insufficient documentation

## 2022-02-04 DIAGNOSIS — D473 Essential (hemorrhagic) thrombocythemia: Secondary | ICD-10-CM | POA: Insufficient documentation

## 2022-02-04 DIAGNOSIS — I509 Heart failure, unspecified: Secondary | ICD-10-CM | POA: Insufficient documentation

## 2022-02-04 DIAGNOSIS — I110 Hypertensive heart disease with heart failure: Secondary | ICD-10-CM | POA: Insufficient documentation

## 2022-02-04 LAB — CBC WITH DIFFERENTIAL (CANCER CENTER ONLY)
Abs Immature Granulocytes: 0.02 10*3/uL (ref 0.00–0.07)
Basophils Absolute: 0.2 10*3/uL — ABNORMAL HIGH (ref 0.0–0.1)
Basophils Relative: 1 %
Eosinophils Absolute: 0.3 10*3/uL (ref 0.0–0.5)
Eosinophils Relative: 3 %
HCT: 51.1 % (ref 39.0–52.0)
Hemoglobin: 16.5 g/dL (ref 13.0–17.0)
Immature Granulocytes: 0 %
Lymphocytes Relative: 17 %
Lymphs Abs: 1.8 10*3/uL (ref 0.7–4.0)
MCH: 27 pg (ref 26.0–34.0)
MCHC: 32.3 g/dL (ref 30.0–36.0)
MCV: 83.6 fL (ref 80.0–100.0)
Monocytes Absolute: 0.8 10*3/uL (ref 0.1–1.0)
Monocytes Relative: 7 %
Neutro Abs: 7.6 10*3/uL (ref 1.7–7.7)
Neutrophils Relative %: 72 %
Platelet Count: 486 10*3/uL — ABNORMAL HIGH (ref 150–400)
RBC: 6.11 MIL/uL — ABNORMAL HIGH (ref 4.22–5.81)
RDW: 14.5 % (ref 11.5–15.5)
WBC Count: 10.7 10*3/uL — ABNORMAL HIGH (ref 4.0–10.5)
nRBC: 0 % (ref 0.0–0.2)

## 2022-02-04 LAB — CMP (CANCER CENTER ONLY)
ALT: 12 U/L (ref 0–44)
AST: 17 U/L (ref 15–41)
Albumin: 4.4 g/dL (ref 3.5–5.0)
Alkaline Phosphatase: 74 U/L (ref 38–126)
Anion gap: 8 (ref 5–15)
BUN: 21 mg/dL (ref 8–23)
CO2: 26 mmol/L (ref 22–32)
Calcium: 10 mg/dL (ref 8.9–10.3)
Chloride: 109 mmol/L (ref 98–111)
Creatinine: 1.32 mg/dL — ABNORMAL HIGH (ref 0.61–1.24)
GFR, Estimated: 53 mL/min — ABNORMAL LOW (ref >60)
Glucose, Bld: 90 mg/dL (ref 70–99)
Potassium: 4.5 mmol/L (ref 3.5–5.1)
Sodium: 143 mmol/L (ref 135–145)
Total Bilirubin: 0.6 mg/dL (ref 0.3–1.2)
Total Protein: 7.1 g/dL (ref 6.5–8.1)

## 2022-02-04 LAB — LACTATE DEHYDROGENASE: LDH: 137 U/L (ref 98–192)

## 2022-02-04 NOTE — Progress Notes (Signed)
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    PRIOR THERAPY: Phlebotomy on as-needed basis  CURRENT THERAPY: Hydroxyurea 500 mg p.o. daily.  INTERVAL HISTORY: Dean Pratt 87 y.o. male returns to the clinic today for follow-up visit.  The patient is feeling fine today with no concerning complaints except for some postnasal drainage.  He denied having any fatigue or weakness.  He has no nausea, vomiting, diarrhea or constipation.  He has no headache or visual changes.  He denied having any significant weight loss or night sweats.  He has no chest pain, shortness of breath, cough or hemoptysis.  He has been tolerating his treatment with hydroxyurea fairly well.  The patient is here today for evaluation and repeat blood work.    MEDICAL HISTORY: Past Medical History:  Diagnosis Date   Anxiety    Blood transfusion without reported diagnosis    BPH (benign prostatic hyperplasia)    Cancer (HCC)    basal cell CA removed   Cataract    removed with lens implants    CHF (congestive heart failure) ([place])    Colitis, ischemic (HCC)    secondary to ebolism from afib 1/10   Constipation    GERD (gastroesophageal reflux disease)    Gout    HTN (hypertension)    Hyperlipidemia    Intracranial bleed ([place]) 04/10/2010   Right occipital hematoma with a left homonymous hemianopsia    Migraines    Paroxysmal atrial fibrillation ([place])    s/p PVI 04/09/09 and 10/17/10   Rosacea    Stroke ([facility]) 04/08/2010   hemorragic, anticoagulation stopped at that time   Tuberculosis    s/p treatment 1964    ALLERGIES:  is allergic to ace inhibitors and warfarin and related.  MEDICATIONS:  Current Outpatient Medications  Medication Sig Dispense Refill   acetaminophen (TYLENOL) 325 MG tablet Take 650 mg by mouth every 6 (six) hours  as needed for mild pain.     allopurinol (ZYLOPRIM) 100 MG tablet Take 100 mg by mouth at bedtime.       Ascorbic Acid (VITAMIN C) 1000 MG tablet Take 1,000 mg by mouth daily.      aspirin EC 81 MG tablet Take 1 tablet (81 mg total) by mouth daily. Swallow whole. 30 tablet 11   cycloSPORINE (RESTASIS) 0.05 % ophthalmic emulsion Place 1 drop into both eyes 2 (two) times daily as needed (dry eyes, irritation).     diclofenac Sodium (VOLTAREN) 1 % GEL Apply 2-4 g topically 4 (four) times daily. (Patient taking differently: Apply 2-4 g topically 4 (four) times daily as needed (arthritis pain).) 200 g 3   diltiazem (CARDIZEM CD) 240 MG 24 hr capsule TAKE 1 CAPSULE DAILY. PLEASE CALL OFFICE TO SCHEDULE APPOINTMENT FOR FURTHER REFILLS (Patient taking differently: Take 240 mg by mouth daily.) 15 capsule 0   docusate sodium (COLACE) 100 MG capsule Take 100 mg by mouth daily as needed for mild constipation.     donepezil (ARICEPT) 10 MG tablet Take 1 tablet (10 mg total) by mouth at bedtime. 90 tablet 3   famotidine (PEPCID) 20 MG tablet Take 20 mg by mouth at bedtime as needed for heartburn or indigestion.     hydroxyurea (HYDREA) 500 MG capsule TAKE ONE CAPSULE BY MOUTH AT NOON 30 capsule 2   LORazepam (  ATIVAN) 1 MG tablet Take 0.5-1 tablets (0.5-1 mg total) by mouth 2 (two) times daily as needed for anxiety. 25 tablet 0   losartan (COZAAR) 25 MG tablet Take 1 tablet (25 mg total) by mouth daily. TAKE 1 TABLET DAILY. Strength: 25 mg 90 tablet 1   memantine (NAMENDA) 10 MG tablet TAKE ONE TABLET BY MOUTH AT NOON and TAKE ONE TABLET BY MOUTH EVERYDAY AT BEDTIME 180 tablet 2   methocarbamol (ROBAXIN) 500 MG tablet Take 1 tablet (500 mg total) by mouth every 8 (eight) hours as needed for muscle spasms. 60 tablet 2   Olopatadine HCl 0.2 % SOLN Place 1 drop into both eyes in the morning and at bedtime.     OVER THE COUNTER MEDICATION Place 1 spray into both nostrils daily as needed (congestion). Congestion nasal  spray     pantoprazole (PROTONIX) 40 MG tablet Take 1 tablet (40 mg total) by mouth daily. 90 tablet 3   rosuvastatin (CRESTOR) 10 MG tablet Take 10 mg by mouth daily.  2   vitamin B-12 (CYANOCOBALAMIN) 1000 MCG tablet Take 1,000 mcg by mouth daily.     No current facility-administered medications for this visit.    SURGICAL HISTORY:  Past Surgical History:  Procedure Laterality Date   ablation  04/09/2009   s/p afib and atrial flutter ablation by JA   ABLATION OF DYSRHYTHMIC FOCUS  10/15/09   repeat afib ablation by JA   APPENDECTOMY     CATARACT EXTRACTION     CATARACT EXTRACTION, BILATERAL     with lens implants    COLONOSCOPY     CORNEAL LACERATION REPAIR     EYE SURGERY     INGUINAL HERNIA REPAIR     MASS EXCISION Left 12/20/2020   Procedure: EXCISION  SOFT TISSUE MASS LEFT FLANK;  Surgeon: Armandina Gemma, MD;  Location: [facility];  Service: General;  Laterality: Left;   POLYPECTOMY     TONSILLECTOMY     VASECTOMY      REVIEW OF SYSTEMS:  A comprehensive review of systems was negative except for: Ears, nose, mouth, throat, and face: positive for postnasal drainage Neurological: positive for memory problems   PHYSICAL EXAMINATION: General appearance: alert, cooperative, and no distress Head: Normocephalic, without obvious abnormality, atraumatic Neck: no adenopathy, no JVD, supple, symmetrical, trachea midline, and thyroid not enlarged, symmetric, no tenderness/mass/nodules Lymph nodes: Cervical, supraclavicular, and axillary nodes normal. Resp: clear to auscultation bilaterally Back: symmetric, no curvature. ROM normal. No CVA tenderness. Cardio: regular rate and rhythm, S1, S2 normal, no murmur, click, rub or gallop GI: soft, non-tender; bowel sounds normal; no masses,  no organomegaly Extremities: extremities normal, atraumatic, no cyanosis or edema  ECOG PERFORMANCE STATUS: 1 - Symptomatic but completely ambulatory  Blood pressure 126/74, pulse 62,  temperature 97.8 F (36.6 C), temperature source Temporal, resp. rate 16, height '5\' 8"'$  (1.727 m), weight 163 lb 1.6 oz (74 kg), SpO2 97 %.  LABORATORY DATA: Lab Results  Component Value Date   WBC 10.7 (H) 02/04/2022   HGB 16.5 02/04/2022   HCT 51.1 02/04/2022   MCV 83.6 02/04/2022   PLT 486 (H) 02/04/2022      Chemistry      Component Value Date/Time   NA 142 10/01/2021 0753   NA 143 10/18/2019 0836   K 3.7 10/01/2021 0753   CL 110 10/01/2021 0753   CO2 25 10/01/2021 0753   BUN 16 10/01/2021 0753   BUN 12 10/18/2019 0836  CREATININE 1.27 (H) 10/01/2021 0753      Component Value Date/Time   CALCIUM 9.5 10/01/2021 0753   ALKPHOS 69 10/01/2021 0753   AST 19 10/01/2021 0753   ALT 15 10/01/2021 0753   BILITOT 0.9 10/01/2021 0753       RADIOGRAPHIC STUDIES: No results found.  ASSESSMENT AND PLAN: This is a very pleasant 87 years old white male with recently diagnosed polycythemia vera with positive Jak 2 mutation.  The patient is also currently on hormonal therapy for androgen deficiency. He underwent phlebotomy on as-needed basis in the past.  He also had essential thrombocythemia. The patient started treatment with hydroxyurea 500 mg p.o. daily and has been tolerating it fairly well. The patient is doing fine today with no concerning complaints.  He is taking his hydroxyurea as recommended. Repeat CBC today showed a slightly elevated white blood count and platelet count but he has normal hemoglobin and hematocrit. I recommended for him to continue his current treatment with hydroxyurea 500 mg p.o. daily. I will see him back for follow-up visit in 3 months for evaluation with repeat blood work. He was advised to call immediately if he has any other concerning symptoms in the interval. The patient voices understanding of current disease status and treatment options and is in agreement with the current care plan. All questions were answered. The patient knows to call the  clinic with any problems, questions or concerns. We can certainly see the patient much sooner if necessary.  Disclaimer: This note was dictated with voice recognition software. Similar sounding words can inadvertently be transcribed and may not be corrected upon review.

## 2022-03-23 ENCOUNTER — Ambulatory Visit: Payer: PPO | Admitting: Neurology

## 2022-03-23 ENCOUNTER — Encounter: Payer: Self-pay | Admitting: Neurology

## 2022-03-23 VITALS — BP 138/82 | HR 54 | Ht 68.0 in | Wt 162.5 lb

## 2022-03-23 DIAGNOSIS — F0150 Vascular dementia without behavioral disturbance: Secondary | ICD-10-CM

## 2022-03-23 DIAGNOSIS — Z8673 Personal history of transient ischemic attack (TIA), and cerebral infarction without residual deficits: Secondary | ICD-10-CM | POA: Diagnosis not present

## 2022-03-23 NOTE — Progress Notes (Signed)
[facility] [address] [place]. [place] [address] [phone]       OFFICE FOLLOW-UP NOTE  Dean Pratt Date of Birth:  [birth year] Medical Record Number:  [id number]    Primary neurologist: Dr. Leonie Pratt Reason for visit: Memory decline    Chief Complaint  Patient presents with   Follow-up    Rm 15.       HPI: Update 03/23/2022 : He returns for follow-up after last visit with [facility] practitioner 6 months ago.  He is accompanied by his wife and son-in-law.  Patient feels that he is doing better after increasing the dose of Aricept to 10 mg daily.  He however has good days and bad days.  Gets tired more easily and tends spend a lot of time in sitting.  He does get disoriented and confused from time to time.  Patient is mostly independent in activities like dressing himself but he needs to changing clothes.  Patient is not allowed to drive and wife drives him around him.  Patient does do some sudoku with does not socialize a lot.  He denies any delusions or hallucinations but does tend to get anxious pretty easily particularly when faced with some changes.  Continues to have left-sided peripheral vision difficulties.He remains on aspirin for tolerating well without bruising or bleeding.  Tolerating Crestor well without side effects.  His blood pressure is well-controlled today it is 138/82.  He is tolerating Aricept 10 mg daily and Namenda 10 mg twice daily. Update 09/15/2021 Dean Pratt: Patient returns per request due to decline of memory accompanied by his wife and son-in-law.  He was previously seen by Dr. Leonie Pratt 4 months ago with reporting stable cognition.  Reports over the past 2 months, he has had gradual memory decline.  Can ask repeat questions, get confused easily and misplace items.  Denies any acute or abrupt worsening, no other associated neurological or stroke type symptoms. Son in law mentions that he has.  actively participating in social events at his  independent living facility and does note improvement of memory.  Can become frustrated at times due to memory loss but no significant behavioral concerns. Wife provides 24/7 supervision, suspect some caregiver burnout, voices frustration that she may have to repeat herself or tell him over and over again how to find a contact in his cell phone. Family does visit routinely.  MMSE today 21/30 (prior visit 21/30).  Remains on Aricept 5 mg nightly and Namenda 10 mg twice daily.  Compliant on aspirin and Crestor.  Closely followed by PCP Dr. Virgina Pratt.  No further concerns at this time.      History provided for reference purposes only Update 05/05/2021 ; patient is seen for follow-up today after recent ER visit on 04/30/2021.  Patient on that day was looking at the picture and did not recognize his grandchild whom has seen many times.  He similarly could not remember the names of family members and he became quite anxious and frustrated because of this.  He had no trouble speaking and had no extremity weakness gait or balance problems.  His symptoms lasted for about an hour and recovered after he was in the ER.  Patient underwent MRI scan of the brain which showed no acute abnormality.  Patient's symptoms were felt to represent either a TIA versus transient global amnesia or unwitnessed seizure with postictal confusion.  Patient has done well since then and has had no further episodes.  At last visit with me  I had lab work for reversible causes of memory loss which was all normal.  He was advised to follow-up with me in my clinic following this ER visit.  Continues to have mild memory and cognitive difficulties which appear to be unchanged as per his family.   His Mini-Mental status exam score today is 21/30 which is improved from 18/30 at last visit    Initial visit 03/10/2021 ;[name] is a 87 year old Caucasian male seen today for initial office visit following hospital admission for stroke in January 2023.  He  is accompanied by his wife who provides most of the history.  History is also obtained from review of electronic medical records and opossum reviewed pertinent available imaging films in PACS. Dean Pratt is a 87 y.o. male with PMH significant for BPH, CHF, hypertension, hyperlipidemia, atrial fibrillation not on anticoagulation due to prior history of intracranial bleed and cerebral amyloid angiopathy on imaging, history of migraines, who presents with episode of lightheadedness and passing out.  He reports that earlier yesterday he got up to look who is at the door and passed out. He reports feeling lightheaded for a couple hours prior to that. He hit his head on the hardwood floor and had a laceration which was repaired in the ED. No recent changes to his meds, does not eat a lot.  CTH demonstrated trace SAH along with a possible right maxillary sinus non displaced fracture. He had MRI Brain without contrast which was notable for a small acute stroke in the R parietal and frontal lobe.  MRI brain shows microhemorrhages on brain and echo suggestive of possible cerebral amyloid angiopathy.  CT angiogram of the head and neck showed no LVO.  60% stenosis was noted in the right subclavian artery origin.  2D echo showed ejection fraction 50 to 55% with mild left ventricular hypertrophy.  LDL cholesterol is 39 mg percent.  Hemoglobin A1c was 5.2.  Patient was on aspirin 81 mg which she was changed to 325 mg.  Patient is currently living at home with his wife.  He is had significant decline in his cognitive function and memory.  He gets confused and disoriented easily.  He and his wife live in independent retirement home.  Patient gets confused and last usually.  On Mini-Mental status exam today scored 18/30.  He has never been on medication like Aricept and Namenda.  Wife is willing to try Namenda after discussed risk-benefit.     14 system review of systems is positive for those listed in HPI and all other  systems negative  PMH:  Past Medical History:  Diagnosis Date   Anxiety    Blood transfusion without reported diagnosis    BPH (benign prostatic hyperplasia)    Cancer (HCC)    basal cell CA removed   Cataract    removed with lens implants    CHF (congestive heart failure) ([place])    Colitis, ischemic (HCC)    secondary to ebolism from afib 1/10   Constipation    GERD (gastroesophageal reflux disease)    Gout    HTN (hypertension)    Hyperlipidemia    Intracranial bleed ([place]) 04/10/2010   Right occipital hematoma with a left homonymous hemianopsia    Migraines    Paroxysmal atrial fibrillation ([place])    s/p PVI 04/09/09 and 10/17/10   Rosacea    Stroke ([place]) 04/08/2010   hemorragic, anticoagulation stopped at that time   Tuberculosis    s/p treatment 1964  Social History:  Social History   Socioeconomic History   Marital status: Married    Spouse name: Not on file   Number of children: Not on file   Years of education: Not on file   Highest education level: Not on file  Occupational History   Occupation: retired  Tobacco Use   Smoking status: Never   Smokeless tobacco: Never  Vaping Use   Vaping Use: Never used  Substance and Sexual Activity   Alcohol use: No   Drug use: No   Sexual activity: Not Currently  Other Topics Concern   Not on file  Social History Narrative   Pt lives in [place].    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  He is married and lives at home with his wife.  He used to smoke but quit  smoking many years ago.  Does get regular exercise.  No alcohol use.    Social Determinants of Health   Financial Resource Strain: Not on file  Food Insecurity: Not on file  Transportation Needs: Not on file  Physical Activity: Not on file  Stress: Not on file  Social Connections: Not on file  Intimate Partner Violence: Not on file    Medications:   Current Outpatient Medications on File Prior  to Visit  Medication Sig Dispense Refill   acetaminophen (TYLENOL) 325 MG tablet Take 650 mg by mouth every 6 (six) hours as needed for mild pain.     allopurinol (ZYLOPRIM) 100 MG tablet Take 100 mg by mouth at bedtime.       Ascorbic Acid (VITAMIN C) 1000 MG tablet Take 1,000 mg by mouth daily.      aspirin EC 81 MG tablet Take 1 tablet (81 mg total) by mouth daily. Swallow whole. 30 tablet 11   cycloSPORINE (RESTASIS) 0.05 % ophthalmic emulsion Place 1 drop into both eyes 2 (two) times daily as needed (dry eyes, irritation).     diclofenac Sodium (VOLTAREN) 1 % GEL Apply 2-4 g topically 4 (four) times daily. (Patient taking differently: Apply 2-4 g topically 4 (four) times daily as needed (arthritis pain).) 200 g 3   diltiazem (CARDIZEM CD) 240 MG 24 hr capsule TAKE 1 CAPSULE DAILY. PLEASE CALL OFFICE TO SCHEDULE APPOINTMENT FOR FURTHER REFILLS (Patient taking differently: Take 240 mg by mouth daily.) 15 capsule 0   docusate sodium (COLACE) 100 MG capsule Take 100 mg by mouth daily as needed for mild constipation.     donepezil (ARICEPT) 10 MG tablet Take 1 tablet (10 mg total) by mouth at bedtime. 90 tablet 3   famotidine (PEPCID) 20 MG tablet Take 20 mg by mouth at bedtime as needed for heartburn or indigestion.     hydroxyurea (HYDREA) 500 MG capsule TAKE ONE CAPSULE BY MOUTH AT NOON 30 capsule 2   LORazepam (ATIVAN) 1 MG tablet Take 0.5-1 tablets (0.5-1 mg total) by mouth 2 (two) times daily as needed for anxiety. 25 tablet 0   losartan (COZAAR) 25 MG tablet Take 1 tablet (25 mg total) by mouth daily. TAKE 1 TABLET DAILY. Strength: 25 mg 90 tablet 1   memantine (NAMENDA) 10 MG tablet TAKE ONE TABLET BY MOUTH AT NOON and TAKE ONE TABLET BY MOUTH EVERYDAY AT BEDTIME 180 tablet 2   methocarbamol (ROBAXIN) 500 MG tablet Take 1 tablet (500 mg total) by mouth every 8 (eight) hours as needed for muscle spasms. 60 tablet 2  Olopatadine HCl 0.2 % SOLN Place 1 drop into both eyes in the morning and at  bedtime.     OVER THE COUNTER MEDICATION Place 1 spray into both nostrils daily as needed (congestion). Congestion nasal spray     pantoprazole (PROTONIX) 40 MG tablet Take 1 tablet (40 mg total) by mouth daily. 90 tablet 3   rosuvastatin (CRESTOR) 10 MG tablet Take 10 mg by mouth daily.  2   vitamin B-12 (CYANOCOBALAMIN) 1000 MCG tablet Take 1,000 mcg by mouth daily.     No current facility-administered medications on file prior to visit.    Allergies:   Allergies  Allergen Reactions   Ace Inhibitors Cough   Warfarin And Related Other (See Comments)    Intercranial bleed    Physical Exam Today's Vitals   03/23/22 1308  BP: 138/82  Pulse: (!) 54  Weight: 162 lb 8 oz (73.7 kg)  Height: 5\' 8"  (1.727 m)   Body mass index is 24.71 kg/m.   General: well developed, well nourished very pleasant elderly Caucasian male, seated, in no evident distress Head: head normocephalic and atraumatic.  Neck: supple with no carotid or supraclavicular bruits Cardiovascular: irregular rate and rhythm, no murmurs Musculoskeletal: no deformity Skin:  no rash/petichiae Vascular:  Normal pulses all extremities  Neurologic Exam Mental Status: Awake and fully alert.  Fluent speech and language.  Oriented to place and time. Recent and remote memory poor. Attention span, concentration and fund of knowledge appropriate diminished recall.     03/23/2022    1:10 PM 09/15/2021    2:05 PM 05/05/2021   12:08 PM  MMSE - Mini Mental State Exam  Orientation to time 4 2 3   Orientation to Place 5 5 5   Registration 3 3 3   Attention/ Calculation 3 1 1   Recall 2 2 1   Language- name 2 objects 2 2 2   Language- repeat 1 1 1   Language- follow 3 step command 3 3 3   Language- read & follow direction 1 1 1   Write a sentence 1 1 1   Copy design 0 0 0  Total score 25 21 21    Cranial Nerves: Pupils equal, briskly reactive to light. Extraocular movements full without nystagmus. Visual fields full to confrontation.  Hearing i diminished bilaterally ntact. Facial sensation intact. Face, tongue, palate moves normally and symmetrically.  Motor: Normal bulk and tone. Normal strength in all tested extremity muscles. Sensory.: intact to touch ,pinprick .position and vibratory sensation.  Coordination: Rapid alternating movements normal in all extremities. Finger-to-nose and heel-to-shin performed accurately bilaterally. Gait and Station: Arises from chair without difficulty. Stance is normal. Gait demonstrates normal stride length and balance with use of cane.  Reflexes: 1+ and symmetric. Toes downgoing.       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   -I had a long discussion with patient and his wife regarding his mild dementia which appears stable on current medication regimen recommend he continue Aricept 10 mg daily and Namenda 10 mg twice daily.  I encouraged him to increase participation in cognitively challenging activities like solving crossword puzzles, playing bridge and sudoku.  We also discussed memory compensation strategies.  Stroke prevention and maintain  aggressive risk factor modification with strict control of hypertension with blood pressure goal below 140/90 with LDL cholesterol goal below 70 mg he will return for follow-up in the future in 6 months with Janett Billow nurse practitioner or call earlier if necessary.  Greater than 50% time during 40-minute visit were spent in counseling and coordination of care about his memory loss and dementia and stroke history and  Antony Contras, MD  [facility] [address]  [place], [place] [address]  Phone [phone] Fax [phone] Note: This document was prepared with digital dictation and possible smart phrase technology. Any transcriptional errors that result from this process are unintentional.  CC:  [facility] provider: [name], MD

## 2022-03-23 NOTE — Patient Instructions (Signed)
I had a long discussion with patient and his wife regarding his mild dementia which appears stable on current medication regimen recommend he continue Aricept 10 mg daily and Namenda 10 mg twice daily.  I encouraged him to increase participation in cognitively challenging activities like solving crossword puzzles, playing bridge and sudoku.  We also discussed memory compensation strategies.  Stroke prevention and maintain aggressive risk factor modification with strict control of hypertension with blood pressure goal below 140/90 with LDL cholesterol goal below 70 mg he will return for follow-up in the future in 6 months with Janett Billow nurse practitioner or call earlier if necessary.  Memory Compensation Strategies  Use "WARM" strategy.  W= write it down  A= associate it  R= repeat it  M= make a mental note  2.   You can keep a Social worker.  Use a 3-ring notebook with sections for the following: calendar, important names and phone numbers,  medications, doctors' names/phone numbers, lists/reminders, and a section to journal what you did  each day.   3.    Use a calendar to write appointments down.  4.    Write yourself a schedule for the day.  This can be placed on the calendar or in a separate section of the Memory Notebook.  Keeping a  regular schedule can help memory.  5.    Use medication organizer with sections for each day or morning/evening pills.  You may need help loading it  6.    Keep a basket, or pegboard by the door.  Place items that you need to take out with you in the basket or on the pegboard.  You may also want to  include a message board for reminders.  7.    Use sticky notes.  Place sticky notes with reminders in a place where the task is performed.  For example: " turn off the  stove" placed by the stove, "lock the door" placed on the door at eye level, " take your medications" on  the bathroom mirror or by the place where you normally take your medications.  8.     Use alarms/timers.  Use while cooking to remind yourself to check on food or as a reminder to take your medicine, or as a  reminder to make a call, or as a reminder to perform another task, etc.

## 2022-04-01 ENCOUNTER — Telehealth: Payer: Self-pay | Admitting: Internal Medicine

## 2022-04-01 NOTE — Telephone Encounter (Signed)
Called patient regarding April appointments, left a voicemail.  

## 2022-04-02 DIAGNOSIS — E291 Testicular hypofunction: Secondary | ICD-10-CM | POA: Diagnosis not present

## 2022-04-02 DIAGNOSIS — Z7689 Persons encountering health services in other specified circumstances: Secondary | ICD-10-CM | POA: Diagnosis not present

## 2022-04-02 DIAGNOSIS — E785 Hyperlipidemia, unspecified: Secondary | ICD-10-CM | POA: Diagnosis not present

## 2022-04-03 ENCOUNTER — Other Ambulatory Visit: Payer: Self-pay | Admitting: Internal Medicine

## 2022-04-03 ENCOUNTER — Other Ambulatory Visit: Payer: Self-pay

## 2022-04-03 MED ORDER — LOSARTAN POTASSIUM 25 MG PO TABS: 25.0000 mg | ORAL_TABLET | Freq: Every day | ORAL | 0 refills | Status: DC

## 2022-04-09 DIAGNOSIS — F1320 Sedative, hypnotic or anxiolytic dependence, uncomplicated: Secondary | ICD-10-CM | POA: Diagnosis not present

## 2022-04-09 DIAGNOSIS — F03A0 Unspecified dementia, mild, without behavioral disturbance, psychotic disturbance, mood disturbance, and anxiety: Secondary | ICD-10-CM | POA: Diagnosis not present

## 2022-04-09 DIAGNOSIS — I6930 Unspecified sequelae of cerebral infarction: Secondary | ICD-10-CM | POA: Diagnosis not present

## 2022-04-09 DIAGNOSIS — D692 Other nonthrombocytopenic purpura: Secondary | ICD-10-CM | POA: Diagnosis not present

## 2022-04-09 DIAGNOSIS — J439 Emphysema, unspecified: Secondary | ICD-10-CM | POA: Diagnosis not present

## 2022-04-09 DIAGNOSIS — F419 Anxiety disorder, unspecified: Secondary | ICD-10-CM | POA: Diagnosis not present

## 2022-04-09 DIAGNOSIS — I129 Hypertensive chronic kidney disease with stage 1 through stage 4 chronic kidney disease, or unspecified chronic kidney disease: Secondary | ICD-10-CM | POA: Diagnosis not present

## 2022-04-09 DIAGNOSIS — Z0000 Encounter for general adult medical examination without abnormal findings: Secondary | ICD-10-CM | POA: Diagnosis not present

## 2022-04-09 DIAGNOSIS — I7121 Aneurysm of the ascending aorta, without rupture: Secondary | ICD-10-CM | POA: Diagnosis not present

## 2022-04-09 DIAGNOSIS — I480 Paroxysmal atrial fibrillation: Secondary | ICD-10-CM | POA: Diagnosis not present

## 2022-04-09 DIAGNOSIS — N182 Chronic kidney disease, stage 2 (mild): Secondary | ICD-10-CM | POA: Diagnosis not present

## 2022-04-09 DIAGNOSIS — I700 Atherosclerosis of aorta: Secondary | ICD-10-CM | POA: Diagnosis not present

## 2022-04-13 ENCOUNTER — Emergency Department (HOSPITAL_COMMUNITY): Payer: PPO

## 2022-04-13 ENCOUNTER — Emergency Department (HOSPITAL_COMMUNITY)
Admission: EM | Admit: 2022-04-13 | Discharge: 2022-04-13 | Disposition: A | Discharge status: Home / Self Care | Payer: PPO | Attending: Emergency Medicine | Admitting: Emergency Medicine

## 2022-04-13 ENCOUNTER — Other Ambulatory Visit: Payer: Self-pay

## 2022-04-13 ENCOUNTER — Encounter (HOSPITAL_COMMUNITY): Payer: Self-pay | Admitting: Emergency Medicine

## 2022-04-13 DIAGNOSIS — Z20822 Contact with and (suspected) exposure to covid-19: Secondary | ICD-10-CM | POA: Diagnosis not present

## 2022-04-13 DIAGNOSIS — R5381 Other malaise: Secondary | ICD-10-CM | POA: Diagnosis not present

## 2022-04-13 DIAGNOSIS — R531 Weakness: Secondary | ICD-10-CM | POA: Diagnosis not present

## 2022-04-13 DIAGNOSIS — R059 Cough, unspecified: Secondary | ICD-10-CM | POA: Insufficient documentation

## 2022-04-13 DIAGNOSIS — R197 Diarrhea, unspecified: Secondary | ICD-10-CM | POA: Diagnosis not present

## 2022-04-13 DIAGNOSIS — Z7982 Long term (current) use of aspirin: Secondary | ICD-10-CM | POA: Diagnosis not present

## 2022-04-13 DIAGNOSIS — Z743 Need for continuous supervision: Secondary | ICD-10-CM | POA: Diagnosis not present

## 2022-04-13 DIAGNOSIS — R509 Fever, unspecified: Secondary | ICD-10-CM | POA: Diagnosis not present

## 2022-04-13 DIAGNOSIS — R0602 Shortness of breath: Secondary | ICD-10-CM | POA: Diagnosis not present

## 2022-04-13 DIAGNOSIS — I959 Hypotension, unspecified: Secondary | ICD-10-CM | POA: Diagnosis not present

## 2022-04-13 LAB — URINALYSIS, W/ REFLEX TO CULTURE (INFECTION SUSPECTED)
Bacteria, UA: NONE SEEN
Bilirubin Urine: NEGATIVE
Glucose, UA: NEGATIVE mg/dL
Hgb urine dipstick: NEGATIVE
Ketones, ur: NEGATIVE mg/dL
Leukocytes,Ua: NEGATIVE
Nitrite: NEGATIVE
Protein, ur: NEGATIVE mg/dL
Specific Gravity, Urine: 1.02 (ref 1.005–1.030)
pH: 5 (ref 5.0–8.0)

## 2022-04-13 LAB — CBC WITH DIFFERENTIAL/PLATELET
Abs Immature Granulocytes: 0.06 10*3/uL (ref 0.00–0.07)
Basophils Absolute: 0.1 10*3/uL (ref 0.0–0.1)
Basophils Relative: 1 %
Eosinophils Absolute: 0 10*3/uL (ref 0.0–0.5)
Eosinophils Relative: 0 %
HCT: 52.5 % — ABNORMAL HIGH (ref 39.0–52.0)
Hemoglobin: 16.3 g/dL (ref 13.0–17.0)
Immature Granulocytes: 0 %
Lymphocytes Relative: 4 %
Lymphs Abs: 0.6 10*3/uL — ABNORMAL LOW (ref 0.7–4.0)
MCH: 25.5 pg — ABNORMAL LOW (ref 26.0–34.0)
MCHC: 31 g/dL (ref 30.0–36.0)
MCV: 82.2 fL (ref 80.0–100.0)
Monocytes Absolute: 1.2 10*3/uL — ABNORMAL HIGH (ref 0.1–1.0)
Monocytes Relative: 9 %
Neutro Abs: 12.5 10*3/uL — ABNORMAL HIGH (ref 1.7–7.7)
Neutrophils Relative %: 86 %
Platelets: 330 10*3/uL (ref 150–400)
RBC: 6.39 MIL/uL — ABNORMAL HIGH (ref 4.22–5.81)
RDW: 19.3 % — ABNORMAL HIGH (ref 11.5–15.5)
WBC: 14.4 10*3/uL — ABNORMAL HIGH (ref 4.0–10.5)
nRBC: 0 % (ref 0.0–0.2)

## 2022-04-13 LAB — TROPONIN I (HIGH SENSITIVITY)
Troponin I (High Sensitivity): 9 ng/L (ref <18)
Troponin I (High Sensitivity): 9 ng/L (ref <18)

## 2022-04-13 LAB — COMPREHENSIVE METABOLIC PANEL
ALT: 12 U/L (ref 0–44)
AST: 19 U/L (ref 15–41)
Albumin: 3.9 g/dL (ref 3.5–5.0)
Alkaline Phosphatase: 77 U/L (ref 38–126)
Anion gap: 9 (ref 5–15)
BUN: 12 mg/dL (ref 8–23)
CO2: 22 mmol/L (ref 22–32)
Calcium: 8.8 mg/dL — ABNORMAL LOW (ref 8.9–10.3)
Chloride: 106 mmol/L (ref 98–111)
Creatinine, Ser: 1.14 mg/dL (ref 0.61–1.24)
GFR, Estimated: >60 mL/min (ref >60)
Glucose, Bld: 112 mg/dL — ABNORMAL HIGH (ref 70–99)
Potassium: 4 mmol/L (ref 3.5–5.1)
Sodium: 137 mmol/L (ref 135–145)
Total Bilirubin: 1.3 mg/dL — ABNORMAL HIGH (ref 0.3–1.2)
Total Protein: 6.7 g/dL (ref 6.5–8.1)

## 2022-04-13 LAB — RESP PANEL BY RT-PCR (RSV, FLU A&B, COVID)  RVPGX2
Influenza A by PCR: NEGATIVE
Influenza B by PCR: NEGATIVE
Resp Syncytial Virus by PCR: NEGATIVE
SARS Coronavirus 2 by RT PCR: NEGATIVE

## 2022-04-13 LAB — BRAIN NATRIURETIC PEPTIDE: B Natriuretic Peptide: 146 pg/mL — ABNORMAL HIGH (ref 0.0–100.0)

## 2022-04-13 NOTE — ED Notes (Signed)
Pt unable to make urine at this time.  

## 2022-04-13 NOTE — ED Triage Notes (Signed)
Pt in from [facility] via [facility] with overall malaise and weakness per staff. They state pt normally is very active and walks daily, but for the past few days, he is weaker than his normal self. Pt also a&ox3, some confusion per staff. Pt reporting some diarrhea, cough and fevers recently. Denies any pain or sob. Temp 99.4

## 2022-04-13 NOTE — Discharge Instructions (Signed)
Return for any problem.   Follow-up with Dr. Timothy Lasso tomorrow as discussed.

## 2022-04-13 NOTE — ED Notes (Signed)
Patient transported to CT 

## 2022-04-13 NOTE — ED Provider Notes (Signed)
[place] EMERGENCY DEPARTMENT AT [facility] Provider Note   CSN: [id number] Arrival date & time: 04/13/22  1918     History  Chief Complaint  Patient presents with   Weakness   Diarrhea    Dean Pratt is a 87 y.o. male.  87 year old male with prior medical history as detailed below presents for evaluation.  Patient presents for evaluation of generalized weakness and malaise.  Patient apparently with increased weakness for the last several days.  Patient reports on evaluation that he feels fine.  He is otherwise without complaint this evening.  Family the patient is concerned about possible infectious process.  Patient with apparently mild cough.  Patient without documented fever.  Patient without nausea or vomiting.    The history is provided by the patient and medical records.  Diarrhea      Home Medications Prior to Admission medications   Medication Sig Start Date End Date Taking? Authorizing Provider  acetaminophen (TYLENOL) 325 MG tablet Take 650 mg by mouth every 6 (six) hours as needed for mild pain.    [provider]  allopurinol (ZYLOPRIM) 100 MG tablet Take 100 mg by mouth at bedtime.      [provider]  Ascorbic Acid (VITAMIN C) 1000 MG tablet Take 1,000 mg by mouth daily.     [provider]  aspirin EC 81 MG tablet Take 1 tablet (81 mg total) by mouth daily. Swallow whole. 03/13/21   Micki Riley, MD  cycloSPORINE (RESTASIS) 0.05 % ophthalmic emulsion Place 1 drop into both eyes 2 (two) times daily as needed (dry eyes, irritation). 03/15/18   [provider]  diclofenac Sodium (VOLTAREN) 1 % GEL Apply 2-4 g topically 4 (four) times daily. Patient taking differently: Apply 2-4 g topically 4 (four) times daily as needed (arthritis pain). 09/15/19   Kathryne Hitch, MD  diltiazem (CARDIZEM CD) 240 MG 24 hr capsule TAKE 1 CAPSULE DAILY. PLEASE CALL OFFICE TO SCHEDULE APPOINTMENT FOR FURTHER  REFILLS Patient taking differently: Take 240 mg by mouth daily. 05/25/19   Georgeanna Lea, MD  docusate sodium (COLACE) 100 MG capsule Take 100 mg by mouth daily as needed for mild constipation. 01/20/11   [provider]  donepezil (ARICEPT) 10 MG tablet Take 1 tablet (10 mg total) by mouth at bedtime. 09/15/21   Ihor Austin, NP  famotidine (PEPCID) 20 MG tablet Take 20 mg by mouth at bedtime as needed for heartburn or indigestion. 05/16/20   [provider]  hydroxyurea (HYDREA) 500 MG capsule TAKE ONE CAPSULE BY MOUTH AT NOON 04/03/22   Heilingoetter, Cassandra L, PA-C  LORazepam (ATIVAN) 1 MG tablet Take 0.5-1 tablets (0.5-1 mg total) by mouth 2 (two) times daily as needed for anxiety. 01/14/21   Osvaldo Shipper, MD  losartan (COZAAR) 25 MG tablet Take 1 tablet (25 mg total) by mouth daily. TAKE 1 TABLET DAILY. Strength: 25 mg 04/03/22   Sheilah Pigeon, PA-C  memantine (NAMENDA) 10 MG tablet TAKE ONE TABLET BY MOUTH AT NOON and TAKE ONE TABLET BY MOUTH EVERYDAY AT BEDTIME 01/08/22   Micki Riley, MD  methocarbamol (ROBAXIN) 500 MG tablet Take 1 tablet (500 mg total) by mouth every 8 (eight) hours as needed for muscle spasms. 05/30/19   Tyrell Antonio, MD  Olopatadine HCl 0.2 % SOLN Place 1 drop into both eyes in the morning and at bedtime. 07/10/18   [provider]  OVER THE COUNTER MEDICATION Place 1 spray into  both nostrils daily as needed (congestion). Congestion nasal spray    [provider]  pantoprazole (PROTONIX) 40 MG tablet Take 1 tablet (40 mg total) by mouth daily. 09/02/20   Hilarie FredricksonPerry, John N, MD  rosuvastatin (CRESTOR) 10 MG tablet Take 10 mg by mouth daily. 09/06/17   [provider]  vitamin B-12 (CYANOCOBALAMIN) 1000 MCG tablet Take 1,000 mcg by mouth daily.    [provider]      Allergies    Ace inhibitors and Warfarin and related    Review of Systems   Review of Systems  All other systems reviewed and are  negative.   Physical Exam Updated Vital Signs BP 117/74   Pulse (!) 59   Temp 98.2 F (36.8 C) (Oral)   Resp (!) 28   Wt 73.7 kg   SpO2 95%   BMI 24.71 kg/m  Physical Exam Vitals and nursing note reviewed.  Constitutional:      General: He is not in acute distress.    Appearance: Normal appearance. He is well-developed.  HENT:     Head: Normocephalic and atraumatic.  Eyes:     Conjunctiva/sclera: Conjunctivae normal.     Pupils: Pupils are equal, round, and reactive to light.  Cardiovascular:     Rate and Rhythm: Normal rate and regular rhythm.     Heart sounds: Normal heart sounds.  Pulmonary:     Effort: Pulmonary effort is normal. No respiratory distress.     Breath sounds: Normal breath sounds.  Abdominal:     General: There is no distension.     Palpations: Abdomen is soft.     Tenderness: There is no abdominal tenderness.  Musculoskeletal:        General: No deformity. Normal range of motion.     Cervical back: Normal range of motion and neck supple.  Skin:    General: Skin is warm and dry.  Neurological:     General: No focal deficit present.     Mental Status: He is alert and oriented to person, place, and time.     ED Results / Procedures / Treatments   Labs (all labs ordered are listed, but only abnormal results are displayed) Labs Reviewed  CBC WITH DIFFERENTIAL/PLATELET - Abnormal; Notable for the following components:      Result Value   WBC 14.4 (*)    RBC 6.39 (*)    HCT 52.5 (*)    MCH 25.5 (*)    RDW 19.3 (*)    Neutro Abs 12.5 (*)    Lymphs Abs 0.6 (*)    Monocytes Absolute 1.2 (*)    All other components within normal limits  COMPREHENSIVE METABOLIC PANEL - Abnormal; Notable for the following components:   Glucose, Bld 112 (*)    Calcium 8.8 (*)    Total Bilirubin 1.3 (*)    All other components within normal limits  BRAIN NATRIURETIC PEPTIDE - Abnormal; Notable for the following components:   B Natriuretic Peptide 146.0 (*)    All  other components within normal limits  RESP PANEL BY RT-PCR (RSV, FLU A&B, COVID)  [id number]  URINALYSIS, W/ REFLEX TO CULTURE (INFECTION SUSPECTED)  TROPONIN I (HIGH SENSITIVITY)  TROPONIN I (HIGH SENSITIVITY)    EKG EKG Interpretation  Date/Time:  Monday April 13 2022 19:33:07 EDT Ventricular Rate:  59 PR Interval:    QRS Duration: 95 QT Interval:  402 QTC Calculation: 399 R Axis:   79 Text Interpretation: Atrial fibrillation Low voltage, precordial  leads Anteroseptal infarct, old Confirmed by Kristine Royal [phone]) on 04/13/2022 7:54:25 PM  Radiology DG Chest Port 1 View  Result Date: 04/13/2022 CLINICAL DATA:  Weakness and shortness of breath EXAM: PORTABLE CHEST 1 VIEW COMPARISON:  CTA chest and radiographs 01/09/2021 FINDINGS: Stable cardiomediastinal silhouette. Aortic atherosclerotic calcification. Hyperinflation. No focal consolidation, pleural effusion, or pneumothorax. No acute osseous abnormality. IMPRESSION: No active disease. Electronically Signed   By: Minerva Fester M.D.   On: 04/13/2022 21:00   CT Head Wo Contrast  Result Date: 04/13/2022 CLINICAL DATA:  Malaise and weakness. EXAM: CT HEAD WITHOUT CONTRAST TECHNIQUE: Contiguous axial images were obtained from the base of the skull through the vertex without intravenous contrast. RADIATION DOSE REDUCTION: This exam was performed according to the departmental dose-optimization program which includes automated exposure control, adjustment of the mA and/or kV according to patient size and/or use of iterative reconstruction technique. COMPARISON:  April 30, 2021 FINDINGS: Brain: There is moderate severity cerebral atrophy with widening of the extra-axial spaces and ventricular dilatation. There are areas of decreased attenuation within the white matter tracts of the supratentorial brain, consistent with microvascular disease changes. A chronic right parieto-occipital infarct is seen. Vascular: No hyperdense vessel or unexpected  calcification. Skull: Normal. Negative for fracture or focal lesion. Sinuses/Orbits: There is marked severity bilateral ethmoid sinus mucosal thickening. Other: None. IMPRESSION: 1. No acute intracranial abnormality. 2. Chronic right parieto-occipital infarct. 3. Marked severity bilateral ethmoid sinus disease. Electronically Signed   By: Aram Candela M.D.   On: 04/13/2022 20:46    Procedures Procedures    Medications Ordered in ED Medications - No data to display  ED Course/ Medical Decision Making/ A&P Clinical Course as of 04/13/22 2344  Mon Apr 13, 2022  2344 DG Chest [place] 1 View [PM]    Clinical Course User Index [PM] Wynetta Fines, MD                             Medical Decision Making Amount and/or Complexity of Data Reviewed Labs: ordered. Radiology: ordered.    Medical Screen Complete  This patient presented to the ED with complaint of generalized weakness.  This complaint involves an extensive number of treatment options. The initial differential diagnosis includes, but is not limited to, infection, metabolic abnormality, etc.  This presentation is: Acute, Chronic, Self-Limited, Previously Undiagnosed, Uncertain Prognosis, Complicated, Systemic Symptoms, and Threat to Life/Bodily Function  Patient is presenting with complaint of generalized malaise and fatigue x 2 to 3 days.  Patient is denying any symptoms upon arrival and evaluation.  Screening labs obtained are without significant abnormality.  Patient without evidence of significant infection.  Patient without evidence of cardiac ischemia.  Patient appears to be appropriate for discharge.  Patient and patient's family reassured that close outpatient follow-up is appropriate.  Patient is followed by Dr. Timothy Lasso for primary care.  Importance of close follow-up is stressed.  Strict return precautions given and understood.  Additional history obtained:  External records from outside sources obtained and  reviewed including prior ED visits and prior Inpatient records.    Lab Tests:  I ordered and personally interpreted labs.  The pertinent results include: CBC, CMP, BNP, UA, COVID, flu, troponin x 2   Imaging Studies ordered:  I ordered imaging studies including CT head, chest x-ray I independently visualized and interpreted obtained imaging which showed NAD I agree with the radiologist interpretation.   Cardiac Monitoring:  The patient was  maintained on a cardiac monitor.  I personally viewed and interpreted the cardiac monitor which showed an underlying rhythm of: A-fib  Problem List / ED Course:  Generalized weakness   Reevaluation:  After the interventions noted above, I reevaluated the patient and found that they have: improved   Disposition:  After consideration of the diagnostic results and the patients response to treatment, I feel that the patent would benefit from close outpatient follow-up.          Final Clinical Impression(s) / ED Diagnoses Final diagnoses:  Weakness    Rx / DC Orders ED Discharge Orders     None         Wynetta Fines, MD 04/13/22 [phone]

## 2022-04-13 NOTE — ED Notes (Signed)
Patient verbalizes understanding of discharge instructions. Opportunity for questioning and answers were provided. Armband removed by staff, pt discharged from ED. Wheeled out to lobby, left with family

## 2022-04-16 ENCOUNTER — Telehealth: Payer: Self-pay

## 2022-04-16 DIAGNOSIS — J0120 Acute ethmoidal sinusitis, unspecified: Secondary | ICD-10-CM | POA: Diagnosis not present

## 2022-04-16 DIAGNOSIS — R531 Weakness: Secondary | ICD-10-CM | POA: Diagnosis not present

## 2022-04-16 DIAGNOSIS — F03A0 Unspecified dementia, mild, without behavioral disturbance, psychotic disturbance, mood disturbance, and anxiety: Secondary | ICD-10-CM | POA: Diagnosis not present

## 2022-04-16 DIAGNOSIS — I480 Paroxysmal atrial fibrillation: Secondary | ICD-10-CM | POA: Diagnosis not present

## 2022-04-16 DIAGNOSIS — I10 Essential (primary) hypertension: Secondary | ICD-10-CM | POA: Diagnosis not present

## 2022-04-16 NOTE — Telephone Encounter (Signed)
        Patient  visited [facility] on 4/8   Telephone encounter attempt :  1st  A HIPAA compliant voice message was left requesting a return call.  Instructed patient to call back    Clora Ohmer [facility] Guide, [place] [phone] [address], [place], [place] [address] Phone: [phone] Email: [email or web address]       

## 2022-04-21 ENCOUNTER — Telehealth: Payer: Self-pay | Admitting: Internal Medicine

## 2022-04-21 NOTE — Telephone Encounter (Signed)
Rescheduled 04/30 appointment to 05/07 due to provider pal, patient has bee called and notified. 

## 2022-04-30 ENCOUNTER — Telehealth: Payer: Self-pay | Admitting: Neurology

## 2022-04-30 NOTE — Telephone Encounter (Signed)
I called patient's wife, Eber Jones, per DPR, to discuss. No answer, left a message asking her to call us back. If she calls back, please route to POD 3.  If patient's confusion is acute or abrupt, he likely should seek emergency/urgent care to rule out urgent issues.

## 2022-04-30 NOTE — Telephone Encounter (Signed)
Patient's wife, Eber Jones, per DPR, returned my call. She reports that ~ 2-3 weeks ago, patient's mental status seemed to suddenly worsen. He is unable to get around the house because he is constantly "lost". He forgets where the doors in the house are located. Prior to ~3 weeks ago, he was able to navigate the house without assistance. Patient's wife reports that other than a cold ~3 weeks ago, he has been stable medically. I recommended that they seek urgent care today to rule out infections, such as UTI, or other acute conditions that may be causing the acute confusion. Patient's wife agreed to call Dr. Ferd Hibbs office for a work-in appointment today, and if this is unsuccessful, to proceed to an urgent care location or ER to obtain further testing. Patient's next appointment with [place] is in September. I will inform Dr. Pearlean Brownie of these recommendations provided and if he has anything further to add, we will call the patient back.

## 2022-04-30 NOTE — Telephone Encounter (Signed)
Pt's wife called stating that her husband's memory has worsened in the last 3 weeks and is needing to discuss with RN or MD. She states that he is forgetting where he is, what room he's in, where the front or back door is etc.

## 2022-05-01 DIAGNOSIS — F03A0 Unspecified dementia, mild, without behavioral disturbance, psychotic disturbance, mood disturbance, and anxiety: Secondary | ICD-10-CM | POA: Diagnosis not present

## 2022-05-01 DIAGNOSIS — R410 Disorientation, unspecified: Secondary | ICD-10-CM | POA: Diagnosis not present

## 2022-05-01 DIAGNOSIS — I700 Atherosclerosis of aorta: Secondary | ICD-10-CM | POA: Diagnosis not present

## 2022-05-01 DIAGNOSIS — I6930 Unspecified sequelae of cerebral infarction: Secondary | ICD-10-CM | POA: Diagnosis not present

## 2022-05-01 DIAGNOSIS — I480 Paroxysmal atrial fibrillation: Secondary | ICD-10-CM | POA: Diagnosis not present

## 2022-05-01 DIAGNOSIS — H53462 Homonymous bilateral field defects, left side: Secondary | ICD-10-CM | POA: Diagnosis not present

## 2022-05-01 DIAGNOSIS — D692 Other nonthrombocytopenic purpura: Secondary | ICD-10-CM | POA: Diagnosis not present

## 2022-05-01 DIAGNOSIS — I7121 Aneurysm of the ascending aorta, without rupture: Secondary | ICD-10-CM | POA: Diagnosis not present

## 2022-05-01 DIAGNOSIS — I129 Hypertensive chronic kidney disease with stage 1 through stage 4 chronic kidney disease, or unspecified chronic kidney disease: Secondary | ICD-10-CM | POA: Diagnosis not present

## 2022-05-01 DIAGNOSIS — R531 Weakness: Secondary | ICD-10-CM | POA: Diagnosis not present

## 2022-05-01 DIAGNOSIS — N182 Chronic kidney disease, stage 2 (mild): Secondary | ICD-10-CM | POA: Diagnosis not present

## 2022-05-01 DIAGNOSIS — I10 Essential (primary) hypertension: Secondary | ICD-10-CM | POA: Diagnosis not present

## 2022-05-05 ENCOUNTER — Other Ambulatory Visit: Payer: PPO

## 2022-05-05 ENCOUNTER — Ambulatory Visit: Payer: PPO | Admitting: Internal Medicine

## 2022-05-12 ENCOUNTER — Inpatient Hospital Stay: Payer: PPO

## 2022-05-12 ENCOUNTER — Other Ambulatory Visit: Payer: Self-pay | Admitting: Internal Medicine

## 2022-05-12 ENCOUNTER — Other Ambulatory Visit: Payer: Self-pay

## 2022-05-12 ENCOUNTER — Inpatient Hospital Stay (HOSPITAL_BASED_OUTPATIENT_CLINIC_OR_DEPARTMENT_OTHER): Payer: PPO | Admitting: Internal Medicine

## 2022-05-12 ENCOUNTER — Inpatient Hospital Stay: Payer: PPO | Attending: Internal Medicine

## 2022-05-12 ENCOUNTER — Encounter: Payer: Self-pay | Admitting: Medical Oncology

## 2022-05-12 VITALS — BP 124/71 | HR 55 | Resp 16

## 2022-05-12 VITALS — BP 143/87 | HR 55 | Temp 97.5°F | Resp 16 | Wt 163.6 lb

## 2022-05-12 DIAGNOSIS — D473 Essential (hemorrhagic) thrombocythemia: Secondary | ICD-10-CM | POA: Insufficient documentation

## 2022-05-12 DIAGNOSIS — D45 Polycythemia vera: Secondary | ICD-10-CM

## 2022-05-12 DIAGNOSIS — D75839 Thrombocytosis, unspecified: Secondary | ICD-10-CM | POA: Diagnosis not present

## 2022-05-12 LAB — CBC WITH DIFFERENTIAL (CANCER CENTER ONLY)
Abs Immature Granulocytes: 0.03 10*3/uL (ref 0.00–0.07)
Basophils Absolute: 0.1 10*3/uL (ref 0.0–0.1)
Basophils Relative: 2 %
Eosinophils Absolute: 0.2 10*3/uL (ref 0.0–0.5)
Eosinophils Relative: 3 %
HCT: 54.3 % — ABNORMAL HIGH (ref 39.0–52.0)
Hemoglobin: 17 g/dL (ref 13.0–17.0)
Immature Granulocytes: 0 %
Lymphocytes Relative: 16 %
Lymphs Abs: 1.4 10*3/uL (ref 0.7–4.0)
MCH: 26.5 pg (ref 26.0–34.0)
MCHC: 31.3 g/dL (ref 30.0–36.0)
MCV: 84.7 fL (ref 80.0–100.0)
Monocytes Absolute: 0.8 10*3/uL (ref 0.1–1.0)
Monocytes Relative: 8 %
Neutro Abs: 6.4 10*3/uL (ref 1.7–7.7)
Neutrophils Relative %: 71 %
Platelet Count: 371 10*3/uL (ref 150–400)
RBC: 6.41 MIL/uL — ABNORMAL HIGH (ref 4.22–5.81)
RDW: 21 % — ABNORMAL HIGH (ref 11.5–15.5)
WBC Count: 9 10*3/uL (ref 4.0–10.5)
nRBC: 0 % (ref 0.0–0.2)

## 2022-05-12 LAB — CMP (CANCER CENTER ONLY)
ALT: 19 U/L (ref 0–44)
AST: 22 U/L (ref 15–41)
Albumin: 4.3 g/dL (ref 3.5–5.0)
Alkaline Phosphatase: 72 U/L (ref 38–126)
Anion gap: 8 (ref 5–15)
BUN: 12 mg/dL (ref 8–23)
CO2: 27 mmol/L (ref 22–32)
Calcium: 9.8 mg/dL (ref 8.9–10.3)
Chloride: 105 mmol/L (ref 98–111)
Creatinine: 1.22 mg/dL (ref 0.61–1.24)
GFR, Estimated: 58 mL/min — ABNORMAL LOW (ref >60)
Glucose, Bld: 98 mg/dL (ref 70–99)
Potassium: 4.5 mmol/L (ref 3.5–5.1)
Sodium: 140 mmol/L (ref 135–145)
Total Bilirubin: 0.8 mg/dL (ref 0.3–1.2)
Total Protein: 7.1 g/dL (ref 6.5–8.1)

## 2022-05-12 LAB — LACTATE DEHYDROGENASE: LDH: 130 U/L (ref 98–192)

## 2022-05-12 MED ORDER — HYDROXYUREA 500 MG PO CAPS: ORAL_CAPSULE | ORAL | 2 refills | Status: DC

## 2022-05-12 NOTE — Progress Notes (Signed)
Hydrea redirected to upstream.

## 2022-05-12 NOTE — Addendum Note (Signed)
Addended by: Charma Igo on: 05/12/2022 10:34 AM   Modules accepted: Orders

## 2022-05-12 NOTE — Progress Notes (Signed)
Waymond Cera presents today for phlebotomy per MD orders. Phlebotomy procedure started at 0948 and ended at [phone]. 512 cc removed. Patient tolerated procedure well. IV needle removed intact.

## 2022-05-12 NOTE — Patient Instructions (Addendum)
Essential Thrombocythemia  Essential thrombocythemia is a condition in which a person has too many platelets (thrombocytes) in the blood. Platelets are parts of blood that stick together and form a clot (thrombus) to help the body stop bleeding after an injury. This condition may also be called primary or essential thrombocytosis. Essential thrombocythemia happens when abnormal cells in the bone marrow (megakaryocytes) make too many platelets. What are the causes? The cause of this condition is not known. What are the signs or symptoms? This condition may not cause any symptoms. If you have symptoms, they may include: Blood clots. Weakness. Headache. Itching. Sweating or fever. Dizziness or confusion. Tingling or burning in your hands or feet. Symptoms may also include bleeding and an enlarged spleen. How is this diagnosed? This condition may be diagnosed based on: A physical exam. Your symptoms. Your medical history. Blood tests. A procedure to collect a sample of your bone marrow (bone marrow aspiration) for testing. How is this treated? If you do not have symptoms, you may not need treatment. Your health care provider may monitor your condition with regular blood tests. If you have symptoms, or if your platelet count is very high, you may be treated with: Aspirin or other medicines to thin your blood and help prevent blood clots. Medicines to reduce the number of platelets in your blood. A procedure to remove some platelets from your blood (plateletpheresis). During this procedure: Your health care provider will place an IV into one of your veins. The IV will be used to draw your blood into a machine that separates out the extra platelets. The blood with reduced platelets will be returned to your body. Follow these instructions at home: Medicines Take over-the-counter and prescription medicines only as told by your health care provider. If you are taking blood thinners: Talk  with your health care provider before you take any medicines that contain aspirin or NSAIDs, such as ibuprofen. These medicines increase your risk for dangerous bleeding. Take your medicine exactly as told, at the same time every day. Avoid activities that could cause injury or bruising, and follow instructions about how to prevent falls. Wear a medical alert bracelet or carry a card that lists what medicines you take. Tell all health care providers, including dental health care providers, about any medicines you are taking to prevent blood clots. General instructions Do not use any products that contain nicotine or tobacco. These products include cigarettes, chewing tobacco, and vaping devices, such as e-cigarettes. If you need help quitting, ask your health care provider. Ask your health care provider about managing or preventing high cholesterol, high blood pressure, and diabetes. These conditions can make essential thrombocythemia worse. Keep all follow-up visits. This is important. Contact a health care provider if: You have severe pain, and medicines do not help. You have problems taking your medicines to prevent blood clots. You have new pain, warmth, swelling, or redness in an arm or leg. This could mean you have a blood clot. You faint. You have unusual bruises. You have bloody or tarry stools. You have pink or bloody urine. Your menstrual periods are heavier than normal, if applicable. You have nosebleeds, bleeding gums, or other bleeding. Get help right away if:  You have chest pain. You have trouble breathing. You have any symptoms of a stroke. "BE FAST" is an easy way to remember the main warning signs of a stroke: B - Balance. Signs are dizziness, sudden trouble walking, or loss of balance. E - Eyes. Signs are  trouble seeing or a sudden change in vision. F - Face. Signs are sudden weakness or numbness of the face, or the face or eyelid drooping on one side. A - Arm. Signs are  weakness or numbness in an arm. This happens suddenly and usually on one side of the body. S - Speech. Signs are sudden trouble speaking, slurred speech, or trouble understanding what people say. T - Time. Time to call emergency services. Write down what time symptoms started. You have other signs of a stroke, such as: A sudden, severe headache with no known cause. Nausea or vomiting. Seizure. These symptoms may represent a serious problem that is an emergency. Do not wait to see if the symptoms will go away. Get medical help right away. Call your local emergency services (911 in the [place] Do not drive yourself to the hospital. Summary Essential thrombocythemia happens when abnormal cells in the bone marrow make too many platelets. If you have symptoms, or if your platelet count is very high, you may need treatment. Treatment can vary and may include medicines to thin the blood and prevent blood clots. Ask your health care provider about how to manage or prevent high cholesterol, high blood pressure, and diabetes. All of these can make essential thrombocythemia worse. Get help right away if you have any symptoms of stroke. This information is not intended to replace advice given to you by your health care provider. Make sure you discuss any questions you have with your health care provider. Document Revised: 06/24/2020 Document Reviewed: 06/24/2020 Elsevier Patient Education  2023 [employer]  Polycythemia Vera Polycythemia vera (PV) is a form of blood cancer called a myeloproliferative neoplasm (MPN) in which the bone marrow makes too many red blood cells. The bone marrow may also make too many clotting cells (platelets) and white blood cells. Bone marrow is the spongy center of bones where blood cells are produced. Sometimes, this causes an overproduction of blood cells in the liver and spleen, causing those organs to become enlarged. Additionally, people who have PV are at a higher risk for  stroke or heart attack because their blood may clot more easily. PV is a long-term, or chronic, disease. What are the causes? Almost all people who have PV have an abnormal gene (genetic mutation) that causes changes in the way that the bone marrow makes blood cells. This gene, which is called JAK2, is not hereditary. This means that it is not passed along from parent to child. It is not known what triggers the genetic mutation that causes the body to produce too many red blood cells. What increases the risk? You are more likely to develop this condition if: You are male. You are 11 years of age or older. What are the signs or symptoms? You may not have any symptoms in the early stage of PV. When symptoms develop, they may include: Itchy skin, especially after bathing. Burning sensation in the hands or feet. Abdominal fullness or feeling full soon after eating. Ulcers on fingers or toes. Shortness of breath. Bone or joint pain. Dizziness. Headache. Tiredness. Ringing in the ears. Weight loss. Unusual bleeding, including nosebleeds or bleeding from gums. How is this diagnosed? This condition may be diagnosed during a routine physical exam and a blood test called a complete blood count (CBC). Your health care provider may also suspect PV if you have symptoms. During the physical exam, your health care provider may find that you have an enlarged liver or spleen. You may also  have tests to confirm the diagnosis. These may include: A procedure to remove a sample of bone marrow for testing (bone marrow biopsy). Blood tests to check for: The JAK2 gene. Low levels of a hormone that helps to regulate red blood cell production (erythropoietin). How is this treated? There is no cure for PV, but treatment can help to control the disease. There are several types of treatment. No single treatment works for everyone. You will need to work with a blood cancer specialist (hematologist) to find the  treatment that is best for you. This condition may be treated by: Periodically having some blood removed with a needle (phlebotomy) to lower the number of red blood cells. Taking medicine. Your health care provider may recommend: Low-dose aspirin to lower your risk for blood clots. A medicine to control blood counts. A medicine that slows down the effects of JAK2 gene. Other medicines to treat symptoms such as itching. Follow these instructions at home:  Take over-the-counter and prescription medicines only as told by your health care provider. Do regular exercise as told by your health care provider. Check your hands and feet regularly for any sores that do not heal. Do not use any products that contain nicotine or tobacco. These products include cigarettes, chewing tobacco, and vaping devices, such as e-cigarettes. If you need help quitting, ask your health care provider. Return to your normal activities as told by your health care provider. Ask your health care provider what activities are safe for you. Keep all follow-up visits. This is important. Contact a health care provider if: You have side effects from your medicines. Your symptoms change or get worse at home. You have blood in your stool or you vomit blood. Get help right away if: You have sudden and severe pain in your abdomen. You have chest pain or difficulty breathing. You have any symptoms of a stroke. "BE FAST" is an easy way to remember the main warning signs of a stroke: B - Balance. Signs are dizziness, sudden trouble walking, or loss of balance. E - Eyes. Signs are trouble seeing or a sudden change in vision. F - Face. Signs are sudden weakness or numbness of the face, or the face or eyelid drooping on one side. A - Arms. Signs are weakness or numbness in an arm. This happens suddenly and usually on one side of the body. S - Speech. Signs are sudden trouble speaking, slurred speech, or trouble understanding what people  say. T - Time. Time to call emergency services. Write down what time symptoms started. You have other signs of a stroke, such as: A sudden, severe headache with no known cause. Nausea or vomiting. Seizure. These symptoms may be an emergency. Get help right away. Call 911. Do not wait to see if the symptoms will go away. Do not drive yourself to the hospital. Summary Polycythemia vera is a form of blood cancer in which the bone marrow makes too many red blood cells. People who have polycythemia vera are at a higher risk for stroke or heart attack because their blood may clot more easily. The disease is most often associated with an abnormal gene (genetic mutation) that causes changes in the way that the bone marrow makes blood cells. There is no cure for PV, but treatment can help to control the disease. It is treated by periodically having some blood removed and by taking medicines. This information is not intended to replace advice given to you by your health care provider. Make  sure you discuss any questions you have with your health care provider. Hydroxyurea Capsules What is this medication? HYDROXYUREA (hye drox ee yoor EE a) It works by keeping red blood cells round and flexible, which prevents blood cells from clumping together. This also increases blood flow and the amount of oxygen that gets to your tissues. It can be used to treat polycythemia vera or PV. This medicine may be used for other purposes; ask your health care provider or pharmacist if you have questions. COMMON BRAND NAME(S): DROXIA, HYDREA What should I tell my care team before I take this medication? They need to know if you have any of these conditions: How should I use this medication? Take this medication by mouth with a glass of water. Take it as directed on the prescription label at the same time every day. Keep taking it unless your care team tells you to stop. Handling this medication may be harmful. Wash your  hands before or after touching this medication or the bottle. Caregivers should wear gloves while touching the medication or bottle. Talk to your care team about how to handle this medication, special instructions may apply.  Talk to your care team about the use of this medication in children. Special care may be needed. Overdosage: If you think you have taken too much of this medicine contact a poison control center or emergency room at once. NOTE: This medicine is only for you. Do not share this medicine with others. What if I miss a dose? If you miss a dose, take it as soon as you can. If it is almost time for your next dose, take only that dose. Do not take double or extra doses. What may interact with this medication? Didanosine Live virus vaccines Stavudine This list may not describe all possible interactions. Give your health care provider a list of all the medicines, herbs, non-prescription drugs, or dietary supplements you use. Also tell them if you smoke, drink alcohol, or use illegal drugs. Some items may interact with your medicine. What should I watch for while using this medication? Visit your care team for regular checks on your progress. Tell your care team if you symptoms do not start to get better or if they get worse. This medication may make you feel generally unwell. Report any side effects. Continue your course of treatment even though you feel ill unless your care team tells you to stop. You may need blood work while taking this medication. This medication may increase your risk of getting an infection. Call your care team for advice if you get a fever, chills, sore throat, or other symptoms of a cold or flu. Do not treat yourself. Try to avoid being around people who are sick. Talk to your care team about your risk of cancer. You may be more at risk for certain types of cancer if you take this medication. If you wear a continuous glucose monitor (CGM), this medication may  affect your sensor blood sugar (glucose) results. Talk to your care team about whether it is safe to use your CGM to dose insulin. Talk to your care team if you or your partner may be pregnant. Serious birth defects can occur if you take this medication during pregnancy and for 6 months after the last dose. You will need a negative pregnancy test before starting this medication. Contraception is recommended while taking this medication and for 6 months after the last dose. Your care team can help you find the option  that works for you. If your partner can get pregnant, use a condom during sex while taking this medication and for at least 1 year the last dose. Talk to your care team before breastfeeding. Changes to your treatment plan may be needed. This medication may cause infertility. Talk to your care team if you are concerned about your fertility. What side effects may I notice from receiving this medication? Side effects that you should report to your care team as soon as possible: Allergic reactions--skin rash, itching, hives, swelling of the face, lips, tongue, or throat Dry cough, shortness of breath or trouble breathing Hemolytic anemia--unusual weakness or fatigue, dizziness, headache, trouble breathing, dark urine, yellowing skin or eyes Infection--fever, chills, cough, sore throat, wounds that don't heal, pain or trouble when passing urine, general feeling of discomfort or being unwell Painful swelling, warmth, or redness of the skin, blisters or sores Unusual bruising or bleeding Side effects that usually do not require medical attention (report to your care team if they continue or are bothersome): Constipation Diarrhea Loss of appetite Nausea Pain, redness, or swelling with sores inside the mouth or throat This list may not describe all possible side effects. Call your doctor for medical advice about side effects. You may report side effects to FDA at 1-800-FDA-1088. Where should I  keep my medication? Keep out of the reach of children and pets. See product for storage instructions. Each product may have different instructions. Keep the container tightly closed. Get rid of any unused medication after the expiration date. To get rid of medications that are no longer wanted or have expired: Take the medication to a medication take-back program. Check with your pharmacy or law enforcement to find a location. If you cannot return the medication, check the label or package insert to see if the medication should be thrown out in the garbage or flushed down the toilet. If you are not sure, ask your care team. If it is safe to put it in the trash, empty the medication out of the container. Mix the medication with cat litter, dirt, coffee grounds, or other unwanted substance. Seal the mixture in a bag or container. Put it in the trash. NOTE: This sheet is a summary. It may not cover all possible information. If you have questions about this medicine, talk to your doctor, pharmacist, or health care provider.  2023 Elsevier/Gold Standard (2021-03-03 00:00:00)  Document Revised: 12/17/2020 Document Reviewed: 12/17/2020 Elsevier Patient Education  2023 [employer].

## 2022-05-12 NOTE — Progress Notes (Signed)
Patient seen by Dr. Gypsy Balsam are .within normal parameters  Labs reviewed:   HCT of 54.3 Per Dr. Arbutus Ped , Please  perform a phlebotomy on pt with his  HCT of 54.3.  Per physician team, patient is ready for treatment and there are NO modifications to the treatment plan.

## 2022-05-12 NOTE — Patient Instructions (Signed)

## 2022-05-12 NOTE — Progress Notes (Signed)
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    PRIOR THERAPY: Phlebotomy on as-needed basis  CURRENT THERAPY: Hydroxyurea 500 mg p.o. daily.  INTERVAL HISTORY: Dean Pratt 87 y.o. male returns to the clinic today for follow-up visit.  The patient is feeling fine with no concerning complaints except for occasional fatigue and headache.  He has not been taking his medication with the hydroxyurea as prescribed and he has been out of his medication for a while without calling for refill.  He denied having any current chest pain, shortness of breath, cough or hemoptysis.  He has no nausea, vomiting, diarrhea or constipation.  He has no fever or chills.  He is here today for evaluation and repeat blood work.    MEDICAL HISTORY: Past Medical History:  Diagnosis Date   Anxiety    Blood transfusion without reported diagnosis    BPH (benign prostatic hyperplasia)    Cancer (HCC)    basal cell CA removed   Cataract    removed with lens implants    CHF (congestive heart failure) (HCC)    Colitis, ischemic (HCC)    secondary to ebolism from afib 1/10   Constipation    GERD (gastroesophageal reflux disease)    Gout    HTN (hypertension)    Hyperlipidemia    Intracranial bleed (HCC) 04/10/2010   Right occipital hematoma with a left homonymous hemianopsia    Migraines    Paroxysmal atrial fibrillation (HCC)    s/p PVI 04/09/09 and 10/17/10   Rosacea    Stroke (HCC) 04/08/2010   hemorragic, anticoagulation stopped at that time   Tuberculosis    s/p treatment 1964    ALLERGIES:  is allergic to ace inhibitors and warfarin and related.  MEDICATIONS:  Current Outpatient Medications  Medication Sig Dispense Refill   acetaminophen (TYLENOL) 325 MG tablet Take 650 mg by mouth every 6 (six) hours as needed for mild  pain.     allopurinol (ZYLOPRIM) 100 MG tablet Take 100 mg by mouth at bedtime.       Ascorbic Acid (VITAMIN C) 1000 MG tablet Take 1,000 mg by mouth daily.      aspirin EC 81 MG tablet Take 1 tablet (81 mg total) by mouth daily. Swallow whole. 30 tablet 11   cycloSPORINE (RESTASIS) 0.05 % ophthalmic emulsion Place 1 drop into both eyes 2 (two) times daily as needed (dry eyes, irritation).     diclofenac Sodium (VOLTAREN) 1 % GEL Apply 2-4 g topically 4 (four) times daily. (Patient taking differently: Apply 2-4 g topically 4 (four) times daily as needed (arthritis pain).) 200 g 3   diltiazem (CARDIZEM CD) 240 MG 24 hr capsule TAKE 1 CAPSULE DAILY. PLEASE CALL OFFICE TO SCHEDULE APPOINTMENT FOR FURTHER REFILLS (Patient taking differently: Take 240 mg by mouth daily.) 15 capsule 0   docusate sodium (COLACE) 100 MG capsule Take 100 mg by mouth daily as needed for mild constipation.     donepezil (ARICEPT) 10 MG tablet Take 1 tablet (10 mg total) by mouth at bedtime. 90 tablet 3   famotidine (PEPCID) 20 MG tablet Take 20 mg by mouth at bedtime as needed for heartburn or indigestion.     hydroxyurea (HYDREA) 500 MG capsule TAKE ONE CAPSULE BY MOUTH AT NOON 30 capsule 2   LORazepam (ATIVAN) 1  MG tablet Take 0.5-1 tablets (0.5-1 mg total) by mouth 2 (two) times daily as needed for anxiety. 25 tablet 0   losartan (COZAAR) 25 MG tablet Take 1 tablet (25 mg total) by mouth daily. TAKE 1 TABLET DAILY. Strength: 25 mg 30 tablet 0   memantine (NAMENDA) 10 MG tablet TAKE ONE TABLET BY MOUTH AT NOON and TAKE ONE TABLET BY MOUTH EVERYDAY AT BEDTIME 180 tablet 2   methocarbamol (ROBAXIN) 500 MG tablet Take 1 tablet (500 mg total) by mouth every 8 (eight) hours as needed for muscle spasms. 60 tablet 2   Olopatadine HCl 0.2 % SOLN Place 1 drop into both eyes in the morning and at bedtime.     OVER THE COUNTER MEDICATION Place 1 spray into both nostrils daily as needed (congestion). Congestion nasal spray      pantoprazole (PROTONIX) 40 MG tablet Take 1 tablet (40 mg total) by mouth daily. 90 tablet 3   rosuvastatin (CRESTOR) 10 MG tablet Take 10 mg by mouth daily.  2   vitamin B-12 (CYANOCOBALAMIN) 1000 MCG tablet Take 1,000 mcg by mouth daily.     No current facility-administered medications for this visit.    SURGICAL HISTORY:  Past Surgical History:  Procedure Laterality Date   ablation  04/09/2009   s/p afib and atrial flutter ablation by JA   ABLATION OF DYSRHYTHMIC FOCUS  10/15/09   repeat afib ablation by JA   APPENDECTOMY     CATARACT EXTRACTION     CATARACT EXTRACTION, BILATERAL     with lens implants    COLONOSCOPY     CORNEAL LACERATION REPAIR     EYE SURGERY     INGUINAL HERNIA REPAIR     MASS EXCISION Left 12/20/2020   Procedure: EXCISION  SOFT TISSUE MASS LEFT FLANK;  Surgeon: Darnell Level, MD;  Location: [facility];  Service: General;  Laterality: Left;   POLYPECTOMY     TONSILLECTOMY     VASECTOMY      REVIEW OF SYSTEMS:  A comprehensive review of systems was negative except for: Constitutional: positive for fatigue Neurological: positive for headaches   PHYSICAL EXAMINATION: General appearance: alert, cooperative, fatigued, and no distress Head: Normocephalic, without obvious abnormality, atraumatic Neck: no adenopathy, no JVD, supple, symmetrical, trachea midline, and thyroid not enlarged, symmetric, no tenderness/mass/nodules Lymph nodes: Cervical, supraclavicular, and axillary nodes normal. Resp: clear to auscultation bilaterally Back: symmetric, no curvature. ROM normal. No CVA tenderness. Cardio: regular rate and rhythm, S1, S2 normal, no murmur, click, rub or gallop GI: soft, non-tender; bowel sounds normal; no masses,  no organomegaly Extremities: extremities normal, atraumatic, no cyanosis or edema  ECOG PERFORMANCE STATUS: 1 - Symptomatic but completely ambulatory  Blood pressure (!) 143/87, pulse (!) 55, temperature (!) 97.5 F (36.4  C), temperature source Oral, resp. rate 16, weight 163 lb 9.6 oz (74.2 kg), SpO2 93 %.  LABORATORY DATA: Lab Results  Component Value Date   WBC 9.0 05/12/2022   HGB 17.0 05/12/2022   HCT 54.3 (H) 05/12/2022   MCV 84.7 05/12/2022   PLT 371 05/12/2022      Chemistry      Component Value Date/Time   NA 137 04/13/2022 2006   NA 143 10/18/2019 0836   K 4.0 04/13/2022 2006   CL 106 04/13/2022 2006   CO2 22 04/13/2022 2006   BUN 12 04/13/2022 2006   BUN 12 10/18/2019 0836   CREATININE 1.14 04/13/2022 2006   CREATININE 1.32 (H) 02/04/2022 0757  Component Value Date/Time   CALCIUM 8.8 (L) 04/13/2022 2006   ALKPHOS 77 04/13/2022 2006   AST 19 04/13/2022 2006   AST 17 02/04/2022 0757   ALT 12 04/13/2022 2006   ALT 12 02/04/2022 0757   BILITOT 1.3 (H) 04/13/2022 2006   BILITOT 0.6 02/04/2022 0757       RADIOGRAPHIC STUDIES: DG Chest Port 1 View  Result Date: 04/13/2022 CLINICAL DATA:  Weakness and shortness of breath EXAM: PORTABLE CHEST 1 VIEW COMPARISON:  CTA chest and radiographs 01/09/2021 FINDINGS: Stable cardiomediastinal silhouette. Aortic atherosclerotic calcification. Hyperinflation. No focal consolidation, pleural effusion, or pneumothorax. No acute osseous abnormality. IMPRESSION: No active disease. Electronically Signed   By: Minerva Fester M.D.   On: 04/13/2022 21:00   CT Head Wo Contrast  Result Date: 04/13/2022 CLINICAL DATA:  Malaise and weakness. EXAM: CT HEAD WITHOUT CONTRAST TECHNIQUE: Contiguous axial images were obtained from the base of the skull through the vertex without intravenous contrast. RADIATION DOSE REDUCTION: This exam was performed according to the departmental dose-optimization program which includes automated exposure control, adjustment of the mA and/or kV according to patient size and/or use of iterative reconstruction technique. COMPARISON:  April 30, 2021 FINDINGS: Brain: There is moderate severity cerebral atrophy with widening of the  extra-axial spaces and ventricular dilatation. There are areas of decreased attenuation within the white matter tracts of the supratentorial brain, consistent with microvascular disease changes. A chronic right parieto-occipital infarct is seen. Vascular: No hyperdense vessel or unexpected calcification. Skull: Normal. Negative for fracture or focal lesion. Sinuses/Orbits: There is marked severity bilateral ethmoid sinus mucosal thickening. Other: None. IMPRESSION: 1. No acute intracranial abnormality. 2. Chronic right parieto-occipital infarct. 3. Marked severity bilateral ethmoid sinus disease. Electronically Signed   By: Aram Candela M.D.   On: 04/13/2022 20:46    ASSESSMENT AND PLAN: This is a very pleasant 87 years old white male with recently diagnosed polycythemia vera with positive Jak 2 mutation.  The patient is also currently on hormonal therapy for androgen deficiency. He underwent phlebotomy on as-needed basis in the past.  He also had essential thrombocythemia. The patient started treatment with hydroxyurea 500 mg p.o. daily.  He was not compliant with his medication recently and he did not call for refill. His CBC today showed hemoglobin of 17.0 and hematocrit 54.3%.  I recommended for the patient to proceed with phlebotomy today and I will refill his hydroxyurea. He will come back for follow-up visit in 3 months for evaluation and repeat blood work and phlebotomy if needed. He was advised to call immediately if he has any concerning symptoms in the interval. The patient voices understanding of current disease status and treatment options and is in agreement with the current care plan. All questions were answered. The patient knows to call the clinic with any problems, questions or concerns. We can certainly see the patient much sooner if necessary.  Disclaimer: This note was dictated with voice recognition software. Similar sounding words can inadvertently be transcribed and may not be  corrected upon review.

## 2022-06-25 ENCOUNTER — Emergency Department (HOSPITAL_BASED_OUTPATIENT_CLINIC_OR_DEPARTMENT_OTHER)
Admission: EM | Admit: 2022-06-25 | Discharge: 2022-06-26 | Disposition: A | Discharge status: Home / Self Care | Payer: PPO | Attending: Emergency Medicine | Admitting: Emergency Medicine

## 2022-06-25 ENCOUNTER — Other Ambulatory Visit: Payer: Self-pay

## 2022-06-25 DIAGNOSIS — J449 Chronic obstructive pulmonary disease, unspecified: Secondary | ICD-10-CM | POA: Insufficient documentation

## 2022-06-25 DIAGNOSIS — Z79899 Other long term (current) drug therapy: Secondary | ICD-10-CM | POA: Diagnosis not present

## 2022-06-25 DIAGNOSIS — F0390 Unspecified dementia without behavioral disturbance: Secondary | ICD-10-CM | POA: Diagnosis not present

## 2022-06-25 DIAGNOSIS — R040 Epistaxis: Secondary | ICD-10-CM

## 2022-06-25 DIAGNOSIS — Z7982 Long term (current) use of aspirin: Secondary | ICD-10-CM | POA: Insufficient documentation

## 2022-06-25 MED ORDER — OXYMETAZOLINE HCL 0.05 % NA SOLN
1.0000 | Freq: Once | NASAL | Status: AC
  Administered 2022-06-25: 1 via NASAL
  Filled 2022-06-25: qty 30

## 2022-06-25 NOTE — ED Triage Notes (Signed)
Pt. and wife state he's had a nosebleed on and off x 2 days. Bleeding today for approx. . Pt. On blood thinners

## 2022-06-25 NOTE — ED Provider Notes (Signed)
[place] EMERGENCY DEPARTMENT AT [facility] Provider Note   CSN: [id number] Arrival date & time: 06/25/22  2251     History  Chief Complaint  Patient presents with   Epistaxis    Dean Pratt is a 87 y.o. male.  Patient is an 87 year old male with history of COPD, mild dementia, hyperlipidemia.  Patient presenting today with complaints of nosebleed.  This started yesterday, then resolved.  This evening he was blowing his nose, then the bleeding resumed.  He applied direct pressure and by the time he got here, he was developing a clot and bleeding has improved.  Patient is on 81 mg aspirin daily, but takes no other blood thinners.  The history is provided by the patient.       Home Medications Prior to Admission medications   Medication Sig Start Date End Date Taking? Authorizing Provider  acetaminophen (TYLENOL) 325 MG tablet Take 650 mg by mouth every 6 (six) hours as needed for mild pain.    [provider]  allopurinol (ZYLOPRIM) 100 MG tablet Take 100 mg by mouth at bedtime.      [provider]  Ascorbic Acid (VITAMIN C) 1000 MG tablet Take 1,000 mg by mouth daily.     [provider]  aspirin EC 81 MG tablet Take 1 tablet (81 mg total) by mouth daily. Swallow whole. 03/13/21   Micki Riley, MD  cycloSPORINE (RESTASIS) 0.05 % ophthalmic emulsion Place 1 drop into both eyes 2 (two) times daily as needed (dry eyes, irritation). 03/15/18   [provider]  diclofenac Sodium (VOLTAREN) 1 % GEL Apply 2-4 g topically 4 (four) times daily. Patient taking differently: Apply 2-4 g topically 4 (four) times daily as needed (arthritis pain). 09/15/19   Kathryne Hitch, MD  diltiazem (CARDIZEM CD) 240 MG 24 hr capsule TAKE 1 CAPSULE DAILY. PLEASE CALL OFFICE TO SCHEDULE APPOINTMENT FOR FURTHER REFILLS Patient taking differently: Take 240 mg by mouth daily. 05/25/19   Georgeanna Lea, MD  docusate sodium (COLACE) 100 MG capsule  Take 100 mg by mouth daily as needed for mild constipation. 01/20/11   [provider]  donepezil (ARICEPT) 10 MG tablet Take 1 tablet (10 mg total) by mouth at bedtime. 09/15/21   Ihor Austin, NP  famotidine (PEPCID) 20 MG tablet Take 20 mg by mouth at bedtime as needed for heartburn or indigestion. 05/16/20   [provider]  hydroxyurea (HYDREA) 500 MG capsule TAKE ONE CAPSULE BY MOUTH AT NOON 05/12/22   Si Gaul, MD  LORazepam (ATIVAN) 1 MG tablet Take 0.5-1 tablets (0.5-1 mg total) by mouth 2 (two) times daily as needed for anxiety. 01/14/21   Osvaldo Shipper, MD  losartan (COZAAR) 25 MG tablet Take 1 tablet (25 mg total) by mouth daily. TAKE 1 TABLET DAILY. Strength: 25 mg 04/03/22   Sheilah Pigeon, PA-C  memantine (NAMENDA) 10 MG tablet TAKE ONE TABLET BY MOUTH AT NOON and TAKE ONE TABLET BY MOUTH EVERYDAY AT BEDTIME 01/08/22   Micki Riley, MD  methocarbamol (ROBAXIN) 500 MG tablet Take 1 tablet (500 mg total) by mouth every 8 (eight) hours as needed for muscle spasms. 05/30/19   Tyrell Antonio, MD  Olopatadine HCl 0.2 % SOLN Place 1 drop into both eyes in the morning and at bedtime. 07/10/18   [provider]  OVER THE COUNTER MEDICATION Place 1 spray into both nostrils daily as needed (congestion). Congestion nasal spray    [provider]  pantoprazole (PROTONIX) 40 MG tablet Take 1 tablet (40 mg total) by mouth daily. 09/02/20   Hilarie Fredrickson, MD  rosuvastatin (CRESTOR) 10 MG tablet Take 10 mg by mouth daily. 09/06/17   [provider]  vitamin B-12 (CYANOCOBALAMIN) 1000 MCG tablet Take 1,000 mcg by mouth daily.    [provider]      Allergies    Ace inhibitors and Warfarin and related    Review of Systems   Review of Systems  All other systems reviewed and are negative.   Physical Exam Updated Vital Signs BP (!) 151/77 (BP Location: Right Arm)   Pulse 60   Resp 14   SpO2 97%  Physical Exam Vitals and nursing note  reviewed.  Constitutional:      General: He is not in acute distress.    Appearance: Normal appearance. He is not ill-appearing.  HENT:     Head: Normocephalic and atraumatic.     Nose:     Comments: There is a clot noted in the right nares which was removed.  Bleeding has stopped, with no obvious site of the bleeding.  Left nares is clear. Pulmonary:     Effort: Pulmonary effort is normal.  Skin:    General: Skin is warm and dry.  Neurological:     Mental Status: He is alert and oriented to person, place, and time.     ED Results / Procedures / Treatments   Labs (all labs ordered are listed, but only abnormal results are displayed) Labs Reviewed - No data to display  EKG None  Radiology No results found.  Procedures Procedures    Medications Ordered in ED Medications  oxymetazoline (AFRIN) 0.05 % nasal spray 1 spray (1 spray Each Nare Given by Other 06/25/22 2356)    ED Course/ Medical Decision Making/ A&P  Patient is a an 87 year old male presenting with nosebleed that began in the absence of any injury or trauma.  By the time he got here, he had controlled the bleeding with direct pressure.  Patient was given 2 sprays of Afrin and has been observed and had no further bleeding.  I feels that he can safely be discharged with Afrin if bleeding recurs, and return as needed if symptoms worsen.  Final Clinical Impression(s) / ED Diagnoses Final diagnoses:  None    Rx / DC Orders ED Discharge Orders     None         Geoffery Lyons, MD 06/26/22 0020

## 2022-06-26 NOTE — ED Notes (Signed)
No further bleeding noted. Pt. D/c'd home

## 2022-06-26 NOTE — Discharge Instructions (Signed)
If bleeding resumes, give 2 sprays of Afrin in the nostril, then hold direct pressure by pinching her nostrils shut for 15 minutes.  If this does not resolve the bleeding, repeat.  Return to the ER if you experience any additional problems.

## 2022-06-26 NOTE — ED Notes (Signed)
No bleeding at this time. 

## 2022-06-26 NOTE — ED Notes (Signed)
Ambulatory to restroom

## 2022-07-07 DIAGNOSIS — I7121 Aneurysm of the ascending aorta, without rupture: Secondary | ICD-10-CM | POA: Diagnosis not present

## 2022-07-07 DIAGNOSIS — G319 Degenerative disease of nervous system, unspecified: Secondary | ICD-10-CM | POA: Diagnosis not present

## 2022-07-07 DIAGNOSIS — I129 Hypertensive chronic kidney disease with stage 1 through stage 4 chronic kidney disease, or unspecified chronic kidney disease: Secondary | ICD-10-CM | POA: Diagnosis not present

## 2022-07-07 DIAGNOSIS — I6930 Unspecified sequelae of cerebral infarction: Secondary | ICD-10-CM | POA: Diagnosis not present

## 2022-07-07 DIAGNOSIS — R531 Weakness: Secondary | ICD-10-CM | POA: Diagnosis not present

## 2022-07-07 DIAGNOSIS — I480 Paroxysmal atrial fibrillation: Secondary | ICD-10-CM | POA: Diagnosis not present

## 2022-07-07 DIAGNOSIS — H53462 Homonymous bilateral field defects, left side: Secondary | ICD-10-CM | POA: Diagnosis not present

## 2022-07-07 DIAGNOSIS — R410 Disorientation, unspecified: Secondary | ICD-10-CM | POA: Diagnosis not present

## 2022-07-07 DIAGNOSIS — N182 Chronic kidney disease, stage 2 (mild): Secondary | ICD-10-CM | POA: Diagnosis not present

## 2022-07-07 DIAGNOSIS — D692 Other nonthrombocytopenic purpura: Secondary | ICD-10-CM | POA: Diagnosis not present

## 2022-07-07 DIAGNOSIS — I700 Atherosclerosis of aorta: Secondary | ICD-10-CM | POA: Diagnosis not present

## 2022-07-07 DIAGNOSIS — F03A0 Unspecified dementia, mild, without behavioral disturbance, psychotic disturbance, mood disturbance, and anxiety: Secondary | ICD-10-CM | POA: Diagnosis not present

## 2022-07-08 ENCOUNTER — Other Ambulatory Visit: Payer: Self-pay | Admitting: Adult Health

## 2022-07-10 ENCOUNTER — Other Ambulatory Visit: Payer: Self-pay | Admitting: Physician Assistant

## 2022-07-10 ENCOUNTER — Other Ambulatory Visit: Payer: Self-pay | Admitting: Adult Health

## 2022-08-03 ENCOUNTER — Other Ambulatory Visit: Payer: Self-pay | Admitting: Cardiology

## 2022-08-11 ENCOUNTER — Inpatient Hospital Stay: Payer: PPO | Attending: Internal Medicine

## 2022-08-11 ENCOUNTER — Inpatient Hospital Stay: Payer: PPO | Admitting: Internal Medicine

## 2022-08-11 ENCOUNTER — Inpatient Hospital Stay: Payer: PPO

## 2022-09-01 ENCOUNTER — Other Ambulatory Visit: Payer: Self-pay | Admitting: Cardiology

## 2022-09-03 ENCOUNTER — Encounter: Payer: Self-pay | Admitting: Internal Medicine

## 2022-09-08 NOTE — Progress Notes (Signed)
[facility] [address] [place]. [place] [address] [phone]       OFFICE FOLLOW-UP NOTE  [name] Date of Birth:  1935/03/13 Medical Record Number:  [id number]    Primary neurologist: Dr. Pearlean Pratt Reason for visit: Mild vascular dementia    Chief Complaint  Patient presents with   Follow-up    RM 3, here with wife Dean Pratt and son in law Dean Pratt Pt is here for vascular dementia follow up. Pt wife states pt is getting worse, states yesterday he didn't know what to do in order to take a shower. States he is forgetting a lot of things. Pt states when he is watching tv, the conversations he hears are "twice as fast".       HPI:  Update 09/09/2022 JM: Patient returns for follow-up visit regarding mild vascular dementia accompanied by his wife and son-in-law.   Reports cognition has gradually declined since prior visit. MMSE today 23/30 (prior 25/30). Per wife, he gets confused easily such as what needs to be done in order to take a shower and will get lost around their small apartment (will have difficulty finding the bathroom, lives in 2 bedroom apartment in retirement community), he forgets things easily and feels like when watching TV the conversations can be difficult to understand, feels like they are talking double (has a difficult time fully describing what he is experiencing). He can become agitated with his wife easily but usually only when she is trying to rush him to do something, contradict something that he says or tells him to do something he doesn't want to do.  At times can have word finding difficulty.  Remains on Aricept and Namenda.  Denies any hallucinations, paranoia or delusions.  Sleeps well and good appetite. Is overall sedentary during the day, stays inside his apartment and watches TV or sleeps.  Limited memory exercises.  He used to socialize around the retirement community where he lives but was getting lost so he no longer does this. Wife  becomes tearful during visit, she is his main caregiver, she is frustrated that she has no help from family. Denies new stroke/TIA symptoms.  Continued left-sided peripheral visual impairment.  Continues on aspirin and Crestor.  Routinely follows with PCP for stroke risk factor management.    History provided for reference purposes only Update 03/23/2022 Dr. Pearlean Pratt : He returns for follow-up after last visit with Dean Pratt nurse practitioner 6 months ago.  He is accompanied by his wife and son-in-law.  Patient feels that he is doing better after increasing the dose of Aricept to 10 mg daily.  He however has good days and bad days.  Gets tired more easily and tends spend a lot of time in sitting.  He does get disoriented and confused from time to time.  Patient is mostly independent in activities like dressing himself but he needs to changing clothes.  Patient is not allowed to drive and wife drives him around him.  Patient does do some sudoku with does not socialize a lot.  He denies any delusions or hallucinations but does tend to get anxious pretty easily particularly when faced with some changes.  Continues to have left-sided peripheral vision difficulties.He remains on aspirin for tolerating well without bruising or bleeding.  Tolerating Crestor well without side effects.  His blood pressure is well-controlled today it is 138/82.  He is tolerating Aricept 10 mg daily and Namenda 10 mg twice daily.   Update 09/15/2021 JM: Patient returns  per request due to decline of memory accompanied by his wife and son-in-law.  He was previously seen by Dr. Pearlean Pratt 4 months ago with reporting stable cognition.  Reports over the past 2 months, he has had gradual memory decline.  Can ask repeat questions, get confused easily and misplace items.  Denies any acute or abrupt worsening, no other associated neurological or stroke type symptoms. Son in law mentions that he has been actively participating in social events at his  independent living facility and does note improvement of memory.  Can become frustrated at times due to memory loss but no significant behavioral concerns. Wife provides 24/7 supervision, suspect some caregiver burnout, voices frustration that she may have to repeat herself or tell him over and over again how to find a contact in his cell phone. Family does visit routinely.  MMSE today 21/30 (prior visit 21/30).  Remains on Aricept 5 mg nightly and Namenda 10 mg twice daily.  Compliant on aspirin and Crestor.  Closely followed by PCP Dr. Timothy Pratt.  No further concerns at this time.  Update 05/05/2021 ; patient is seen for follow-up today after recent ER visit on 04/30/2021.  Patient on that day was looking at the picture and did not recognize his grandchild whom has seen many times.  He similarly could not remember the names of family members and he became quite anxious and frustrated because of this.  He had no trouble speaking and had no extremity weakness gait or balance problems.  His symptoms lasted for about an hour and recovered after he was in the ER.  Patient underwent MRI scan of the brain which showed no acute abnormality.  Patient's symptoms were felt to represent either a TIA versus transient global amnesia or unwitnessed seizure with postictal confusion.  Patient has done well since then and has had no further episodes.  At last visit with me I had lab work for reversible causes of memory loss which was all normal.  He was advised to follow-up with me in my clinic following this ER visit.  Continues to have mild memory and cognitive difficulties which appear to be unchanged as per his family.   His Mini-Mental status exam score today is 21/30 which is improved from 18/30 at last visit    Initial visit 03/10/2021 ;Dean Pratt is a 87 year old Caucasian male seen today for initial office visit following hospital admission for stroke in January 2023.  He is accompanied by his wife who provides most of the  history.  History is also obtained from review of electronic medical records and opossum reviewed pertinent available imaging films in PACS. Dean Pratt is a 87 y.o. male with PMH significant for BPH, CHF, hypertension, hyperlipidemia, atrial fibrillation not on anticoagulation due to prior history of intracranial bleed and cerebral amyloid angiopathy on imaging, history of migraines, who presents with episode of lightheadedness and passing out.  He reports that earlier yesterday he got up to look who is at the door and passed out. He reports feeling lightheaded for a couple hours prior to that. He hit his head on the hardwood floor and had a laceration which was repaired in the ED. No recent changes to his meds, does not eat a lot.  CTH demonstrated trace SAH along with a possible right maxillary sinus non displaced fracture. He had MRI Brain without contrast which was notable for a small acute stroke in the R parietal and frontal lobe.  MRI brain shows microhemorrhages on brain and  echo suggestive of possible cerebral amyloid angiopathy.  CT angiogram of the head and neck showed no LVO.  60% stenosis was noted in the right subclavian artery origin.  2D echo showed ejection fraction 50 to 55% with mild left ventricular hypertrophy.  LDL cholesterol is 39 mg percent.  Hemoglobin A1c was 5.2.  Patient was on aspirin 81 mg which she was changed to 325 mg.  Patient is currently living at home with his wife.  He is had significant decline in his cognitive function and memory.  He gets confused and disoriented easily.  He and his wife live in independent retirement home.  Patient gets confused and last usually.  On Mini-Mental status exam today scored 18/30.  He has never been on medication like Aricept and Namenda.  Wife is willing to try Namenda after discussed risk-benefit.     14 system review of systems is positive for those listed in HPI and all other systems negative  PMH:  Past Medical History:   Diagnosis Date   Anxiety    Blood transfusion without reported diagnosis    BPH (benign prostatic hyperplasia)    Cancer (HCC)    basal cell CA removed   Cataract    removed with lens implants    CHF (congestive heart failure) (HCC)    Colitis, ischemic (HCC)    secondary to ebolism from afib 1/10   Constipation    GERD (gastroesophageal reflux disease)    Gout    HTN (hypertension)    Hyperlipidemia    Intracranial bleed (HCC) 04/10/2010   Right occipital hematoma with a left homonymous hemianopsia    Migraines    Paroxysmal atrial fibrillation (HCC)    s/p PVI 04/09/09 and 10/17/10   Rosacea    Stroke (HCC) 04/08/2010   hemorragic, anticoagulation stopped at that time   Tuberculosis    s/p treatment 1964    Social History:  Social History   Socioeconomic History   Marital status: Married    Spouse name: Not on file   Number of children: Not on file   Years of education: Not on file   Highest education level: Not on file  Occupational History   Occupation: retired  Tobacco Use   Smoking status: Never   Smokeless tobacco: Never  Vaping Use   Vaping status: Never Used  Substance and Sexual Activity   Alcohol use: No   Drug use: No   Sexual activity: Not Currently  Other Topics Concern   Not on file  Social History Narrative   Pt lives in [place].    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  He is married and lives at home with his wife.  He used to smoke but quit  smoking many years ago.  Does get regular exercise.  No alcohol use.    Social Determinants of Health   Financial Resource Strain: Not on file  Food Insecurity: Not on file  Transportation Needs: Not on file  Physical Activity: Not on file  Stress: Not on file  Social Connections: Not on file  Intimate Partner Violence: Not on file    Medications:   Current Outpatient Medications on File Prior to Visit  Medication Sig Dispense Refill    acetaminophen (TYLENOL) 325 MG tablet Take 650 mg by mouth every 6 (six) hours as needed for mild pain.     allopurinol (ZYLOPRIM) 100 MG tablet Take 100 mg by mouth  at bedtime.       Ascorbic Acid (VITAMIN C) 1000 MG tablet Take 1,000 mg by mouth daily.      aspirin EC 81 MG tablet Take 1 tablet (81 mg total) by mouth daily. Swallow whole. 30 tablet 11   cycloSPORINE (RESTASIS) 0.05 % ophthalmic emulsion Place 1 drop into both eyes 2 (two) times daily as needed (dry eyes, irritation).     diclofenac Sodium (VOLTAREN) 1 % GEL Apply 2-4 g topically 4 (four) times daily. (Patient taking differently: Apply 2-4 g topically 4 (four) times daily as needed (arthritis pain).) 200 g 3   diltiazem (CARDIZEM CD) 240 MG 24 hr capsule TAKE 1 CAPSULE DAILY. PLEASE CALL OFFICE TO SCHEDULE APPOINTMENT FOR FURTHER REFILLS (Patient taking differently: Take 240 mg by mouth daily.) 15 capsule 0   docusate sodium (COLACE) 100 MG capsule Take 100 mg by mouth daily as needed for mild constipation.     donepezil (ARICEPT) 10 MG tablet TAKE ONE TABLET BY MOUTH EVERYDAY AT BEDTIME 90 tablet 1   famotidine (PEPCID) 20 MG tablet Take 20 mg by mouth at bedtime as needed for heartburn or indigestion.     hydroxyurea (HYDREA) 500 MG capsule TAKE ONE CAPSULE BY MOUTH AT NOON 90 capsule 2   LORazepam (ATIVAN) 1 MG tablet Take 0.5-1 tablets (0.5-1 mg total) by mouth 2 (two) times daily as needed for anxiety. 25 tablet 0   losartan (COZAAR) 25 MG tablet Take 1 tablet (25 mg total) by mouth daily. Please schedule appointment for further refills. 3rd and final attempt. 15 tablet 0   memantine (NAMENDA) 10 MG tablet TAKE ONE TABLET BY MOUTH AT NOON and TAKE ONE TABLET BY MOUTH EVERYDAY AT BEDTIME 180 tablet 2   methocarbamol (ROBAXIN) 500 MG tablet Take 1 tablet (500 mg total) by mouth every 8 (eight) hours as needed for muscle spasms. 60 tablet 2   Olopatadine HCl 0.2 % SOLN Place 1 drop into both eyes in the morning and at bedtime.      OVER THE COUNTER MEDICATION Place 1 spray into both nostrils daily as needed (congestion). Congestion nasal spray     pantoprazole (PROTONIX) 40 MG tablet Take 1 tablet (40 mg total) by mouth daily. 90 tablet 3   rosuvastatin (CRESTOR) 10 MG tablet Take 10 mg by mouth daily.  2   vitamin B-12 (CYANOCOBALAMIN) 1000 MCG tablet Take 1,000 mcg by mouth daily.     No current facility-administered medications on file prior to visit.    Allergies:   Allergies  Allergen Reactions   Ace Inhibitors Cough   Warfarin And Related Other (See Comments)    Intercranial bleed    Physical Exam Today's Vitals   09/09/22 1518  BP: 107/64  Pulse: 66  Weight: 166 lb (75.3 kg)  Height: 6\' 1"  (1.854 m)   Body mass index is 21.9 kg/m.  General: well developed, well nourished very pleasant elderly Caucasian male, seated, in no evident distress Head: head normocephalic and atraumatic.  Neck: supple with no carotid or supraclavicular bruits Cardiovascular: irregular rate and rhythm, no murmurs Musculoskeletal: no deformity Skin:  no rash/petichiae Vascular:  Normal pulses all extremities  Neurologic Exam Mental Status: Awake and fully alert.  Intermittent speech hesitancy, no clear evidence of aphasia or dysarthria.   Recent and remote memory poor. Attention span, concentration and fund of knowledge impaired     03/23/2022    1:10 PM 09/15/2021    2:05 PM 05/05/2021   12:08 PM  MMSE - Mini Mental State Exam  Orientation to time 4 2 3   Orientation to Place 5 5 5   Registration 3 3 3   Attention/ Calculation 3 1 1   Recall 2 2 1   Language- name 2 objects 2 2 2   Language- repeat 1 1 1   Language- follow 3 step command 3 3 3   Language- read & follow direction 1 1 1   Write a sentence 1 1 1   Copy design 0 0 0  Total score 25 21 21    Cranial Nerves: Pupils equal, briskly reactive to light. Extraocular movements full without nystagmus. Visual fields full to confrontation. Hearing diminished bilaterally.  Facial sensation intact. Face, tongue, palate moves normally and symmetrically.  Motor: Normal bulk and tone. Normal strength in all tested extremity muscles. Sensory.: intact to touch ,pinprick .position and vibratory sensation.  Coordination: Rapid alternating movements normal in all extremities. Finger-to-nose and heel-to-shin performed accurately bilaterally. Gait and Station: Arises from chair without difficulty. Stance is normal. Gait demonstrates normal stride length and balance with use of cane.  Reflexes: 1+ and symmetric. Toes downgoing.       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  He does have  memory and cognitive decline likely from underlying dementia which seems to be progressive    -Long discussion with patient and family regarding progression of dementia.  MMSE testing showing mild impairment but based on symptoms reported by wife, likely more moderate range.  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  Was provided information today regarding community resources as well as information regarding [facility]. Also advised to contact insurance to see if home aide assistance or respite care is covered  -Discussed importance of increasing physical and cognitive activity, ensuring healthy diet, adequate sleep and routine socialization -Continue aspirin and Crestor for secondary stroke prevention measures -Continue to follow with PCP for aggressive stroke risk factor management     Follow-up in 6 months or call  earlier if needed    I spent a prolonged 50 minutes of face-to-face and non-face-to-face time with patient and family.  This included previsit chart review, lab review, study review, order entry, electronic health record documentation, patient and family education and prolonged discussion regarding memory decline likely progressive dementia and further treatment options and evaluation, further information as noted above and answered all the questions to patient and family satisfaction  Ihor Austin, [facility]  [facility] [address] [place], [place] [address]  Phone [phone] Fax [phone] Note: This document was prepared with digital dictation and possible smart phrase technology. Any transcriptional errors that result from this process are unintentional.  CC:  [facility] provider: [name], MD

## 2022-09-09 ENCOUNTER — Encounter: Payer: Self-pay | Admitting: Adult Health

## 2022-09-09 ENCOUNTER — Ambulatory Visit: Payer: PPO | Admitting: Adult Health

## 2022-09-09 ENCOUNTER — Encounter: Payer: Self-pay | Admitting: Internal Medicine

## 2022-09-09 VITALS — BP 107/64 | HR 66 | Ht 73.0 in | Wt 166.0 lb

## 2022-09-09 DIAGNOSIS — F0150 Vascular dementia without behavioral disturbance: Secondary | ICD-10-CM | POA: Diagnosis not present

## 2022-09-09 DIAGNOSIS — Z8673 Personal history of transient ischemic attack (TIA), and cerebral infarction without residual deficits: Secondary | ICD-10-CM

## 2022-09-09 NOTE — Patient Instructions (Addendum)
Continue Aricept and Namenda at current dosages  Highly recommend reviewing information provided regarding community resources for further assistance  Continue aspirin and Crestor for secondary stroke prevention measures    Follow-up in 6 months or call earlier if needed    Thank you for coming to see [place] at [facility]. I hope we have been able to provide you high quality care today.  You may receive a patient satisfaction survey over the next few weeks. We would appreciate your feedback and comments so that we may continue to improve ourselves and the health of our patients.

## 2022-09-15 ENCOUNTER — Telehealth: Payer: Self-pay | Admitting: Adult Health

## 2022-09-15 NOTE — Telephone Encounter (Signed)
Neuropsych referral faxed to [facility] (phone# [phone], fax# [phone])

## 2022-09-15 NOTE — Telephone Encounter (Signed)
I printed this and handed it to Zella Ball to address with [facility]. Lorayne Marek, RN

## 2022-09-25 ENCOUNTER — Other Ambulatory Visit: Payer: Self-pay

## 2022-09-28 ENCOUNTER — Inpatient Hospital Stay: Payer: PPO | Attending: Internal Medicine

## 2022-09-28 ENCOUNTER — Emergency Department (HOSPITAL_COMMUNITY)
Admission: EM | Admit: 2022-09-28 | Discharge: 2022-09-28 | Disposition: A | Discharge status: Home / Self Care | Payer: PPO | Attending: Emergency Medicine | Admitting: Emergency Medicine

## 2022-09-28 ENCOUNTER — Emergency Department (HOSPITAL_COMMUNITY): Payer: PPO

## 2022-09-28 ENCOUNTER — Telehealth: Payer: Self-pay

## 2022-09-28 ENCOUNTER — Inpatient Hospital Stay (HOSPITAL_BASED_OUTPATIENT_CLINIC_OR_DEPARTMENT_OTHER): Payer: PPO | Admitting: Internal Medicine

## 2022-09-28 ENCOUNTER — Other Ambulatory Visit: Payer: Self-pay

## 2022-09-28 ENCOUNTER — Inpatient Hospital Stay: Payer: PPO

## 2022-09-28 VITALS — BP 129/73 | HR 60 | Temp 97.5°F | Resp 16 | Ht 73.0 in | Wt 162.8 lb

## 2022-09-28 DIAGNOSIS — R410 Disorientation, unspecified: Secondary | ICD-10-CM | POA: Diagnosis not present

## 2022-09-28 DIAGNOSIS — R4182 Altered mental status, unspecified: Secondary | ICD-10-CM | POA: Insufficient documentation

## 2022-09-28 DIAGNOSIS — D45 Polycythemia vera: Secondary | ICD-10-CM

## 2022-09-28 DIAGNOSIS — Z7982 Long term (current) use of aspirin: Secondary | ICD-10-CM | POA: Insufficient documentation

## 2022-09-28 DIAGNOSIS — R7309 Other abnormal glucose: Secondary | ICD-10-CM | POA: Insufficient documentation

## 2022-09-28 DIAGNOSIS — I10 Essential (primary) hypertension: Secondary | ICD-10-CM | POA: Diagnosis not present

## 2022-09-28 DIAGNOSIS — R9082 White matter disease, unspecified: Secondary | ICD-10-CM | POA: Diagnosis not present

## 2022-09-28 LAB — CBC
HCT: 51.1 % (ref 39.0–52.0)
Hemoglobin: 15.9 g/dL (ref 13.0–17.0)
MCH: 27.3 pg (ref 26.0–34.0)
MCHC: 31.1 g/dL (ref 30.0–36.0)
MCV: 87.8 fL (ref 80.0–100.0)
Platelets: 315 10*3/uL (ref 150–400)
RBC: 5.82 MIL/uL — ABNORMAL HIGH (ref 4.22–5.81)
RDW: 15.6 % — ABNORMAL HIGH (ref 11.5–15.5)
WBC: 7.8 10*3/uL (ref 4.0–10.5)
nRBC: 0 % (ref 0.0–0.2)

## 2022-09-28 LAB — URINALYSIS, W/ REFLEX TO CULTURE (INFECTION SUSPECTED)
Bacteria, UA: NONE SEEN
Bilirubin Urine: NEGATIVE
Glucose, UA: NEGATIVE mg/dL
Hgb urine dipstick: NEGATIVE
Ketones, ur: NEGATIVE mg/dL
Leukocytes,Ua: NEGATIVE
Nitrite: NEGATIVE
Protein, ur: NEGATIVE mg/dL
Specific Gravity, Urine: 1.023 (ref 1.005–1.030)
pH: 5 (ref 5.0–8.0)

## 2022-09-28 LAB — COMPREHENSIVE METABOLIC PANEL
ALT: 19 U/L (ref 0–44)
AST: 20 U/L (ref 15–41)
Albumin: 3.8 g/dL (ref 3.5–5.0)
Alkaline Phosphatase: 71 U/L (ref 38–126)
Anion gap: 8 (ref 5–15)
BUN: 16 mg/dL (ref 8–23)
CO2: 24 mmol/L (ref 22–32)
Calcium: 9.2 mg/dL (ref 8.9–10.3)
Chloride: 108 mmol/L (ref 98–111)
Creatinine, Ser: 1.12 mg/dL (ref 0.61–1.24)
GFR, Estimated: >60 mL/min (ref >60)
Glucose, Bld: 100 mg/dL — ABNORMAL HIGH (ref 70–99)
Potassium: 3.9 mmol/L (ref 3.5–5.1)
Sodium: 140 mmol/L (ref 135–145)
Total Bilirubin: 1 mg/dL (ref 0.3–1.2)
Total Protein: 6.6 g/dL (ref 6.5–8.1)

## 2022-09-28 LAB — CBC WITH DIFFERENTIAL (CANCER CENTER ONLY)
Abs Immature Granulocytes: 0.04 10*3/uL (ref 0.00–0.07)
Basophils Absolute: 0.1 10*3/uL (ref 0.0–0.1)
Basophils Relative: 2 %
Eosinophils Absolute: 0.2 10*3/uL (ref 0.0–0.5)
Eosinophils Relative: 2 %
HCT: 51 % (ref 39.0–52.0)
Hemoglobin: 16.3 g/dL (ref 13.0–17.0)
Immature Granulocytes: 1 %
Lymphocytes Relative: 14 %
Lymphs Abs: 1.1 10*3/uL (ref 0.7–4.0)
MCH: 27.7 pg (ref 26.0–34.0)
MCHC: 32 g/dL (ref 30.0–36.0)
MCV: 86.7 fL (ref 80.0–100.0)
Monocytes Absolute: 0.7 10*3/uL (ref 0.1–1.0)
Monocytes Relative: 9 %
Neutro Abs: 5.8 10*3/uL (ref 1.7–7.7)
Neutrophils Relative %: 72 %
Platelet Count: 336 10*3/uL (ref 150–400)
RBC: 5.88 MIL/uL — ABNORMAL HIGH (ref 4.22–5.81)
RDW: 15.5 % (ref 11.5–15.5)
WBC Count: 8 10*3/uL (ref 4.0–10.5)
nRBC: 0 % (ref 0.0–0.2)

## 2022-09-28 LAB — CMP (CANCER CENTER ONLY)
ALT: 15 U/L (ref 0–44)
AST: 18 U/L (ref 15–41)
Albumin: 4.2 g/dL (ref 3.5–5.0)
Alkaline Phosphatase: 75 U/L (ref 38–126)
Anion gap: 5 (ref 5–15)
BUN: 16 mg/dL (ref 8–23)
CO2: 28 mmol/L (ref 22–32)
Calcium: 9.7 mg/dL (ref 8.9–10.3)
Chloride: 109 mmol/L (ref 98–111)
Creatinine: 1.34 mg/dL — ABNORMAL HIGH (ref 0.61–1.24)
GFR, Estimated: 52 mL/min — ABNORMAL LOW (ref >60)
Glucose, Bld: 95 mg/dL (ref 70–99)
Potassium: 4.1 mmol/L (ref 3.5–5.1)
Sodium: 142 mmol/L (ref 135–145)
Total Bilirubin: 0.7 mg/dL (ref 0.3–1.2)
Total Protein: 6.5 g/dL (ref 6.5–8.1)

## 2022-09-28 LAB — LACTATE DEHYDROGENASE: LDH: 123 U/L (ref 98–192)

## 2022-09-28 LAB — CBG MONITORING, ED: Glucose-Capillary: 101 mg/dL — ABNORMAL HIGH (ref 70–99)

## 2022-09-28 NOTE — Discharge Instructions (Signed)
Alzheimer's Resources  FREE  1) Alzheimer's Association 24/7 Helpline (1.(712)617-1157) General guidance on legal, financial, and care options LimitLaws.hu  2) Senior Resources at [employer] 4180226581) Case assistance, Meals on Wheels, Armed forces logistics/support/administrative officer www.senior-resources-[place].org  FEE BASED SERVICES  1) [facility] [employer] (630)[phone]) Senior Care Navigation Services, Legal/Financial Guidance Www.elderlawfirm.com  2) [facility] 208 542 3629) Senior Care Navigation Services, Legal/Financial Guidance Www.navigateseniorcare.com  Day Care 1) [facility] 339-573-0280) Half Day and Full Day Porgrams [facility].com  Personal Caregiving 2) [facility] [phone]) 20 hours/week up to 24/7 or live-in Private Pay or Long Term Care insurance accepted [email or web address]

## 2022-09-28 NOTE — ED Provider Notes (Signed)
[place] [facility]ERGENCY DEPARTMENT AT [facility] Provider Note   CSN: [id number] Arrival date & time: 09/28/22  1038     History  Chief Complaint  Patient presents with   crawling sensation   Altered Mental Status    Dean Pratt is a 87 y.o. male.  This is an 87 year old male here today for sensation of things crawling on his arms, and occasional body jerking.  Patient has a history of polycythemia vera, gets frequent phlebotomy, takes hydroxyurea.  He was at an appointment at the cancer center today, and his family was concerned that he may be having a stroke.  I reviewed the patient's oncologist note, no lateralizing or focal deficits were observed on their exam.  Recommended to go to the emergency department.  I reviewed the patient's medication list.  Patient lives at home with his wife.   Altered Mental Status      Home Medications Prior to Admission medications   Medication Sig Start Date End Date Taking? Authorizing Provider  acetaminophen (TYLENOL) 325 MG tablet Take 650 mg by mouth every 6 (six) hours as needed for mild pain.    [provider]  allopurinol (ZYLOPRIM) 100 MG tablet Take 100 mg by mouth at bedtime.      [provider]  Ascorbic Acid (VITAMIN C) 1000 MG tablet Take 1,000 mg by mouth daily.     [provider]  aspirin EC 81 MG tablet Take 1 tablet (81 mg total) by mouth daily. Swallow whole. 03/13/21   Micki Riley, MD  cycloSPORINE (RESTASIS) 0.05 % ophthalmic emulsion Place 1 drop into both eyes 2 (two) times daily as needed (dry eyes, irritation). 03/15/18   [provider]  diclofenac Sodium (VOLTAREN) 1 % GEL Apply 2-4 g topically 4 (four) times daily. Patient taking differently: Apply 2-4 g topically 4 (four) times daily as needed (arthritis pain). 09/15/19   Kathryne Hitch, MD  diltiazem (CARDIZEM CD) 240 MG 24 hr capsule TAKE 1 CAPSULE DAILY. PLEASE CALL OFFICE TO SCHEDULE APPOINTMENT FOR  FURTHER REFILLS Patient taking differently: Take 240 mg by mouth daily. 05/25/19   Georgeanna Lea, MD  docusate sodium (COLACE) 100 MG capsule Take 100 mg by mouth daily as needed for mild constipation. 01/20/11   [provider]  donepezil (ARICEPT) 10 MG tablet TAKE ONE TABLET BY MOUTH EVERYDAY AT BEDTIME 07/13/22   Ihor Austin, NP  famotidine (PEPCID) 20 MG tablet Take 20 mg by mouth at bedtime as needed for heartburn or indigestion. 05/16/20   [provider]  hydroxyurea (HYDREA) 500 MG capsule TAKE ONE CAPSULE BY MOUTH AT NOON 05/12/22   Si Gaul, MD  LORazepam (ATIVAN) 1 MG tablet Take 0.5-1 tablets (0.5-1 mg total) by mouth 2 (two) times daily as needed for anxiety. 01/14/21   Osvaldo Shipper, MD  losartan (COZAAR) 25 MG tablet Take 1 tablet (25 mg total) by mouth daily. Please schedule appointment for further refills. 3rd and final attempt. 08/04/22   Georgeanna Lea, MD  memantine (NAMENDA) 10 MG tablet TAKE ONE TABLET BY MOUTH AT NOON and TAKE ONE TABLET BY MOUTH EVERYDAY AT BEDTIME 01/08/22   Micki Riley, MD  methocarbamol (ROBAXIN) 500 MG tablet Take 1 tablet (500 mg total) by mouth every 8 (eight) hours as needed for muscle spasms. 05/30/19   Tyrell Antonio, MD  Olopatadine HCl 0.2 % SOLN Place 1 drop into both eyes in the morning and at bedtime. 07/10/18   [provider]  OVER THE COUNTER MEDICATION Place 1 spray into both nostrils daily as needed (congestion). Congestion nasal spray    [provider]  pantoprazole (PROTONIX) 40 MG tablet Take 1 tablet (40 mg total) by mouth daily. 09/02/20   Hilarie Fredrickson, MD  rosuvastatin (CRESTOR) 10 MG tablet Take 10 mg by mouth daily. 09/06/17   [provider]  vitamin B-12 (CYANOCOBALAMIN) 1000 MCG tablet Take 1,000 mcg by mouth daily.    [provider]      Allergies    Ace inhibitors and Warfarin and related    Review of Systems   Review of Systems  Physical Exam Updated  Vital Signs BP 117/83   Pulse 66   Temp 97.7 F (36.5 C) (Oral)   Resp 19   SpO2 95%  Physical Exam Constitutional:      Appearance: Normal appearance.  Cardiovascular:     Rate and Rhythm: Normal rate and regular rhythm.  Neurological:     Mental Status: He is alert.     Cranial Nerves: No cranial nerve deficit.     Sensory: No sensory deficit.     Motor: No weakness.     Coordination: Coordination normal.     Gait: Gait normal.  Psychiatric:        Mood and Affect: Mood normal.     ED Results / Procedures / Treatments   Labs (all labs ordered are listed, but only abnormal results are displayed) Labs Reviewed  CBC  COMPREHENSIVE METABOLIC PANEL  URINALYSIS, W/ REFLEX TO CULTURE (INFECTION SUSPECTED)  CBG MONITORING, ED    EKG EKG Interpretation Date/Time:  Monday September 28 2022 10:50:29 EDT Ventricular Rate:  57 PR Interval:    QRS Duration:  106 QT Interval:  504 QTC Calculation: 491 R Axis:   69  Text Interpretation: Atrial fibrillation Anterior infarct, old Confirmed by Anders Simmonds [phone]) on 09/28/2022 10:57:25 AM  Radiology No results found.  Procedures Procedures    Medications Ordered in ED Medications - No data to display  ED Course/ Medical Decision Making/ A&P                                 Medical Decision Making This is an 87 year old male here today for unusual sensation in his arms, reportedly increased f forgetfulness, labile mood.  Differential diagnoses include dementia, metabolic abnormalities, considered CVA, less likely infectious.  Plan- patient has no neurological deficits on exam.  This pleasant, cooperative.  He does seem somewhat forgetful.  I see that this patient is on donepezil and memantine, may be related to his dementia.  Will reach family to see what the patient's baseline is, see if there is any collateral to be gained.  Reassessment-patient's wife and son came by.  They stated that the patient has been  struggling with dementia since his stroke in 2023.  They say that over the last several weeks, the patient has become increasingly emotionally labile at times.  He has also been more forgetful.  I discussed with the family how if the patient with dementia, in the emergency department our goal is to assess for acute, and fixable causes.  They were very understanding of this.  My dependent review the patient's head CT shows no intracranial hemorrhage.  My dependent review the patient's EKG shows no ST segment depression or elevation, no T wave inversions, rate controlled atrial fibrillation.  Reviewed the patient's  labs, no leukocytosis, no anemia.  Urinalysis negative for infection.  Social work provided family with some additional resources.  They feel safe with the patient in their senior living community where they live in independent living.  Will discharge home.    Amount and/or Complexity of Data Reviewed Labs: ordered. Radiology: ordered.           Final Clinical Impression(s) / ED Diagnoses Final diagnoses:  None    Rx / DC Orders ED Discharge Orders     None         Arletha Pili, DO 09/28/22 1456

## 2022-09-28 NOTE — ED Triage Notes (Addendum)
Pt arrived from the Cx center. Pt was concerned they were having a stroke. MD at Cx center saw no focal deficits. Pt's main complaints are sensation that things are crawling up both of their arms, and occasional upper body twitching.  Pt slow to respond, but no major focal deficits noted in triage.  Hx dementia  AOx3

## 2022-09-28 NOTE — Care Management (Addendum)
Transition of Care [facility]) - Emergency Department Mini Assessment   Patient Details  Name: Dean Pratt MRN: [id number] Date of Birth: September 17, 1935  Transition of Care [facility]) CM/SW Contact:    Lavenia Atlas, RN Phone Number: 09/28/2022, 3:26 PM   Clinical Narrative: Received TOC consult for [facility] services. This RNCM spoke with patient's wife Eber Jones and son-in law at bedside. Wife requesting assistance with placing patient in memory care. Wife Eber Jones reports she needs help and is agreeable to referral for [facility]. This RNCM provided ALZ resources and sent referral to [facility] w/[facility]. This RNCM encouraged patient's wife to reach out to private duty agencies for assistance with care at home, as wife understands this is an out of pocket expense.  Patient was discharged before this RNCM was able to add private duty nursing agencies to AVS. - 4:14 pm Whitney notified this RNCM that she will follow up with patient's family for assistance with memory care.   No additional TOC needs at this time.   ED Mini Assessment: What brought you to the Emergency Department? : Pt arrived from the Cx center. Pt was concerned they were having a stroke.  Barriers to Discharge: No Barriers Identified  Barrier interventions: provided resources  Means of departure: Car  Interventions which prevented an admission or readmission: Other (must enter comment) (Memory Care and [facility]Z  resources)    Patient Contact and Communications Key Contact 1: Manmeet Mcquaide (wife)      ,          Patient states their goals for this hospitalization and ongoing recovery are:: wife reports needs help with respite/ memory care CMS Medicare.gov Compare Post Acute Care list provided to:: Patient Represenative (must comment) Choice offered to / list presented to : Spouse  Admission diagnosis:  Ca Pt; [place] r/o Patient Active Problem List   Diagnosis Date Noted   Debility 01/12/2021   Cerebral embolism with  cerebral infarction 01/10/2021   CVA (cerebral vascular accident) (HCC) 01/10/2021   Syncope and collapse 01/09/2021   Subarachnoid hemorrhage following injury with brief loss of consciousness but without open intracranial wound (HCC) 01/09/2021   Maxillary sinus fracture, closed, initial encounter (HCC) 01/09/2021   COPD (chronic obstructive pulmonary disease) (HCC) 01/09/2021   Thoracic ascending aortic aneurysm (HCC) 01/09/2021   Pulmonary nodules/lesions, multiple 01/09/2021   Thyroid nodule greater than or equal to 1 cm in diameter incidentally noted on imaging study 01/09/2021   Dementia without behavioral disturbance (HCC) 01/09/2021   Paraspinal soft tissue mass 12/15/2020   Scoliosis of thoracolumbar spine 07/25/2019   Spondylosis without myelopathy or radiculopathy, lumbar region 07/25/2019   Status post ablation of atrial fibrillation 12/24/2017   Polycythemia vera (HCC) 04/13/2017   OTHER AND UNSPECIFIED COAGULATION DEFECTS 01/02/2010   ATRIAL FIBRILLATION 01/02/2010   SNORING 02/27/2009   ISCHEMIC COLITIS 03/27/2008   ABDOMINAL PAIN-RLQ 03/22/2008   ABNORMAL FINDINGS GI TRACT 03/22/2008   PERSONAL HX COLONIC POLYPS 03/22/2008   COLONIC POLYPS 03/21/2008   Dyslipidemia 03/21/2008   GOUT 03/21/2008   ANXIETY 03/21/2008   MIGRAINE HEADACHE 03/21/2008   CONSTIPATION, CHRONIC 03/21/2008   ROSACEA 03/21/2008   Essential hypertension 03/21/2008   CARCINOMA, BASAL CELL, HX OF 03/21/2008   TUBERCULOSIS, HX OF 03/21/2008   ATRIAL FIBRILLATION, HX OF 03/21/2008   BPH (benign prostatic hyperplasia) 03/21/2008   PCP:  [name], MD Pharmacy:   [facility] - [place], [place] - [address]  [address] [place] [address] Phone: [phone] Fax: [phone]  [facility] - [place], [place] - [address] [place] [address] Phone: [phone] Fax: [phone]  [facility] -  [address], [place] - [address] [place] [address] Phone: [phone] Fax: [phone]  [id number] - [place], [place] - [address] [place] [address] Phone: [phone] Fax: [phone]

## 2022-09-28 NOTE — Progress Notes (Signed)
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    PRIOR THERAPY: Phlebotomy on as-needed basis  CURRENT THERAPY: Hydroxyurea 500 mg p.o. daily and aspirin 81 mg p.o. daily.  INTERVAL HISTORY: Dean Pratt 87 y.o. male Discussed the use of AI scribe software for clinical note transcription with the patient, who gave verbal consent to proceed.  History of Present Illness   [name], an 87 year old patient with a history of polycythemia vera, presents with a week and a half history of altered mental status. The patient's family reports that he has been increasingly confused, forgetful, and emotionally labile, often crying. He has also been experiencing generalized weakness and fatigue, with a decreased appetite and increased somnolence. The patient himself reports feeling a bit weak but denies any chest pain or respiratory issues.  In addition to these symptoms, the patient has been experiencing recurrent nosebleeds, approximately once a month, with a particularly severe episode lasting two hours the day prior to the consultation. The patient's family also reports that he has been experiencing auditory hallucinations, hearing voices praising him.  The patient's current medication regimen includes hydroxyurea for his polycythemia vera and daily aspirin. The family is concerned about the possibility of a stroke given the patient's recent changes in behavior and mental status. The patient's hematocrit is slightly elevated at 51, with a hemoglobin of 16.3. The patient denies any focal weakness or other symptoms suggestive of a stroke.         MEDICAL HISTORY: Past Medical History:  Diagnosis Date   Anxiety    Blood transfusion without reported diagnosis    BPH (benign prostatic hyperplasia)    Cancer (HCC)     basal cell CA removed   Cataract    removed with lens implants    CHF (congestive heart failure) (HCC)    Colitis, ischemic (HCC)    secondary to ebolism from afib 1/10   Constipation    GERD (gastroesophageal reflux disease)    Gout    HTN (hypertension)    Hyperlipidemia    Intracranial bleed (HCC) 04/10/2010   Right occipital hematoma with a left homonymous hemianopsia    Migraines    Paroxysmal atrial fibrillation (HCC)    s/p PVI 04/09/09 and 10/17/10   Rosacea    Stroke (HCC) 04/08/2010   hemorragic, anticoagulation stopped at that time   Tuberculosis    s/p treatment 1964    ALLERGIES:  is allergic to ace inhibitors and warfarin and related.  MEDICATIONS:  Current Outpatient Medications  Medication Sig Dispense Refill   acetaminophen (TYLENOL) 325 MG tablet Take 650 mg by mouth every 6 (six) hours as needed for mild pain.     allopurinol (ZYLOPRIM) 100 MG tablet Take 100 mg by mouth at bedtime.       Ascorbic Acid (VITAMIN C) 1000 MG tablet Take 1,000 mg by mouth daily.      aspirin EC 81 MG tablet Take 1 tablet (81 mg total) by mouth daily. Swallow whole. 30 tablet 11   cycloSPORINE (RESTASIS) 0.05 % ophthalmic emulsion Place 1 drop into both eyes 2 (two) times daily as needed (dry eyes, irritation).     diclofenac Sodium (VOLTAREN) 1 % GEL Apply 2-4 g topically 4 (four) times daily. (Patient taking differently: Apply 2-4 g topically 4 (four) times daily as needed (  arthritis pain).) 200 g 3   diltiazem (CARDIZEM CD) 240 MG 24 hr capsule TAKE 1 CAPSULE DAILY. PLEASE CALL OFFICE TO SCHEDULE APPOINTMENT FOR FURTHER REFILLS (Patient taking differently: Take 240 mg by mouth daily.) 15 capsule 0   docusate sodium (COLACE) 100 MG capsule Take 100 mg by mouth daily as needed for mild constipation.     donepezil (ARICEPT) 10 MG tablet TAKE ONE TABLET BY MOUTH EVERYDAY AT BEDTIME 90 tablet 1   famotidine (PEPCID) 20 MG tablet Take 20 mg by mouth at bedtime as needed for heartburn or  indigestion.     hydroxyurea (HYDREA) 500 MG capsule TAKE ONE CAPSULE BY MOUTH AT NOON 90 capsule 2   LORazepam (ATIVAN) 1 MG tablet Take 0.5-1 tablets (0.5-1 mg total) by mouth 2 (two) times daily as needed for anxiety. 25 tablet 0   losartan (COZAAR) 25 MG tablet Take 1 tablet (25 mg total) by mouth daily. Please schedule appointment for further refills. 3rd and final attempt. 15 tablet 0   memantine (NAMENDA) 10 MG tablet TAKE ONE TABLET BY MOUTH AT NOON and TAKE ONE TABLET BY MOUTH EVERYDAY AT BEDTIME 180 tablet 2   methocarbamol (ROBAXIN) 500 MG tablet Take 1 tablet (500 mg total) by mouth every 8 (eight) hours as needed for muscle spasms. 60 tablet 2   Olopatadine HCl 0.2 % SOLN Place 1 drop into both eyes in the morning and at bedtime.     OVER THE COUNTER MEDICATION Place 1 spray into both nostrils daily as needed (congestion). Congestion nasal spray     pantoprazole (PROTONIX) 40 MG tablet Take 1 tablet (40 mg total) by mouth daily. 90 tablet 3   rosuvastatin (CRESTOR) 10 MG tablet Take 10 mg by mouth daily.  2   vitamin B-12 (CYANOCOBALAMIN) 1000 MCG tablet Take 1,000 mcg by mouth daily.     No current facility-administered medications for this visit.    SURGICAL HISTORY:  Past Surgical History:  Procedure Laterality Date   ablation  04/09/2009   s/p afib and atrial flutter ablation by JA   ABLATION OF DYSRHYTHMIC FOCUS  10/15/09   repeat afib ablation by JA   APPENDECTOMY     CATARACT EXTRACTION     CATARACT EXTRACTION, BILATERAL     with lens implants    COLONOSCOPY     CORNEAL LACERATION REPAIR     EYE SURGERY     INGUINAL HERNIA REPAIR     MASS EXCISION Left 12/20/2020   Procedure: EXCISION  SOFT TISSUE MASS LEFT FLANK;  Surgeon: Darnell Level, MD;  Location: [facility];  Service: General;  Laterality: Left;   POLYPECTOMY     TONSILLECTOMY     VASECTOMY      REVIEW OF SYSTEMS:  Constitutional: positive for fatigue Eyes: negative Ears, nose, mouth,  throat, and face: positive for epistaxis Respiratory: negative Cardiovascular: negative Gastrointestinal: negative Genitourinary:negative Integument/breast: negative Hematologic/lymphatic: negative Musculoskeletal:positive for arthralgias and muscle weakness Neurological: positive for weakness Behavioral/Psych: negative Endocrine: negative Allergic/Immunologic: negative   PHYSICAL EXAMINATION: General appearance: alert, cooperative, fatigued, and no distress Head: Normocephalic, without obvious abnormality, atraumatic Neck: no adenopathy, no JVD, supple, symmetrical, trachea midline, and thyroid not enlarged, symmetric, no tenderness/mass/nodules Lymph nodes: Cervical, supraclavicular, and axillary nodes normal. Resp: clear to auscultation bilaterally Back: symmetric, no curvature. ROM normal. No CVA tenderness. Cardio: regular rate and rhythm, S1, S2 normal, no murmur, click, rub or gallop GI: soft, non-tender; bowel sounds normal; no masses,  no organomegaly Extremities:  extremities normal, atraumatic, no cyanosis or edema Neurologic: Alert and oriented X 3, normal strength and tone. Normal symmetric reflexes. Normal coordination and gait  ECOG PERFORMANCE STATUS: 1 - Symptomatic but completely ambulatory  Blood pressure 129/73, pulse 60, temperature (!) 97.5 F (36.4 C), temperature source Oral, resp. rate 16, height 6\' 1"  (1.854 m), weight 162 lb 12.8 oz (73.8 kg), SpO2 98%.  LABORATORY DATA: Lab Results  Component Value Date   WBC 8.0 09/28/2022   HGB 16.3 09/28/2022   HCT 51.0 09/28/2022   MCV 86.7 09/28/2022   PLT 336 09/28/2022      Chemistry      Component Value Date/Time   NA 140 05/12/2022 0812   NA 143 10/18/2019 0836   K 4.5 05/12/2022 0812   CL 105 05/12/2022 0812   CO2 27 05/12/2022 0812   BUN 12 05/12/2022 0812   BUN 12 10/18/2019 0836   CREATININE 1.22 05/12/2022 0812      Component Value Date/Time   CALCIUM 9.8 05/12/2022 0812   ALKPHOS 72  05/12/2022 0812   AST 22 05/12/2022 0812   ALT 19 05/12/2022 0812   BILITOT 0.8 05/12/2022 0812       RADIOGRAPHIC STUDIES: No results found.  ASSESSMENT AND PLAN: This is a very pleasant 87 years old white male with recently diagnosed polycythemia vera with positive Jak 2 mutation.  The patient is also currently on hormonal therapy for androgen deficiency. He underwent phlebotomy on as-needed basis in the past.  He also had essential thrombocythemia. The patient started treatment with hydroxyurea 500 mg p.o. daily as well as aspirin 81 mg p.o. daily.   Assessment and Plan    Polycythemia Vera Hematocrit elevated (51%) with ongoing hydroxyurea and aspirin therapy. No focal neurological symptoms suggestive of stroke, but recent changes in mental status and generalized weakness. -Continue hydroxyurea 500mg  daily and aspirin. -Perform phlebotomy today or at the emergency department to reduce hematocrit to 45 or less. -Refer to emergency department for evaluation of potential stroke, including possible brain imaging.  Epistaxis Recurrent monthly nosebleeds, with a recent episode lasting approximately two hours. -Managed with nasal spray. -Continue current management strategy.  Altered Mental Status Recent onset of confusion, emotional lability, and decreased appetite over the past week. Reports of auditory hallucinations. -Refer to emergency department for further evaluation, including potential psychiatric assessment.  Follow-up in approximately two months to reassess hematocrit and determine need for further phlebotomy.     The patient was advised to call immediately if he has any other concerning symptoms in the interval. The total time spent in the appointment was 30 minutes.  The patient voices understanding of current disease status and treatment options and is in agreement with the current care plan. All questions were answered. The patient knows to call the clinic with any  problems, questions or concerns. We can certainly see the patient much sooner if necessary.  Disclaimer: This note was dictated with voice recognition software. Similar sounding words can inadvertently be transcribed and may not be corrected upon review.

## 2022-09-28 NOTE — Telephone Encounter (Signed)
Patient sent to ED with [facility], LPN.  Vincent Peyer, RN called resport to charge nurse in the ED. Lorayne Marek, RN

## 2022-09-29 ENCOUNTER — Emergency Department (HOSPITAL_COMMUNITY)
Admission: EM | Admit: 2022-09-29 | Discharge: 2022-09-29 | Disposition: A | Discharge status: Home / Self Care | Payer: PPO | Attending: Emergency Medicine | Admitting: Emergency Medicine

## 2022-09-29 ENCOUNTER — Other Ambulatory Visit: Payer: Self-pay

## 2022-09-29 ENCOUNTER — Encounter (HOSPITAL_COMMUNITY): Payer: Self-pay

## 2022-09-29 DIAGNOSIS — F0390 Unspecified dementia without behavioral disturbance: Secondary | ICD-10-CM | POA: Insufficient documentation

## 2022-09-29 DIAGNOSIS — I959 Hypotension, unspecified: Secondary | ICD-10-CM | POA: Diagnosis not present

## 2022-09-29 DIAGNOSIS — I4891 Unspecified atrial fibrillation: Secondary | ICD-10-CM | POA: Diagnosis not present

## 2022-09-29 DIAGNOSIS — I110 Hypertensive heart disease with heart failure: Secondary | ICD-10-CM | POA: Diagnosis not present

## 2022-09-29 DIAGNOSIS — R001 Bradycardia, unspecified: Secondary | ICD-10-CM | POA: Diagnosis not present

## 2022-09-29 DIAGNOSIS — R531 Weakness: Secondary | ICD-10-CM | POA: Diagnosis not present

## 2022-09-29 DIAGNOSIS — Z7982 Long term (current) use of aspirin: Secondary | ICD-10-CM | POA: Diagnosis not present

## 2022-09-29 DIAGNOSIS — R410 Disorientation, unspecified: Secondary | ICD-10-CM | POA: Diagnosis not present

## 2022-09-29 DIAGNOSIS — I509 Heart failure, unspecified: Secondary | ICD-10-CM | POA: Insufficient documentation

## 2022-09-29 LAB — COMPREHENSIVE METABOLIC PANEL
ALT: 14 U/L (ref 0–44)
AST: 19 U/L (ref 15–41)
Albumin: 3.5 g/dL (ref 3.5–5.0)
Alkaline Phosphatase: 58 U/L (ref 38–126)
Anion gap: 9 (ref 5–15)
BUN: 15 mg/dL (ref 8–23)
CO2: 21 mmol/L — ABNORMAL LOW (ref 22–32)
Calcium: 8.8 mg/dL — ABNORMAL LOW (ref 8.9–10.3)
Chloride: 109 mmol/L (ref 98–111)
Creatinine, Ser: 1.28 mg/dL — ABNORMAL HIGH (ref 0.61–1.24)
GFR, Estimated: 55 mL/min — ABNORMAL LOW (ref >60)
Glucose, Bld: 82 mg/dL (ref 70–99)
Potassium: 3.9 mmol/L (ref 3.5–5.1)
Sodium: 139 mmol/L (ref 135–145)
Total Bilirubin: 0.9 mg/dL (ref 0.3–1.2)
Total Protein: 5.8 g/dL — ABNORMAL LOW (ref 6.5–8.1)

## 2022-09-29 LAB — CBC
HCT: 47.1 % (ref 39.0–52.0)
Hemoglobin: 15.1 g/dL (ref 13.0–17.0)
MCH: 27.8 pg (ref 26.0–34.0)
MCHC: 32.1 g/dL (ref 30.0–36.0)
MCV: 86.7 fL (ref 80.0–100.0)
Platelets: 331 10*3/uL (ref 150–400)
RBC: 5.43 MIL/uL (ref 4.22–5.81)
RDW: 15.2 % (ref 11.5–15.5)
WBC: 8.3 10*3/uL (ref 4.0–10.5)
nRBC: 0 % (ref 0.0–0.2)

## 2022-09-29 LAB — MAGNESIUM: Magnesium: 1.8 mg/dL (ref 1.7–2.4)

## 2022-09-29 LAB — CBG MONITORING, ED: Glucose-Capillary: 87 mg/dL (ref 70–99)

## 2022-09-29 MED ORDER — SODIUM CHLORIDE 0.9 % IV BOLUS
1000.0000 mL | Freq: Once | INTRAVENOUS | Status: AC
  Administered 2022-09-29: 1000 mL via INTRAVENOUS

## 2022-09-29 NOTE — ED Notes (Signed)
When this RN walked into triage room pt was in bed with leg tremors bilat, but then it stopped and pt was A&Ox1 to self only, which is possibly baseline. The second time pt was having whole body tremors, but mental status didn't change. Pt was still A&Ox1 to self only.

## 2022-09-29 NOTE — ED Triage Notes (Signed)
PT BIB [facility], from home, wife called after noting contractures and seizure like activity, contractures and scratching as though having allergic reaction. Wife and Pt both have altered mental state at baseline, Pt has GCS 12 PT received NS en route.

## 2022-09-29 NOTE — ED Provider Notes (Signed)
[place] EMERGENCY DEPARTMENT AT [facility] Provider Note   CSN: [id number] Arrival date & time: 09/29/22  1720     History  No chief complaint on file.   Dean Pratt is a 87 y.o. male.  HPI Patient with episodic cramps or movements of his arms and legs.  Seen yesterday for same.  History dementia which is worsening.  Discussed with patient's family member.  She thinks this is just worsening of his dementia.  He is at an assisted living and tomorrow she has a meeting while getting to a higher level of care.  However the patient's wife thinks we are not doing anything.  Became belligerent talking to nurse and left.   Past Medical History:  Diagnosis Date   Anxiety    Blood transfusion without reported diagnosis    BPH (benign prostatic hyperplasia)    Cancer (HCC)    basal cell CA removed   Cataract    removed with lens implants    CHF (congestive heart failure) (HCC)    Colitis, ischemic (HCC)    secondary to ebolism from afib 1/10   Constipation    GERD (gastroesophageal reflux disease)    Gout    HTN (hypertension)    Hyperlipidemia    Intracranial bleed (HCC) 04/10/2010   Right occipital hematoma with a left homonymous hemianopsia    Migraines    Paroxysmal atrial fibrillation (HCC)    s/p PVI 04/09/09 and 10/17/10   Rosacea    Stroke (HCC) 04/08/2010   hemorragic, anticoagulation stopped at that time   Tuberculosis    s/p treatment 1964    Home Medications Prior to Admission medications   Medication Sig Start Date End Date Taking? Authorizing Provider  acetaminophen (TYLENOL) 325 MG tablet Take 650 mg by mouth every 6 (six) hours as needed for mild pain.    [provider]  allopurinol (ZYLOPRIM) 100 MG tablet Take 100 mg by mouth at bedtime.      [provider]  Ascorbic Acid (VITAMIN C) 1000 MG tablet Take 1,000 mg by mouth daily.     [provider]  aspirin EC 81 MG tablet Take 1 tablet (81 mg total) by mouth  daily. Swallow whole. 03/13/21   Micki Riley, MD  cycloSPORINE (RESTASIS) 0.05 % ophthalmic emulsion Place 1 drop into both eyes 2 (two) times daily as needed (dry eyes, irritation). 03/15/18   [provider]  diclofenac Sodium (VOLTAREN) 1 % GEL Apply 2-4 g topically 4 (four) times daily. Patient taking differently: Apply 2-4 g topically 4 (four) times daily as needed (arthritis pain). 09/15/19   Kathryne Hitch, MD  diltiazem (CARDIZEM CD) 240 MG 24 hr capsule TAKE 1 CAPSULE DAILY. PLEASE CALL OFFICE TO SCHEDULE APPOINTMENT FOR FURTHER REFILLS Patient taking differently: Take 240 mg by mouth daily. 05/25/19   Georgeanna Lea, MD  docusate sodium (COLACE) 100 MG capsule Take 100 mg by mouth daily as needed for mild constipation. 01/20/11   [provider]  donepezil (ARICEPT) 10 MG tablet TAKE ONE TABLET BY MOUTH EVERYDAY AT BEDTIME 07/13/22   Ihor Austin, NP  famotidine (PEPCID) 20 MG tablet Take 20 mg by mouth at bedtime as needed for heartburn or indigestion. 05/16/20   [provider]  hydroxyurea (HYDREA) 500 MG capsule TAKE ONE CAPSULE BY MOUTH AT NOON 05/12/22   Si Gaul, MD  LORazepam (ATIVAN) 1 MG tablet Take 0.5-1 tablets (0.5-1 mg total) by mouth 2 (two) times daily  as needed for anxiety. 01/14/21   Osvaldo Shipper, MD  losartan (COZAAR) 25 MG tablet Take 1 tablet (25 mg total) by mouth daily. Please schedule appointment for further refills. 3rd and final attempt. 08/04/22   Georgeanna Lea, MD  memantine (NAMENDA) 10 MG tablet TAKE ONE TABLET BY MOUTH AT NOON and TAKE ONE TABLET BY MOUTH EVERYDAY AT BEDTIME 01/08/22   Micki Riley, MD  methocarbamol (ROBAXIN) 500 MG tablet Take 1 tablet (500 mg total) by mouth every 8 (eight) hours as needed for muscle spasms. 05/30/19   Tyrell Antonio, MD  Olopatadine HCl 0.2 % SOLN Place 1 drop into both eyes in the morning and at bedtime. 07/10/18   [provider]  OVER THE COUNTER MEDICATION  Place 1 spray into both nostrils daily as needed (congestion). Congestion nasal spray    [provider]  pantoprazole (PROTONIX) 40 MG tablet Take 1 tablet (40 mg total) by mouth daily. 09/02/20   Hilarie Fredrickson, MD  rosuvastatin (CRESTOR) 10 MG tablet Take 10 mg by mouth daily. 09/06/17   [provider]  vitamin B-12 (CYANOCOBALAMIN) 1000 MCG tablet Take 1,000 mcg by mouth daily.    [provider]      Allergies    Ace inhibitors and Warfarin and related    Review of Systems   Review of Systems  Physical Exam Updated Vital Signs BP (!) 118/55   Pulse 60   Temp 98.2 F (36.8 C) (Oral)   Resp 18   Ht 6\' 1"  (1.854 m)   Wt 73.5 kg   SpO2 100%   BMI 21.37 kg/m  Physical Exam Vitals and nursing note reviewed.  HENT:     Head: Normocephalic.  Neurological:     Mental Status: He is alert. Mental status is at baseline.     Comments: Awake and pleasant, but some confusion.   Moving extremities.  Patient showed me what happens in looks as if both his legs would briefly tighten up in extension.  ED Results / Procedures / Treatments   Labs (all labs ordered are listed, but only abnormal results are displayed) Labs Reviewed  COMPREHENSIVE METABOLIC PANEL - Abnormal; Notable for the following components:      Result Value   CO2 21 (*)    Creatinine, Ser 1.28 (*)    Calcium 8.8 (*)    Total Protein 5.8 (*)    GFR, Estimated 55 (*)    All other components within normal limits  CBC  MAGNESIUM  MAGNESIUM  CBG MONITORING, ED    EKG None  Radiology CT Head Wo Contrast  Result Date: 09/28/2022 CLINICAL DATA:  Altered mental status. EXAM: CT HEAD WITHOUT CONTRAST TECHNIQUE: Contiguous axial images were obtained from the base of the skull through the vertex without intravenous contrast. RADIATION DOSE REDUCTION: This exam was performed according to the departmental dose-optimization program which includes automated exposure control, adjustment of the mA  and/or kV according to patient size and/or use of iterative reconstruction technique. COMPARISON:  04/13/2022 FINDINGS: Brain: There is no evidence for acute hemorrhage, hydrocephalus, mass lesion, or abnormal extra-axial fluid collection. No definite CT evidence for acute infarction. Diffuse loss of parenchymal volume is consistent with atrophy. Patchy low attenuation in the deep hemispheric and periventricular white matter is nonspecific, but likely reflects chronic microvascular ischemic demyelination. 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 Vascular: No hyperdense vessel or unexpected calcification. Skull: No evidence for fracture. No worrisome lytic or sclerotic lesion. Sinuses/Orbits: The  visualized paranasal sinuses and mastoid air cells are clear. Ethmoid sinus disease seen previously has largely resolved in the interval. Visualized portions of the globes and intraorbital fat are unremarkable. Other: None. IMPRESSION: 1. No acute intracranial abnormality. 2. Atrophy with chronic small vessel white matter ischemic disease. 3. 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 Electronically Signed   By: Kennith Center M.D.   On: 09/28/2022 13:32    Procedures Procedures    Medications Ordered in ED Medications  sodium chloride 0.9 % bolus 1,000 mL (0 mLs Intravenous Stopped 09/29/22 2021)    ED Course/ Medical Decision Making/ A&P                                 Medical Decision Making Amount and/or Complexity of Data Reviewed Labs: ordered.  Patient with mental status change.  At times would have potential spasms of his arms.  Does not appear to be seizures.  Reviewed blood work.  Overall reassuring.  Creatinine slightly above baseline but near to it.  Long discussion with family members.  Discussed with family and they think it is reasonable to go home.  Unable to get a hold of the wife at this time since she stormed out.  Do not think he is at risk at home but likely does need higher  level of care.  Blood pressure initially low but is improved.  Fluid bolus that had been given.  Will discharge home.       Final Clinical Impression(s) / ED Diagnoses Final diagnoses:  Dementia, unspecified dementia severity, unspecified dementia type, unspecified whether behavioral, psychotic, or mood disturbance or anxiety [facility])    Rx / DC Orders ED Discharge Orders     None         Benjiman Core, MD 09/29/22 2117

## 2022-09-29 NOTE — ED Notes (Signed)
Family member came outside to get a update on PT and I informed her that all that they had ordered for her husband was done and we are now waiting for labs and physician orders.Family member is visibly upset and states that she wants to take him home because we aren't doing anything

## 2022-09-29 NOTE — ED Triage Notes (Signed)
PT BIB [facility], from home, wife called after noting contractures and seizure like activity, contractures and scratching as though having allergic reaction. Wife and Pt both have altered mental state at baseline, Pt has GCS 12

## 2022-09-30 NOTE — Telephone Encounter (Signed)
We received a fax back from [facility] stating: Patient refused to schedule appointment because his family decided to go another route.

## 2022-10-01 NOTE — Telephone Encounter (Signed)
Please see if patient/family refused scheduling due to long wait time. If so, can send referral to different location such as in [place] or near [place]. Thank you.

## 2022-10-02 DIAGNOSIS — R627 Adult failure to thrive: Secondary | ICD-10-CM | POA: Diagnosis not present

## 2022-10-02 DIAGNOSIS — I480 Paroxysmal atrial fibrillation: Secondary | ICD-10-CM | POA: Diagnosis not present

## 2022-10-02 DIAGNOSIS — F419 Anxiety disorder, unspecified: Secondary | ICD-10-CM | POA: Diagnosis not present

## 2022-10-02 DIAGNOSIS — I6930 Unspecified sequelae of cerebral infarction: Secondary | ICD-10-CM | POA: Diagnosis not present

## 2022-10-02 DIAGNOSIS — R410 Disorientation, unspecified: Secondary | ICD-10-CM | POA: Diagnosis not present

## 2022-10-02 DIAGNOSIS — F03A0 Unspecified dementia, mild, without behavioral disturbance, psychotic disturbance, mood disturbance, and anxiety: Secondary | ICD-10-CM | POA: Diagnosis not present

## 2022-10-02 DIAGNOSIS — G319 Degenerative disease of nervous system, unspecified: Secondary | ICD-10-CM | POA: Diagnosis not present

## 2022-10-02 DIAGNOSIS — R531 Weakness: Secondary | ICD-10-CM | POA: Diagnosis not present

## 2022-10-02 DIAGNOSIS — F1320 Sedative, hypnotic or anxiolytic dependence, uncomplicated: Secondary | ICD-10-CM | POA: Diagnosis not present

## 2022-10-02 DIAGNOSIS — H53462 Homonymous bilateral field defects, left side: Secondary | ICD-10-CM | POA: Diagnosis not present

## 2022-10-02 DIAGNOSIS — Z8679 Personal history of other diseases of the circulatory system: Secondary | ICD-10-CM | POA: Diagnosis not present

## 2022-10-05 NOTE — Telephone Encounter (Signed)
Can we see what locations are in patients network such as in [place] or [place]?  Hesitant to refer to Dr. Kieth Brightly as he appears to be scheduling into next summer and cognition has been rapidly declining per family. Family willing to travel if needed.   In regards to cramping/spasms in extremities, reviewed ED note and didn't seem to be clear cause. If Dr. Timothy Lasso wishes patient to be seen in this office for evaluation, will need to be scheduled with Dr. Pearlean Brownie for new symptom evaluation as I am not sure if this is related to his dementia. If he is scheduling way out, can see if POD 3 can work him in for sooner evaluation.

## 2022-10-05 NOTE — Telephone Encounter (Signed)
Pt's daughter, Efraim Kaufmann returned phone. Reason refused to be scheduled at [facility] due to being out of pt's insurance network. Pt was in ED Monday, 9/23 with spasms, seen  Dr Timothy Lasso on Friday 9/27. Would like Shanda Bumps to review notes to see if she wants him to be seen before March 2025.

## 2022-10-07 ENCOUNTER — Encounter (HOSPITAL_COMMUNITY): Payer: Self-pay

## 2022-10-07 ENCOUNTER — Emergency Department (HOSPITAL_COMMUNITY)
Admission: EM | Admit: 2022-10-07 | Discharge: 2022-10-08 | Disposition: A | Discharge status: Home / Self Care | Payer: PPO | Attending: Emergency Medicine | Admitting: Emergency Medicine

## 2022-10-07 ENCOUNTER — Other Ambulatory Visit: Payer: Self-pay

## 2022-10-07 ENCOUNTER — Emergency Department (HOSPITAL_COMMUNITY): Payer: PPO

## 2022-10-07 DIAGNOSIS — M79605 Pain in left leg: Secondary | ICD-10-CM | POA: Insufficient documentation

## 2022-10-07 DIAGNOSIS — Z79899 Other long term (current) drug therapy: Secondary | ICD-10-CM | POA: Insufficient documentation

## 2022-10-07 DIAGNOSIS — I110 Hypertensive heart disease with heart failure: Secondary | ICD-10-CM | POA: Diagnosis not present

## 2022-10-07 DIAGNOSIS — Z7982 Long term (current) use of aspirin: Secondary | ICD-10-CM | POA: Diagnosis not present

## 2022-10-07 DIAGNOSIS — I700 Atherosclerosis of aorta: Secondary | ICD-10-CM | POA: Diagnosis not present

## 2022-10-07 DIAGNOSIS — K769 Liver disease, unspecified: Secondary | ICD-10-CM | POA: Diagnosis not present

## 2022-10-07 DIAGNOSIS — I4891 Unspecified atrial fibrillation: Secondary | ICD-10-CM | POA: Diagnosis not present

## 2022-10-07 DIAGNOSIS — R1084 Generalized abdominal pain: Secondary | ICD-10-CM | POA: Insufficient documentation

## 2022-10-07 DIAGNOSIS — K429 Umbilical hernia without obstruction or gangrene: Secondary | ICD-10-CM | POA: Diagnosis not present

## 2022-10-07 DIAGNOSIS — R410 Disorientation, unspecified: Secondary | ICD-10-CM | POA: Diagnosis not present

## 2022-10-07 DIAGNOSIS — N400 Enlarged prostate without lower urinary tract symptoms: Secondary | ICD-10-CM | POA: Diagnosis not present

## 2022-10-07 DIAGNOSIS — M79604 Pain in right leg: Secondary | ICD-10-CM | POA: Diagnosis not present

## 2022-10-07 DIAGNOSIS — I10 Essential (primary) hypertension: Secondary | ICD-10-CM | POA: Diagnosis not present

## 2022-10-07 DIAGNOSIS — I509 Heart failure, unspecified: Secondary | ICD-10-CM | POA: Diagnosis not present

## 2022-10-07 DIAGNOSIS — R252 Cramp and spasm: Secondary | ICD-10-CM

## 2022-10-07 DIAGNOSIS — I959 Hypotension, unspecified: Secondary | ICD-10-CM | POA: Diagnosis not present

## 2022-10-07 DIAGNOSIS — N281 Cyst of kidney, acquired: Secondary | ICD-10-CM | POA: Diagnosis not present

## 2022-10-07 DIAGNOSIS — R0689 Other abnormalities of breathing: Secondary | ICD-10-CM | POA: Diagnosis not present

## 2022-10-07 DIAGNOSIS — R4182 Altered mental status, unspecified: Secondary | ICD-10-CM | POA: Diagnosis not present

## 2022-10-07 LAB — URINALYSIS, ROUTINE W REFLEX MICROSCOPIC
Bilirubin Urine: NEGATIVE
Glucose, UA: NEGATIVE mg/dL
Hgb urine dipstick: NEGATIVE
Ketones, ur: 5 mg/dL — AB
Leukocytes,Ua: NEGATIVE
Nitrite: NEGATIVE
Protein, ur: NEGATIVE mg/dL
Specific Gravity, Urine: 1.016 (ref 1.005–1.030)
pH: 5 (ref 5.0–8.0)

## 2022-10-07 LAB — COMPREHENSIVE METABOLIC PANEL
ALT: 14 U/L (ref 0–44)
AST: 19 U/L (ref 15–41)
Albumin: 4.3 g/dL (ref 3.5–5.0)
Alkaline Phosphatase: 66 U/L (ref 38–126)
Anion gap: 9 (ref 5–15)
BUN: 15 mg/dL (ref 8–23)
CO2: 23 mmol/L (ref 22–32)
Calcium: 9.6 mg/dL (ref 8.9–10.3)
Chloride: 109 mmol/L (ref 98–111)
Creatinine, Ser: 1.25 mg/dL — ABNORMAL HIGH (ref 0.61–1.24)
GFR, Estimated: 56 mL/min — ABNORMAL LOW (ref >60)
Glucose, Bld: 96 mg/dL (ref 70–99)
Potassium: 3.4 mmol/L — ABNORMAL LOW (ref 3.5–5.1)
Sodium: 141 mmol/L (ref 135–145)
Total Bilirubin: 0.9 mg/dL (ref 0.3–1.2)
Total Protein: 7 g/dL (ref 6.5–8.1)

## 2022-10-07 LAB — CBC
HCT: 53.6 % — ABNORMAL HIGH (ref 39.0–52.0)
Hemoglobin: 16.8 g/dL (ref 13.0–17.0)
MCH: 27.4 pg (ref 26.0–34.0)
MCHC: 31.3 g/dL (ref 30.0–36.0)
MCV: 87.4 fL (ref 80.0–100.0)
Platelets: 382 10*3/uL (ref 150–400)
RBC: 6.13 MIL/uL — ABNORMAL HIGH (ref 4.22–5.81)
RDW: 16.9 % — ABNORMAL HIGH (ref 11.5–15.5)
WBC: 8.3 10*3/uL (ref 4.0–10.5)
nRBC: 0 % (ref 0.0–0.2)

## 2022-10-07 LAB — TROPONIN I (HIGH SENSITIVITY): Troponin I (High Sensitivity): 11 ng/L (ref <18)

## 2022-10-07 LAB — LIPASE, BLOOD: Lipase: 45 U/L (ref 11–51)

## 2022-10-07 MED ORDER — IOHEXOL 300 MG/ML  SOLN
100.0000 mL | Freq: Once | INTRAMUSCULAR | Status: AC | PRN
  Administered 2022-10-07: 100 mL via INTRAVENOUS

## 2022-10-07 MED ORDER — LACTATED RINGERS IV BOLUS
500.0000 mL | Freq: Once | INTRAVENOUS | Status: AC
  Administered 2022-10-07: 500 mL via INTRAVENOUS

## 2022-10-07 NOTE — Telephone Encounter (Signed)
Noted  

## 2022-10-07 NOTE — ED Provider Notes (Signed)
[place] EMERGENCY DEPARTMENT AT [facility] Provider Note   CSN: [id number] Arrival date & time: 10/07/22  2011     History  Chief Complaint  Patient presents with   Abdominal Pain    Dean Pratt is a 87 y.o. male.   Abdominal Pain 87 year old male history of dementia, CHF, GERD, hypertension, hyperlipidemia, atrial fibrillation presenting for abdominal pain.  History from patient is limited, he reports abdominal pain but is unsure how long.  Denies chest pain, shortness of breath, nausea, vomiting.  Discussed with wife Eber Jones.  She was concerned that he is constipated.  He said abdominal pain for couple days and less abdominal at this morning but it was a very small volume.  No vomiting or diarrhea.  Poor p.o. intake.  No fevers or chills or change in urination as far she knows.  She feels that he is more confused than his baseline.  This has been ongoing with the abdominal pain.  No falls as far she is aware     Home Medications Prior to Admission medications   Medication Sig Start Date End Date Taking? Authorizing Provider  acetaminophen (TYLENOL) 325 MG tablet Take 650 mg by mouth every 6 (six) hours as needed for mild pain.    [provider]  allopurinol (ZYLOPRIM) 100 MG tablet Take 100 mg by mouth at bedtime.      [provider]  Ascorbic Acid (VITAMIN C) 1000 MG tablet Take 1,000 mg by mouth daily.     [provider]  aspirin EC 81 MG tablet Take 1 tablet (81 mg total) by mouth daily. Swallow whole. 03/13/21   Micki Riley, MD  cycloSPORINE (RESTASIS) 0.05 % ophthalmic emulsion Place 1 drop into both eyes 2 (two) times daily as needed (dry eyes, irritation). 03/15/18   [provider]  diclofenac Sodium (VOLTAREN) 1 % GEL Apply 2-4 g topically 4 (four) times daily. Patient taking differently: Apply 2-4 g topically 4 (four) times daily as needed (arthritis pain). 09/15/19   Kathryne Hitch, MD  diltiazem  (CARDIZEM CD) 240 MG 24 hr capsule TAKE 1 CAPSULE DAILY. PLEASE CALL OFFICE TO SCHEDULE APPOINTMENT FOR FURTHER REFILLS Patient taking differently: Take 240 mg by mouth daily. 05/25/19   Georgeanna Lea, MD  docusate sodium (COLACE) 100 MG capsule Take 100 mg by mouth daily as needed for mild constipation. 01/20/11   [provider]  donepezil (ARICEPT) 10 MG tablet TAKE ONE TABLET BY MOUTH EVERYDAY AT BEDTIME 07/13/22   Ihor Austin, NP  escitalopram (LEXAPRO) 10 MG tablet Take 10 mg by mouth daily.    [provider]  famotidine (PEPCID) 20 MG tablet Take 20 mg by mouth at bedtime as needed for heartburn or indigestion. 05/16/20   [provider]  hydroxyurea (HYDREA) 500 MG capsule TAKE ONE CAPSULE BY MOUTH AT NOON 05/12/22   Si Gaul, MD  LORazepam (ATIVAN) 1 MG tablet Take 0.5-1 tablets (0.5-1 mg total) by mouth 2 (two) times daily as needed for anxiety. 01/14/21   Osvaldo Shipper, MD  losartan (COZAAR) 25 MG tablet Take 1 tablet (25 mg total) by mouth daily. Please schedule appointment for further refills. 3rd and final attempt. 08/04/22   Georgeanna Lea, MD  memantine (NAMENDA) 10 MG tablet TAKE ONE TABLET BY MOUTH AT NOON and TAKE ONE TABLET BY MOUTH EVERYDAY AT BEDTIME 01/08/22   Micki Riley, MD  methocarbamol (ROBAXIN) 500 MG tablet Take 1 tablet (500 mg total) by  mouth every 8 (eight) hours as needed for muscle spasms. 05/30/19   Tyrell Antonio, MD  Olopatadine HCl 0.2 % SOLN Place 1 drop into both eyes in the morning and at bedtime. 07/10/18   [provider]  OVER THE COUNTER MEDICATION Place 1 spray into both nostrils daily as needed (congestion). Congestion nasal spray    [provider]  pantoprazole (PROTONIX) 40 MG tablet Take 1 tablet (40 mg total) by mouth daily. 09/02/20   Hilarie Fredrickson, MD  rosuvastatin (CRESTOR) 10 MG tablet Take 10 mg by mouth daily. 09/06/17   [provider]  vitamin B-12 (CYANOCOBALAMIN) 1000 MCG  tablet Take 1,000 mcg by mouth daily.    [provider]      Allergies    Warfarin and related and Ace inhibitors    Review of Systems   Review of Systems  Gastrointestinal:  Positive for abdominal pain.  Review of systems completed and notable as per HPI.  ROS otherwise negative.   Physical Exam Updated Vital Signs BP (!) 145/95   Pulse 61   Temp 97.9 F (36.6 C)   Resp 16   SpO2 97%  Physical Exam Vitals and nursing note reviewed.  Constitutional:      General: He is not in acute distress.    Appearance: He is well-developed.  HENT:     Head: Normocephalic and atraumatic.     Mouth/Throat:     Mouth: Mucous membranes are moist.     Pharynx: Oropharynx is clear.  Eyes:     Extraocular Movements: Extraocular movements intact.     Conjunctiva/sclera: Conjunctivae normal.     Pupils: Pupils are equal, round, and reactive to light.  Cardiovascular:     Rate and Rhythm: Normal rate and regular rhythm.     Heart sounds: No murmur heard. Pulmonary:     Effort: Pulmonary effort is normal. No respiratory distress.     Breath sounds: Normal breath sounds.  Abdominal:     Palpations: Abdomen is soft.     Tenderness: There is abdominal tenderness. There is no guarding or rebound.     Comments: Mild generalized tenderness  Musculoskeletal:        General: No swelling.     Cervical back: Normal range of motion and neck supple. No rigidity or tenderness.     Right lower leg: No edema.     Left lower leg: No edema.  Skin:    General: Skin is warm and dry.     Capillary Refill: Capillary refill takes less than 2 seconds.  Neurological:     General: No focal deficit present.     Mental Status: He is alert. He is disoriented.     Cranial Nerves: No cranial nerve deficit.     Sensory: No sensory deficit.     Motor: No weakness.     Comments: Oriented to self.  Knows he is in the hospital but not which hospital.  Does not know the year.  He is able to answer basic  questions but very poor memory short-term.  Psychiatric:        Mood and Affect: Mood normal.     ED Results / Procedures / Treatments   Labs (all labs ordered are listed, but only abnormal results are displayed) Labs Reviewed  COMPREHENSIVE METABOLIC PANEL - Abnormal; Notable for the following components:      Result Value   Potassium 3.4 (*)    Creatinine, Ser 1.25 (*)  GFR, Estimated 56 (*)    All other components within normal limits  CBC - Abnormal; Notable for the following components:   RBC 6.13 (*)    HCT 53.6 (*)    RDW 16.9 (*)    All other components within normal limits  URINALYSIS, ROUTINE W REFLEX MICROSCOPIC - Abnormal; Notable for the following components:   APPearance HAZY (*)    Ketones, ur 5 (*)    All other components within normal limits  LIPASE, BLOOD  TROPONIN I (HIGH SENSITIVITY)  TROPONIN I (HIGH SENSITIVITY)    EKG EKG Interpretation Date/Time:  Wednesday October 07 2022 22:18:40 EDT Ventricular Rate:  56 PR Interval:    QRS Duration:  115 QT Interval:  455 QTC Calculation: 440 R Axis:   57  Text Interpretation: Atrial fibrillation Nonspecific intraventricular conduction delay Probable anterior infarct, age indeterminate Confirmed by Fulton Reek [phone]) on 10/07/2022 10:42:45 PM  Radiology DG Chest Port 1 View  Result Date: 10/07/2022 CLINICAL DATA:  Altered mental status EXAM: PORTABLE CHEST 1 VIEW COMPARISON:  04/13/2022 FINDINGS: Cardiac shadow is within normal limits. Aortic calcifications are noted. Lungs are well aerated bilaterally. No bony abnormality is noted. IMPRESSION: No active disease. Electronically Signed   By: Alcide Clever M.D.   On: 10/07/2022 22:13    Procedures Procedures    Medications Ordered in ED Medications  lactated ringers bolus 500 mL (500 mLs Intravenous New Bag/Given 10/07/22 2223)  iohexol (OMNIPAQUE) 300 MG/ML solution 100 mL (100 mLs Intravenous Contrast Given 10/07/22 2255)    ED Course/ Medical  Decision Making/ A&P                                 Medical Decision Making Amount and/or Complexity of Data Reviewed Labs: ordered. Radiology: ordered.  Risk Prescription drug management.   Medical Decision Making:   NYLEN CREQUE is a 87 y.o. male who presented to the ED today with abdominal pain.  Vital signs reviewed.  Exam he is disoriented, does have history of dementia.  Collateral from wife is concerning for complains of abdominal pain, poor p.o. intake.  She reports he is more confused than his baseline.  On exam he is mildly tender diffusely.  History is limited.  His EKG shows atrial fibrillation, no signs of acute ischemia.  Troponin is normal, low concern for ACS.  CT abdomen pelvis pending for evaluation of possible obstruction, cholecystitis, appendicitis, AAA.  He has been somewhat confused, no signs of urinary retention here or UTI.  Labs notable for K of 3.4.  CBC is unremarkable.  CT head is pending as well for evaluation of his mental status.  He is no focal deficits, low concern for stroke.   Additional history discussed with patient's family/caregivers.  Patient placed on continuous vitals and telemetry monitoring while in ED which was reviewed periodically.  Reviewed and confirmed nursing documentation for past medical history, family history, social history.  Reassessment and Plan:   Patient remained stable on reassessment.  Blood work here is overall unremarkable, no signs of UTI.  Case of a 3.4.  Troponin is normal.  CT head and abdomen pelvis are pending.  Handoff given to Dr. Adela Lank at midnight with plan to follow-up CT scan and reassess.   Patient's presentation is most consistent with acute presentation with potential threat to life or bodily function.           Final Clinical Impression(s) /  ED Diagnoses Final diagnoses:  Generalized abdominal pain    Rx / DC Orders ED Discharge Orders     None         Laurence Spates, MD 10/08/22  0001

## 2022-10-07 NOTE — Telephone Encounter (Addendum)
Contacted pt's daughter, Efraim Kaufmann suggest to check pt's insurance for neuropsychology in pt's insurance network. Shesaid, she would check to see which facility would be in pt's insurance network and give call back with that information. She said she will be going out to town on Saturday, 10/09/22 would working on it, but maybe mid November can get back to [place] with that information.

## 2022-10-07 NOTE — ED Triage Notes (Signed)
Pt arrived from home via [facility] pt is a poor historian oriented to person only. Pt has noted hx of dementia , per family he is not at his baseline. No known actual time and date of change.

## 2022-10-08 NOTE — Discharge Instructions (Signed)
Follow up with your family doctor in the office.  Return for worsening symptoms.

## 2022-10-08 NOTE — ED Provider Notes (Signed)
Received patient in turnover from Dr. Earlene Plater.  Please see their note for further details of Hx, PE.  Briefly patient is a 87 y.o. male with a Abdominal Pain .  Patient also with acute confusion.  Plan for CT imaging of the head as well as the abdomen pelvis.  CT of the head shows a old stroke.  CT abdomen pelvis without obvious acute intra-abdominal pathology.  I reassessed the patient and she would like to go home at this time.  Family showed up later and had a long discussion with them.  It sounds like he was seen in September and there was some thought that he should be moved to the assisted living section of the facility however his wife had refused to have him moved.  She now wishes that maybe he has been moved.  They had seen their family doctor since then.  Wife feels like maybe he should be placed into the higher level of care section of their facility.  I encouraged her to discuss that with PCP.  Encouraged her to discuss that with the facility.    Melene Plan, DO 10/08/22 [phone]

## 2022-10-09 ENCOUNTER — Encounter (HOSPITAL_COMMUNITY): Payer: Self-pay | Admitting: Emergency Medicine

## 2022-10-09 ENCOUNTER — Observation Stay (HOSPITAL_COMMUNITY): Payer: PPO

## 2022-10-09 ENCOUNTER — Observation Stay (HOSPITAL_COMMUNITY)
Admission: EM | Admit: 2022-10-09 | Discharge: 2022-10-13 | Disposition: A | Discharge status: Home / Self Care | Payer: PPO | Attending: Internal Medicine | Admitting: Internal Medicine

## 2022-10-09 ENCOUNTER — Other Ambulatory Visit: Payer: Self-pay

## 2022-10-09 ENCOUNTER — Emergency Department (HOSPITAL_COMMUNITY): Payer: PPO

## 2022-10-09 DIAGNOSIS — E785 Hyperlipidemia, unspecified: Secondary | ICD-10-CM | POA: Diagnosis present

## 2022-10-09 DIAGNOSIS — I672 Cerebral atherosclerosis: Secondary | ICD-10-CM | POA: Diagnosis not present

## 2022-10-09 DIAGNOSIS — R569 Unspecified convulsions: Secondary | ICD-10-CM | POA: Diagnosis not present

## 2022-10-09 DIAGNOSIS — R400 Somnolence: Principal | ICD-10-CM

## 2022-10-09 DIAGNOSIS — F0150 Vascular dementia without behavioral disturbance: Secondary | ICD-10-CM | POA: Diagnosis present

## 2022-10-09 DIAGNOSIS — R2681 Unsteadiness on feet: Secondary | ICD-10-CM | POA: Insufficient documentation

## 2022-10-09 DIAGNOSIS — R29818 Other symptoms and signs involving the nervous system: Secondary | ICD-10-CM | POA: Diagnosis not present

## 2022-10-09 DIAGNOSIS — G9340 Encephalopathy, unspecified: Secondary | ICD-10-CM | POA: Diagnosis not present

## 2022-10-09 DIAGNOSIS — F0390 Unspecified dementia without behavioral disturbance: Secondary | ICD-10-CM | POA: Insufficient documentation

## 2022-10-09 DIAGNOSIS — Z7982 Long term (current) use of aspirin: Secondary | ICD-10-CM | POA: Diagnosis not present

## 2022-10-09 DIAGNOSIS — I6523 Occlusion and stenosis of bilateral carotid arteries: Secondary | ICD-10-CM | POA: Diagnosis not present

## 2022-10-09 DIAGNOSIS — I708 Atherosclerosis of other arteries: Secondary | ICD-10-CM | POA: Diagnosis not present

## 2022-10-09 DIAGNOSIS — G9341 Metabolic encephalopathy: Secondary | ICD-10-CM | POA: Diagnosis not present

## 2022-10-09 DIAGNOSIS — D45 Polycythemia vera: Secondary | ICD-10-CM | POA: Diagnosis present

## 2022-10-09 DIAGNOSIS — R001 Bradycardia, unspecified: Secondary | ICD-10-CM | POA: Diagnosis not present

## 2022-10-09 DIAGNOSIS — G928 Other toxic encephalopathy: Secondary | ICD-10-CM | POA: Insufficient documentation

## 2022-10-09 DIAGNOSIS — R4182 Altered mental status, unspecified: Secondary | ICD-10-CM | POA: Diagnosis present

## 2022-10-09 DIAGNOSIS — R4020 Unspecified coma: Secondary | ICD-10-CM

## 2022-10-09 DIAGNOSIS — N179 Acute kidney failure, unspecified: Secondary | ICD-10-CM | POA: Diagnosis present

## 2022-10-09 DIAGNOSIS — I482 Chronic atrial fibrillation, unspecified: Secondary | ICD-10-CM | POA: Diagnosis not present

## 2022-10-09 DIAGNOSIS — R2689 Other abnormalities of gait and mobility: Secondary | ICD-10-CM | POA: Diagnosis not present

## 2022-10-09 DIAGNOSIS — Z8673 Personal history of transient ischemic attack (TIA), and cerebral infarction without residual deficits: Secondary | ICD-10-CM | POA: Diagnosis not present

## 2022-10-09 DIAGNOSIS — R41841 Cognitive communication deficit: Secondary | ICD-10-CM | POA: Insufficient documentation

## 2022-10-09 DIAGNOSIS — Z85828 Personal history of other malignant neoplasm of skin: Secondary | ICD-10-CM | POA: Insufficient documentation

## 2022-10-09 DIAGNOSIS — M6281 Muscle weakness (generalized): Secondary | ICD-10-CM | POA: Insufficient documentation

## 2022-10-09 DIAGNOSIS — K253 Acute gastric ulcer without hemorrhage or perforation: Secondary | ICD-10-CM

## 2022-10-09 DIAGNOSIS — I10 Essential (primary) hypertension: Secondary | ICD-10-CM | POA: Diagnosis present

## 2022-10-09 DIAGNOSIS — Z7901 Long term (current) use of anticoagulants: Secondary | ICD-10-CM | POA: Diagnosis not present

## 2022-10-09 DIAGNOSIS — I509 Heart failure, unspecified: Secondary | ICD-10-CM | POA: Insufficient documentation

## 2022-10-09 DIAGNOSIS — I110 Hypertensive heart disease with heart failure: Secondary | ICD-10-CM | POA: Insufficient documentation

## 2022-10-09 DIAGNOSIS — R9089 Other abnormal findings on diagnostic imaging of central nervous system: Secondary | ICD-10-CM | POA: Diagnosis not present

## 2022-10-09 DIAGNOSIS — R270 Ataxia, unspecified: Secondary | ICD-10-CM | POA: Diagnosis not present

## 2022-10-09 HISTORY — DX: Alzheimer's disease, unspecified: G30.9

## 2022-10-09 HISTORY — DX: Vascular dementia, unspecified severity, without behavioral disturbance, psychotic disturbance, mood disturbance, and anxiety: F01.50

## 2022-10-09 HISTORY — DX: Dementia in other diseases classified elsewhere, unspecified severity, without behavioral disturbance, psychotic disturbance, mood disturbance, and anxiety: F02.80

## 2022-10-09 LAB — DIFFERENTIAL
Abs Immature Granulocytes: 0.04 10*3/uL (ref 0.00–0.07)
Basophils Absolute: 0.1 10*3/uL (ref 0.0–0.1)
Basophils Relative: 1 %
Eosinophils Absolute: 0.1 10*3/uL (ref 0.0–0.5)
Eosinophils Relative: 2 %
Immature Granulocytes: 1 %
Lymphocytes Relative: 12 %
Lymphs Abs: 0.9 10*3/uL (ref 0.7–4.0)
Monocytes Absolute: 0.6 10*3/uL (ref 0.1–1.0)
Monocytes Relative: 8 %
Neutro Abs: 5.8 10*3/uL (ref 1.7–7.7)
Neutrophils Relative %: 76 %

## 2022-10-09 LAB — COMPREHENSIVE METABOLIC PANEL
ALT: 14 U/L (ref 0–44)
AST: 21 U/L (ref 15–41)
Albumin: 3.8 g/dL (ref 3.5–5.0)
Alkaline Phosphatase: 61 U/L (ref 38–126)
Anion gap: 13 (ref 5–15)
BUN: 14 mg/dL (ref 8–23)
CO2: 25 mmol/L (ref 22–32)
Calcium: 9.6 mg/dL (ref 8.9–10.3)
Chloride: 107 mmol/L (ref 98–111)
Creatinine, Ser: 1.44 mg/dL — ABNORMAL HIGH (ref 0.61–1.24)
GFR, Estimated: 48 mL/min — ABNORMAL LOW (ref >60)
Glucose, Bld: 112 mg/dL — ABNORMAL HIGH (ref 70–99)
Potassium: 3.5 mmol/L (ref 3.5–5.1)
Sodium: 145 mmol/L (ref 135–145)
Total Bilirubin: 0.5 mg/dL (ref 0.3–1.2)
Total Protein: 6.4 g/dL — ABNORMAL LOW (ref 6.5–8.1)

## 2022-10-09 LAB — RAPID URINE DRUG SCREEN, HOSP PERFORMED
Amphetamines: NOT DETECTED
Barbiturates: NOT DETECTED
Benzodiazepines: NOT DETECTED
Cocaine: NOT DETECTED
Opiates: NOT DETECTED
Tetrahydrocannabinol: NOT DETECTED

## 2022-10-09 LAB — PROTIME-INR
INR: 1.2 (ref 0.8–1.2)
Prothrombin Time: 15.6 s — ABNORMAL HIGH (ref 11.4–15.2)

## 2022-10-09 LAB — I-STAT CHEM 8, ED
BUN: 15 mg/dL (ref 8–23)
Calcium, Ion: 1.26 mmol/L (ref 1.15–1.40)
Chloride: 108 mmol/L (ref 98–111)
Creatinine, Ser: 1.6 mg/dL — ABNORMAL HIGH (ref 0.61–1.24)
Glucose, Bld: 108 mg/dL — ABNORMAL HIGH (ref 70–99)
HCT: 48 % (ref 39.0–52.0)
Hemoglobin: 16.3 g/dL (ref 13.0–17.0)
Potassium: 3.4 mmol/L — ABNORMAL LOW (ref 3.5–5.1)
Sodium: 146 mmol/L — ABNORMAL HIGH (ref 135–145)
TCO2: 26 mmol/L (ref 22–32)

## 2022-10-09 LAB — URINALYSIS, ROUTINE W REFLEX MICROSCOPIC
Bilirubin Urine: NEGATIVE
Glucose, UA: NEGATIVE mg/dL
Hgb urine dipstick: NEGATIVE
Ketones, ur: 5 mg/dL — AB
Leukocytes,Ua: NEGATIVE
Nitrite: NEGATIVE
Protein, ur: NEGATIVE mg/dL
Specific Gravity, Urine: >1.046 — ABNORMAL HIGH (ref 1.005–1.030)
pH: 5 (ref 5.0–8.0)

## 2022-10-09 LAB — CBC
HCT: 50.2 % (ref 39.0–52.0)
Hemoglobin: 15.9 g/dL (ref 13.0–17.0)
MCH: 27.4 pg (ref 26.0–34.0)
MCHC: 31.7 g/dL (ref 30.0–36.0)
MCV: 86.6 fL (ref 80.0–100.0)
Platelets: 341 10*3/uL (ref 150–400)
RBC: 5.8 MIL/uL (ref 4.22–5.81)
RDW: 16.5 % — ABNORMAL HIGH (ref 11.5–15.5)
WBC: 7.6 10*3/uL (ref 4.0–10.5)
nRBC: 0 % (ref 0.0–0.2)

## 2022-10-09 LAB — APTT: aPTT: 32 s (ref 24–36)

## 2022-10-09 LAB — TROPONIN I (HIGH SENSITIVITY)
Troponin I (High Sensitivity): 14 ng/L (ref <18)
Troponin I (High Sensitivity): 17 ng/L (ref <18)

## 2022-10-09 LAB — ETHANOL: Alcohol, Ethyl (B): <10 mg/dL (ref <10)

## 2022-10-09 LAB — CBG MONITORING, ED: Glucose-Capillary: 112 mg/dL — ABNORMAL HIGH (ref 70–99)

## 2022-10-09 MED ORDER — NALOXONE HCL 0.4 MG/ML IJ SOLN
0.4000 mg | Freq: Once | INTRAMUSCULAR | Status: AC
  Administered 2022-10-09: 0.4 mg via INTRAVENOUS
  Filled 2022-10-09: qty 1

## 2022-10-09 MED ORDER — ONDANSETRON HCL 4 MG/2ML IJ SOLN: 4.0000 mg | Freq: Four times a day (QID) | INTRAMUSCULAR | Status: DC | PRN

## 2022-10-09 MED ORDER — ENOXAPARIN SODIUM 40 MG/0.4ML IJ SOSY
40.0000 mg | PREFILLED_SYRINGE | INTRAMUSCULAR | Status: DC
  Administered 2022-10-10 – 2022-10-12 (×3): 40 mg via SUBCUTANEOUS
  Filled 2022-10-09 (×3): qty 0.4

## 2022-10-09 MED ORDER — SODIUM CHLORIDE 0.9 % IV SOLN: INTRAVENOUS | Status: AC

## 2022-10-09 MED ORDER — LORAZEPAM 2 MG/ML IJ SOLN: 0.5000 mg | Freq: Once | INTRAMUSCULAR | Status: DC | PRN

## 2022-10-09 MED ORDER — MEMANTINE HCL 10 MG PO TABS
10.0000 mg | ORAL_TABLET | Freq: Two times a day (BID) | ORAL | Status: DC
  Administered 2022-10-10 – 2022-10-13 (×7): 10 mg via ORAL
  Filled 2022-10-09 (×7): qty 1

## 2022-10-09 MED ORDER — LORAZEPAM 2 MG/ML IJ SOLN
0.5000 mg | Freq: Once | INTRAMUSCULAR | Status: AC
  Administered 2022-10-09: 0.5 mg via INTRAVENOUS
  Filled 2022-10-09: qty 1

## 2022-10-09 MED ORDER — ACETAMINOPHEN 160 MG/5ML PO SOLN: 650.0000 mg | ORAL | Status: DC | PRN

## 2022-10-09 MED ORDER — IOHEXOL 350 MG/ML SOLN
75.0000 mL | Freq: Once | INTRAVENOUS | Status: AC | PRN
  Administered 2022-10-09: 75 mL via INTRAVENOUS

## 2022-10-09 MED ORDER — STROKE: EARLY STAGES OF RECOVERY BOOK
Freq: Once | Status: DC
  Filled 2022-10-09: qty 1

## 2022-10-09 MED ORDER — ASPIRIN 81 MG PO TBEC
81.0000 mg | DELAYED_RELEASE_TABLET | Freq: Every day | ORAL | Status: DC
  Administered 2022-10-10 – 2022-10-13 (×4): 81 mg via ORAL
  Filled 2022-10-09 (×4): qty 1

## 2022-10-09 MED ORDER — DONEPEZIL HCL 10 MG PO TABS
10.0000 mg | ORAL_TABLET | Freq: Every day | ORAL | Status: DC
  Administered 2022-10-10 – 2022-10-12 (×3): 10 mg via ORAL
  Filled 2022-10-09 (×3): qty 1

## 2022-10-09 MED ORDER — ACETAMINOPHEN 650 MG RE SUPP: 650.0000 mg | RECTAL | Status: DC | PRN

## 2022-10-09 MED ORDER — SENNOSIDES-DOCUSATE SODIUM 8.6-50 MG PO TABS: 1.0000 | ORAL_TABLET | Freq: Every evening | ORAL | Status: DC | PRN

## 2022-10-09 MED ORDER — ROSUVASTATIN CALCIUM 5 MG PO TABS
10.0000 mg | ORAL_TABLET | Freq: Every day | ORAL | Status: DC
  Administered 2022-10-10 – 2022-10-13 (×4): 10 mg via ORAL
  Filled 2022-10-09 (×4): qty 2

## 2022-10-09 MED ORDER — HYDROXYUREA 500 MG PO CAPS
500.0000 mg | ORAL_CAPSULE | Freq: Every day | ORAL | Status: DC
  Administered 2022-10-10 – 2022-10-12 (×3): 500 mg via ORAL
  Filled 2022-10-09 (×4): qty 1

## 2022-10-09 MED ORDER — ACETAMINOPHEN 325 MG PO TABS
650.0000 mg | ORAL_TABLET | ORAL | Status: DC | PRN
  Administered 2022-10-12 (×2): 650 mg via ORAL
  Filled 2022-10-09 (×2): qty 2

## 2022-10-09 NOTE — ED Notes (Signed)
EDP called to bedside to assess patient. Not wanting to activate code stroke at this time. Going to look at medical history and speak with neuro. EDP speaking with wife on phone.

## 2022-10-09 NOTE — Progress Notes (Signed)
EEG complete - results pending 

## 2022-10-09 NOTE — Progress Notes (Signed)
Pt is in transit and is NOT in the ED, Pt is heading to [phone] but has NOT arrived.

## 2022-10-09 NOTE — Consult Note (Signed)
NEURO HOSPITALIST CONSULT NOTE   Requestig physician: Dr. Eloise Harman  Reason for Consult: Code Stroke  History obtained from: Family and Chart     HPI:                                                                                                                                          Dean Pratt is an 87 y.o. male with PMH significant for previous subarachnoid hemorrhage in Jan 2023, right parieto-occipital ischemic stroke in 2012, atrial fibrillation not on anticoagulation, polycythemia vera, thoracic ascending aortic aneurysm, hypertension, hyperlipidemia, and dementia who presents to the ED with aphasia. Patient was found by wife in the bathroom where he was slumped over and unresponsive. EMS was called and patient was able to be aroused with sternal rub, but continued to be aphasic. Additionally, patient noted to be in Afib at the time. Once in the ED, several attempts were made to awaken patient, with no movement on the right side to noxious stimuli. He then became agitated with arms and legs moving in thrashing type manner. Patient was given Narcan at 12:51 with no improvement and code stroke was called.  On exam at bedside in CT, patient following some commands and moving all 4 extremities, but still aphasic. After returning to room, patient began interacting with staff more, asking staff to "quit it" during exam. CTA revealed no hemorrhage or other acute abnormalities.   Patient's last known well recorded at 10:30am by wife, but is not a candidate for TNK due to previous subarachnoid hemorrhage. Patient noted to have multiple events in the last few days of abnormal movements and altered mental status, will get MRI and EEG.   LNW: 10:30am TNK: No, not a candidate, low suspicion for stroke Thrombectomy: No, low suspicion for LVO  Past Medical History:  Diagnosis Date   Anxiety    Blood transfusion without reported diagnosis    BPH (benign prostatic hyperplasia)     Cancer (HCC)    basal cell CA removed   Cataract    removed with lens implants    CHF (congestive heart failure) (HCC)    Colitis, ischemic (HCC)    secondary to ebolism from afib 1/10   Constipation    GERD (gastroesophageal reflux disease)    Gout    HTN (hypertension)    Hyperlipidemia    Intracranial bleed (HCC) 04/10/2010   Right occipital hematoma with a left homonymous hemianopsia    Migraines    Paroxysmal atrial fibrillation (HCC)    s/p PVI 04/09/09 and 10/17/10   Rosacea    Stroke (HCC) 04/08/2010   hemorragic, anticoagulation stopped at that time   Tuberculosis    s/p treatment 1964    Past Surgical History:  Procedure Laterality Date   ablation  04/09/2009   s/p afib and atrial flutter ablation by [facility]   ABLATION OF DYSRHYTHMIC FOCUS  10/15/09   repeat afib ablation by [facility]   APPENDECTOMY     CATARACT EXTRACTION     CATARACT EXTRACTION, BILATERAL     with lens implants    COLONOSCOPY     CORNEAL LACERATION REPAIR     EYE SURGERY     INGUINAL HERNIA REPAIR     MASS EXCISION Left 12/20/2020   Procedure: EXCISION  SOFT TISSUE MASS LEFT FLANK;  Surgeon: Darnell Level, MD;  Location: [facility] [place];  Service: General;  Laterality: Left;   POLYPECTOMY     TONSILLECTOMY     VASECTOMY      Family History  Problem Relation Age of Onset   Liver cancer Father    Prostate cancer Father    Coronary artery disease Father    COPD Mother    Breast cancer Sister    Colon cancer Neg Hx    Rectal cancer Neg Hx    Stomach cancer Neg Hx    Esophageal cancer Neg Hx    Colon polyps Neg Hx       Social History:  reports that he has never smoked. He has never used smokeless tobacco. He reports that he does not drink alcohol and does not use drugs.  Allergies  Allergen Reactions   Warfarin And Related Other (See Comments)    Intercranial bleed   Ace Inhibitors Cough    HOME MEDICATIONS:                                                                                                                      No current facility-administered medications on file prior to encounter.   Current Outpatient Medications on File Prior to Encounter  Medication Sig Dispense Refill   acetaminophen (TYLENOL) 325 MG tablet Take 650 mg by mouth every 6 (six) hours as needed for mild pain.     allopurinol (ZYLOPRIM) 100 MG tablet Take 100 mg by mouth at bedtime.       Ascorbic Acid (VITAMIN C) 1000 MG tablet Take 1,000 mg by mouth daily.      aspirin EC 81 MG tablet Take 1 tablet (81 mg total) by mouth daily. Swallow whole. 30 tablet 11   cycloSPORINE (RESTASIS) 0.05 % ophthalmic emulsion Place 1 drop into both eyes 2 (two) times daily as needed (dry eyes, irritation).     diclofenac Sodium (VOLTAREN) 1 % GEL Apply 2-4 g topically 4 (four) times daily. (Patient taking differently: Apply 2-4 g topically 4 (four) times daily as needed (arthritis pain).) 200 g 3   diltiazem (CARDIZEM CD) 240 MG 24 hr capsule TAKE 1 CAPSULE DAILY. PLEASE CALL OFFICE TO SCHEDULE APPOINTMENT FOR FURTHER REFILLS (Patient taking differently: Take 240 mg by mouth daily.) 15 capsule 0   docusate sodium (COLACE) 100 MG capsule Take 100 mg by mouth daily as needed for mild constipation.     donepezil (ARICEPT)  10 MG tablet TAKE ONE TABLET BY MOUTH EVERYDAY AT BEDTIME (Patient taking differently: Take 10 mg by mouth at bedtime.) 90 tablet 1   escitalopram (LEXAPRO) 10 MG tablet Take 10 mg by mouth daily.     famotidine (PEPCID) 20 MG tablet Take 20 mg by mouth at bedtime as needed for heartburn or indigestion.     hydroxyurea (HYDREA) 500 MG capsule TAKE ONE CAPSULE BY MOUTH AT NOON 90 capsule 2   LORazepam (ATIVAN) 1 MG tablet Take 0.5-1 tablets (0.5-1 mg total) by mouth 2 (two) times daily as needed for anxiety. 25 tablet 0   losartan (COZAAR) 25 MG tablet Take 1 tablet (25 mg total) by mouth daily. Please schedule appointment for further refills. 3rd and final attempt. 15 tablet 0   memantine  (NAMENDA) 10 MG tablet TAKE ONE TABLET BY MOUTH AT NOON and TAKE ONE TABLET BY MOUTH EVERYDAY AT BEDTIME (Patient taking differently: Take 10 mg by mouth 2 (two) times daily.) 180 tablet 2   methocarbamol (ROBAXIN) 500 MG tablet Take 1 tablet (500 mg total) by mouth every 8 (eight) hours as needed for muscle spasms. 60 tablet 2   Olopatadine HCl 0.2 % SOLN Place 1 drop into both eyes in the morning and at bedtime.     OVER THE COUNTER MEDICATION Place 1 spray into both nostrils daily as needed (congestion). Congestion nasal spray     pantoprazole (PROTONIX) 40 MG tablet Take 1 tablet (40 mg total) by mouth daily. 90 tablet 3   rosuvastatin (CRESTOR) 10 MG tablet Take 10 mg by mouth daily.  2   vitamin B-12 (CYANOCOBALAMIN) 1000 MCG tablet Take 1,000 mcg by mouth daily.        ROS:                                                                                                                                       Unable to obtain due to AMS.    Blood pressure (!) 140/75, pulse 69, temperature 97.6 F (36.4 C), temperature source Oral, resp. rate 20, height 6\' 1"  (1.854 m), weight 73.5 kg, SpO2 95%.   General Examination:                                                                                                       Physical Exam  HEENT-  Normocephalic, no lesions, without obvious abnormality.  Normal external eye and conjunctiva.   Cardiovascular- S1-S2 audible, pulses palpable throughout  Lungs-no rhonchi or wheezing noted, no excessive working breathing.  Saturations within normal limits Abdomen- All 4 quadrants palpated and nontender Extremities- Warm, dry and intact Musculoskeletal-no joint tenderness, deformity or swelling Skin-warm and dry, no hyperpigmentation, vitiligo, or suspicious lesions  Neurological Examination Mental Status:  Awake but with decreased alertness/responsiveness to questions and commands. Following some commands, but still mute in CT. After returning  to room, patient began interacting with staff more, asking staff to "quit it" during exam. Not able to state his age or the month. Unable to follow 2 commands while in CT.  Cranial Nerves: II: Temporal visual fields intact with no extinction to DSS III,IV, VI: ptosis not present, extra-ocular motions intact bilaterally pupils equal, round, reactive to light and accommodation V,VII: smile symmetric, facial light touch sensation normal bilaterally VIII: hearing normal bilaterally IX,X: uvula rises symmetrically XI: bilateral shoulder shrug XII: midline tongue extension Motor: Right : Upper extremity   5/5    Left:     Upper extremity   5/5  Lower extremity   5/5     Lower extremity   5/5 Normal tone throughout; no atrophy noted Sensory: Light touch intact throughout, bilaterally Deep Tendon Reflexes: 2+ and symmetric throughout Plantars: Right: downgoing   Left: downgoing Cerebellar: No ataxia with FNF bilaterally Gait: Deferred  NIHSS: 12   Lab Results: Basic Metabolic Panel: Recent Labs  Lab 10/07/22 2125 10/09/22 1302  NA 141 146*  K 3.4* 3.4*  CL 109 108  CO2 23  --   GLUCOSE 96 108*  BUN 15 15  CREATININE 1.25* 1.60*  CALCIUM 9.6  --     CBC: Recent Labs  Lab 10/07/22 2125 10/09/22 1230 10/09/22 1302  WBC 8.3 7.6  --   NEUTROABS  --  5.8  --   HGB 16.8 15.9 16.3  HCT 53.6* 50.2 48.0  MCV 87.4 86.6  --   PLT 382 341  --     Cardiac Enzymes: No results for input(s): "CKTOTAL", "CKMB", "CKMBINDEX", "TROPONINI" in the last 168 hours.  Lipid Panel: No results for input(s): "CHOL", "TRIG", "HDL", "CHOLHDL", "VLDL", "LDLCALC" in the last 168 hours.  Imaging: CT ABDOMEN PELVIS W CONTRAST  Result Date: 10/07/2022 CLINICAL DATA:  Acute nonlocalized abdominal pain EXAM: CT ABDOMEN AND PELVIS WITH CONTRAST TECHNIQUE: Multidetector CT imaging of the abdomen and pelvis was performed using the standard protocol following bolus administration of intravenous contrast.  RADIATION DOSE REDUCTION: This exam was performed according to the departmental dose-optimization program which includes automated exposure control, adjustment of the mA and/or kV according to patient size and/or use of iterative reconstruction technique. CONTRAST:  OMNIPAQUE IOHEXOL 300 MG/ML  SOLN COMPARISON:  02/28/2008 FINDINGS: Lower chest: Lung bases are clear. Hepatobiliary: Cystic change in the gallbladder consistent with adenomyosis. Subcentimeter low-attenuation lesions in the liver are unchanged since prior study, likely cysts. No cholelithiasis or bile duct dilatation. Pancreas: Unremarkable. No pancreatic ductal dilatation or surrounding inflammatory changes. Spleen: Normal in size without focal abnormality. Adrenals/Urinary Tract: No adrenal gland nodules. Multiple bilateral renal cysts. Largest on the right measures 3.4 cm diameter. There is mild progression since the prior study but the appearance is that of simple cysts. This is likely benign and no imaging follow-up is indicated. Nephrograms are symmetrical. No hydronephrosis or hydroureter. Bladder is normal. Stomach/Bowel: Stomach, small bowel, and colon are not abnormally distended. No wall thickening or inflammatory changes are appreciated. Previous cecal mass or thickening have resolved or been resected in the interval. Appendix is  surgically absent. Vascular/Lymphatic: Aortic atherosclerosis. No enlarged abdominal or pelvic lymph nodes. Reproductive: Prostate gland is mildly enlarged. Other: No free air or free fluid in the abdomen. Small periumbilical hernia containing fat. Musculoskeletal: Degenerative changes in the spine. Lumbar scoliosis convex towards the left. No acute bony abnormalities. IMPRESSION: 1. No acute process demonstrated in the abdomen or pelvis. No evidence of bowel obstruction or inflammation. 2. Adenomyosis of the gallbladder. 3. Aortic atherosclerosis. 4. Prostate gland is enlarged. Electronically Signed   By:  Burman Nieves M.D.   On: 10/07/2022 23:16   CT Head Wo Contrast  Result Date: 10/07/2022 CLINICAL DATA:  Altered mental status EXAM: CT HEAD WITHOUT CONTRAST TECHNIQUE: Contiguous axial images were obtained from the base of the skull through the vertex without intravenous contrast. RADIATION DOSE REDUCTION: This exam was performed according to the departmental dose-optimization program which includes automated exposure control, adjustment of the mA and/or kV according to patient size and/or use of iterative reconstruction technique. COMPARISON:  09/28/2022 FINDINGS: Brain: No evidence of acute infarction, hemorrhage, hydrocephalus, extra-axial collection or mass lesion/mass effect. Old right occipital infarct. There is periventricular hypoattenuation compatible with chronic microvascular disease. Vascular: Calcific atherosclerosis at the skull base. Skull: Negative Sinuses/Orbits: Paranasal sinuses are clear. No mastoid effusion. Normal orbits. Other: None IMPRESSION: 1. No acute intracranial abnormality. 2. Old right occipital infarct and findings of chronic microvascular disease. Electronically Signed   By: Deatra Robinson M.D.   On: 10/07/2022 23:09   DG Chest Port 1 View  Result Date: 10/07/2022 CLINICAL DATA:  Altered mental status EXAM: PORTABLE CHEST 1 VIEW COMPARISON:  04/13/2022 FINDINGS: Cardiac shadow is within normal limits. Aortic calcifications are noted. Lungs are well aerated bilaterally. No bony abnormality is noted. IMPRESSION: No active disease. Electronically Signed   By: Alcide Clever M.D.   On: 10/07/2022 22:13     Assessment: 87 year old male with a history of ICH presenting with acute onset of global aphasia and RUE weakness - Exam in CT with NIHSS 12. No facial droop, focal motor weakness, sensory loss or ataxia noted.  - CT head: No acute intracranial abnormality. Old right occipital infarct and findings of chronic microvascular disease - Glucose 108, BUN 14, Cr 1.44, eGFR 48,  AST and ALT normal, WBC normal, EtOH < 10.   - CTA of head and neck: Overall mild atherosclerotic plaque in the vasculature of the head and neck with no hemodynamically significant stenosis or occlusion.  - Not a TNK candidate due to low suspicion for stroke - Not a thrombectomy candidate due to no LVO on CTA - DDx for his presentation includes cognitive fluctuation in the setting of dementia, unwitnessed seizure with postictal state, behavioral change and TIA   Recommendations: - MRI without contrast - Routine EEG - Further recommendations following the results of the above.   Addendum: MRI brain: No acute intracranial abnormality or mass. 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     Electronically signed: Dr. Caryl Pina 10/09/2022, 1:12 PM

## 2022-10-09 NOTE — ED Notes (Signed)
No change noted after Narcan administration

## 2022-10-09 NOTE — H&P (Signed)
History and Physical    Dean Pratt GEX:[id number] DOB: 06/21/1935 DOA: 10/09/2022  PCP: [name], MD  Patient coming from: [facility] IL[facility]  I have personally briefly reviewed patient's old medical records in [facility] Link  Chief Complaint: Altered mental status  HPI: Dean Pratt is a 87 y.o. 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  Patient currently resides in the independent living portion of [facility] and was actually transitioning to the assisted living portion earlier today.  He was in the bathroom this morning sitting on a stool when he began to brush his teeth.  His spouse stepped out of the bathroom for a moment and then when she returned saw him with his head laid down on the sink.  He was seemingly unconscious and his spouse was unable to arouse him.  EMS were called and he was brought to the ED.  On arrival patient was still difficult to arouse, only responsive to painful stimuli.  He arrived as a code stroke and was seen by neurology.  Since then mentation has improved and he is conversant and AO x 4.  Patient has had issues with recent deconditioning, usually ambulates with the cane but has been requiring a walker in the last couple weeks.  Has been in and out of the ED for confusion and muscle spasms in the legs.  ED Course  Labs/Imaging on admission: I have personally reviewed following labs and imaging studies.  Initial vitals showed BP 140/75, pulse 60, RR 20, temp 97.6 F, SpO2 95% on room air.  Labs show sodium 145, potassium 3.5, bicarb 25, BUN 14, creatinine 1.44, serum glucose 112, LFTs within normal limits, WBC 7.6, hemoglobin 15.9, platelets 341,000, troponin 14.  Serum ethanol <10.  UDS and UA  pending collection.  CT head without contrast negative for acute intracranial pathology.  CTA head/neck showed overall mild atherosclerotic plaque in the vasculature of the head and neck with no hemodynamically significant stenosis or occlusion.  Patient was given IV Narcan 0.4 mg, IV Ativan 0.5 mg.  EDP discussed with neurology who recommended medical admission and will see in consultation.  The hospitalist service was consulted to admit for further evaluation and management.  Review of Systems: All systems reviewed and are negative except as documented in history of present illness above.   Past Medical History:  Diagnosis Date   Alzheimer dementia (HCC)    Anxiety    Blood transfusion without reported diagnosis    BPH (benign prostatic hyperplasia)    Cancer (HCC)    basal cell CA removed   Cataract    removed with lens implants    CHF (congestive heart failure) (HCC)    Colitis, ischemic (HCC)    secondary to ebolism from afib 1/10   Constipation    GERD (gastroesophageal reflux disease)    Gout    HTN (hypertension)    Hyperlipidemia    Intracranial bleed (HCC) 04/10/2010   Right occipital hematoma with a left homonymous hemianopsia    Migraines    Paroxysmal atrial fibrillation (HCC)    s/p PVI 04/09/09 and 10/17/10   Rosacea    Stroke (HCC) 04/08/2010   hemorragic, anticoagulation stopped at that time   Tuberculosis    s/p treatment 1964  Vascular dementia [facility])     Past Surgical History:  Procedure Laterality Date   ablation  04/09/2009   s/p afib and atrial flutter ablation by [facility]   ABLATION OF DYSRHYTHMIC FOCUS  10/15/09   repeat afib ablation by [facility]   APPENDECTOMY     CATARACT EXTRACTION     CATARACT EXTRACTION, BILATERAL     with lens implants    COLONOSCOPY     CORNEAL LACERATION REPAIR     EYE SURGERY     INGUINAL HERNIA REPAIR     MASS EXCISION Left 12/20/2020   Procedure: EXCISION  SOFT TISSUE MASS LEFT FLANK;  Surgeon: Darnell Level, MD;  Location:  [facility];  Service: General;  Laterality: Left;   POLYPECTOMY     TONSILLECTOMY     VASECTOMY      Social History:  reports that he has never smoked. He has never used smokeless tobacco. He reports that he does not drink alcohol and does not use drugs.  Allergies  Allergen Reactions   Warfarin And Related Other (See Comments)    Intercranial bleed   Ace Inhibitors Cough    Family History  Problem Relation Age of Onset   Liver cancer Father    Prostate cancer Father    Coronary artery disease Father    COPD Mother    Breast cancer Sister    Colon cancer Neg Hx    Rectal cancer Neg Hx    Stomach cancer Neg Hx    Esophageal cancer Neg Hx    Colon polyps Neg Hx      Prior to Admission medications   Medication Sig Start Date End Date Taking? Authorizing Provider  acetaminophen (TYLENOL) 325 MG tablet Take 650 mg by mouth every 6 (six) hours as needed for mild pain.    [provider]  allopurinol (ZYLOPRIM) 100 MG tablet Take 100 mg by mouth at bedtime.      [provider]  Ascorbic Acid (VITAMIN C) 1000 MG tablet Take 1,000 mg by mouth daily.     [provider]  aspirin EC 81 MG tablet Take 1 tablet (81 mg total) by mouth daily. Swallow whole. 03/13/21   Micki Riley, MD  cycloSPORINE (RESTASIS) 0.05 % ophthalmic emulsion Place 1 drop into both eyes 2 (two) times daily as needed (dry eyes, irritation). 03/15/18   [provider]  diclofenac Sodium (VOLTAREN) 1 % GEL Apply 2-4 g topically 4 (four) times daily. Patient taking differently: Apply 2-4 g topically 4 (four) times daily as needed (arthritis pain). 09/15/19   Kathryne Hitch, MD  diltiazem (CARDIZEM CD) 240 MG 24 hr capsule TAKE 1 CAPSULE DAILY. PLEASE CALL OFFICE TO SCHEDULE APPOINTMENT FOR FURTHER REFILLS Patient taking differently: Take 240 mg by mouth daily. 05/25/19   Georgeanna Lea, MD  docusate sodium (COLACE) 100 MG capsule Take 100 mg by mouth  daily as needed for mild constipation. 01/20/11   [provider]  donepezil (ARICEPT) 10 MG tablet TAKE ONE TABLET BY MOUTH EVERYDAY AT BEDTIME Patient taking differently: Take 10 mg by mouth at bedtime. 07/13/22   Ihor Austin, NP  escitalopram (LEXAPRO) 10 MG tablet Take 10 mg by mouth daily.    [provider]  famotidine (PEPCID) 20 MG tablet Take 20 mg by mouth at bedtime as needed for heartburn or indigestion. 05/16/20   [provider]  hydroxyurea (HYDREA) 500 MG capsule TAKE ONE CAPSULE BY MOUTH AT [facility] 05/12/22  Si Gaul, MD  LORazepam (ATIVAN) 1 MG tablet Take 0.5-1 tablets (0.5-1 mg total) by mouth 2 (two) times daily as needed for anxiety. 01/14/21   Osvaldo Shipper, MD  losartan (COZAAR) 25 MG tablet Take 1 tablet (25 mg total) by mouth daily. Please schedule appointment for further refills. 3rd and final attempt. 08/04/22   Georgeanna Lea, MD  memantine (NAMENDA) 10 MG tablet TAKE ONE TABLET BY MOUTH AT NOON and TAKE ONE TABLET BY MOUTH EVERYDAY AT BEDTIME Patient taking differently: Take 10 mg by mouth 2 (two) times daily. 01/08/22   Micki Riley, MD  methocarbamol (ROBAXIN) 500 MG tablet Take 1 tablet (500 mg total) by mouth every 8 (eight) hours as needed for muscle spasms. 05/30/19   Tyrell Antonio, MD  Olopatadine HCl 0.2 % SOLN Place 1 drop into both eyes in the morning and at bedtime. 07/10/18   [provider]  OVER THE COUNTER MEDICATION Place 1 spray into both nostrils daily as needed (congestion). Congestion nasal spray    [provider]  pantoprazole (PROTONIX) 40 MG tablet Take 1 tablet (40 mg total) by mouth daily. 09/02/20   Hilarie Fredrickson, MD  rosuvastatin (CRESTOR) 10 MG tablet Take 10 mg by mouth daily. 09/06/17   [provider]  vitamin B-12 (CYANOCOBALAMIN) 1000 MCG tablet Take 1,000 mcg by mouth daily.    [provider]    Physical Exam: Vitals:   10/09/22 1345 10/09/22 1400 10/09/22 1415  10/09/22 1530  BP: (!) 149/69 (!) 137/95 (!) 143/75 120/78  Pulse: 63 88 (!) 57 60  Resp: 20 12 18  (!) 21  Temp:      TempSrc:      SpO2: 94% 99% 95% 96%  Weight:      Height:       Constitutional: Resting in bed, NAD, calm, comfortable Eyes: PERRL, EOMI, lids and conjunctivae normal ENMT: Mucous membranes are moist. Posterior pharynx clear of any exudate or lesions.Normal dentition.  Neck: normal, supple, no masses. Respiratory: clear to auscultation bilaterally, no wheezing, no crackles. Normal respiratory effort. No accessory muscle use.  Cardiovascular: Irregularly irregular, no murmurs / rubs / gallops. No extremity edema. 2+ pedal pulses. Abdomen: no tenderness, no masses palpated.  Musculoskeletal: no clubbing / cyanosis. No joint deformity upper and lower extremities. Good ROM, no contractures. Normal muscle tone.  Skin: no rashes, lesions, ulcers. No induration Neurologic: CN 2-12 grossly intact. Sensation intact. Strength 5/5 in all 4.  Psychiatric: Alert and oriented x 3. Normal mood.   EKG: Personally reviewed. Atrial fibrillation, rate 68, PVCs present.  PVCs new when compared to prior.  Assessment/Plan Principal Problem:   Acute encephalopathy Active Problems:   Chronic atrial fibrillation (HCC)   AKI (acute kidney injury) (HCC)   Hyperlipidemia   Essential hypertension   Polycythemia vera (HCC)   Vascular dementia (HCC)   Dean Pratt is a 87 y.o. 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  Neurology consulting.  Assessment and Plan: Acute encephalopathy with aphasia and RUE weakness History of embolic CVA and remote ICH: Initially encephalopathic and unresponsive.  Mentation improved at  time of admission without focal deficits.  Neurology following.  Potential seizure activity with postictal state versus CVA. -Follow MRI brain without  contrast -CTA head/neck negative for LVO or hemodynamically significant stenosis -Follow EEG -Continue home aspirin 81 mg daily, further antiplatelet adjustment per neurology -PT/OT/SLP eval -Keep on telemetry, continue neurochecks -Seizure precautions -Allow permissive hypertension for now  Chronic atrial fibrillation: Remains in atrial fibrillation with controlled rate.  Holding diltiazem for now.  Not on anticoagulation due to history of hemorrhagic stroke in microhemorrhages seen on previous MRI brain.  Continue aspirin 81 mg daily.  Acute kidney injury: Creatinine 1.44 on admission compared to baseline around 1.1-1.2.  Continue IV fluid hydration overnight and recheck labs in AM.  Holding NSAIDs, ARB.  Hypertension: BP stable, holding home meds to allow permissive hypertension for now.  Polycythemia vera: Chronic and stable.  Follows with heme-onc.  Continue hydroxyurea and aspirin 81 mg daily.  Hyperlipidemia: Continue rosuvastatin.  Vascular dementia: Seems to be back at his baseline at time of admission.  Continue Aricept and Namenda.  He was actually supposed to transition from independent living to assisted living portion of his facility day of admission.   DVT prophylaxis: enoxaparin (LOVENOX) injection 40 mg Start: 10/10/22 2200 Code Status: Full code, discussed and confirmed with patient and daughter/HCPOA at bedside on admission Family Communication: Spouse and daughter at bedside Disposition Plan: From [facility] with plans to transition to ALF portion Consults called: Neurology Severity of Illness: The appropriate patient status for this patient is OBSERVATION. Observation status is judged to be reasonable and necessary in order to provide the required intensity of service to ensure the patient's safety. The  patient's presenting symptoms, physical exam findings, and initial radiographic and laboratory data in the context of their medical condition is felt to place them at decreased risk for further clinical deterioration. Furthermore, it is anticipated that the patient will be medically stable for discharge from the hospital within 2 midnights of admission.   Darreld Mclean MD [facility]  If 7PM-7AM, please contact night-coverage [email or web address]  10/09/2022, 4:29 PM

## 2022-10-09 NOTE — ED Notes (Signed)
ED TO INPATIENT HANDOFF REPORT  ED Nurse Name and Phone #: chris [phone]  S Name/Age/Gender Dean Pratt 87 y.o. male Room/Bed: 003C/003C  Code Status   Code Status: Full Code  Home/SNF/Other Nursing Homenursing home  Patient oriented to: self, place, time, and situation Is this baseline? Yes   Triage Complete: Triage complete  Chief Complaint Acute encephalopathy [G93.40]  Triage Note Pt arrives via EMS from [facility]. Pt in bathroom brushing teeth, wife found pt with face on sink, still standing. EMS states pt with hx periods of unresponsiveness, but they dont last long. Pt also having tremors and jerking which is baseline for pt per EMS.  Pt with hx afib. 92% on RA- improved on 3L [place] for EMS 20 L AC   Allergies Allergies  Allergen Reactions   Warfarin And Related Other (See Comments)    Intercranial bleed   Ace Inhibitors Cough    Level of Care/Admitting Diagnosis ED Disposition     ED Disposition  Admit   Condition  --   Comment  Hospital Area: [facility] [[id number]]  Level of Care: Telemetry Medical [104]  May place patient in observation at [facility] or [facility] if equivalent level of care is available:: No  Covid Evaluation: Asymptomatic - no recent exposure (last 10 days) testing not required  Diagnosis: Acute encephalopathy [409811]  Admitting Physician: Charlsie Quest [[id number]]  Attending Physician: Charlsie Quest [[id number]]          B Medical/Surgery History Past Medical History:  Diagnosis Date   Alzheimer dementia (HCC)    Anxiety    Blood transfusion without reported diagnosis    BPH (benign prostatic hyperplasia)    Cancer (HCC)    basal cell CA removed   Cataract    removed with lens implants    CHF (congestive heart failure) (HCC)    Colitis, ischemic (HCC)    secondary to ebolism from afib 1/10   Constipation    GERD (gastroesophageal reflux disease)    Gout    HTN (hypertension)     Hyperlipidemia    Intracranial bleed (HCC) 04/10/2010   Right occipital hematoma with a left homonymous hemianopsia    Migraines    Paroxysmal atrial fibrillation (HCC)    s/p PVI 04/09/09 and 10/17/10   Rosacea    Stroke (HCC) 04/08/2010   hemorragic, anticoagulation stopped at that time   Tuberculosis    s/p treatment 1964   Vascular dementia [facility])    Past Surgical History:  Procedure Laterality Date   ablation  04/09/2009   s/p afib and atrial flutter ablation by JA   ABLATION OF DYSRHYTHMIC FOCUS  10/15/09   repeat afib ablation by JA   APPENDECTOMY     CATARACT EXTRACTION     CATARACT EXTRACTION, BILATERAL     with lens implants    COLONOSCOPY     CORNEAL LACERATION REPAIR     EYE SURGERY     INGUINAL HERNIA REPAIR     MASS EXCISION Left 12/20/2020   Procedure: EXCISION  SOFT TISSUE MASS LEFT FLANK;  Surgeon: Darnell Level, MD;  Location: [facility];  Service: General;  Laterality: Left;   POLYPECTOMY     TONSILLECTOMY     VASECTOMY       A IV Location/Drains/Wounds Patient Lines/Drains/Airways Status     Active Line/Drains/Airways     Name Placement date Placement time Site Days   Peripheral IV 10/07/22 20 G  Anterior;Distal;Left;Upper Arm 10/07/22  2139  Arm  2   Peripheral IV 10/09/22 20 G Left Antecubital 10/09/22  1243  Antecubital  less than 1            Intake/Output Last 24 hours No intake or output data in the 24 hours ending 10/09/22 1521  Labs/Imaging Results for orders placed or performed during the hospital encounter of 10/09/22 (from the past 48 hour(s))  CBG monitoring, ED     Status: Abnormal   Collection Time: 10/09/22 12:30 PM  Result Value Ref Range   Glucose-Capillary 112 (H) 70 - 99 mg/dL    Comment: Glucose reference range applies only to samples taken after fasting for at least 8 hours.  Ethanol     Status: None   Collection Time: 10/09/22 12:30 PM  Result Value Ref Range   Alcohol, Ethyl (B) <10 <10 mg/dL     Comment: (NOTE) Lowest detectable limit for serum alcohol is 10 mg/dL.  For medical purposes only. Performed at [facility], [address], [place], [place] [address]   Protime-INR     Status: Abnormal   Collection Time: 10/09/22 12:30 PM  Result Value Ref Range   Prothrombin Time 15.6 (H) 11.4 - 15.2 seconds   INR 1.2 0.8 - 1.2    Comment: (NOTE) INR goal varies based on device and disease states. Performed at [facility], [address] [place], [place] [address]   APTT     Status: None   Collection Time: 10/09/22 12:30 PM  Result Value Ref Range   aPTT 32 24 - 36 seconds    Comment: Performed at [facility], [address] [place], [place] [address]  CBC     Status: Abnormal   Collection Time: 10/09/22 12:30 PM  Result Value Ref Range   WBC 7.6 4.0 - 10.5 K/uL   RBC 5.80 4.22 - 5.81 MIL/uL   Hemoglobin 15.9 13.0 - 17.0 g/dL   HCT 91.4 78.2 - 95.6 %   MCV 86.6 80.0 - 100.0 fL   MCH 27.4 26.0 - 34.0 pg   MCHC 31.7 30.0 - 36.0 g/dL   RDW 21.3 (H) 08.6 - 57.8 %   Platelets 341 150 - 400 K/uL   nRBC 0.0 0.0 - 0.2 %    Comment: Performed at [facility], [address] [place], [place] [address]  Differential     Status: None   Collection Time: 10/09/22 12:30 PM  Result Value Ref Range   Neutrophils Relative % 76 %   Neutro Abs 5.8 1.7 - 7.7 K/uL   Lymphocytes Relative 12 %   Lymphs Abs 0.9 0.7 - 4.0 K/uL   Monocytes Relative 8 %   Monocytes Absolute 0.6 0.1 - 1.0 K/uL   Eosinophils Relative 2 %   Eosinophils Absolute 0.1 0.0 - 0.5 K/uL   Basophils Relative 1 %   Basophils Absolute 0.1 0.0 - 0.1 K/uL   Immature Granulocytes 1 %   Abs Immature Granulocytes 0.04 0.00 - 0.07 K/uL    Comment: Performed at [facility], [address], [place], [place] [address]  Comprehensive metabolic panel     Status: Abnormal   Collection Time: 10/09/22 12:30 PM  Result Value Ref Range   Sodium 145 135 - 145 mmol/L   Potassium 3.5 3.5 - 5.1  mmol/L   Chloride 107 98 - 111 mmol/L   CO2 25 22 - 32 mmol/L   Glucose,  Bld 112 (H) 70 - 99 mg/dL    Comment: Glucose reference range applies only to samples taken after fasting for at least 8 hours.   BUN 14 8 - 23 mg/dL   Creatinine, Ser 9.60 (H) 0.61 - 1.24 mg/dL   Calcium 9.6 8.9 - 45.4 mg/dL   Total Protein 6.4 (L) 6.5 - 8.1 g/dL   Albumin 3.8 3.5 - 5.0 g/dL   AST 21 15 - 41 U/L   ALT 14 0 - 44 U/L   Alkaline Phosphatase 61 38 - 126 U/L   Total Bilirubin 0.5 0.3 - 1.2 mg/dL   GFR, Estimated 48 (L) >60 mL/min    Comment: (NOTE) Calculated using the CKD-EPI Creatinine Equation (2021)    Anion gap 13 5 - 15    Comment: Performed at [facility], [address], [place], [place] [address]  Troponin I (High Sensitivity)     Status: None   Collection Time: 10/09/22 12:30 PM  Result Value Ref Range   Troponin I (High Sensitivity) 14 <18 ng/L    Comment: (NOTE) Elevated high sensitivity troponin I (hsTnI) values and significant  changes across serial measurements may suggest ACS but many other  chronic and acute conditions are known to elevate hsTnI results.  Refer to the "Links" section for chest pain algorithms and additional  guidance. Performed at [facility], [address] [place], [place] [address]   I-stat chem 8, ED     Status: Abnormal   Collection Time: 10/09/22  1:02 PM  Result Value Ref Range   Sodium 146 (H) 135 - 145 mmol/L   Potassium 3.4 (L) 3.5 - 5.1 mmol/L   Chloride 108 98 - 111 mmol/L   BUN 15 8 - 23 mg/dL   Creatinine, Ser 2.95 (H) 0.61 - 1.24 mg/dL   Glucose, Bld 621 (H) 70 - 99 mg/dL    Comment: Glucose reference range applies only to samples taken after fasting for at least 8 hours.   Calcium, Ion 1.26 1.15 - 1.40 mmol/L   TCO2 26 22 - 32 mmol/L   Hemoglobin 16.3 13.0 - 17.0 g/dL   HCT 30.8 65.7 - 84.6 %   CT ANGIO HEAD NECK W WO CM (CODE STROKE)  Result Date: 10/09/2022 CLINICAL DATA:  Code stroke EXAM: CT ANGIOGRAPHY HEAD AND  NECK TECHNIQUE: Multidetector CT imaging of the head and neck was performed using the standard protocol during bolus administration of intravenous contrast. Multiplanar CT image reconstructions and MIPs were obtained to evaluate the vascular anatomy. Carotid stenosis measurements (when applicable) are obtained utilizing NASCET criteria, using the distal internal carotid diameter as the denominator. RADIATION DOSE REDUCTION: This exam was performed according to the departmental dose-optimization program which includes automated exposure control, adjustment of the mA and/or kV according to patient size and/or use of iterative reconstruction technique. CONTRAST:  75mL OMNIPAQUE IOHEXOL 350 MG/ML SOLN COMPARISON:  CT head 2 days prior, CTA head and neck 01/10/2021 FINDINGS: CT HEAD FINDINGS Brain: There is no acute intracranial hemorrhage, extra-axial fluid collection, or acute infarct Background parenchymal volume loss with prominence of the ventricular system and extra-axial CSF spaces is unchanged. A remote right occipital infarct is unchanged. Additional confluent hypodensity in the supratentorial white matter likely reflecting sequela of underlying chronic small-vessel ischemic change is stable. Gray-white differentiation is preserved The pituitary and suprasellar region are normal. There is no mass lesion. There is no mass effect or midline shift. Vascular: See below. Skull: Normal. Negative for  fracture or focal lesion. Sinuses/Orbits: The paranasal sinuses are clear. Bilateral lens implants are in place. The globes and orbits are otherwise unremarkable. Other: The mastoid air cells and middle ear cavities are clear. Review of the MIP images confirms the above findings CTA NECK FINDINGS Aortic arch: There is calcified plaque in the imaged aortic arch. The origins of the major branch vessels are patent. The subclavian arteries are patent to the level imaged with mild stenosis proximally on the right. Right carotid  system: The right common, internal, and external carotid arteries are patent, with mild plaque at the bifurcation but no significant stenosis or occlusion. There is no evidence of dissection or aneurysm. Left carotid system: The left common, internal, and external carotid arteries are patent, with mild plaque at the bifurcation but no significant stenosis or occlusion there is no evidence of dissection or aneurysm. Vertebral arteries: The vertebral arteries are patent, without significant stenosis or occlusion there is no evidence of dissection or aneurysm. Skeleton: There is no acute osseous abnormality or suspicious osseous lesion. Advanced multilevel degenerative change throughout the cervical spine with mild anterolisthesis of C4 on C5 and mild compression of the C5 vertebral body are unchanged. There is no visible canal hematoma. Other neck: The soft tissues of the neck are unremarkable. Upper chest: There is emphysema in the lung apices. Review of the MIP images confirms the above findings CTA HEAD FINDINGS Anterior circulation: There is calcified plaque in the intracranial ICAs without significant stenosis or occlusion. The bilateral MCAs and ACAS are patent, without proximal stenosis or occlusion. The anterior communicating artery is normal There is no aneurysm or AVM. Posterior circulation: There is calcified plaque in the bilateral V4 segments without significant stenosis or occlusion. The basilar artery is patent. The right PICA origin is not definitely seen. The other major cerebellar arteries appear patent. The bilateral PCAs are patent, without proximal stenosis or occlusion. Posterior communicating arteries are not definitely seen. There is no aneurysm or AVM. Dissection. Venous sinuses: Not well evaluated due to bolus timing. Anatomic variants: None. Review of the MIP images confirms the above findings IMPRESSION: 1. No acute intracranial pathology on initial noncontrast head CT. 2. Overall mild  atherosclerotic plaque in the vasculature of the head and neck with no hemodynamically significant stenosis, occlusion, or Findings communicated to Dr Otelia Limes at 1:39 pm. Electronically Signed   By: Lesia Hausen M.D.   On: 10/09/2022 13:47   CT HEAD CODE STROKE WO CONTRAST  Result Date: 10/09/2022 CLINICAL DATA:  Code stroke EXAM: CT ANGIOGRAPHY HEAD AND NECK TECHNIQUE: Multidetector CT imaging of the head and neck was performed using the standard protocol during bolus administration of intravenous contrast. Multiplanar CT image reconstructions and MIPs were obtained to evaluate the vascular anatomy. Carotid stenosis measurements (when applicable) are obtained utilizing NASCET criteria, using the distal internal carotid diameter as the denominator. RADIATION DOSE REDUCTION: This exam was performed according to the departmental dose-optimization program which includes automated exposure control, adjustment of the mA and/or kV according to patient size and/or use of iterative reconstruction technique. CONTRAST:  75mL OMNIPAQUE IOHEXOL 350 MG/ML SOLN COMPARISON:  CT head 2 days prior, CTA head and neck 01/10/2021 FINDINGS: CT HEAD FINDINGS Brain: There is no acute intracranial hemorrhage, extra-axial fluid collection, or acute infarct Background parenchymal volume loss with prominence of the ventricular system and extra-axial CSF spaces is unchanged. A remote right occipital infarct is unchanged. Additional confluent hypodensity in the supratentorial white matter likely reflecting sequela of underlying chronic  small-vessel ischemic change is stable. Gray-white differentiation is preserved The pituitary and suprasellar region are normal. There is no mass lesion. There is no mass effect or midline shift. Vascular: See below. Skull: Normal. Negative for fracture or focal lesion. Sinuses/Orbits: The paranasal sinuses are clear. Bilateral lens implants are in place. The globes and orbits are otherwise unremarkable.  Other: The mastoid air cells and middle ear cavities are clear. Review of the MIP images confirms the above findings CTA NECK FINDINGS Aortic arch: There is calcified plaque in the imaged aortic arch. The origins of the major branch vessels are patent. The subclavian arteries are patent to the level imaged with mild stenosis proximally on the right. Right carotid system: The right common, internal, and external carotid arteries are patent, with mild plaque at the bifurcation but no significant stenosis or occlusion. There is no evidence of dissection or aneurysm. Left carotid system: The left common, internal, and external carotid arteries are patent, with mild plaque at the bifurcation but no significant stenosis or occlusion there is no evidence of dissection or aneurysm. Vertebral arteries: The vertebral arteries are patent, without significant stenosis or occlusion there is no evidence of dissection or aneurysm. Skeleton: There is no acute osseous abnormality or suspicious osseous lesion. Advanced multilevel degenerative change throughout the cervical spine with mild anterolisthesis of C4 on C5 and mild compression of the C5 vertebral body are unchanged. There is no visible canal hematoma. Other neck: The soft tissues of the neck are unremarkable. Upper chest: There is emphysema in the lung apices. Review of the MIP images confirms the above findings CTA HEAD FINDINGS Anterior circulation: There is calcified plaque in the intracranial ICAs without significant stenosis or occlusion. The bilateral MCAs and ACAS are patent, without proximal stenosis or occlusion. The anterior communicating artery is normal There is no aneurysm or AVM. Posterior circulation: There is calcified plaque in the bilateral V4 segments without significant stenosis or occlusion. The basilar artery is patent. The right PICA origin is not definitely seen. The other major cerebellar arteries appear patent. The bilateral PCAs are patent,  without proximal stenosis or occlusion. Posterior communicating arteries are not definitely seen. There is no aneurysm or AVM. Dissection. Venous sinuses: Not well evaluated due to bolus timing. Anatomic variants: None. Review of the MIP images confirms the above findings IMPRESSION: 1. No acute intracranial pathology on initial noncontrast head CT. 2. Overall mild atherosclerotic plaque in the vasculature of the head and neck with no hemodynamically significant stenosis, occlusion, or Findings communicated to Dr Otelia Limes at 1:39 pm. Electronically Signed   By: Lesia Hausen M.D.   On: 10/09/2022 13:47   CT ABDOMEN PELVIS W CONTRAST  Result Date: 10/07/2022 CLINICAL DATA:  Acute nonlocalized abdominal pain EXAM: CT ABDOMEN AND PELVIS WITH CONTRAST TECHNIQUE: Multidetector CT imaging of the abdomen and pelvis was performed using the standard protocol following bolus administration of intravenous contrast. RADIATION DOSE REDUCTION: This exam was performed according to the departmental dose-optimization program which includes automated exposure control, adjustment of the mA and/or kV according to patient size and/or use of iterative reconstruction technique. CONTRAST:  OMNIPAQUE IOHEXOL 300 MG/ML  SOLN COMPARISON:  02/28/2008 FINDINGS: Lower chest: Lung bases are clear. Hepatobiliary: Cystic change in the gallbladder consistent with adenomyosis. Subcentimeter low-attenuation lesions in the liver are unchanged since prior study, likely cysts. No cholelithiasis or bile duct dilatation. Pancreas: Unremarkable. No pancreatic ductal dilatation or surrounding inflammatory changes. Spleen: Normal in size without focal abnormality. Adrenals/Urinary Tract: No adrenal  gland nodules. Multiple bilateral renal cysts. Largest on the right measures 3.4 cm diameter. There is mild progression since the prior study but the appearance is that of simple cysts. This is likely benign and no imaging follow-up is indicated.  Nephrograms are symmetrical. No hydronephrosis or hydroureter. Bladder is normal. Stomach/Bowel: Stomach, small bowel, and colon are not abnormally distended. No wall thickening or inflammatory changes are appreciated. Previous cecal mass or thickening have resolved or been resected in the interval. Appendix is surgically absent. Vascular/Lymphatic: Aortic atherosclerosis. No enlarged abdominal or pelvic lymph nodes. Reproductive: Prostate gland is mildly enlarged. Other: No free air or free fluid in the abdomen. Small periumbilical hernia containing fat. Musculoskeletal: Degenerative changes in the spine. Lumbar scoliosis convex towards the left. No acute bony abnormalities. IMPRESSION: 1. No acute process demonstrated in the abdomen or pelvis. No evidence of bowel obstruction or inflammation. 2. Adenomyosis of the gallbladder. 3. Aortic atherosclerosis. 4. Prostate gland is enlarged. Electronically Signed   By: Burman Nieves M.D.   On: 10/07/2022 23:16   CT Head Wo Contrast  Result Date: 10/07/2022 CLINICAL DATA:  Altered mental status EXAM: CT HEAD WITHOUT CONTRAST TECHNIQUE: Contiguous axial images were obtained from the base of the skull through the vertex without intravenous contrast. RADIATION DOSE REDUCTION: This exam was performed according to the departmental dose-optimization program which includes automated exposure control, adjustment of the mA and/or kV according to patient size and/or use of iterative reconstruction technique. COMPARISON:  09/28/2022 FINDINGS: Brain: No evidence of acute infarction, hemorrhage, hydrocephalus, extra-axial collection or mass lesion/mass effect. Old right occipital infarct. There is periventricular hypoattenuation compatible with chronic microvascular disease. Vascular: Calcific atherosclerosis at the skull base. Skull: Negative Sinuses/Orbits: Paranasal sinuses are clear. No mastoid effusion. Normal orbits. Other: None IMPRESSION: 1. No acute intracranial  abnormality. 2. Old right occipital infarct and findings of chronic microvascular disease. Electronically Signed   By: Deatra Robinson M.D.   On: 10/07/2022 23:09   DG Chest Port 1 View  Result Date: 10/07/2022 CLINICAL DATA:  Altered mental status EXAM: PORTABLE CHEST 1 VIEW COMPARISON:  04/13/2022 FINDINGS: Cardiac shadow is within normal limits. Aortic calcifications are noted. Lungs are well aerated bilaterally. No bony abnormality is noted. IMPRESSION: No active disease. Electronically Signed   By: Alcide Clever M.D.   On: 10/07/2022 22:13    Pending Labs Unresulted Labs (From admission, onward)     Start     Ordered   10/09/22 1239  Urine rapid drug screen (hosp performed)  Once,   STAT        10/09/22 1239   10/09/22 1239  Urinalysis, Routine w reflex microscopic -Urine, Clean Catch  Once,   URGENT       Question:  Specimen Source  Answer:  Urine, Clean Catch   10/09/22 1239            Vitals/Pain Today's Vitals   10/09/22 1339 10/09/22 1345 10/09/22 1400 10/09/22 1415  BP: (!) 147/90 (!) 149/69 (!) 137/95 (!) 143/75  Pulse: 64 63 88 (!) 57  Resp: 13 20 12 18   Temp:      TempSrc:      SpO2: 98% 94% 99% 95%  Weight:      Height:        Isolation Precautions No active isolations  Medications Medications  naloxone (NARCAN) injection 0.4 mg (0.4 mg Intravenous Given 10/09/22 1246)  iohexol (OMNIPAQUE) 350 MG/ML injection 75 mL (75 mLs Intravenous Contrast Given 10/09/22 1330)  LORazepam (ATIVAN)  injection 0.5 mg (0.5 mg Intravenous Given 10/09/22 1417)    Mobility non-ambulatory     Focused Assessments Neuro Assessment Handoff:  Swallow screen pass? No  Cardiac Rhythm: Atrial fibrillation NIH Stroke Scale  Dizziness Present: No Headache Present: No Interval:  (code stroke) Level of Consciousness (1a.)   : Alert, keenly responsive LOC Questions (1b. )   : Answers both questions correctly LOC Commands (1c. )   : Performs one task correctly Best Gaze (2. )  :  Normal Visual (3. )  : No visual loss Facial Palsy (4. )    : Normal symmetrical movements Motor Arm, Left (5a. )   : Drift (participating a little bit more at this time) Motor Arm, Right (5b. ) : Drift Motor Leg, Left (6a. )  : Drift Motor Leg, Right (6b. ) : Drift Limb Ataxia (7. ): Absent Sensory (8. )  : Normal, no sensory loss Best Language (9. )  : No aphasia (patient now speaking) Dysarthria (10. ): Normal Extinction/Inattention (11.)   : No Abnormality Complete NIHSS TOTAL: 5 Last date known well: 10/09/22 Last time known well: 1030 (per wife, unclear though) Neuro Assessment: Exceptions to WDL Neuro Checks:   Initial (10/09/22 1253)  Has TPA been given? Yes Temp: 97.6 F (36.4 C) (10/04 1225) Temp Source: Oral (10/04 1225) BP: 143/75 (10/04 1415) Pulse Rate: 57 (10/04 1415) If patient is a Neuro Trauma and patient is going to OR before floor call report to 4N Charge nurse: [phone] or [phone]   R Recommendations: See Admitting Provider Note  Report given to:   Additional Notes:

## 2022-10-09 NOTE — ED Provider Notes (Signed)
[facility] AT [facility] Provider Note   CSN: [id number] Arrival date & time: 10/09/22  1216     History  Chief Complaint  Patient presents with   Altered Mental Status    Dean Pratt is a 87 y.o. male.  87 year old male with a history of right ACA infarct, atrial fibrillation not on anticoagulation, ICH, and vascular dementia who presents emergency department with altered mental status.  History obtained per EMS and the patient's wife who states that he was last known well at 10:30 AM this morning.  He told her that he was not feeling well and sat down by the sink and when she returned he had lost consciousness and was leaning over the sink.  Since then has been nonverbal.  Apparently is alert and oriented at baseline.  Not on blood thinners.  Was seen yesterday for constipation and confusion with a unremarkable head CT and abdomen CT.       Home Medications Prior to Admission medications   Medication Sig Start Date End Date Taking? Authorizing Provider  acetaminophen (TYLENOL) 325 MG tablet Take 650 mg by mouth every 6 (six) hours as needed for mild pain.    [provider]  allopurinol (ZYLOPRIM) 100 MG tablet Take 100 mg by mouth at bedtime.      [provider]  Ascorbic Acid (VITAMIN C) 1000 MG tablet Take 1,000 mg by mouth daily.     [provider]  aspirin EC 81 MG tablet Take 1 tablet (81 mg total) by mouth daily. Swallow whole. 03/13/21   Micki Riley, MD  cycloSPORINE (RESTASIS) 0.05 % ophthalmic emulsion Place 1 drop into both eyes 2 (two) times daily as needed (dry eyes, irritation). 03/15/18   [provider]  diclofenac Sodium (VOLTAREN) 1 % GEL Apply 2-4 g topically 4 (four) times daily. Patient taking differently: Apply 2-4 g topically 4 (four) times daily as needed (arthritis pain). 09/15/19   Kathryne Hitch, MD  diltiazem (CARDIZEM CD) 240 MG 24 hr capsule TAKE 1 CAPSULE DAILY. PLEASE  CALL OFFICE TO SCHEDULE APPOINTMENT FOR FURTHER REFILLS Patient taking differently: Take 240 mg by mouth daily. 05/25/19   Georgeanna Lea, MD  docusate sodium (COLACE) 100 MG capsule Take 100 mg by mouth daily as needed for mild constipation. 01/20/11   [provider]  donepezil (ARICEPT) 10 MG tablet TAKE ONE TABLET BY MOUTH EVERYDAY AT BEDTIME Patient taking differently: Take 10 mg by mouth at bedtime. 07/13/22   Ihor Austin, NP  escitalopram (LEXAPRO) 10 MG tablet Take 10 mg by mouth daily.    [provider]  famotidine (PEPCID) 20 MG tablet Take 20 mg by mouth at bedtime as needed for heartburn or indigestion. 05/16/20   [provider]  hydroxyurea (HYDREA) 500 MG capsule TAKE ONE CAPSULE BY MOUTH AT NOON 05/12/22   Si Gaul, MD  LORazepam (ATIVAN) 1 MG tablet Take 0.5-1 tablets (0.5-1 mg total) by mouth 2 (two) times daily as needed for anxiety. 01/14/21   Osvaldo Shipper, MD  losartan (COZAAR) 25 MG tablet Take 1 tablet (25 mg total) by mouth daily. Please schedule appointment for further refills. 3rd and final attempt. 08/04/22   Georgeanna Lea, MD  memantine (NAMENDA) 10 MG tablet TAKE ONE TABLET BY MOUTH AT NOON and TAKE ONE TABLET BY MOUTH EVERYDAY AT BEDTIME Patient taking differently: Take 10 mg by mouth 2 (two) times daily. 01/08/22   Micki Riley, MD  methocarbamol (ROBAXIN) 500 MG tablet Take 1 tablet (500 mg total) by mouth every 8 (eight) hours as needed for muscle spasms. 05/30/19   Tyrell Antonio, MD  Olopatadine HCl 0.2 % SOLN Place 1 drop into both eyes in the morning and at bedtime. 07/10/18   [provider]  OVER THE COUNTER MEDICATION Place 1 spray into both nostrils daily as needed (congestion). Congestion nasal spray    [provider]  pantoprazole (PROTONIX) 40 MG tablet Take 1 tablet (40 mg total) by mouth daily. 09/02/20   Hilarie Fredrickson, MD  rosuvastatin (CRESTOR) 10 MG tablet Take 10 mg by mouth daily. 09/06/17    [provider]  vitamin B-12 (CYANOCOBALAMIN) 1000 MCG tablet Take 1,000 mcg by mouth daily.    [provider]      Allergies    Warfarin and related and Ace inhibitors    Review of Systems   Review of Systems  Physical Exam Updated Vital Signs BP 132/75 (BP Location: Right Arm)   Pulse 62   Temp 97.6 F (36.4 C) (Oral)   Resp 18   Ht 6\' 1"  (1.854 m)   Wt 73.5 kg   SpO2 96%   BMI 21.38 kg/m  Physical Exam Vitals and nursing note reviewed.  Constitutional:      General: He is not in acute distress.    Appearance: He is well-developed.     Comments: Responding to pain only  HENT:     Head: Normocephalic and atraumatic.     Right Ear: External ear normal.     Left Ear: External ear normal.     Nose: Nose normal.  Eyes:     Extraocular Movements: Extraocular movements intact.     Conjunctiva/sclera: Conjunctivae normal.     Pupils: Pupils are equal, round, and reactive to light.     Comments: Pupils 3 mm bilaterally  Cardiovascular:     Rate and Rhythm: Normal rate. Rhythm irregular.     Heart sounds: Normal heart sounds.  Pulmonary:     Effort: Pulmonary effort is normal. No respiratory distress.     Breath sounds: Normal breath sounds.  Musculoskeletal:     Cervical back: Normal range of motion and neck supple.     Right lower leg: No edema.     Left lower leg: No edema.  Skin:    General: Skin is warm and dry.  Neurological:     Comments: Opens eyes to pain.  Will track.  Nonverbal.  Withdraws all 4 extremities to pain.  Exam limited due to altered mental status.  Psychiatric:        Mood and Affect: Mood normal.        Behavior: Behavior normal.     ED Results / Procedures / Treatments   Labs (all labs ordered are listed, but only abnormal results are displayed) Labs Reviewed  PROTIME-INR - Abnormal; Notable for the following components:      Result Value   Prothrombin Time 15.6 (*)    All other components within normal limits  CBC  - Abnormal; Notable for the following components:   RDW 16.5 (*)    All other components within normal limits  COMPREHENSIVE METABOLIC PANEL - Abnormal; Notable for the following components:   Glucose, Bld 112 (*)    Creatinine, Ser 1.44 (*)    Total Protein 6.4 (*)    GFR, Estimated 48 (*)    All other components within normal limits  URINALYSIS, ROUTINE W  REFLEX MICROSCOPIC - Abnormal; Notable for the following components:   Specific Gravity, Urine >1.046 (*)    Ketones, ur 5 (*)    All other components within normal limits  CBG MONITORING, ED - Abnormal; Notable for the following components:   Glucose-Capillary 112 (*)    All other components within normal limits  I-STAT CHEM 8, ED - Abnormal; Notable for the following components:   Sodium 146 (*)    Potassium 3.4 (*)    Creatinine, Ser 1.60 (*)    Glucose, Bld 108 (*)    All other components within normal limits  ETHANOL  APTT  DIFFERENTIAL  RAPID URINE DRUG SCREEN, HOSP PERFORMED  LIPID PANEL  HEMOGLOBIN A1C  CBC  BASIC METABOLIC PANEL  TROPONIN I (HIGH SENSITIVITY)  TROPONIN I (HIGH SENSITIVITY)    EKG None  Radiology MR BRAIN WO CONTRAST  Result Date: 10/09/2022 CLINICAL DATA:  Neuro deficit, acute, stroke suspected. Ataxia and falls. EXAM: MRI HEAD WITHOUT CONTRAST TECHNIQUE: Multiplanar, multiecho pulse sequences of the brain and surrounding structures were obtained without intravenous contrast. COMPARISON:  Head CT 10/09/2022.  MRI brain 04/30/2021. FINDINGS: Brain: No acute infarct or hemorrhage. 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 Background of moderate chronic small-vessel disease. No hydrocephalus or extra-axial collection. No mass or midline shift. Vascular: Normal flow voids. Skull and upper cervical spine: Normal marrow signal. Sinuses/Orbits: No acute findings. Other: None. IMPRESSION: 1. No acute intracranial abnormality or mass. 2. Old infarct in the right  occipital lobe. Electronically Signed   By: Orvan Falconer M.D.   On: 10/09/2022 16:42   EEG adult  Result Date: 10/09/2022 Charlsie Quest, MD     10/09/2022  6:04 PM Patient Name: Dean Pratt MRN: [id number] Epilepsy Attending: Charlsie Quest Referring Physician/Provider: Elmer Picker, NP Date: 10/09/2022 Duration: 33.38 mins Patient history: 87yo m with aphasia getting eeg to evaluate for seizure Level of alertness: Awake, asleep AEDs during EEG study: Ativan Technical aspects: This EEG study was done with scalp electrodes positioned according to the 10-20 International system of electrode placement. Electrical activity was reviewed with band pass filter of 1-70Hz , sensitivity of 7 uV/mm, display speed of 1mm/sec with a 60Hz  notched filter applied as appropriate. EEG data were recorded continuously and digitally stored.  Video monitoring was available and reviewed as appropriate. Description: The posterior dominant rhythm consists of 8 Hz activity of moderate voltage (25-35 uV) seen predominantly in posterior head regions, symmetric and reactive to eye opening and eye closing. Sleep was characterized by vertex waves, sleep spindles (12 to 14 Hz), maximal frontocentral region. EEG showed intermittent 5 to 6 Hz theta slowing in right parieto-occipital region. Hyperventilation and photic stimulation were not performed.   ABNORMALITY - Intermittent slow, right parieto-occipital region IMPRESSION: This study is suggestive of cortical dysfunction arising from right parieto-occipital region likely secondary to underlying stroke. No seizures or epileptiform discharges were seen throughout the recording. Priyanka Annabelle Harman   CT ANGIO HEAD NECK W WO CM (CODE STROKE)  Result Date: 10/09/2022 CLINICAL DATA:  Code stroke EXAM: CT ANGIOGRAPHY HEAD AND NECK TECHNIQUE: Multidetector CT imaging of the head and neck was performed using the standard protocol during bolus administration of intravenous contrast.  Multiplanar CT image reconstructions and MIPs were obtained to evaluate the vascular anatomy. Carotid stenosis measurements (when applicable) are obtained utilizing NASCET criteria, using the distal internal carotid diameter as the denominator. RADIATION DOSE REDUCTION: This exam was performed according to  the departmental dose-optimization program which includes automated exposure control, adjustment of the mA and/or kV according to patient size and/or use of iterative reconstruction technique. CONTRAST:  75mL OMNIPAQUE IOHEXOL 350 MG/ML SOLN COMPARISON:  CT head 2 days prior, CTA head and neck 01/10/2021 FINDINGS: CT HEAD FINDINGS Brain: There is no acute intracranial hemorrhage, extra-axial fluid collection, or acute infarct Background parenchymal volume loss with prominence of the ventricular system and extra-axial CSF spaces is unchanged. A remote right occipital infarct is unchanged. Additional confluent hypodensity in the supratentorial white matter likely reflecting sequela of underlying chronic small-vessel ischemic change is stable. Gray-white differentiation is preserved The pituitary and suprasellar region are normal. There is no mass lesion. There is no mass effect or midline shift. Vascular: See below. Skull: Normal. Negative for fracture or focal lesion. Sinuses/Orbits: The paranasal sinuses are clear. Bilateral lens implants are in place. The globes and orbits are otherwise unremarkable. Other: The mastoid air cells and middle ear cavities are clear. Review of the MIP images confirms the above findings CTA NECK FINDINGS Aortic arch: There is calcified plaque in the imaged aortic arch. The origins of the major branch vessels are patent. The subclavian arteries are patent to the level imaged with mild stenosis proximally on the right. Right carotid system: The right common, internal, and external carotid arteries are patent, with mild plaque at the bifurcation but no significant stenosis or occlusion.  There is no evidence of dissection or aneurysm. Left carotid system: The left common, internal, and external carotid arteries are patent, with mild plaque at the bifurcation but no significant stenosis or occlusion there is no evidence of dissection or aneurysm. Vertebral arteries: The vertebral arteries are patent, without significant stenosis or occlusion there is no evidence of dissection or aneurysm. Skeleton: There is no acute osseous abnormality or suspicious osseous lesion. Advanced multilevel degenerative change throughout the cervical spine with mild anterolisthesis of C4 on C5 and mild compression of the C5 vertebral body are unchanged. There is no visible canal hematoma. Other neck: The soft tissues of the neck are unremarkable. Upper chest: There is emphysema in the lung apices. Review of the MIP images confirms the above findings CTA HEAD FINDINGS Anterior circulation: There is calcified plaque in the intracranial ICAs without significant stenosis or occlusion. The bilateral MCAs and ACAS are patent, without proximal stenosis or occlusion. The anterior communicating artery is normal There is no aneurysm or AVM. Posterior circulation: There is calcified plaque in the bilateral V4 segments without significant stenosis or occlusion. The basilar artery is patent. The right PICA origin is not definitely seen. The other major cerebellar arteries appear patent. The bilateral PCAs are patent, without proximal stenosis or occlusion. Posterior communicating arteries are not definitely seen. There is no aneurysm or AVM. Dissection. Venous sinuses: Not well evaluated due to bolus timing. Anatomic variants: None. Review of the MIP images confirms the above findings IMPRESSION: 1. No acute intracranial pathology on initial noncontrast head CT. 2. Overall mild atherosclerotic plaque in the vasculature of the head and neck with no hemodynamically significant stenosis, occlusion, or Findings communicated to Dr Otelia Limes  at 1:39 pm. Electronically Signed   By: Lesia Hausen M.D.   On: 10/09/2022 13:47   CT HEAD CODE STROKE WO CONTRAST  Result Date: 10/09/2022 CLINICAL DATA:  Code stroke EXAM: CT ANGIOGRAPHY HEAD AND NECK TECHNIQUE: Multidetector CT imaging of the head and neck was performed using the standard protocol during bolus administration of intravenous contrast. Multiplanar CT image reconstructions and  MIPs were obtained to evaluate the vascular anatomy. Carotid stenosis measurements (when applicable) are obtained utilizing NASCET criteria, using the distal internal carotid diameter as the denominator. RADIATION DOSE REDUCTION: This exam was performed according to the departmental dose-optimization program which includes automated exposure control, adjustment of the mA and/or kV according to patient size and/or use of iterative reconstruction technique. CONTRAST:  75mL OMNIPAQUE IOHEXOL 350 MG/ML SOLN COMPARISON:  CT head 2 days prior, CTA head and neck 01/10/2021 FINDINGS: CT HEAD FINDINGS Brain: There is no acute intracranial hemorrhage, extra-axial fluid collection, or acute infarct Background parenchymal volume loss with prominence of the ventricular system and extra-axial CSF spaces is unchanged. A remote right occipital infarct is unchanged. Additional confluent hypodensity in the supratentorial white matter likely reflecting sequela of underlying chronic small-vessel ischemic change is stable. Gray-white differentiation is preserved The pituitary and suprasellar region are normal. There is no mass lesion. There is no mass effect or midline shift. Vascular: See below. Skull: Normal. Negative for fracture or focal lesion. Sinuses/Orbits: The paranasal sinuses are clear. Bilateral lens implants are in place. The globes and orbits are otherwise unremarkable. Other: The mastoid air cells and middle ear cavities are clear. Review of the MIP images confirms the above findings CTA NECK FINDINGS Aortic arch: There is  calcified plaque in the imaged aortic arch. The origins of the major branch vessels are patent. The subclavian arteries are patent to the level imaged with mild stenosis proximally on the right. Right carotid system: The right common, internal, and external carotid arteries are patent, with mild plaque at the bifurcation but no significant stenosis or occlusion. There is no evidence of dissection or aneurysm. Left carotid system: The left common, internal, and external carotid arteries are patent, with mild plaque at the bifurcation but no significant stenosis or occlusion there is no evidence of dissection or aneurysm. Vertebral arteries: The vertebral arteries are patent, without significant stenosis or occlusion there is no evidence of dissection or aneurysm. Skeleton: There is no acute osseous abnormality or suspicious osseous lesion. Advanced multilevel degenerative change throughout the cervical spine with mild anterolisthesis of C4 on C5 and mild compression of the C5 vertebral body are unchanged. There is no visible canal hematoma. Other neck: The soft tissues of the neck are unremarkable. Upper chest: There is emphysema in the lung apices. Review of the MIP images confirms the above findings CTA HEAD FINDINGS Anterior circulation: There is calcified plaque in the intracranial ICAs without significant stenosis or occlusion. The bilateral MCAs and ACAS are patent, without proximal stenosis or occlusion. The anterior communicating artery is normal There is no aneurysm or AVM. Posterior circulation: There is calcified plaque in the bilateral V4 segments without significant stenosis or occlusion. The basilar artery is patent. The right PICA origin is not definitely seen. The other major cerebellar arteries appear patent. The bilateral PCAs are patent, without proximal stenosis or occlusion. Posterior communicating arteries are not definitely seen. There is no aneurysm or AVM. Dissection. Venous sinuses: Not well  evaluated due to bolus timing. Anatomic variants: None. Review of the MIP images confirms the above findings IMPRESSION: 1. No acute intracranial pathology on initial noncontrast head CT. 2. Overall mild atherosclerotic plaque in the vasculature of the head and neck with no hemodynamically significant stenosis, occlusion, or Findings communicated to Dr Otelia Limes at 1:39 pm. Electronically Signed   By: Lesia Hausen M.D.   On: 10/09/2022 13:47   CT ABDOMEN PELVIS W CONTRAST  Result Date: 10/07/2022 CLINICAL DATA:  Acute nonlocalized abdominal pain EXAM: CT ABDOMEN AND PELVIS WITH CONTRAST TECHNIQUE: Multidetector CT imaging of the abdomen and pelvis was performed using the standard protocol following bolus administration of intravenous contrast. RADIATION DOSE REDUCTION: This exam was performed according to the departmental dose-optimization program which includes automated exposure control, adjustment of the mA and/or kV according to patient size and/or use of iterative reconstruction technique. CONTRAST:  OMNIPAQUE IOHEXOL 300 MG/ML  SOLN COMPARISON:  02/28/2008 FINDINGS: Lower chest: Lung bases are clear. Hepatobiliary: Cystic change in the gallbladder consistent with adenomyosis. Subcentimeter low-attenuation lesions in the liver are unchanged since prior study, likely cysts. No cholelithiasis or bile duct dilatation. Pancreas: Unremarkable. No pancreatic ductal dilatation or surrounding inflammatory changes. Spleen: Normal in size without focal abnormality. Adrenals/Urinary Tract: No adrenal gland nodules. Multiple bilateral renal cysts. Largest on the right measures 3.4 cm diameter. There is mild progression since the prior study but the appearance is that of simple cysts. This is likely benign and no imaging follow-up is indicated. Nephrograms are symmetrical. No hydronephrosis or hydroureter. Bladder is normal. Stomach/Bowel: Stomach, small bowel, and colon are not abnormally distended. No wall  thickening or inflammatory changes are appreciated. Previous cecal mass or thickening have resolved or been resected in the interval. Appendix is surgically absent. Vascular/Lymphatic: Aortic atherosclerosis. No enlarged abdominal or pelvic lymph nodes. Reproductive: Prostate gland is mildly enlarged. Other: No free air or free fluid in the abdomen. Small periumbilical hernia containing fat. Musculoskeletal: Degenerative changes in the spine. Lumbar scoliosis convex towards the left. No acute bony abnormalities. IMPRESSION: 1. No acute process demonstrated in the abdomen or pelvis. No evidence of bowel obstruction or inflammation. 2. Adenomyosis of the gallbladder. 3. Aortic atherosclerosis. 4. Prostate gland is enlarged. Electronically Signed   By: Burman Nieves M.D.   On: 10/07/2022 23:16   CT Head Wo Contrast  Result Date: 10/07/2022 CLINICAL DATA:  Altered mental status EXAM: CT HEAD WITHOUT CONTRAST TECHNIQUE: Contiguous axial images were obtained from the base of the skull through the vertex without intravenous contrast. RADIATION DOSE REDUCTION: This exam was performed according to the departmental dose-optimization program which includes automated exposure control, adjustment of the mA and/or kV according to patient size and/or use of iterative reconstruction technique. COMPARISON:  09/28/2022 FINDINGS: Brain: No evidence of acute infarction, hemorrhage, hydrocephalus, extra-axial collection or mass lesion/mass effect. Old right occipital infarct. There is periventricular hypoattenuation compatible with chronic microvascular disease. Vascular: Calcific atherosclerosis at the skull base. Skull: Negative Sinuses/Orbits: Paranasal sinuses are clear. No mastoid effusion. Normal orbits. Other: None IMPRESSION: 1. No acute intracranial abnormality. 2. Old right occipital infarct and findings of chronic microvascular disease. Electronically Signed   By: Deatra Robinson M.D.   On: 10/07/2022 23:09   DG Chest  Port 1 View  Result Date: 10/07/2022 CLINICAL DATA:  Altered mental status EXAM: PORTABLE CHEST 1 VIEW COMPARISON:  04/13/2022 FINDINGS: Cardiac shadow is within normal limits. Aortic calcifications are noted. Lungs are well aerated bilaterally. No bony abnormality is noted. IMPRESSION: No active disease. Electronically Signed   By: Alcide Clever M.D.   On: 10/07/2022 22:13    Procedures Procedures    Medications Ordered in ED Medications   stroke: early stages of recovery book (has no administration in time range)  0.9 %  sodium chloride infusion ( Intravenous New Bag/Given 10/09/22 1817)  acetaminophen (TYLENOL) tablet 650 mg (has no administration in time range)    Or  acetaminophen (TYLENOL) 160 MG/5ML solution 650 mg (has no administration  in time range)    Or  acetaminophen (TYLENOL) suppository 650 mg (has no administration in time range)  senna-docusate (Senokot-S) tablet 1 tablet (has no administration in time range)  enoxaparin (LOVENOX) injection 40 mg (has no administration in time range)  ondansetron (ZOFRAN) injection 4 mg (has no administration in time range)  aspirin EC tablet 81 mg (has no administration in time range)  donepezil (ARICEPT) tablet 10 mg (has no administration in time range)  hydroxyurea (HYDREA) capsule 500 mg (has no administration in time range)  memantine (NAMENDA) tablet 10 mg (has no administration in time range)  rosuvastatin (CRESTOR) tablet 10 mg (has no administration in time range)  naloxone [facility]) injection 0.4 mg (0.4 mg Intravenous Given 10/09/22 1246)  iohexol (OMNIPAQUE) 350 MG/ML injection 75 mL (75 mLs Intravenous Contrast Given 10/09/22 1330)  LORazepam (ATIVAN) injection 0.5 mg (0.5 mg Intravenous Given 10/09/22 1417)    ED Course/ Medical Decision Making/ A&P Clinical Course as of 10/09/22 2052  Fri Oct 09, 2022  1300 Wife confirmed full code [RP]  1310 Dr Otelia Limes from neurology consulted [RP]  1422 Dr Allena Katz from hospitalist will  admit the patient [RP]    Clinical Course User Index [RP] Rondel Baton, MD                                 Medical Decision Making Amount and/or Complexity of Data Reviewed Labs: ordered. Radiology: ordered.  Risk Prescription drug management. Decision regarding hospitalization.   WHALEN TROMPETER is a 87 y.o. male with comorbidities that complicate the patient evaluation including right ACA infarct, atrial fibrillation not on anticoagulation, ICH, and vascular dementia who presents emergency department with altered mental status.    Initial Ddx:  Stroke, ICH, encephalopathy, UTI, syncope, MI, electrolyte abnormality  MDM/Course:  Patient presents to the emergency department after loss of consciousness that his wife witnessed.  Very drowsy on arrival.  Initially seemed to be withdrawing all extremities to pain except for his right upper extremity.  On repeat exam was spontaneously moving his right upper extremity but remained drowsy.  Did discuss with neurology since I was concerned that he possibly could be aphasic from a stroke though with his depressed mental status initially felt that either encephalopathy or ICH were more likely.  They recommended code stroke activation which was done.  Patient was taken to the scanner.  No ICH.  No LVO.  EKG and troponins did not reveal cause of the patient's loss of consciousness.  Neurology talk to the patient's wife and appears that he has had some stiffening episodes where he will lose consciousness afterwards.  Concerned that he may potentially be having a seizure specially since he has structural brain abnormalities from his prior strokes.  Patient was admitted to hospitalist and MRI was ordered.  Neurology will also discuss EEG as well.  This patient presents to the ED for concern of complaints listed in HPI, this involves an extensive number of treatment options, and is a complaint that carries with it a high risk of complications and  morbidity. Disposition including potential need for admission considered.   Dispo: Admit to Floor  Additional history obtained from spouse Records reviewed Outpatient Clinic Notes The following labs were independently interpreted: Chemistry and show CKD I independently reviewed the following imaging with scope of interpretation limited to determining acute life threatening conditions related to emergency care: CT Head and agree with the  radiologist interpretation with the following exceptions: none I personally reviewed and interpreted cardiac monitoring: normal sinus rhythm  I personally reviewed and interpreted the pt's EKG: see above for interpretation  I have reviewed the patients home medications and made adjustments as needed Consults: Hospitalist and Neurology Social Determinants of health:  Elderly  Portions of this note were generated with Scientist, clinical (histocompatibility and immunogenetics). Dictation errors may occur despite best attempts at proofreading.           Final Clinical Impression(s) / ED Diagnoses Final diagnoses:  Somnolence  Toxic metabolic encephalopathy  Loss of consciousness [facility])    Rx / DC Orders ED Discharge Orders     None         Rondel Baton, MD 10/09/22 2053

## 2022-10-09 NOTE — Hospital Course (Signed)
Dean Pratt is a 87 y.o. 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  Neurology consulting.

## 2022-10-09 NOTE — ED Notes (Signed)
Patient transported to MRI 

## 2022-10-09 NOTE — Procedures (Signed)
Patient Name: MALIKHI OGAN  MRN: [id number]  Epilepsy Attending: Charlsie Quest  Referring Physician/Provider: Elmer Picker, NP  Date: 10/09/2022 Duration: 33.38 mins  Patient history: 87yo m with aphasia getting eeg to evaluate for seizure  Level of alertness: Awake, asleep  AEDs during EEG study: Ativan  Technical aspects: This EEG study was done with scalp electrodes positioned according to the 10-20 International system of electrode placement. Electrical activity was reviewed with band pass filter of 1-70Hz , sensitivity of 7 uV/mm, display speed of 54mm/sec with a 60Hz  notched filter applied as appropriate. EEG data were recorded continuously and digitally stored.  Video monitoring was available and reviewed as appropriate.  Description: The posterior dominant rhythm consists of 8 Hz activity of moderate voltage (25-35 uV) seen predominantly in posterior head regions, symmetric and reactive to eye opening and eye closing. Sleep was characterized by vertex waves, sleep spindles (12 to 14 Hz), maximal frontocentral region. EEG showed intermittent 5 to 6 Hz theta slowing in right parieto-occipital region. Hyperventilation and photic stimulation were not performed.     ABNORMALITY - Intermittent slow, right parieto-occipital region  IMPRESSION: This study is suggestive of cortical dysfunction arising from right parieto-occipital region likely secondary to underlying stroke. No seizures or epileptiform discharges were seen throughout the recording.  Goldie Dimmer Annabelle Harman

## 2022-10-09 NOTE — Progress Notes (Signed)
Mb called, Pt is now in room and available

## 2022-10-09 NOTE — ED Triage Notes (Addendum)
Pt arrives via EMS from [facility] independent [facility]. Pt in bathroom brushing teeth, wife found pt with face on sink, still standing. EMS states pt with hx periods of unresponsiveness, but they dont last long. Pt also having tremors and jerking which is baseline for pt per EMS.  Pt with hx afib. 92% on RA- improved on 3L [place] for EMS 20 L AC

## 2022-10-09 NOTE — ED Notes (Signed)
The pt is alert and oriented family sitting at his bedside

## 2022-10-09 NOTE — Code Documentation (Signed)
Stroke Response Nurse Documentation Code Documentation  Dean Pratt is a 87 y.o. male arriving to [facility]  via [facility] on 10/09/2022 with past medical hx of congestive heart failure, GERD, hypertension, hyperlipidemia, atrial fibrillation,intracranial hemorrhage 04/2010, migraines and dementia. On aspirin 81 mg daily. Code stroke was activated by ED.   Patient from [facility] independent living where he was LKW at 1030 and was found by wife in bathroom slumped over. Wife reports he has tremors baseline and was discharged from hospital yesterday for altered mental status and abdominal pain.   Stroke team in ED room. Labs drawn and patient cleared by EDP for CT prior to team's arrival. Patient to CT with team. NIHSS 20, see documentation for details and code stroke times. Patient with decreased LOC, disoriented, not following commands, left hemianopia, bilateral arm weakness, bilateral leg weakness, bilateral decreased sensation, Global aphasia , and dysarthria  on exam. The following imaging was completed:  CT Head and CTA. Patient is not a candidate for IV Thrombolytic due to stroke not suspected and history of ICH. Patient is not a candidate for IR due to no LVO.   Care Plan: Q2 NIHSS and VS, MRI.   Bedside handoff with ED RN.    Ferman Hamming Stroke Response RN

## 2022-10-09 NOTE — ED Notes (Signed)
Back in room from CT. Daughter and son in law at bedside. Patient now speaking few words and following small commands.

## 2022-10-09 NOTE — Progress Notes (Addendum)
TRH night cross cover note:   I was contacted by RN with request for prn ativan for this patient who is becoming more agitated, with associated behaviors refractory to verbal attempts at redirection.   While the patient is 87 y.o., it is noted that he received a dose of IV Ativan earlier today, without ensuing evidence of paradoxical agitation.  I also noted that his EEG is now complete.  I subsequently placed order for Ativan 0.5 mg IV x 1 dose prn for agitation, with plan to monitor for e/o ensuing paradoxical exacerbation.     Newton Pigg, DO Hospitalist

## 2022-10-10 DIAGNOSIS — G9340 Encephalopathy, unspecified: Secondary | ICD-10-CM | POA: Diagnosis not present

## 2022-10-10 LAB — CBC
HCT: 49.6 % (ref 39.0–52.0)
Hemoglobin: 15.6 g/dL (ref 13.0–17.0)
MCH: 27.8 pg (ref 26.0–34.0)
MCHC: 31.5 g/dL (ref 30.0–36.0)
MCV: 88.4 fL (ref 80.0–100.0)
Platelets: 321 10*3/uL (ref 150–400)
RBC: 5.61 MIL/uL (ref 4.22–5.81)
RDW: 15.8 % — ABNORMAL HIGH (ref 11.5–15.5)
WBC: 7.7 10*3/uL (ref 4.0–10.5)
nRBC: 0 % (ref 0.0–0.2)

## 2022-10-10 LAB — BASIC METABOLIC PANEL
Anion gap: 9 (ref 5–15)
BUN: 9 mg/dL (ref 8–23)
CO2: 22 mmol/L (ref 22–32)
Calcium: 8.8 mg/dL — ABNORMAL LOW (ref 8.9–10.3)
Chloride: 112 mmol/L — ABNORMAL HIGH (ref 98–111)
Creatinine, Ser: 1.2 mg/dL (ref 0.61–1.24)
GFR, Estimated: 59 mL/min — ABNORMAL LOW (ref >60)
Glucose, Bld: 91 mg/dL (ref 70–99)
Potassium: 3.4 mmol/L — ABNORMAL LOW (ref 3.5–5.1)
Sodium: 143 mmol/L (ref 135–145)

## 2022-10-10 LAB — HEMOGLOBIN A1C
Hgb A1c MFr Bld: 5.4 % (ref 4.8–5.6)
Mean Plasma Glucose: 108.28 mg/dL

## 2022-10-10 LAB — LIPID PANEL
Cholesterol: 80 mg/dL (ref 0–200)
HDL: 35 mg/dL — ABNORMAL LOW (ref >40)
LDL Cholesterol: 23 mg/dL (ref 0–99)
Total CHOL/HDL Ratio: 2.3 {ratio}
Triglycerides: 110 mg/dL (ref <150)
VLDL: 22 mg/dL (ref 0–40)

## 2022-10-10 MED ORDER — LORAZEPAM 0.5 MG PO TABS
0.5000 mg | ORAL_TABLET | Freq: Two times a day (BID) | ORAL | Status: DC | PRN
  Administered 2022-10-10 – 2022-10-12 (×3): 0.5 mg via ORAL
  Administered 2022-10-12: 1 mg via ORAL
  Filled 2022-10-10 (×3): qty 1
  Filled 2022-10-10: qty 2

## 2022-10-10 MED ORDER — DOCUSATE SODIUM 100 MG PO CAPS
200.0000 mg | ORAL_CAPSULE | Freq: Two times a day (BID) | ORAL | Status: DC
  Administered 2022-10-10 – 2022-10-13 (×5): 200 mg via ORAL
  Filled 2022-10-10 (×6): qty 2

## 2022-10-10 MED ORDER — CYCLOSPORINE 0.05 % OP EMUL: 1.0000 [drp] | Freq: Two times a day (BID) | OPHTHALMIC | Status: DC | PRN

## 2022-10-10 MED ORDER — POLYETHYLENE GLYCOL 3350 17 G PO PACK
17.0000 g | PACK | Freq: Two times a day (BID) | ORAL | Status: DC
  Administered 2022-10-11 – 2022-10-13 (×3): 17 g via ORAL
  Filled 2022-10-10 (×5): qty 1

## 2022-10-10 MED ORDER — PANTOPRAZOLE SODIUM 40 MG PO TBEC
40.0000 mg | DELAYED_RELEASE_TABLET | Freq: Every day | ORAL | Status: DC
  Administered 2022-10-10 – 2022-10-13 (×4): 40 mg via ORAL
  Filled 2022-10-10 (×4): qty 1

## 2022-10-10 MED ORDER — DILTIAZEM HCL ER COATED BEADS 120 MG PO CP24
240.0000 mg | ORAL_CAPSULE | Freq: Every day | ORAL | Status: DC
  Administered 2022-10-10 – 2022-10-11 (×2): 240 mg via ORAL
  Filled 2022-10-10 (×2): qty 2

## 2022-10-10 MED ORDER — VITAMIN B-12 1000 MCG PO TABS
1000.0000 ug | ORAL_TABLET | Freq: Every day | ORAL | Status: DC
  Administered 2022-10-10 – 2022-10-13 (×4): 1000 ug via ORAL
  Filled 2022-10-10 (×4): qty 1

## 2022-10-10 MED ORDER — BISACODYL 10 MG RE SUPP
10.0000 mg | Freq: Once | RECTAL | Status: AC
  Administered 2022-10-10: 10 mg via RECTAL
  Filled 2022-10-10: qty 1

## 2022-10-10 MED ORDER — METHOCARBAMOL 500 MG PO TABS: 500.0000 mg | ORAL_TABLET | Freq: Three times a day (TID) | ORAL | Status: DC | PRN

## 2022-10-10 MED ORDER — VITAMIN C 500 MG PO TABS
1000.0000 mg | ORAL_TABLET | Freq: Every day | ORAL | Status: DC
  Administered 2022-10-10 – 2022-10-13 (×4): 1000 mg via ORAL
  Filled 2022-10-10 (×4): qty 2

## 2022-10-10 MED ORDER — OLOPATADINE HCL 0.1 % OP SOLN
1.0000 [drp] | Freq: Two times a day (BID) | OPHTHALMIC | Status: DC
  Administered 2022-10-10 – 2022-10-13 (×7): 1 [drp] via OPHTHALMIC
  Filled 2022-10-10: qty 5

## 2022-10-10 MED ORDER — SODIUM CHLORIDE 0.9 % IV SOLN: INTRAVENOUS | Status: AC

## 2022-10-10 MED ORDER — SODIUM CHLORIDE 0.9 % IV SOLN
1.0000 g | INTRAVENOUS | Status: DC
  Administered 2022-10-10 – 2022-10-11 (×2): 1 g via INTRAVENOUS
  Filled 2022-10-10 (×3): qty 10

## 2022-10-10 MED ORDER — ALLOPURINOL 100 MG PO TABS
100.0000 mg | ORAL_TABLET | Freq: Every day | ORAL | Status: DC
  Administered 2022-10-10 – 2022-10-12 (×3): 100 mg via ORAL
  Filled 2022-10-10 (×3): qty 1

## 2022-10-10 MED ORDER — POTASSIUM CHLORIDE 10 MEQ/100ML IV SOLN
10.0000 meq | INTRAVENOUS | Status: AC
  Administered 2022-10-10 (×2): 10 meq via INTRAVENOUS
  Filled 2022-10-10: qty 100

## 2022-10-10 NOTE — Evaluation (Addendum)
Physical Therapy Evaluation Patient Details Name: Dean Pratt MRN: [id number] DOB: August 06, 1935 Today's Date: 10/10/2022  History of Present Illness  Dean Pratt, is a 87 y.o. male who presented to the ED for evaluation of altered mental status, unresponsive episode. 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  Pt currently limited functionally due to the problems listed below.  (see problems list.)  Pt will benefit from PT to maximize function and safety to be able to get home safely with available assist . .  .        If plan is discharge home, recommend the following: A little help with walking and/or transfers;A little help with bathing/dressing/bathroom;Assistance with cooking/housework;Direct supervision/assist for medications management;Direct supervision/assist for financial management;Assist for transportation;Help with stairs or ramp for entrance;Supervision due to cognitive status   Can travel by private vehicle        Equipment Recommendations None recommended by PT  Recommendations for Other Services       Functional Status Assessment Patient has had a recent decline in their functional status and demonstrates the ability to make significant improvements in function in a reasonable and predictable amount of time.     Precautions / Restrictions Precautions Precautions: Fall Restrictions Weight Bearing Restrictions: No      Mobility  Bed Mobility Overal bed mobility: Needs Assistance Bed Mobility: Supine to Sit     Supine to sit: Min assist          Transfers Overall transfer level: Needs assistance Equipment used: 1 person hand held assist Transfers:  Sit to/from Stand Sit to Stand: Min assist                Ambulation/Gait Ambulation/Gait assistance: Min assist Gait Distance (Feet): 380 Feet Assistive device: IV Pole Gait Pattern/deviations: Step-through pattern   Gait velocity interpretation: 1.31 - 2.62 ft/sec, indicative of limited community ambulator   General Gait Details: Episodes of mild instability mixed with mod unsteadiness and periods where pt loses all focus on task.  VSS, needs min assist overall.  Stairs            Wheelchair Mobility     Tilt Bed    Modified Rankin (Stroke Patients Only)       Balance Overall balance assessment: Needs assistance Sitting-balance support: Feet supported Sitting balance-Leahy Scale: Good     Standing balance support: Single extremity supported, During functional activity Standing balance-Leahy Scale: Fair                               Pertinent Vitals/Pain Pain Assessment Pain Assessment: Faces Faces Pain Scale: Hurts a little bit Pain Location: general LE with movement    Home Living Family/patient expects to be discharged to:: Private residence Living Arrangements: Spouse/significant other Available Help at Discharge: Family;Available 24 hours/day Type of Home: Independent living facility [facility]) Home Access: Level entry       Home Layout: One level Home Equipment: Agricultural consultant (2 wheels);Rollator (4 wheels);Cane - single point;Shower seat;Grab bars - tub/shower;Grab bars - toilet Additional Comments: Currently at [facility] ILF; per pt he and his wife have plans to transition to ALF  Prior Function Prior Level of Function : Independent/Modified Independent;Needs assist  Cognitive Assist : ADLs (cognitive);Mobility (cognitive)           Mobility Comments: SPC, reports 1-2 falls this year. Wife reminds him to use [facility] ADLs Comments: mod I physicall, wife assists cognitively as needed. does not drive.      Extremity/Trunk Assessment             Cervical / Trunk Assessment Cervical / Trunk Assessment: Normal  Communication   Communication Communication: No apparent difficulties  Cognition Arousal: Alert Behavior During Therapy: WFL for tasks assessed/performed Overall Cognitive Status: History of cognitive impairments - at baseline                                 General Comments: oriented to self, place and situation. followed all simple 1 step commands. moments of confusion/forgetfulness noted, benefits from cues for attention and problem solving        General Comments General comments (skin integrity, edema, etc.): vss on RA    Exercises     Assessment/Plan    PT Assessment Patient needs continued PT services  PT Problem List Decreased strength;Decreased activity tolerance;Decreased mobility;Decreased balance;Decreased cognition       PT Treatment Interventions DME instruction;Gait training;Stair training;Functional mobility training;Patient/family education;Therapeutic activities    PT Goals (Current goals can be found in the Care Plan section)  Acute Rehab PT Goals PT Goal Formulation: Patient unable to participate in goal setting Time For Goal Achievement: 10/24/22 Potential to Achieve Goals: Fair    Frequency Min 1X/week     Co-evaluation               AM-PAC PT "6 Clicks" Mobility  Outcome Measure Help needed turning from your back to your side while in a flat bed without using bedrails?: A Little Help needed moving from lying on your back to sitting on the side of a flat bed without using bedrails?: A Little Help needed moving to and from a bed to a chair (including a wheelchair)?: A Little Help needed standing up from a chair using your arms (e.g., wheelchair or bedside chair)?: A Little Help needed to walk in hospital room?: A Little Help needed climbing 3-5 steps with a railing? : A Little 6 Click Score: 18    End of  Session   Activity Tolerance: Patient tolerated treatment well Patient left: in chair;with call bell/phone within reach;with chair alarm set Nurse Communication: Mobility status PT Visit Diagnosis: Unsteadiness on feet (R26.81);Other abnormalities of gait and mobility (R26.89)    Time: 6045-4098 PT Time Calculation (min) (ACUTE ONLY): 40 min   Charges:   PT Evaluation $PT Eval Moderate Complexity: 1 Mod PT Treatments $Gait Training: 8-22 mins $Therapeutic Activity: 8-22 mins PT General Charges $$ ACUTE PT VISIT: 1 Visit         10/10/2022  Jacinto Halim., PT Acute Rehabilitation Services [phone]  (office)  Eliseo Gum Nyheem Binette 10/10/2022, 3:40 PM

## 2022-10-10 NOTE — Evaluation (Signed)
Occupational Therapy Evaluation Patient Details Name: Dean Pratt MRN: [id number] DOB: 1935-12-25 Today's Date: 10/10/2022   History of Present Illness Dean Pratt, is a 87 y.o. male who presented to the ED for evaluation of altered mental status, unresponsive episode. 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 He is mod I and lives with his wife who assists with cognitive tasks at baseline. Upon evaluation the pt was limited by baseline cognition, weakness, unsteady gait and limited activity tolerance. Overall he needed min A for bed mobility and simple transfers with HHA. Due to the deficits listed below the pt also needs up to minA for LB tasks and set up A for UB tasks. Cues needed throughout for all functional tasks. Pt will benefit from continued acute OT services and HHOT for continued therapy in his home environment.        If plan is discharge home, recommend the following: Supervision due to cognitive status;Assistance with cooking/housework;Direct supervision/assist for medications management;Direct supervision/assist for financial management;Assist for transportation    Functional Status Assessment  Patient has had a recent decline in their functional status and demonstrates the ability to make significant improvements in function in a reasonable and predictable amount of time.  Equipment Recommendations  None recommended by OT       Precautions / Restrictions Precautions Precautions: Fall Restrictions Weight Bearing Restrictions: No      Mobility Bed Mobility Overal bed mobility: Needs Assistance Bed Mobility: Supine to Sit     Supine to sit: Min assist          Transfers Overall transfer level: Needs assistance Equipment used: 1 person hand held assist Transfers:  Sit to/from Stand Sit to Stand: Min assist                  Balance Overall balance assessment: Needs assistance Sitting-balance support: Feet supported Sitting balance-Leahy Scale: Good     Standing balance support: Single extremity supported, During functional activity Standing balance-Leahy Scale: Fair                             ADL either performed or assessed with clinical judgement   ADL Overall ADL's : Needs assistance/impaired Eating/Feeding: Set up;Sitting   Grooming: Set up;Contact guard assist;Standing   Upper Body Bathing: Set up;Sitting   Lower Body Bathing: Minimal assistance;Sit to/from stand   Upper Body Dressing : Set up;Sitting   Lower Body Dressing: Minimal assistance;Sit to/from stand   Toilet Transfer: Contact guard assist;Ambulation   Toileting- Clothing Manipulation and Hygiene: Supervision/safety;Sitting/lateral lean       Functional mobility during ADLs: Minimal assistance General ADL Comments: min A for steadying balance and cues     Vision Baseline Vision/History: 1 Wears glasses Vision Assessment?: No apparent visual deficits     Perception Perception: Not tested       Praxis Praxis: Not tested       Pertinent Vitals/Pain Pain Assessment Pain Assessment: No/denies pain     Extremity/Trunk Assessment Upper Extremity Assessment Upper Extremity Assessment: Generalized weakness   Lower Extremity Assessment Lower Extremity Assessment: Defer to PT evaluation   Cervical / Trunk Assessment Cervical / Trunk Assessment: Normal   Communication Communication Communication: No apparent difficulties   Cognition Arousal: Alert Behavior During Therapy: Dean Pratt  for tasks assessed/performed Overall Cognitive Status: History of cognitive impairments - at baseline                                 General Comments: oriented to self, place and situation. followed all simple 1 step commands. moments of  confusion/forgetfulness noted, benefits from cues for attention and problem solving     General Comments  VSS on RA            Home Living Family/patient expects to be discharged to:: Private residence Living Arrangements: Spouse/significant other Available Help at Discharge: Family;Available 24 hours/day Type of Home: Independent living facility [facility]) Home Access: Level entry     Home Layout: One level     Bathroom Shower/Tub: Producer, television/film/video: Handicapped height     Home Equipment: Agricultural consultant (2 wheels);Rollator (4 wheels);Cane - single point;Shower seat;Grab bars - tub/shower;Grab bars - toilet   Additional Comments: Currently at [facility] ILF; per pt he and his wife have plans to transition to ALF      Prior Functioning/Environment Prior Level of Function : Independent/Modified Independent;Needs assist  Cognitive Assist : ADLs (cognitive);Mobility (cognitive)           Mobility Comments: SPC, reports 1-2 falls this year. Wife reminds him to use [facility] ADLs Comments: mod I physicall, wife assists cognitively as needed. does not drive.        OT Problem List: Decreased range of motion;Decreased activity tolerance;Impaired balance (sitting and/or standing);Decreased cognition;Decreased safety awareness;Decreased knowledge of use of DME or AE      OT Treatment/Interventions: Self-care/ADL training;DME and/or AE instruction;Therapeutic activities;Patient/family education;Balance training    OT Goals(Current goals can be found in the care plan section) Acute Rehab OT Goals Patient Stated Goal: to see wife OT Goal Formulation: With patient Time For Goal Achievement: 10/24/22 Potential to Achieve Goals: Good ADL Goals Pt Will Perform Grooming: with modified independence;standing Pt Will Perform Lower Body Dressing: with modified independence;sit to/from stand Pt Will Transfer to Toilet: with modified independence;ambulating Pt  Will Perform Toileting - Clothing Manipulation and hygiene: with modified independence Additional ADL Goal #1: Pt will navigate hospital environment with mod I and SPC to demonstrated reduced risk for falls at discharge  OT Frequency: Min 1X/week       AM-PAC OT "6 Clicks" Daily Activity     Outcome Measure Help from another person eating meals?: A Little Help from another person taking care of personal grooming?: A Little Help from another person toileting, which includes using toliet, bedpan, or urinal?: A Little Help from another person bathing (including washing, rinsing, drying)?: A Little Help from another person to put on and taking off regular upper body clothing?: A Little Help from another person to put on and taking off regular lower body clothing?: A Little 6 Click Score: 18   End of Session Equipment Utilized During Treatment: Gait belt Nurse Communication: Mobility status  Activity Tolerance: Patient tolerated treatment well Patient left: in chair;with call bell/phone within reach;with chair alarm set  OT Visit Diagnosis: Unsteadiness on feet (R26.81);Other abnormalities of gait and mobility (R26.89);Muscle weakness (generalized) (M62.81)                Time: 1610-9604 OT Time Calculation (min): 15 min Charges:  OT General Charges $OT Visit: 1 Visit OT Evaluation $OT Eval Moderate Complexity: 1 Mod  Derenda Mis, OTR/L Acute Rehabilitation Services Office [phone] Secure Chat  Communication Preferred,   Donia Pounds 10/10/2022, 8:44 AM

## 2022-10-10 NOTE — Care Management Obs Status (Signed)
MEDICARE OBSERVATION STATUS NOTIFICATION   Patient Details  Name: Dean Pratt MRN: [id number] Date of Birth: 01-12-1935   Medicare Observation Status Notification Given:  Yes    Lawerance Sabal, RN 10/10/2022, 3:02 PM

## 2022-10-10 NOTE — Plan of Care (Signed)
  Problem: Education: Goal: Knowledge of disease or condition will improve Outcome: Progressing Goal: Knowledge of secondary prevention will improve (MUST DOCUMENT ALL) Outcome: Progressing Goal: Knowledge of patient specific risk factors will improve Dean Pratt N/A or DELETE if not current risk factor) Outcome: Progressing   Problem: Ischemic Stroke/TIA Tissue Perfusion: Goal: Complications of ischemic stroke/TIA will be minimized Outcome: Progressing   Problem: Coping: Goal: Will verbalize positive feelings about self Outcome: Progressing   Problem: Health Behavior/Discharge Planning: Goal: Ability to manage health-related needs will improve Outcome: Progressing   Problem: Self-Care: Goal: Ability to participate in self-care as condition permits will improve Outcome: Progressing   Problem: Nutrition: Goal: Risk of aspiration will decrease Outcome: Progressing
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
## 2022-10-10 NOTE — Progress Notes (Signed)
PROGRESS NOTE                                                                                                                                                                                                             Patient Demographics:    Dean Pratt, is a 87 y.o. male, DOB - [birth year], WUJ:[id number]  Outpatient Primary MD for the patient is [name], MD    LOS - 0  Admit date - 10/09/2022    Chief Complaint  Patient presents with   Altered Mental Status       Brief Narrative (HPI from H&P)   87 y.o. 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   Patient currently resides in the independent living portion of [facility] and was actually transitioning to the assisted living portion earlier today.   He was in the bathroom this morning sitting on a stool when he began to brush his teeth.  His spouse stepped out of the bathroom for a moment and then when she returned saw him with his head laid down on the sink.  He was seemingly unconscious and his spouse was unable to arouse him.  EMS were called and he was brought to the ED.   On arrival patient was still difficult to arouse, only responsive to painful stimuli.  He arrived as a code stroke and was seen by neurology.  Since then mentation has improved and he is conversant and AO x 4.   Patient has had issues with recent deconditioning, usually ambulates with the cane but has been requiring a walker in the last couple weeks.  Has been in and out of the ED for confusion and muscle spasms in the legs, he was seen in the ER by the ER physician and the neurologist, admitted for further workup for possible syncope.   Subjective:     Dean Pratt today has, No headache, No chest pain, does have some dysuria and suprapubic abdominal pain - No Nausea, No new weakness tingling or numbness, no SOB   Assessment  & Plan :   Acute encephalopathy with aphasia and RUE weakness, History  of embolic CVA in the right occipital lobe and remote ICH:  Initially encephalopathic and unresponsive.  Mentation improved at time of admission without focal deficits.  MRI brain and EEG both nonacute, has history of previous right occipital stroke, continue aspirin, PT OT and speech eval, further recommendations per neurology.  Advance activity.   Chronic atrial fibrillation: Remains in atrial fibrillation with controlled rate.  Resume home dose diltiazem.  Not on anticoagulation due to history of hemorrhagic stroke in microhemorrhages seen on previous MRI brain.  Continue aspirin 81 mg daily.   Acute kidney injury: Creatinine 1.44 on admission compared to baseline around 1.1-1.2.  Continue IV fluid hydration overnight and pending labs in AM.  Holding NSAIDs, ARB.   Hypertension: Continue Cardizem at home dose   Polycythemia vera: Chronic and stable.  Follows with heme-onc.  Continue hydroxyurea and aspirin 81 mg daily.   Hyperlipidemia: Continue rosuvastatin.   Vascular dementia: Seems to be back at his baseline at time of admission.  Continue Aricept and Namenda.  He was actually supposed to transition from independent living to assisted living portion of his facility day of admission.  Dysuria and suprapubic pain.  Check UA again, CT abdomen pelvis done on admission was stable, Rocephin empirically for now.        Condition - Fair  Family Communication  :    Called wife Eber Jones [phone] 10/10/2022 at 7:42 AM   Code Status :  Full  Consults  :  Neuro  PUD Prophylaxis :     Procedures  :            Disposition Plan  :    Status is: Observation   DVT Prophylaxis  :    enoxaparin (LOVENOX) injection 40 mg Start:  10/10/22 2200    Lab Results  Component Value Date   PLT 341 10/09/2022    Diet :  Diet Order             Diet NPO time specified  Diet effective now                    Inpatient Medications  Scheduled Meds:   stroke: early stages of recovery book   Does not apply Once   aspirin EC  81 mg Oral Daily   bisacodyl  10 mg Rectal Once   docusate sodium  200 mg Oral BID   donepezil  10 mg Oral QHS   enoxaparin (LOVENOX) injection  40 mg Subcutaneous Q24H   hydroxyurea  500 mg Oral Daily   memantine  10 mg Oral BID   polyethylene glycol  17 g Oral BID   rosuvastatin  10 mg Oral Daily   Continuous Infusions:  sodium chloride     PRN Meds:.acetaminophen **OR** acetaminophen (TYLENOL) oral liquid 160 mg/5 mL **OR** acetaminophen, LORazepam, ondansetron (ZOFRAN) IV, senna-docusate  Antibiotics  :    Anti-infectives (From admission, onward)    None         Objective:   Vitals:   10/09/22 2000 10/09/22 2137 10/10/22 0122 10/10/22 0439  BP: 121/80 (!) 143/87 (!) 154/90 129/74  Pulse: 63 88 76 71  Resp: 20 (!) 23 (!) 23 19  Temp:  (!) 97.4 F (36.3 C) 97.6 F (36.4 C) 97.9 F (36.6 C)  TempSrc:  Oral Oral Oral  SpO2: 92% 94% 94% 93%  Weight:      Height:        Wt Readings from Last 3 Encounters:  10/09/22  73.5 kg  09/29/22 73.5 kg  09/28/22 73.8 kg     Intake/Output Summary (Last 24 hours) at 10/10/2022 0734 Last data filed at 10/09/2022 1800 Gross per 24 hour  Intake --  Output 450 ml  Net -450 ml     Physical Exam  Awake Alert, No new F.N deficits, Normal affect [place].AT,PERRAL Supple Neck, No JVD,   Symmetrical Chest wall movement, Good air movement bilaterally, CTAB RRR,No Gallops,Rubs or new Murmurs,  +ve B.Sounds, Abd Soft, No tenderness,   No Cyanosis, Clubbing or edema     Data Review:    Recent Labs  Lab 10/07/22 2125 10/09/22 1230 10/09/22 1302  WBC 8.3 7.6  --   HGB 16.8 15.9 16.3  HCT 53.6* 50.2 48.0  PLT 382 341  --    MCV 87.4 86.6  --   MCH 27.4 27.4  --   MCHC 31.3 31.7  --   RDW 16.9* 16.5*  --   LYMPHSABS  --  0.9  --   MONOABS  --  0.6  --   EOSABS  --  0.1  --   BASOSABS  --  0.1  --     Recent Labs  Lab 10/07/22 2125 10/09/22 1230 10/09/22 1302  NA 141 145 146*  K 3.4* 3.5 3.4*  CL 109 107 108  CO2 23 25  --   ANIONGAP 9 13  --   GLUCOSE 96 112* 108*  BUN 15 14 15   CREATININE 1.25* 1.44* 1.60*  AST 19 21  --   ALT 14 14  --   ALKPHOS 66 61  --   BILITOT 0.9 0.5  --   ALBUMIN 4.3 3.8  --   INR  --  1.2  --   CALCIUM 9.6 9.6  --       Recent Labs  Lab 10/07/22 2125 10/09/22 1230  INR  --  1.2  CALCIUM 9.6 9.6    --------------------------------------------------------------------------------------------------------------- Lab Results  Component Value Date   CHOL 104 01/10/2021   HDL 47 01/10/2021   LDLCALC 39 01/10/2021   TRIG 89 01/10/2021   CHOLHDL 2.2 01/10/2021    Lab Results  Component Value Date   HGBA1C 5.2 01/10/2021   No results for input(s): "TSH", "T4TOTAL", "FREET4", "T3FREE", "THYROIDAB" in the last 72 hours. No results for input(s): "VITAMINB12", "FOLATE", "FERRITIN", "TIBC", "IRON", "RETICCTPCT" in the last 72 hours. ------------------------------------------------------------------------------------------------------------------ Cardiac Enzymes No results for input(s): "CKMB", "TROPONINI", "MYOGLOBIN" in the last 168 hours.  Invalid input(s): "CK"  Micro Results No results found for this or any previous visit (from the past 240 hour(s)).  Radiology Reports MR BRAIN WO CONTRAST  Result Date: 10/09/2022 CLINICAL DATA:  Neuro deficit, acute, stroke suspected. Ataxia and falls. EXAM: MRI HEAD WITHOUT CONTRAST TECHNIQUE: Multiplanar, multiecho pulse sequences of the brain and surrounding structures were obtained without intravenous contrast. COMPARISON:  Head CT 10/09/2022.  MRI brain 04/30/2021. FINDINGS: Brain: No acute infarct or hemorrhage.  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 Background of moderate chronic small-vessel disease. No hydrocephalus or extra-axial collection. No mass or midline shift. Vascular: Normal flow voids. Skull and upper cervical spine: Normal marrow signal. Sinuses/Orbits: No acute findings. Other: None. IMPRESSION: 1. No acute intracranial abnormality or mass. 2. 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 Electronically Signed   By: Orvan Falconer M.D.   On: 10/09/2022 16:42   EEG adult  Result Date: 10/09/2022 Charlsie Quest, MD  10/09/2022  6:04 PM Patient Name: AYSON BLAHNIK MRN: [id number] Epilepsy Attending: Charlsie Quest Referring Physician/Provider: Elmer Picker, NP Date: 10/09/2022 Duration: 33.38 mins Patient history: 87yo m with aphasia getting eeg to evaluate for seizure Level of alertness: Awake, asleep AEDs during EEG study: Ativan Technical aspects: This EEG study was done with scalp electrodes positioned according to the 10-20 International system of electrode placement. Electrical activity was reviewed with band pass filter of 1-70Hz , sensitivity of 7 uV/mm, display speed of 37mm/sec with a 60Hz  notched filter applied as appropriate. EEG data were recorded continuously and digitally stored.  Video monitoring was available and reviewed as appropriate. Description: The posterior dominant rhythm consists of 8 Hz activity of moderate voltage (25-35 uV) seen predominantly in posterior head regions, symmetric and reactive to eye opening and eye closing. Sleep was characterized by vertex waves, sleep spindles (12 to 14 Hz), maximal frontocentral region. EEG showed intermittent 5 to 6 Hz theta slowing in right parieto-occipital region. Hyperventilation and photic stimulation were not performed.   ABNORMALITY - Intermittent slow, right parieto-occipital region IMPRESSION: This study is suggestive of cortical dysfunction arising from right parieto-occipital region  likely secondary to underlying stroke. No seizures or epileptiform discharges were seen throughout the recording. Priyanka Annabelle Harman   CT ANGIO HEAD NECK W WO CM (CODE STROKE)  Result Date: 10/09/2022 CLINICAL DATA:  Code stroke EXAM: CT ANGIOGRAPHY HEAD AND NECK TECHNIQUE: Multidetector CT imaging of the head and neck was performed using the standard protocol during bolus administration of intravenous contrast. Multiplanar CT image reconstructions and MIPs were obtained to evaluate the vascular anatomy. Carotid stenosis measurements (when applicable) are obtained utilizing NASCET criteria, using the distal internal carotid diameter as the denominator. RADIATION DOSE REDUCTION: This exam was performed according to the departmental dose-optimization program which includes automated exposure control, adjustment of the mA and/or kV according to patient size and/or use of iterative reconstruction technique. CONTRAST:  75mL OMNIPAQUE IOHEXOL 350 MG/ML SOLN COMPARISON:  CT head 2 days prior, CTA head and neck 01/10/2021 FINDINGS: CT HEAD FINDINGS Brain: There is no acute intracranial hemorrhage, extra-axial fluid collection, or acute infarct Background parenchymal volume loss with prominence of the ventricular system and extra-axial CSF spaces is unchanged. A remote right occipital infarct is unchanged. Additional confluent hypodensity in the supratentorial white matter likely reflecting sequela of underlying chronic small-vessel ischemic change is stable. Gray-white differentiation is preserved The pituitary and suprasellar region are normal. There is no mass lesion. There is no mass effect or midline shift. Vascular: See below. Skull: Normal. Negative for fracture or focal lesion. Sinuses/Orbits: The paranasal sinuses are clear. Bilateral lens implants are in place. The globes and orbits are otherwise unremarkable. Other: The mastoid air cells and middle ear cavities are clear. Review of the MIP images confirms the  above findings CTA NECK FINDINGS Aortic arch: There is calcified plaque in the imaged aortic arch. The origins of the major branch vessels are patent. The subclavian arteries are patent to the level imaged with mild stenosis proximally on the right. Right carotid system: The right common, internal, and external carotid arteries are patent, with mild plaque at the bifurcation but no significant stenosis or occlusion. There is no evidence of dissection or aneurysm. Left carotid system: The left common, internal, and external carotid arteries are patent, with mild plaque at the bifurcation but no significant stenosis or occlusion there is no evidence of dissection or aneurysm. Vertebral arteries: The vertebral arteries are patent, without significant stenosis or  occlusion there is no evidence of dissection or aneurysm. Skeleton: There is no acute osseous abnormality or suspicious osseous lesion. Advanced multilevel degenerative change throughout the cervical spine with mild anterolisthesis of C4 on C5 and mild compression of the C5 vertebral body are unchanged. There is no visible canal hematoma. Other neck: The soft tissues of the neck are unremarkable. Upper chest: There is emphysema in the lung apices. Review of the MIP images confirms the above findings CTA HEAD FINDINGS Anterior circulation: There is calcified plaque in the intracranial ICAs without significant stenosis or occlusion. The bilateral MCAs and ACAS are patent, without proximal stenosis or occlusion. The anterior communicating artery is normal There is no aneurysm or AVM. Posterior circulation: There is calcified plaque in the bilateral V4 segments without significant stenosis or occlusion. The basilar artery is patent. The right PICA origin is not definitely seen. The other major cerebellar arteries appear patent. The bilateral PCAs are patent, without proximal stenosis or occlusion. Posterior communicating arteries are not definitely seen. There is  no aneurysm or AVM. Dissection. Venous sinuses: Not well evaluated due to bolus timing. Anatomic variants: None. Review of the MIP images confirms the above findings IMPRESSION: 1. No acute intracranial pathology on initial noncontrast head CT. 2. Overall mild atherosclerotic plaque in the vasculature of the head and neck with no hemodynamically significant stenosis, occlusion, or Findings communicated to Dr Otelia Limes at 1:39 pm. Electronically Signed   By: Lesia Hausen M.D.   On: 10/09/2022 13:47   CT HEAD CODE STROKE WO CONTRAST  Result Date: 10/09/2022 CLINICAL DATA:  Code stroke EXAM: CT ANGIOGRAPHY HEAD AND NECK TECHNIQUE: Multidetector CT imaging of the head and neck was performed using the standard protocol during bolus administration of intravenous contrast. Multiplanar CT image reconstructions and MIPs were obtained to evaluate the vascular anatomy. Carotid stenosis measurements (when applicable) are obtained utilizing NASCET criteria, using the distal internal carotid diameter as the denominator. RADIATION DOSE REDUCTION: This exam was performed according to the departmental dose-optimization program which includes automated exposure control, adjustment of the mA and/or kV according to patient size and/or use of iterative reconstruction technique. CONTRAST:  75mL OMNIPAQUE IOHEXOL 350 MG/ML SOLN COMPARISON:  CT head 2 days prior, CTA head and neck 01/10/2021 FINDINGS: CT HEAD FINDINGS Brain: There is no acute intracranial hemorrhage, extra-axial fluid collection, or acute infarct Background parenchymal volume loss with prominence of the ventricular system and extra-axial CSF spaces is unchanged. A remote right occipital infarct is unchanged. Additional confluent hypodensity in the supratentorial white matter likely reflecting sequela of underlying chronic small-vessel ischemic change is stable. Gray-white differentiation is preserved The pituitary and suprasellar region are normal. There is no mass  lesion. There is no mass effect or midline shift. Vascular: See below. Skull: Normal. Negative for fracture or focal lesion. Sinuses/Orbits: The paranasal sinuses are clear. Bilateral lens implants are in place. The globes and orbits are otherwise unremarkable. Other: The mastoid air cells and middle ear cavities are clear. Review of the MIP images confirms the above findings CTA NECK FINDINGS Aortic arch: There is calcified plaque in the imaged aortic arch. The origins of the major branch vessels are patent. The subclavian arteries are patent to the level imaged with mild stenosis proximally on the right. Right carotid system: The right common, internal, and external carotid arteries are patent, with mild plaque at the bifurcation but no significant stenosis or occlusion. There is no evidence of dissection or aneurysm. Left carotid system: The left common, internal, and  external carotid arteries are patent, with mild plaque at the bifurcation but no significant stenosis or occlusion there is no evidence of dissection or aneurysm. Vertebral arteries: The vertebral arteries are patent, without significant stenosis or occlusion there is no evidence of dissection or aneurysm. Skeleton: There is no acute osseous abnormality or suspicious osseous lesion. Advanced multilevel degenerative change throughout the cervical spine with mild anterolisthesis of C4 on C5 and mild compression of the C5 vertebral body are unchanged. There is no visible canal hematoma. Other neck: The soft tissues of the neck are unremarkable. Upper chest: There is emphysema in the lung apices. Review of the MIP images confirms the above findings CTA HEAD FINDINGS Anterior circulation: There is calcified plaque in the intracranial ICAs without significant stenosis or occlusion. The bilateral MCAs and ACAS are patent, without proximal stenosis or occlusion. The anterior communicating artery is normal There is no aneurysm or AVM. Posterior circulation:  There is calcified plaque in the bilateral V4 segments without significant stenosis or occlusion. The basilar artery is patent. The right PICA origin is not definitely seen. The other major cerebellar arteries appear patent. The bilateral PCAs are patent, without proximal stenosis or occlusion. Posterior communicating arteries are not definitely seen. There is no aneurysm or AVM. Dissection. Venous sinuses: Not well evaluated due to bolus timing. Anatomic variants: None. Review of the MIP images confirms the above findings IMPRESSION: 1. No acute intracranial pathology on initial noncontrast head CT. 2. Overall mild atherosclerotic plaque in the vasculature of the head and neck with no hemodynamically significant stenosis, occlusion, or Findings communicated to Dr Otelia Limes at 1:39 pm. Electronically Signed   By: Lesia Hausen M.D.   On: 10/09/2022 13:47   CT ABDOMEN PELVIS W CONTRAST  Result Date: 10/07/2022 CLINICAL DATA:  Acute nonlocalized abdominal pain EXAM: CT ABDOMEN AND PELVIS WITH CONTRAST TECHNIQUE: Multidetector CT imaging of the abdomen and pelvis was performed using the standard protocol following bolus administration of intravenous contrast. RADIATION DOSE REDUCTION: This exam was performed according to the departmental dose-optimization program which includes automated exposure control, adjustment of the mA and/or kV according to patient size and/or use of iterative reconstruction technique. CONTRAST:  OMNIPAQUE IOHEXOL 300 MG/ML  SOLN COMPARISON:  02/28/2008 FINDINGS: Lower chest: Lung bases are clear. Hepatobiliary: Cystic change in the gallbladder consistent with adenomyosis. Subcentimeter low-attenuation lesions in the liver are unchanged since prior study, likely cysts. No cholelithiasis or bile duct dilatation. Pancreas: Unremarkable. No pancreatic ductal dilatation or surrounding inflammatory changes. Spleen: Normal in size without focal abnormality. Adrenals/Urinary Tract: No adrenal  gland nodules. Multiple bilateral renal cysts. Largest on the right measures 3.4 cm diameter. There is mild progression since the prior study but the appearance is that of simple cysts. This is likely benign and no imaging follow-up is indicated. Nephrograms are symmetrical. No hydronephrosis or hydroureter. Bladder is normal. Stomach/Bowel: Stomach, small bowel, and colon are not abnormally distended. No wall thickening or inflammatory changes are appreciated. Previous cecal mass or thickening have resolved or been resected in the interval. Appendix is surgically absent. Vascular/Lymphatic: Aortic atherosclerosis. No enlarged abdominal or pelvic lymph nodes. Reproductive: Prostate gland is mildly enlarged. Other: No free air or free fluid in the abdomen. Small periumbilical hernia containing fat. Musculoskeletal: Degenerative changes in the spine. Lumbar scoliosis convex towards the left. No acute bony abnormalities. IMPRESSION: 1. No acute process demonstrated in the abdomen or pelvis. No evidence of bowel obstruction or inflammation. 2. Adenomyosis of the gallbladder. 3. Aortic atherosclerosis. 4.  Prostate gland is enlarged. Electronically Signed   By: Burman Nieves M.D.   On: 10/07/2022 23:16   CT Head Wo Contrast  Result Date: 10/07/2022 CLINICAL DATA:  Altered mental status EXAM: CT HEAD WITHOUT CONTRAST TECHNIQUE: Contiguous axial images were obtained from the base of the skull through the vertex without intravenous contrast. RADIATION DOSE REDUCTION: This exam was performed according to the departmental dose-optimization program which includes automated exposure control, adjustment of the mA and/or kV according to patient size and/or use of iterative reconstruction technique. COMPARISON:  09/28/2022 FINDINGS: Brain: No evidence of acute infarction, hemorrhage, hydrocephalus, extra-axial collection or mass lesion/mass effect. Old right occipital infarct. There is periventricular hypoattenuation  compatible with chronic microvascular disease. Vascular: Calcific atherosclerosis at the skull base. Skull: Negative Sinuses/Orbits: Paranasal sinuses are clear. No mastoid effusion. Normal orbits. Other: None IMPRESSION: 1. No acute intracranial abnormality. 2. Old right occipital infarct and findings of chronic microvascular disease. Electronically Signed   By: Deatra Robinson M.D.   On: 10/07/2022 23:09   DG Chest Port 1 View  Result Date: 10/07/2022 CLINICAL DATA:  Altered mental status EXAM: PORTABLE CHEST 1 VIEW COMPARISON:  04/13/2022 FINDINGS: Cardiac shadow is within normal limits. Aortic calcifications are noted. Lungs are well aerated bilaterally. No bony abnormality is noted. IMPRESSION: No active disease. Electronically Signed   By: Alcide Clever M.D.   On: 10/07/2022 22:13      Signature  -   Susa Raring M.D on 10/10/2022 at 7:34 AM   -  To page go to [email or web address]

## 2022-10-11 DIAGNOSIS — G9340 Encephalopathy, unspecified: Secondary | ICD-10-CM | POA: Diagnosis not present

## 2022-10-11 LAB — URINALYSIS, W/ REFLEX TO CULTURE (INFECTION SUSPECTED)
Bacteria, UA: NONE SEEN
Bilirubin Urine: NEGATIVE
Glucose, UA: NEGATIVE mg/dL
Hgb urine dipstick: NEGATIVE
Ketones, ur: 5 mg/dL — AB
Leukocytes,Ua: NEGATIVE
Nitrite: NEGATIVE
Protein, ur: NEGATIVE mg/dL
Specific Gravity, Urine: 1.014 (ref 1.005–1.030)
pH: 5 (ref 5.0–8.0)

## 2022-10-11 LAB — BASIC METABOLIC PANEL
Anion gap: 10 (ref 5–15)
BUN: 7 mg/dL — ABNORMAL LOW (ref 8–23)
CO2: 22 mmol/L (ref 22–32)
Calcium: 8.9 mg/dL (ref 8.9–10.3)
Chloride: 109 mmol/L (ref 98–111)
Creatinine, Ser: 1.07 mg/dL (ref 0.61–1.24)
GFR, Estimated: >60 mL/min (ref >60)
Glucose, Bld: 85 mg/dL (ref 70–99)
Potassium: 3.5 mmol/L (ref 3.5–5.1)
Sodium: 141 mmol/L (ref 135–145)

## 2022-10-11 LAB — MAGNESIUM: Magnesium: 1.7 mg/dL (ref 1.7–2.4)

## 2022-10-11 LAB — PHOSPHORUS: Phosphorus: 3.5 mg/dL (ref 2.5–4.6)

## 2022-10-11 MED ORDER — POTASSIUM CHLORIDE CRYS ER 20 MEQ PO TBCR
40.0000 meq | EXTENDED_RELEASE_TABLET | Freq: Once | ORAL | Status: AC
  Administered 2022-10-11: 40 meq via ORAL
  Filled 2022-10-11: qty 2

## 2022-10-11 MED ORDER — CEPHALEXIN 500 MG PO CAPS: 500.0000 mg | ORAL_CAPSULE | Freq: Three times a day (TID) | ORAL | 0 refills | Status: DC

## 2022-10-11 MED ORDER — DILTIAZEM HCL ER COATED BEADS 180 MG PO CP24: 180.0000 mg | ORAL_CAPSULE | Freq: Every day | ORAL | Status: DC

## 2022-10-11 MED ORDER — PANTOPRAZOLE SODIUM 40 MG PO TBEC: 40.0000 mg | DELAYED_RELEASE_TABLET | Freq: Every day | ORAL | Status: DC

## 2022-10-11 MED ORDER — DILTIAZEM HCL ER COATED BEADS 180 MG PO CP24
180.0000 mg | ORAL_CAPSULE | Freq: Every day | ORAL | Status: DC
  Administered 2022-10-12 – 2022-10-13 (×2): 180 mg via ORAL
  Filled 2022-10-11 (×2): qty 1

## 2022-10-11 NOTE — Discharge Summary (Signed)
Dean Pratt ATF:[id number] DOB: [birth year] DOA: 10/09/2022  PCP: [name], MD  Admit date: 10/09/2022  Discharge date: 10/13/2022  Admitted From: SNF   Disposition:  ALF   Recommendations for Outpatient Follow-up:   Follow up with PCP in 1-2 weeks  PCP Please obtain BMP/CBC, 2 view CXR in 1week,  (see Discharge instructions)   PCP Please follow up on the following pending results: Needs close outpatient dementia workup with his primary neurologist.   Home Health: None   Equipment/Devices: None  Consultations: Neuro Discharge Condition: Stable    CODE STATUS: Full    Diet Recommendation: Heart Healthy - Soft  Chief Complaint  Patient presents with   Altered Mental Status     Brief history of present illness from the day of admission and additional interim summary    Note patient has been discharged on 10/11/2022 awaiting SNF bed.     87 y.o. 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    Patient currently resides in the independent [facility] of [facility] and was actually transitioning to the assisted living portion earlier today.   He was in the bathroom this morning sitting on a stool when he began to brush his teeth.  His spouse stepped out of the bathroom for a moment and then when she returned saw him with his head laid down on the sink.  He was seemingly unconscious and his spouse was unable to arouse him.  EMS were called and he was brought to the ED.   On arrival patient was still difficult to arouse, only responsive to painful stimuli.  He  arrived as a code stroke and was seen by neurology.  Since then mentation has improved and he is conversant and AO x 4.   Patient has had issues with recent deconditioning, usually ambulates with the cane but has been requiring a walker in the last couple weeks.  Has been in and out of the ED for confusion and muscle spasms in the legs, he was seen in the ER by the ER physician and the neurologist, admitted for further workup for possible syncope.                                                                  Hospital Course   Acute encephalopathy with aphasia and RUE weakness, History of embolic CVA in the right occipital lobe and remote ICH:   Initially encephalopathic and unresponsive, question due to orthostatic hypotension as blood pressure was soft upon admission, blood pressure medications adjusted, hydrated with IV fluids, blood pressure improved.  Mentation improved at time of admission without focal deficits.  MRI brain and EEG both nonacute, has history of previous right occipital stroke, continue aspirin, and by neurology cleared for discharge, needs close monitoring of blood pressure, orthostatics, close outpatient neurology follow-up for dementia workup and management, close to baseline now.    Chronic atrial fibrillation: Has underlying A-fib, blood pressure was slightly low upon admission diltiazem dose adjusted.  Not on anticoagulation due to history of hemorrhagic stroke in microhemorrhages seen on previous MRI brain.  Continue aspirin 81 mg daily.   Acute kidney injury: Likely due to transient hypotension resolved with IV fluids.   Hypertension: Continue Cardizem at home dose   Polycythemia vera: Chronic and stable.  Follows with heme-onc.  Continue hydroxyurea and aspirin 81 mg daily.   Hyperlipidemia: Continue rosuvastatin.   Vascular dementia: Seems to be back at his baseline at time of admission.  Continue Aricept and Namenda.  He was actually supposed to  transition from independent living to assisted living portion of his facility day of admission.  Will discharge Per TOC arrangements, needs outpatient neurology follow-up and workup for dementia.   Dysuria and suprapubic pain.  stable UA, resolved with supportive care.  Discharge diagnosis     Principal Problem:   Acute encephalopathy Active Problems:   Chronic atrial fibrillation (HCC)   AKI (acute kidney injury) (HCC)   Hyperlipidemia   Essential hypertension   Polycythemia vera (HCC)   Vascular dementia [facility])    Discharge instructions    Discharge Instructions     Discharge instructions   Complete by: As directed    Follow with Primary MD [name], MD follow-up with the recommended neurologist within 1 to 2 weeks for outpatient dementia workup in 7 days   Get CBC, CMP, 2 view Chest X ray -  checked next visit with your primary MD or SNF MD    Activity: As tolerated with Full fall precautions use walker/cane & assistance as needed  Disposition ALF  Diet: Soft Heart Healthy with feeding assistance and aspiration precautions.  Special Instructions: If you have smoked or chewed Tobacco  in the last 2 yrs please stop smoking, stop any regular Alcohol  and or any Recreational drug use.  On your next visit with your primary care physician please Get Medicines reviewed and adjusted.  Please request your Prim.MD to go over all Hospital Tests and Procedure/Radiological results at the follow up, please get all Hospital records sent to your Prim MD by signing hospital release before you go home.  If you experience worsening of your admission symptoms, develop shortness of breath, life threatening emergency, suicidal or homicidal thoughts you must seek medical attention immediately by calling 911 or calling your MD immediately  if symptoms less severe.  You Must read complete instructions/literature along with all the possible adverse reactions/side effects for all the Medicines  you take and that have been prescribed to you. Take any new Medicines after you have completely understood and accpet all the possible adverse reactions/side effects.   Increase activity slowly   Complete by: As directed        Discharge Medications   Allergies as of 10/13/2022       Reactions   Warfarin And Related Other (See Comments)   Intercranial bleed   Ace Inhibitors Cough        Medication List     STOP taking these medications    famotidine 20 MG tablet Commonly known as: PEPCID   losartan  25 MG tablet Commonly known as: COZAAR       TAKE these medications    acetaminophen 325 MG tablet Commonly known as: TYLENOL Take 650 mg by mouth every 6 (six) hours as needed for mild pain.   allopurinol 100 MG tablet Commonly known as: ZYLOPRIM Take 100 mg by mouth at bedtime.   aspirin EC 81 MG tablet Take 1 tablet (81 mg total) by mouth daily. Swallow whole.   cholecalciferol 25 MCG (1000 UNIT) tablet Commonly known as: VITAMIN D3 Take 1,000 Units by mouth daily.   cyanocobalamin 1000 MCG tablet Commonly known as: VITAMIN B12 Take 1,000 mcg by mouth daily.   diltiazem 180 MG 24 hr capsule Commonly known as: CARDIZEM CD Take 1 capsule (180 mg total) by mouth daily. What changed:  medication strength See the new instructions.   donepezil 10 MG tablet Commonly known as: ARICEPT TAKE ONE TABLET BY MOUTH EVERYDAY AT BEDTIME What changed: See the new instructions.   escitalopram 10 MG tablet Commonly known as: LEXAPRO Take 10 mg by mouth daily.   hydrALAZINE 50 MG tablet Commonly known as: APRESOLINE Take 1 tablet (50 mg total) by mouth every 8 (eight) hours.   hydroxyurea 500 MG capsule Commonly known as: HYDREA TAKE ONE CAPSULE BY MOUTH AT NOON   memantine 10 MG tablet Commonly known as: NAMENDA TAKE ONE TABLET BY MOUTH AT NOON and TAKE ONE TABLET BY MOUTH EVERYDAY AT BEDTIME What changed: See the new instructions.   OVER THE COUNTER  MEDICATION Place 1 spray into both nostrils daily as needed (congestion). Congestion nasal spray   pantoprazole 40 MG tablet Commonly known as: PROTONIX Take 1 tablet (40 mg total) by mouth daily.   rosuvastatin 10 MG tablet Commonly known as: CRESTOR Take 10 mg by mouth daily.   vitamin C 1000 MG tablet Take 1,000 mg by mouth daily.         Follow-up Information     [name], MD. Schedule an appointment as soon as possible for a visit in 1 week(s).   Specialty: Internal Medicine Contact information: [address] [place] [address] [phone]         [facility]. Schedule an appointment as soon as possible for a visit in 1 week(s).   Contact information: [address]     [address] [place] [address] [phone]                Major procedures and Radiology Reports - PLEASE review detailed and final reports thoroughly  -      MR BRAIN WO CONTRAST  Result Date: 10/09/2022 CLINICAL DATA:  Neuro deficit, acute, stroke suspected. Ataxia and falls. EXAM: MRI HEAD WITHOUT CONTRAST TECHNIQUE: Multiplanar, multiecho pulse sequences of the brain and surrounding structures were obtained without intravenous contrast. COMPARISON:  Head CT 10/09/2022.  MRI brain 04/30/2021. FINDINGS: Brain: No acute infarct or hemorrhage. 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 Background of moderate chronic small-vessel disease. No hydrocephalus or extra-axial collection. No mass or midline shift. Vascular: Normal flow voids. Skull and upper cervical spine: Normal marrow signal. Sinuses/Orbits: No acute findings. Other: None. IMPRESSION: 1. No acute intracranial abnormality or mass. 2. 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 Electronically Signed   By: Orvan Falconer M.D.   On: 10/09/2022 16:42   EEG adult  Result Date: 10/09/2022 Charlsie Quest, MD     10/09/2022  6:04 PM Patient Name:  Dean Pratt  CANIO CONTRERAZ MRN: [id number] Epilepsy Attending: Charlsie Quest Referring Physician/Provider: Elmer Picker, NP Date: 10/09/2022 Duration: 33.38 mins Patient history: 87yo m with aphasia getting eeg to evaluate for seizure Level of alertness: Awake, asleep AEDs during EEG study: Ativan Technical aspects: This EEG study was done with scalp electrodes positioned according to the 10-20 International system of electrode placement. Electrical activity was reviewed with band pass filter of 1-70Hz , sensitivity of 7 uV/mm, display speed of 27mm/sec with a 60Hz  notched filter applied as appropriate. EEG data were recorded continuously and digitally stored.  Video monitoring was available and reviewed as appropriate. Description: The posterior dominant rhythm consists of 8 Hz activity of moderate voltage (25-35 uV) seen predominantly in posterior head regions, symmetric and reactive to eye opening and eye closing. Sleep was characterized by vertex waves, sleep spindles (12 to 14 Hz), maximal frontocentral region. EEG showed intermittent 5 to 6 Hz theta slowing in right parieto-occipital region. Hyperventilation and photic stimulation were not performed.   ABNORMALITY - Intermittent slow, right parieto-occipital region IMPRESSION: This study is suggestive of cortical dysfunction arising from right parieto-occipital region likely secondary to underlying stroke. No seizures or epileptiform discharges were seen throughout the recording. Priyanka Annabelle Harman   CT ANGIO HEAD NECK W WO CM (CODE STROKE)  Result Date: 10/09/2022 CLINICAL DATA:  Code stroke EXAM: CT ANGIOGRAPHY HEAD AND NECK TECHNIQUE: Multidetector CT imaging of the head and neck was performed using the standard protocol during bolus administration of intravenous contrast. Multiplanar CT image reconstructions and MIPs were obtained to evaluate the vascular anatomy. Carotid stenosis measurements (when applicable) are obtained utilizing NASCET criteria, using the  distal internal carotid diameter as the denominator. RADIATION DOSE REDUCTION: This exam was performed according to the departmental dose-optimization program which includes automated exposure control, adjustment of the mA and/or kV according to patient size and/or use of iterative reconstruction technique. CONTRAST:  75mL OMNIPAQUE IOHEXOL 350 MG/ML SOLN COMPARISON:  CT head 2 days prior, CTA head and neck 01/10/2021 FINDINGS: CT HEAD FINDINGS Brain: There is no acute intracranial hemorrhage, extra-axial fluid collection, or acute infarct Background parenchymal volume loss with prominence of the ventricular system and extra-axial CSF spaces is unchanged. A remote right occipital infarct is unchanged. Additional confluent hypodensity in the supratentorial white matter likely reflecting sequela of underlying chronic small-vessel ischemic change is stable. Gray-white differentiation is preserved The pituitary and suprasellar region are normal. There is no mass lesion. There is no mass effect or midline shift. Vascular: See below. Skull: Normal. Negative for fracture or focal lesion. Sinuses/Orbits: The paranasal sinuses are clear. Bilateral lens implants are in place. The globes and orbits are otherwise unremarkable. Other: The mastoid air cells and middle ear cavities are clear. Review of the MIP images confirms the above findings CTA NECK FINDINGS Aortic arch: There is calcified plaque in the imaged aortic arch. The origins of the major branch vessels are patent. The subclavian arteries are patent to the level imaged with mild stenosis proximally on the right. Right carotid system: The right common, internal, and external carotid arteries are patent, with mild plaque at the bifurcation but no significant stenosis or occlusion. There is no evidence of dissection or aneurysm. Left carotid system: The left common, internal, and external carotid arteries are patent, with mild plaque at the bifurcation but no significant  stenosis or occlusion there is no evidence of dissection or aneurysm. Vertebral arteries: The vertebral arteries are patent, without significant stenosis or occlusion there is no evidence of dissection  or aneurysm. Skeleton: There is no acute osseous abnormality or suspicious osseous lesion. Advanced multilevel degenerative change throughout the cervical spine with mild anterolisthesis of C4 on C5 and mild compression of the C5 vertebral body are unchanged. There is no visible canal hematoma. Other neck: The soft tissues of the neck are unremarkable. Upper chest: There is emphysema in the lung apices. Review of the MIP images confirms the above findings CTA HEAD FINDINGS Anterior circulation: There is calcified plaque in the intracranial ICAs without significant stenosis or occlusion. The bilateral MCAs and ACAS are patent, without proximal stenosis or occlusion. The anterior communicating artery is normal There is no aneurysm or AVM. Posterior circulation: There is calcified plaque in the bilateral V4 segments without significant stenosis or occlusion. The basilar artery is patent. The right PICA origin is not definitely seen. The other major cerebellar arteries appear patent. The bilateral PCAs are patent, without proximal stenosis or occlusion. Posterior communicating arteries are not definitely seen. There is no aneurysm or AVM. Dissection. Venous sinuses: Not well evaluated due to bolus timing. Anatomic variants: None. Review of the MIP images confirms the above findings IMPRESSION: 1. No acute intracranial pathology on initial noncontrast head CT. 2. Overall mild atherosclerotic plaque in the vasculature of the head and neck with no hemodynamically significant stenosis, occlusion, or Findings communicated to Dr Otelia Limes at 1:39 pm. Electronically Signed   By: Lesia Hausen M.D.   On: 10/09/2022 13:47   CT HEAD CODE STROKE WO CONTRAST  Result Date: 10/09/2022 CLINICAL DATA:  Code stroke EXAM: CT ANGIOGRAPHY  HEAD AND NECK TECHNIQUE: Multidetector CT imaging of the head and neck was performed using the standard protocol during bolus administration of intravenous contrast. Multiplanar CT image reconstructions and MIPs were obtained to evaluate the vascular anatomy. Carotid stenosis measurements (when applicable) are obtained utilizing NASCET criteria, using the distal internal carotid diameter as the denominator. RADIATION DOSE REDUCTION: This exam was performed according to the departmental dose-optimization program which includes automated exposure control, adjustment of the mA and/or kV according to patient size and/or use of iterative reconstruction technique. CONTRAST:  75mL OMNIPAQUE IOHEXOL 350 MG/ML SOLN COMPARISON:  CT head 2 days prior, CTA head and neck 01/10/2021 FINDINGS: CT HEAD FINDINGS Brain: There is no acute intracranial hemorrhage, extra-axial fluid collection, or acute infarct Background parenchymal volume loss with prominence of the ventricular system and extra-axial CSF spaces is unchanged. A remote right occipital infarct is unchanged. Additional confluent hypodensity in the supratentorial white matter likely reflecting sequela of underlying chronic small-vessel ischemic change is stable. Gray-white differentiation is preserved The pituitary and suprasellar region are normal. There is no mass lesion. There is no mass effect or midline shift. Vascular: See below. Skull: Normal. Negative for fracture or focal lesion. Sinuses/Orbits: The paranasal sinuses are clear. Bilateral lens implants are in place. The globes and orbits are otherwise unremarkable. Other: The mastoid air cells and middle ear cavities are clear. Review of the MIP images confirms the above findings CTA NECK FINDINGS Aortic arch: There is calcified plaque in the imaged aortic arch. The origins of the major branch vessels are patent. The subclavian arteries are patent to the level imaged with mild stenosis proximally on the right.  Right carotid system: The right common, internal, and external carotid arteries are patent, with mild plaque at the bifurcation but no significant stenosis or occlusion. There is no evidence of dissection or aneurysm. Left carotid system: The left common, internal, and external carotid arteries are patent, with mild  plaque at the bifurcation but no significant stenosis or occlusion there is no evidence of dissection or aneurysm. Vertebral arteries: The vertebral arteries are patent, without significant stenosis or occlusion there is no evidence of dissection or aneurysm. Skeleton: There is no acute osseous abnormality or suspicious osseous lesion. Advanced multilevel degenerative change throughout the cervical spine with mild anterolisthesis of C4 on C5 and mild compression of the C5 vertebral body are unchanged. There is no visible canal hematoma. Other neck: The soft tissues of the neck are unremarkable. Upper chest: There is emphysema in the lung apices. Review of the MIP images confirms the above findings CTA HEAD FINDINGS Anterior circulation: There is calcified plaque in the intracranial ICAs without significant stenosis or occlusion. The bilateral MCAs and ACAS are patent, without proximal stenosis or occlusion. The anterior communicating artery is normal There is no aneurysm or AVM. Posterior circulation: There is calcified plaque in the bilateral V4 segments without significant stenosis or occlusion. The basilar artery is patent. The right PICA origin is not definitely seen. The other major cerebellar arteries appear patent. The bilateral PCAs are patent, without proximal stenosis or occlusion. Posterior communicating arteries are not definitely seen. There is no aneurysm or AVM. Dissection. Venous sinuses: Not well evaluated due to bolus timing. Anatomic variants: None. Review of the MIP images confirms the above findings IMPRESSION: 1. No acute intracranial pathology on initial noncontrast head CT. 2.  Overall mild atherosclerotic plaque in the vasculature of the head and neck with no hemodynamically significant stenosis, occlusion, or Findings communicated to Dr Otelia Limes at 1:39 pm. Electronically Signed   By: Lesia Hausen M.D.   On: 10/09/2022 13:47   CT ABDOMEN PELVIS W CONTRAST  Result Date: 10/07/2022 CLINICAL DATA:  Acute nonlocalized abdominal pain EXAM: CT ABDOMEN AND PELVIS WITH CONTRAST TECHNIQUE: Multidetector CT imaging of the abdomen and pelvis was performed using the standard protocol following bolus administration of intravenous contrast. RADIATION DOSE REDUCTION: This exam was performed according to the departmental dose-optimization program which includes automated exposure control, adjustment of the mA and/or kV according to patient size and/or use of iterative reconstruction technique. CONTRAST:  OMNIPAQUE IOHEXOL 300 MG/ML  SOLN COMPARISON:  02/28/2008 FINDINGS: Lower chest: Lung bases are clear. Hepatobiliary: Cystic change in the gallbladder consistent with adenomyosis. Subcentimeter low-attenuation lesions in the liver are unchanged since prior study, likely cysts. No cholelithiasis or bile duct dilatation. Pancreas: Unremarkable. No pancreatic ductal dilatation or surrounding inflammatory changes. Spleen: Normal in size without focal abnormality. Adrenals/Urinary Tract: No adrenal gland nodules. Multiple bilateral renal cysts. Largest on the right measures 3.4 cm diameter. There is mild progression since the prior study but the appearance is that of simple cysts. This is likely benign and no imaging follow-up is indicated. Nephrograms are symmetrical. No hydronephrosis or hydroureter. Bladder is normal. Stomach/Bowel: Stomach, small bowel, and colon are not abnormally distended. No wall thickening or inflammatory changes are appreciated. Previous cecal mass or thickening have resolved or been resected in the interval. Appendix is surgically absent. Vascular/Lymphatic: Aortic  atherosclerosis. No enlarged abdominal or pelvic lymph nodes. Reproductive: Prostate gland is mildly enlarged. Other: No free air or free fluid in the abdomen. Small periumbilical hernia containing fat. Musculoskeletal: Degenerative changes in the spine. Lumbar scoliosis convex towards the left. No acute bony abnormalities. IMPRESSION: 1. No acute process demonstrated in the abdomen or pelvis. No evidence of bowel obstruction or inflammation. 2. Adenomyosis of the gallbladder. 3. Aortic atherosclerosis. 4. Prostate gland is enlarged. Electronically Signed  By: Burman Nieves M.D.   On: 10/07/2022 23:16   CT Head Wo Contrast  Result Date: 10/07/2022 CLINICAL DATA:  Altered mental status EXAM: CT HEAD WITHOUT CONTRAST TECHNIQUE: Contiguous axial images were obtained from the base of the skull through the vertex without intravenous contrast. RADIATION DOSE REDUCTION: This exam was performed according to the departmental dose-optimization program which includes automated exposure control, adjustment of the mA and/or kV according to patient size and/or use of iterative reconstruction technique. COMPARISON:  09/28/2022 FINDINGS: Brain: No evidence of acute infarction, hemorrhage, hydrocephalus, extra-axial collection or mass lesion/mass effect. Old right occipital infarct. There is periventricular hypoattenuation compatible with chronic microvascular disease. Vascular: Calcific atherosclerosis at the skull base. Skull: Negative Sinuses/Orbits: Paranasal sinuses are clear. No mastoid effusion. Normal orbits. Other: None IMPRESSION: 1. No acute intracranial abnormality. 2. Old right occipital infarct and findings of chronic microvascular disease. Electronically Signed   By: Deatra Robinson M.D.   On: 10/07/2022 23:09   DG Chest Port 1 View  Result Date: 10/07/2022 CLINICAL DATA:  Altered mental status EXAM: PORTABLE CHEST 1 VIEW COMPARISON:  04/13/2022 FINDINGS: Cardiac shadow is within normal limits. Aortic  calcifications are noted. Lungs are well aerated bilaterally. No bony abnormality is noted. IMPRESSION: No active disease. Electronically Signed   By: Alcide Clever M.D.   On: 10/07/2022 22:13   CT Head Wo Contrast  Result Date: 09/28/2022 CLINICAL DATA:  Altered mental status. EXAM: CT HEAD WITHOUT CONTRAST TECHNIQUE: Contiguous axial images were obtained from the base of the skull through the vertex without intravenous contrast. RADIATION DOSE REDUCTION: This exam was performed according to the departmental dose-optimization program which includes automated exposure control, adjustment of the mA and/or kV according to patient size and/or use of iterative reconstruction technique. COMPARISON:  04/13/2022 FINDINGS: Brain: There is no evidence for acute hemorrhage, hydrocephalus, mass lesion, or abnormal extra-axial fluid collection. No definite CT evidence for acute infarction. Diffuse loss of parenchymal volume is consistent with atrophy. Patchy low attenuation in the deep hemispheric and periventricular white matter is nonspecific, but likely reflects chronic microvascular ischemic demyelination. 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 Vascular: No hyperdense vessel or unexpected calcification. Skull: No evidence for fracture. No worrisome lytic or sclerotic lesion. Sinuses/Orbits: The visualized paranasal sinuses and mastoid air cells are clear. Ethmoid sinus disease seen previously has largely resolved in the interval. Visualized portions of the globes and intraorbital fat are unremarkable. Other: None. IMPRESSION: 1. No acute intracranial abnormality. 2. Atrophy with chronic small vessel white matter ischemic disease. 3. 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 Electronically Signed   By: Kennith Center M.D.   On: 09/28/2022 13:32    Micro Results    No results found for this or any previous visit (from the past 240 hour(s)).  Today   Subjective    [place] today has no headache,no  chest abdominal pain,no new weakness tingling or numbness, feels much better   Objective   Blood pressure (!) 160/110, pulse 67, temperature 97.6 F (36.4 C), temperature source Axillary, resp. rate 18, height 6\' 1"  (1.854 m), weight 73.5 kg, SpO2 94%.   Intake/Output Summary (Last 24 hours) at 10/13/2022 0848 Last data filed at 10/13/2022 0600 Gross per 24 hour  Intake --  Output 300 ml  Net -300 ml    Exam  Awake Alert, No new F.N deficits,    .AT,PERRAL Supple Neck,   Symmetrical Chest wall movement, Good air movement bilaterally, CTAB RRR,No Gallops,   +ve B.Sounds,  Abd Soft, Non tender,  No Cyanosis, Clubbing or edema    Data Review   Recent Labs  Lab 10/07/22 2125 10/09/22 1230 10/09/22 1302 10/10/22 0807  WBC 8.3 7.6  --  7.7  HGB 16.8 15.9 16.3 15.6  HCT 53.6* 50.2 48.0 49.6  PLT 382 341  --  321  MCV 87.4 86.6  --  88.4  MCH 27.4 27.4  --  27.8  MCHC 31.3 31.7  --  31.5  RDW 16.9* 16.5*  --  15.8*  LYMPHSABS  --  0.9  --   --   MONOABS  --  0.6  --   --   EOSABS  --  0.1  --   --   BASOSABS  --  0.1  --   --     Recent Labs  Lab 10/07/22 2125 10/09/22 1230 10/09/22 1302 10/10/22 0807 10/11/22 0554  NA 141 145 146* 143 141  K 3.4* 3.5 3.4* 3.4* 3.5  CL 109 107 108 112* 109  CO2 23 25  --  22 22  ANIONGAP 9 13  --  9 10  GLUCOSE 96 112* 108* 91 85  BUN 15 14 15 9  7*  CREATININE 1.25* 1.44* 1.60* 1.20 1.07  AST 19 21  --   --   --   ALT 14 14  --   --   --   ALKPHOS 66 61  --   --   --   BILITOT 0.9 0.5  --   --   --   ALBUMIN 4.3 3.8  --   --   --   INR  --  1.2  --   --   --   HGBA1C  --   --   --  5.4  --   MG  --   --   --   --  1.7  CALCIUM 9.6 9.6  --  8.8* 8.9    Total Time in preparing paper work, data evaluation and todays exam - 35 minutes  Signature  -    Susa Raring M.D on 10/13/2022 at 8:48 AM   -  To page go to [email or web address]
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
## 2022-10-11 NOTE — TOC CM/SW Note (Signed)
CSW was contacted by the provider (PS) requesting disposition support.   CSW made contact with the patient's daughter Fredi Geiler / [phone]. Melissa reported she is the medical POA & durable POA. The pt came from the facility [facility] (independent living) and he is set to transition to [facility] (Assisted Living). The admissions director has informed the POA the pt can only return to either program/service on a weekday. So he cannot transition back today to the facility. The facility does not have anyone their to accept him. The admissions director returns back on Monday where he can be formally transitioned to the facility [facility] (assisted living with skilled nursing support). The POA requested no information be updated the wife first, but her (POA) first because the wife does not remember things and gets confused.  This Clinical research associate updated the clinical team/provider to the information noted above.   Weekday CSW will follow-up the patient's disposition by way of coordinating transition to the noted facility [facility].   No other needs identified of this Clinical research associate. Please contact TOC if there is additional disposition support needed.

## 2022-10-11 NOTE — Progress Notes (Signed)
Patient agitated and refusing padded side rails this morning.

## 2022-10-11 NOTE — Progress Notes (Signed)
1800-Patient complaining that his buttocks hurts. Upon reassessment this evening, skin is WNL-intact and dry. RN applied sacral foam prophylactically and educated patient on the importance of repositioning frequently.

## 2022-10-11 NOTE — Plan of Care (Signed)
  Problem: Clinical Measurements: Goal: Cardiovascular complication will be avoided Outcome: Progressing   Problem: Elimination: Goal: Will not experience complications related to urinary retention Outcome: Progressing   

## 2022-10-11 NOTE — Discharge Instructions (Signed)
Follow with Primary MD [name], MD follow-up with the recommended neurologist within 1 to 2 weeks for outpatient dementia workup in 7 days   Get CBC, CMP, 2 view Chest X ray -  checked next visit with your primary MD or SNF MD    Activity: As tolerated with Full fall precautions use walker/cane & assistance as needed  Disposition SNF  Diet: Soft Heart Healthy with feeding assistance and aspiration precautions.  Special Instructions: If you have smoked or chewed Tobacco  in the last 2 yrs please stop smoking, stop any regular Alcohol  and or any Recreational drug use.  On your next visit with your primary care physician please Get Medicines reviewed and adjusted.  Please request your Prim.MD to go over all Hospital Tests and Procedure/Radiological results at the follow up, please get all Hospital records sent to your Prim MD by signing hospital release before you go home.  If you experience worsening of your admission symptoms, develop shortness of breath, life threatening emergency, suicidal or homicidal thoughts you must seek medical attention immediately by calling 911 or calling your MD immediately  if symptoms less severe.  You Must read complete instructions/literature along with all the possible adverse reactions/side effects for all the Medicines you take and that have been prescribed to you. Take any new Medicines after you have completely understood and accpet all the possible adverse reactions/side effects.

## 2022-10-12 DIAGNOSIS — G9340 Encephalopathy, unspecified: Secondary | ICD-10-CM | POA: Diagnosis not present

## 2022-10-12 NOTE — Progress Notes (Signed)
Occupational Therapy Treatment Patient Details Name: Dean Pratt MRN: [id number] DOB: [birth year] Today's Date: 10/12/2022   History of present illness Dean Pratt, is a 87 y.o. male who presented to the ED for evaluation of altered mental status, unresponsive episode. PMHx: chronic atrial fibrillation not on AC due to Hx hemorrhagic stroke 2012, history of Rt ACA embolic CVA 01/2021, polycythemia vera, HTN, HLD, vascular dementia   OT comments  Pt is making fair progress towards their acute OT goals. Pt remains limited by baseline impiared cognition, weakness and unsteady gait/ Overall he needed cues to initiate and sequence through all functional tasks. Pt has extremely poo RW management with several LOBs and running into things. He required min A for mobility. OT to continue to follow acutely to facilitate progress towards established goals. Pt will continue to benefit from [facility] at ALF.       If plan is discharge home, recommend the following:  Supervision due to cognitive status;Assistance with cooking/housework;Direct supervision/assist for medications management;Direct supervision/assist for financial management;Assist for transportation   Equipment Recommendations  None recommended by OT       Precautions / Restrictions Precautions Precautions: Fall Restrictions Weight Bearing Restrictions: No       Mobility Bed Mobility Overal bed mobility: Needs Assistance Bed Mobility: Supine to Sit, Sit to Supine     Supine to sit: Min assist Sit to supine: Contact guard assist        Transfers Overall transfer level: Needs assistance Equipment used: Rolling walker (2 wheels) Transfers: Sit to/from Stand Sit to Stand: Min assist                 Balance Overall balance assessment: Needs assistance Sitting-balance support: Feet supported Sitting balance-Leahy Scale: Good     Standing balance support: Single extremity supported, During functional activity Standing  balance-Leahy Scale: Fair                             ADL either performed or assessed with clinical judgement   ADL Overall ADL's : Needs assistance/impaired     Grooming: Contact guard assist;Standing                   Toilet Transfer: Minimal assistance;Ambulation;Rolling walker (2 wheels);Regular Toilet           Functional mobility during ADLs: Minimal assistance;Rolling walker (2 wheels);Cueing for safety General ADL Comments: several LOBs during turns this date. cues needed for all functional tasks    Extremity/Trunk Assessment Upper Extremity Assessment Upper Extremity Assessment: Generalized weakness   Lower Extremity Assessment Lower Extremity Assessment: Defer to PT evaluation        Vision   Vision Assessment?: No apparent visual deficits   Perception Perception Perception: Not tested   Praxis Praxis Praxis: Not tested    Cognition Arousal: Alert Behavior During Therapy: WFL for tasks assessed/performed Overall Cognitive Status: History of cognitive impairments - at baseline             General Comments: follows most simple commands with increased time for processing. Poor insight and safety, pt bumping into many things during mobiliyt, and unabel to self-correct.              General Comments VSS on RA, wife present    Pertinent Vitals/ Pain       Pain Assessment Pain Assessment: Faces Pain Intervention(s): Monitored during session         Frequency  Min  1X/week        Progress Toward Goals  OT Goals(current goals can now be found in the care plan section)  Progress towards OT goals: Progressing toward goals  Acute Rehab OT Goals Patient Stated Goal: to go home OT Goal Formulation: With patient Time For Goal Achievement: 10/24/22 Potential to Achieve Goals: Good ADL Goals Pt Will Perform Grooming: with modified independence;standing Pt Will Perform Lower Body Dressing: with modified independence;sit  to/from stand Pt Will Transfer to Toilet: with modified independence;ambulating Pt Will Perform Toileting - Clothing Manipulation and hygiene: with modified independence Additional ADL Goal #1: Pt will navigate hospital environment with mod I and SPC to demonstrated reduced risk for falls at discharge   AM-PAC OT "6 Clicks" Daily Activity     Outcome Measure   Help from another person eating meals?: A Little Help from another person taking care of personal grooming?: A Little Help from another person toileting, which includes using toliet, bedpan, or urinal?: A Little Help from another person bathing (including washing, rinsing, drying)?: A Little Help from another person to put on and taking off regular upper body clothing?: A Little Help from another person to put on and taking off regular lower body clothing?: A Little 6 Click Score: 18    End of Session Equipment Utilized During Treatment: Gait belt  OT Visit Diagnosis: Unsteadiness on feet (R26.81);Other abnormalities of gait and mobility (R26.89);Muscle weakness (generalized) (M62.81)   Activity Tolerance Patient tolerated treatment well   Patient Left in bed;with call bell/phone within reach;with bed alarm set;with family/visitor present   Nurse Communication Mobility status        Time: 1426-1450 OT Time Calculation (min): 24 min  Charges: OT General Charges $OT Visit: 1 Visit OT Treatments $Self Care/Home Management : 8-22 mins $Therapeutic Activity: 8-22 mins  Derenda Mis, OTR/L Acute Rehabilitation Services Office [phone] Secure Chat Communication Preferred   Donia Pounds 10/12/2022, 3:14 PM

## 2022-10-12 NOTE — TOC Progression Note (Addendum)
Transition of Care [facility]) - Progression Note    Patient Details  Name: Dean Pratt MRN: [id number] Date of Birth: 12-26-1935  [facility]) CM/SW Contact  Mearl Latin, LCSW Phone Number: 10/12/2022, 1:41 PM  Clinical Narrative:    CSW contacted [facility] to ask if patient is discharging to their ALF side or [place]. They were under the impression he was going to their SNF side but CSW explained the recommendation is for home health therapy and not SNF (walked 380 ft, 18 Click score). Belenda Cruise stated that the AL room will not be available until tomorrow and they do not feel that patient will be safe returning to [place] apartment. CSW explained that discharge order has been in since yesterday so patient is at risk for a bill. Facility reported understanding.   CSW contacted patient's daughter and HCPOA. She reported understanding and stated family would be able to transport him by car. CSW will continue to follow.     CSW received request to speak with patient's wife at bedside. She reported being upset that they have to pay observation level of care because [facility] is not ready for them. She also stated being upset by the fact that "daughter who never sees him" apparently made the decision to move into ALF without consulting her. CSW reassured her that the facility is trying to assess his needs and what is safe for him. No further questions at this time.    Expected Discharge Plan: Assisted Living Barriers to Discharge: Unsafe home situation (ALF bed not ready until tomorrow)  Expected Discharge Plan and Services In-house Referral: Clinical Social Work     Living arrangements for the past 2 months: Independent Living Facility Expected Discharge Date: 10/11/22                                     Social Determinants of Health (SDOH) Interventions SDOH Screenings   Tobacco Use: Low Risk  (10/09/2022)    Readmission Risk Interventions     No  data to display

## 2022-10-12 NOTE — Progress Notes (Signed)
[facility]                                                                                                                                                                         Patient Demographics  Dean Pratt, is a 87 y.o. male  QQV:[id number]  PPI:[id number]  DOB - 1935/07/31  Admit date - 10/09/2022  Admitting Physician Charlsie Quest, MD  Outpatient Primary MD for the patient is [name], MD  LOS - 0   Chief Complaint  Patient presents with   Altered Mental Status        Assessment & Plan    Patient seen briefly today due for discharge soon per Discharge done yesterday by me, no further issues, Vital signs stable, patient feels fine.  Await bed at SNF.    Medications  Scheduled Meds:   stroke: early stages of recovery book   Does not apply Once   allopurinol  100 mg Oral QHS   vitamin C  1,000 mg Oral Daily   aspirin EC  81 mg Oral Daily   cyanocobalamin  1,000 mcg Oral Daily   diltiazem  180 mg Oral Daily   docusate sodium  200 mg Oral BID   donepezil  10 mg Oral QHS   enoxaparin (LOVENOX) injection  40 mg Subcutaneous Q24H   hydroxyurea  500 mg Oral Daily   memantine  10 mg Oral BID   olopatadine  1 drop Both Eyes BID   pantoprazole  40 mg Oral Daily   polyethylene glycol  17 g Oral BID   rosuvastatin  10 mg Oral Daily   Continuous Infusions: PRN Meds:.acetaminophen **OR** acetaminophen (TYLENOL) oral liquid 160 mg/5 mL **OR** acetaminophen, cycloSPORINE, LORazepam, methocarbamol, ondansetron (ZOFRAN) IV, senna-docusate    Time Spent in minutes   10 minutes   Susa Raring M.D on 10/12/2022 at 10:00 AM  Between 7am to 7pm - Pager - [phone]  After 7pm go to [email or web address] - password [email or web address]  And look for the night coverage person covering for me after hours  [facility] Office  [phone]    Subjective:   Dean Pratt today has, No headache, No  chest pain, No abdominal pain - No Nausea, No new weakness tingling or numbness, No Cough - SOB.   Objective:   Vitals:   10/11/22 0600 10/11/22 0800 10/11/22 2230 10/12/22 0800  BP: 123/81 126/62 (!) 158/101 (!) 145/84  Pulse:  71 77 71  Resp:  20  18  Temp: 98.3 F (36.8 C) 98 F (36.7 C) 98.1 F (36.7 C) 97.6 F (36.4 C)  TempSrc: Oral Oral Oral Oral  SpO2:   95% 93%  Weight:      Height:  Wt Readings from Last 3 Encounters:  10/09/22 73.5 kg  09/29/22 73.5 kg  09/28/22 73.8 kg     Intake/Output Summary (Last 24 hours) at 10/12/2022 1000 Last data filed at 10/12/2022 0759 Gross per 24 hour  Intake --  Output 550 ml  Net -550 ml    Exam  Awake Alert, No new F.N deficits, Normal affect [place].AT,PERRAL Supple Neck, No JVD,   Symmetrical Chest wall movement, Good air movement bilaterally, CTAB RRR,No Gallops, Rubs or new Murmurs,  +ve B.Sounds, Abd Soft, No tenderness,   No Cyanosis, Clubbing or edema   Data Review

## 2022-10-13 DIAGNOSIS — G9340 Encephalopathy, unspecified: Secondary | ICD-10-CM | POA: Diagnosis not present

## 2022-10-13 MED ORDER — HYDRALAZINE HCL 50 MG PO TABS: 50.0000 mg | ORAL_TABLET | Freq: Three times a day (TID) | ORAL | Status: DC

## 2022-10-13 MED ORDER — HYDRALAZINE HCL 50 MG PO TABS
50.0000 mg | ORAL_TABLET | Freq: Three times a day (TID) | ORAL | Status: DC
  Administered 2022-10-13: 50 mg via ORAL
  Filled 2022-10-13: qty 1

## 2022-10-13 NOTE — Progress Notes (Signed)
[facility]                                                                                                                                                                         Patient Demographics  Dean Pratt, is a 87 y.o. male  [id number]:[id number]  [id number]  DOB - January 27, 1935  Admit date - 10/09/2022  Admitting Physician Charlsie Quest, MD  Outpatient Primary MD for the patient is [name], MD  LOS - 0   Chief Complaint  Patient presents with   Altered Mental Status        Assessment & Plan    Patient seen briefly today due for discharge soon per Discharge done by me on 10/11/2022, blood pressure medications adjusted further on 10/13/2022 for better control, patient feels fine.  Await bed at SNF.    Medications  Scheduled Meds:   stroke: early stages of recovery book   Does not apply Once   allopurinol  100 mg Oral QHS   vitamin C  1,000 mg Oral Daily   aspirin EC  81 mg Oral Daily   cyanocobalamin  1,000 mcg Oral Daily   diltiazem  180 mg Oral Daily   docusate sodium  200 mg Oral BID   donepezil  10 mg Oral QHS   enoxaparin (LOVENOX) injection  40 mg Subcutaneous Q24H   hydrALAZINE  50 mg Oral Q8H   hydroxyurea  500 mg Oral Daily   memantine  10 mg Oral BID   olopatadine  1 drop Both Eyes BID   pantoprazole  40 mg Oral Daily   polyethylene glycol  17 g Oral BID   rosuvastatin  10 mg Oral Daily   Continuous Infusions: PRN Meds:.acetaminophen **OR** acetaminophen (TYLENOL) oral liquid 160 mg/5 mL **OR** acetaminophen, cycloSPORINE, LORazepam, methocarbamol, ondansetron (ZOFRAN) IV, senna-docusate    Time Spent in minutes   10 minutes   Susa Raring M.D on 10/13/2022 at 8:48 AM  Between 7am to 7pm - Pager - [phone]  After 7pm go to [email or web address] - password [email or web address]  And look for the night coverage person covering for me after hours  [facility] Office   [phone]    Subjective:   Dean Pratt today has, No headache, No chest pain, No abdominal pain - No Nausea, No new weakness tingling or numbness, No Cough - SOB.   Objective:   Vitals:   10/12/22 0800 10/12/22 1150 10/12/22 1637 10/13/22 0820  BP: (!) 145/84 136/86 (!) 143/80 (!) 160/110  Pulse: 71 69 67   Resp: 18 20 18    Temp: 97.6 F (36.4 C) 97.6 F (36.4 C) 97.6 F (36.4 C)   TempSrc: Oral Oral Axillary   SpO2: 93% 95% 94%  Weight:      Height:        Wt Readings from Last 3 Encounters:  10/09/22 73.5 kg  09/29/22 73.5 kg  09/28/22 73.8 kg     Intake/Output Summary (Last 24 hours) at 10/13/2022 0848 Last data filed at 10/13/2022 0600 Gross per 24 hour  Intake --  Output 300 ml  Net -300 ml    Exam  Awake Alert, No new F.N deficits, Normal affect [place].AT,PERRAL Supple Neck, No JVD,   Symmetrical Chest wall movement, Good air movement bilaterally, CTAB RRR,No Gallops, Rubs or new Murmurs,  +ve B.Sounds, Abd Soft, No tenderness,   No Cyanosis, Clubbing or edema   Data Review

## 2022-10-13 NOTE — NC FL2 (Signed)
[place] MEDICAID FL2 LEVEL OF CARE FORM     IDENTIFICATION  Patient Name: Dean Pratt Birthdate: 05/08/1935 Sex: male Admission Date (Current Location): 10/09/2022  [facility] and [place] Number:  Producer, television/film/video and Address:  [facility] [place], [address], [place], [place] [address]      Provider Number: [id number]  Attending Physician Name and Address:  Leroy Sea, MD  Relative Name and Phone Number:       Current Level of Care: Hospital Recommended Level of Care: Assisted Living Facility Prior Approval Number:    Date Approved/Denied:   PASRR Number:    Discharge Plan: Other (Comment) (ALF)    Current Diagnoses: Patient Active Problem List   Diagnosis Date Noted   Acute encephalopathy 10/09/2022   AKI (acute kidney injury) (HCC) 10/09/2022   Vascular dementia (HCC) 10/09/2022   Debility 01/12/2021   Cerebral embolism with cerebral infarction 01/10/2021   CVA (cerebral vascular accident) (HCC) 01/10/2021   Syncope and collapse 01/09/2021   Subarachnoid hemorrhage following injury with brief loss of consciousness but without open intracranial wound (HCC) 01/09/2021   Maxillary sinus fracture, closed, initial encounter (HCC) 01/09/2021   COPD (chronic obstructive pulmonary disease) (HCC) 01/09/2021   Thoracic ascending aortic aneurysm (HCC) 01/09/2021   Pulmonary nodules/lesions, multiple 01/09/2021   Thyroid nodule greater than or equal to 1 cm in diameter incidentally noted on imaging study 01/09/2021   Dementia without behavioral disturbance (HCC) 01/09/2021   Paraspinal soft tissue mass 12/15/2020   Scoliosis of thoracolumbar spine 07/25/2019   Spondylosis without myelopathy or radiculopathy, lumbar region 07/25/2019   Status post ablation of atrial fibrillation 12/24/2017   Polycythemia vera (HCC) 04/13/2017   OTHER AND UNSPECIFIED COAGULATION DEFECTS 01/02/2010   Chronic atrial fibrillation (HCC) 01/02/2010   SNORING  02/27/2009   ISCHEMIC COLITIS 03/27/2008   ABDOMINAL PAIN-RLQ 03/22/2008   ABNORMAL FINDINGS GI TRACT 03/22/2008   History of colonic polyps 03/22/2008   COLONIC POLYPS 03/21/2008   Hyperlipidemia 03/21/2008   GOUT 03/21/2008   ANXIETY 03/21/2008   MIGRAINE HEADACHE 03/21/2008   CONSTIPATION, CHRONIC 03/21/2008   ROSACEA 03/21/2008   Essential hypertension 03/21/2008   CARCINOMA, BASAL CELL, HX OF 03/21/2008   TUBERCULOSIS, HX OF 03/21/2008   ATRIAL FIBRILLATION, HX OF 03/21/2008   BPH (benign prostatic hyperplasia) 03/21/2008    Orientation RESPIRATION BLADDER Height & Weight     Self, Place  Normal Incontinent Weight: 162 lb 0.6 oz (73.5 kg) Height:  6\' 1"  (185.4 cm)  BEHAVIORAL SYMPTOMS/MOOD NEUROLOGICAL BOWEL NUTRITION STATUS      Incontinent Diet (Soft)  AMBULATORY STATUS COMMUNICATION OF NEEDS Skin   Limited Assist Verbally Normal                       Personal Care Assistance Level of Assistance  Bathing, Feeding, Dressing Bathing Assistance: Limited assistance Feeding assistance: Limited assistance Dressing Assistance: Limited assistance     Functional Limitations Info             SPECIAL CARE FACTORS FREQUENCY  PT (By licensed PT), OT (By licensed OT)     PT Frequency: Home Health PT OT Frequency: Home Health OT            Contractures Contractures Info: Not present    Additional Factors Info  Code Status, Allergies Code Status Info: Full Allergies Info: Warfarin And Related, Ace Inhibitors           Current Medications (10/13/2022):  Discharge Medications: STOP taking these medications     famotidine 20 MG tablet Commonly known as: PEPCID    losartan 25 MG tablet Commonly known as: COZAAR           TAKE these medications     acetaminophen 325 MG tablet Commonly known as: TYLENOL Take 650 mg by mouth every 6 (six) hours as needed for mild pain.    allopurinol 100 MG tablet Commonly known as: ZYLOPRIM Take 100 mg by  mouth at bedtime.    aspirin EC 81 MG tablet Take 1 tablet (81 mg total) by mouth daily. Swallow whole.    cholecalciferol 25 MCG (1000 UNIT) tablet Commonly known as: VITAMIN D3 Take 1,000 Units by mouth daily.    cyanocobalamin 1000 MCG tablet Commonly known as: VITAMIN B12 Take 1,000 mcg by mouth daily.    diltiazem 180 MG 24 hr capsule Commonly known as: CARDIZEM CD Take 1 capsule (180 mg total) by mouth daily. What changed:  medication strength See the new instructions.    donepezil 10 MG tablet Commonly known as: ARICEPT TAKE ONE TABLET BY MOUTH EVERYDAY AT BEDTIME What changed: See the new instructions.    escitalopram 10 MG tablet Commonly known as: LEXAPRO Take 10 mg by mouth daily.    hydrALAZINE 50 MG tablet Commonly known as: APRESOLINE Take 1 tablet (50 mg total) by mouth every 8 (eight) hours.    hydroxyurea 500 MG capsule Commonly known as: HYDREA TAKE ONE CAPSULE BY MOUTH AT NOON    memantine 10 MG tablet Commonly known as: NAMENDA TAKE ONE TABLET BY MOUTH AT NOON and TAKE ONE TABLET BY MOUTH EVERYDAY AT BEDTIME What changed: See the new instructions.    OVER THE COUNTER MEDICATION Place 1 spray into both nostrils daily as needed (congestion). Congestion nasal spray    pantoprazole 40 MG tablet Commonly known as: PROTONIX Take 1 tablet (40 mg total) by mouth daily.    rosuvastatin 10 MG tablet Commonly known as: CRESTOR Take 10 mg by mouth daily.    vitamin C 1000 MG tablet Take 1,000 mg by mouth daily.    Relevant Imaging Results:  Relevant Lab Results:   Additional Information SS#: [id number]  Mearl Latin, LCSW

## 2022-10-13 NOTE — TOC Transition Note (Signed)
Transition of Care [facility]) - CM/SW Discharge Note   Patient Details  Name: Dean Pratt MRN: [id number] Date of Birth: November 11, 1935  [facility] CM/SW Contact:  Mearl Latin, LCSW Phone Number: 10/13/2022, 3:10 PM   Clinical Narrative:    Patient will DC to: [facility] ALF Anticipated DC date: 10/13/22 Family notified: Spouse Transport by: Spouse by car   Per MD patient ready for DC to ALF. RN to call report prior to discharge [phone] room 2). RN, patient, patient's family, and facility notified of DC. Discharge Summary sent to facility as requested.   CSW will sign off for now as social work intervention is no longer needed. Please consult [place] again if new needs arise.     Final next level of care: Assisted Living Barriers to Discharge: Barriers Resolved   Patient Goals and CMS Choice CMS Medicare.gov Compare Post Acute Care list provided to:: Patient Represenative (must comment) Choice offered to / list presented to : Spouse  Discharge Placement                  Patient to be transferred to facility by: car Name of family member notified: spouse and daughter Patient and family notified of of transfer: 10/13/22  Discharge Plan and Services Additional resources added to the After Visit Summary for   In-house Referral: Clinical Social Work                                   Social Determinants of Health (SDOH) Interventions SDOH Screenings   Tobacco Use: Low Risk  (10/09/2022)     Readmission Risk Interventions     No data to display

## 2022-10-14 DIAGNOSIS — M6281 Muscle weakness (generalized): Secondary | ICD-10-CM | POA: Diagnosis not present

## 2022-10-14 DIAGNOSIS — E785 Hyperlipidemia, unspecified: Secondary | ICD-10-CM | POA: Diagnosis not present

## 2022-10-14 DIAGNOSIS — K5900 Constipation, unspecified: Secondary | ICD-10-CM | POA: Diagnosis not present

## 2022-10-14 DIAGNOSIS — F0150 Vascular dementia without behavioral disturbance: Secondary | ICD-10-CM | POA: Diagnosis not present

## 2022-10-14 DIAGNOSIS — K219 Gastro-esophageal reflux disease without esophagitis: Secondary | ICD-10-CM | POA: Diagnosis not present

## 2022-10-14 DIAGNOSIS — I482 Chronic atrial fibrillation, unspecified: Secondary | ICD-10-CM | POA: Diagnosis not present

## 2022-10-14 DIAGNOSIS — I10 Essential (primary) hypertension: Secondary | ICD-10-CM | POA: Diagnosis not present

## 2022-10-14 DIAGNOSIS — R0602 Shortness of breath: Secondary | ICD-10-CM | POA: Diagnosis not present

## 2022-10-14 DIAGNOSIS — E559 Vitamin D deficiency, unspecified: Secondary | ICD-10-CM | POA: Diagnosis not present

## 2022-10-14 DIAGNOSIS — R627 Adult failure to thrive: Secondary | ICD-10-CM | POA: Diagnosis not present

## 2022-10-14 DIAGNOSIS — M109 Gout, unspecified: Secondary | ICD-10-CM | POA: Diagnosis not present

## 2022-10-14 DIAGNOSIS — E538 Deficiency of other specified B group vitamins: Secondary | ICD-10-CM | POA: Diagnosis not present

## 2022-10-14 DIAGNOSIS — N179 Acute kidney failure, unspecified: Secondary | ICD-10-CM | POA: Diagnosis not present

## 2022-10-14 DIAGNOSIS — D45 Polycythemia vera: Secondary | ICD-10-CM | POA: Diagnosis not present

## 2022-10-15 DIAGNOSIS — R0602 Shortness of breath: Secondary | ICD-10-CM | POA: Diagnosis not present

## 2022-10-19 DIAGNOSIS — M109 Gout, unspecified: Secondary | ICD-10-CM | POA: Diagnosis not present

## 2022-10-19 DIAGNOSIS — R627 Adult failure to thrive: Secondary | ICD-10-CM | POA: Diagnosis not present

## 2022-10-19 DIAGNOSIS — R251 Tremor, unspecified: Secondary | ICD-10-CM | POA: Diagnosis not present

## 2022-10-19 DIAGNOSIS — R5381 Other malaise: Secondary | ICD-10-CM | POA: Diagnosis not present

## 2022-10-19 DIAGNOSIS — I482 Chronic atrial fibrillation, unspecified: Secondary | ICD-10-CM | POA: Diagnosis not present

## 2022-10-19 DIAGNOSIS — K219 Gastro-esophageal reflux disease without esophagitis: Secondary | ICD-10-CM | POA: Diagnosis not present

## 2022-10-19 DIAGNOSIS — K5900 Constipation, unspecified: Secondary | ICD-10-CM | POA: Diagnosis not present

## 2022-10-19 DIAGNOSIS — J309 Allergic rhinitis, unspecified: Secondary | ICD-10-CM | POA: Diagnosis not present

## 2022-10-19 DIAGNOSIS — E785 Hyperlipidemia, unspecified: Secondary | ICD-10-CM | POA: Diagnosis not present

## 2022-10-19 DIAGNOSIS — I10 Essential (primary) hypertension: Secondary | ICD-10-CM | POA: Diagnosis not present

## 2022-10-20 DIAGNOSIS — R0602 Shortness of breath: Secondary | ICD-10-CM | POA: Diagnosis not present

## 2022-10-22 DIAGNOSIS — R627 Adult failure to thrive: Secondary | ICD-10-CM | POA: Diagnosis not present

## 2022-10-22 DIAGNOSIS — F1320 Sedative, hypnotic or anxiolytic dependence, uncomplicated: Secondary | ICD-10-CM | POA: Diagnosis not present

## 2022-10-22 DIAGNOSIS — I6930 Unspecified sequelae of cerebral infarction: Secondary | ICD-10-CM | POA: Diagnosis not present

## 2022-10-22 DIAGNOSIS — F419 Anxiety disorder, unspecified: Secondary | ICD-10-CM | POA: Diagnosis not present

## 2022-10-22 DIAGNOSIS — J0120 Acute ethmoidal sinusitis, unspecified: Secondary | ICD-10-CM | POA: Diagnosis not present

## 2022-10-22 DIAGNOSIS — F03A0 Unspecified dementia, mild, without behavioral disturbance, psychotic disturbance, mood disturbance, and anxiety: Secondary | ICD-10-CM | POA: Diagnosis not present

## 2022-10-22 DIAGNOSIS — Z8679 Personal history of other diseases of the circulatory system: Secondary | ICD-10-CM | POA: Diagnosis not present

## 2022-10-22 DIAGNOSIS — I480 Paroxysmal atrial fibrillation: Secondary | ICD-10-CM | POA: Diagnosis not present

## 2022-10-22 DIAGNOSIS — R410 Disorientation, unspecified: Secondary | ICD-10-CM | POA: Diagnosis not present

## 2022-10-22 DIAGNOSIS — H53462 Homonymous bilateral field defects, left side: Secondary | ICD-10-CM | POA: Diagnosis not present

## 2022-10-22 DIAGNOSIS — R531 Weakness: Secondary | ICD-10-CM | POA: Diagnosis not present

## 2022-10-22 DIAGNOSIS — G319 Degenerative disease of nervous system, unspecified: Secondary | ICD-10-CM | POA: Diagnosis not present

## 2022-11-08 DIAGNOSIS — R41841 Cognitive communication deficit: Secondary | ICD-10-CM | POA: Diagnosis not present

## 2022-11-08 DIAGNOSIS — K219 Gastro-esophageal reflux disease without esophagitis: Secondary | ICD-10-CM | POA: Diagnosis not present

## 2022-11-08 DIAGNOSIS — F0150 Vascular dementia without behavioral disturbance: Secondary | ICD-10-CM | POA: Diagnosis not present

## 2022-11-11 DIAGNOSIS — R627 Adult failure to thrive: Secondary | ICD-10-CM | POA: Diagnosis not present

## 2022-11-11 DIAGNOSIS — K5900 Constipation, unspecified: Secondary | ICD-10-CM | POA: Diagnosis not present

## 2022-11-11 DIAGNOSIS — E785 Hyperlipidemia, unspecified: Secondary | ICD-10-CM | POA: Diagnosis not present

## 2022-11-11 DIAGNOSIS — E559 Vitamin D deficiency, unspecified: Secondary | ICD-10-CM | POA: Diagnosis not present

## 2022-11-11 DIAGNOSIS — M109 Gout, unspecified: Secondary | ICD-10-CM | POA: Diagnosis not present

## 2022-11-11 DIAGNOSIS — I482 Chronic atrial fibrillation, unspecified: Secondary | ICD-10-CM | POA: Diagnosis not present

## 2022-11-11 DIAGNOSIS — F0150 Vascular dementia without behavioral disturbance: Secondary | ICD-10-CM | POA: Diagnosis not present

## 2022-11-12 DIAGNOSIS — F0150 Vascular dementia without behavioral disturbance: Secondary | ICD-10-CM | POA: Diagnosis not present

## 2022-11-12 DIAGNOSIS — I10 Essential (primary) hypertension: Secondary | ICD-10-CM | POA: Diagnosis not present

## 2022-11-12 DIAGNOSIS — E559 Vitamin D deficiency, unspecified: Secondary | ICD-10-CM | POA: Diagnosis not present

## 2022-11-12 DIAGNOSIS — K219 Gastro-esophageal reflux disease without esophagitis: Secondary | ICD-10-CM | POA: Diagnosis not present

## 2022-11-12 DIAGNOSIS — M109 Gout, unspecified: Secondary | ICD-10-CM | POA: Diagnosis not present

## 2022-11-12 DIAGNOSIS — I482 Chronic atrial fibrillation, unspecified: Secondary | ICD-10-CM | POA: Diagnosis not present

## 2022-11-12 DIAGNOSIS — K5900 Constipation, unspecified: Secondary | ICD-10-CM | POA: Diagnosis not present

## 2022-11-12 DIAGNOSIS — R627 Adult failure to thrive: Secondary | ICD-10-CM | POA: Diagnosis not present

## 2022-11-12 DIAGNOSIS — R5381 Other malaise: Secondary | ICD-10-CM | POA: Diagnosis not present

## 2022-11-16 DIAGNOSIS — R627 Adult failure to thrive: Secondary | ICD-10-CM | POA: Diagnosis not present

## 2022-11-16 DIAGNOSIS — F0150 Vascular dementia without behavioral disturbance: Secondary | ICD-10-CM | POA: Diagnosis not present

## 2022-11-16 DIAGNOSIS — I10 Essential (primary) hypertension: Secondary | ICD-10-CM | POA: Diagnosis not present

## 2022-11-16 DIAGNOSIS — M109 Gout, unspecified: Secondary | ICD-10-CM | POA: Diagnosis not present

## 2022-11-16 DIAGNOSIS — K219 Gastro-esophageal reflux disease without esophagitis: Secondary | ICD-10-CM | POA: Diagnosis not present

## 2022-11-16 DIAGNOSIS — K5900 Constipation, unspecified: Secondary | ICD-10-CM | POA: Diagnosis not present

## 2022-11-16 DIAGNOSIS — M79671 Pain in right foot: Secondary | ICD-10-CM | POA: Diagnosis not present

## 2022-11-16 DIAGNOSIS — M25562 Pain in left knee: Secondary | ICD-10-CM | POA: Diagnosis not present

## 2022-11-16 DIAGNOSIS — E559 Vitamin D deficiency, unspecified: Secondary | ICD-10-CM | POA: Diagnosis not present

## 2022-11-16 DIAGNOSIS — R5381 Other malaise: Secondary | ICD-10-CM | POA: Diagnosis not present

## 2022-11-16 DIAGNOSIS — I482 Chronic atrial fibrillation, unspecified: Secondary | ICD-10-CM | POA: Diagnosis not present

## 2022-11-17 DIAGNOSIS — L719 Rosacea, unspecified: Secondary | ICD-10-CM | POA: Diagnosis not present

## 2022-11-17 DIAGNOSIS — Z961 Presence of intraocular lens: Secondary | ICD-10-CM | POA: Diagnosis not present

## 2022-11-17 DIAGNOSIS — H33011 Retinal detachment with single break, right eye: Secondary | ICD-10-CM | POA: Diagnosis not present

## 2022-11-17 DIAGNOSIS — H1045 Other chronic allergic conjunctivitis: Secondary | ICD-10-CM | POA: Diagnosis not present

## 2022-11-17 DIAGNOSIS — H04123 Dry eye syndrome of bilateral lacrimal glands: Secondary | ICD-10-CM | POA: Diagnosis not present

## 2022-12-05 DIAGNOSIS — I10 Essential (primary) hypertension: Secondary | ICD-10-CM | POA: Diagnosis not present

## 2022-12-05 DIAGNOSIS — I482 Chronic atrial fibrillation, unspecified: Secondary | ICD-10-CM | POA: Diagnosis not present

## 2022-12-07 DIAGNOSIS — F0150 Vascular dementia without behavioral disturbance: Secondary | ICD-10-CM | POA: Diagnosis not present

## 2022-12-07 DIAGNOSIS — E785 Hyperlipidemia, unspecified: Secondary | ICD-10-CM | POA: Diagnosis not present

## 2022-12-07 DIAGNOSIS — R600 Localized edema: Secondary | ICD-10-CM | POA: Diagnosis not present

## 2022-12-07 DIAGNOSIS — L853 Xerosis cutis: Secondary | ICD-10-CM | POA: Diagnosis not present

## 2022-12-07 DIAGNOSIS — M109 Gout, unspecified: Secondary | ICD-10-CM | POA: Diagnosis not present

## 2022-12-07 DIAGNOSIS — R627 Adult failure to thrive: Secondary | ICD-10-CM | POA: Diagnosis not present

## 2022-12-07 DIAGNOSIS — D45 Polycythemia vera: Secondary | ICD-10-CM | POA: Diagnosis not present

## 2022-12-07 DIAGNOSIS — K219 Gastro-esophageal reflux disease without esophagitis: Secondary | ICD-10-CM | POA: Diagnosis not present

## 2022-12-07 DIAGNOSIS — K5900 Constipation, unspecified: Secondary | ICD-10-CM | POA: Diagnosis not present

## 2022-12-07 DIAGNOSIS — I10 Essential (primary) hypertension: Secondary | ICD-10-CM | POA: Diagnosis not present

## 2022-12-07 DIAGNOSIS — I482 Chronic atrial fibrillation, unspecified: Secondary | ICD-10-CM | POA: Diagnosis not present

## 2022-12-07 DIAGNOSIS — E559 Vitamin D deficiency, unspecified: Secondary | ICD-10-CM | POA: Diagnosis not present

## 2022-12-08 DIAGNOSIS — M25571 Pain in right ankle and joints of right foot: Secondary | ICD-10-CM | POA: Diagnosis not present

## 2022-12-08 DIAGNOSIS — R601 Generalized edema: Secondary | ICD-10-CM | POA: Diagnosis not present

## 2022-12-08 DIAGNOSIS — M79671 Pain in right foot: Secondary | ICD-10-CM | POA: Diagnosis not present

## 2022-12-09 DIAGNOSIS — E559 Vitamin D deficiency, unspecified: Secondary | ICD-10-CM | POA: Diagnosis not present

## 2022-12-09 DIAGNOSIS — E785 Hyperlipidemia, unspecified: Secondary | ICD-10-CM | POA: Diagnosis not present

## 2022-12-09 DIAGNOSIS — S92354D Nondisplaced fracture of fifth metatarsal bone, right foot, subsequent encounter for fracture with routine healing: Secondary | ICD-10-CM | POA: Diagnosis not present

## 2022-12-09 DIAGNOSIS — R5381 Other malaise: Secondary | ICD-10-CM | POA: Diagnosis not present

## 2022-12-09 DIAGNOSIS — F0150 Vascular dementia without behavioral disturbance: Secondary | ICD-10-CM | POA: Diagnosis not present

## 2022-12-09 DIAGNOSIS — M109 Gout, unspecified: Secondary | ICD-10-CM | POA: Diagnosis not present

## 2022-12-09 DIAGNOSIS — K219 Gastro-esophageal reflux disease without esophagitis: Secondary | ICD-10-CM | POA: Diagnosis not present

## 2022-12-09 DIAGNOSIS — D45 Polycythemia vera: Secondary | ICD-10-CM | POA: Diagnosis not present

## 2022-12-09 DIAGNOSIS — I10 Essential (primary) hypertension: Secondary | ICD-10-CM | POA: Diagnosis not present

## 2022-12-09 DIAGNOSIS — I482 Chronic atrial fibrillation, unspecified: Secondary | ICD-10-CM | POA: Diagnosis not present

## 2022-12-09 DIAGNOSIS — R627 Adult failure to thrive: Secondary | ICD-10-CM | POA: Diagnosis not present

## 2022-12-09 DIAGNOSIS — K5900 Constipation, unspecified: Secondary | ICD-10-CM | POA: Diagnosis not present

## 2022-12-21 DIAGNOSIS — F01B18 Vascular dementia, moderate, with other behavioral disturbance: Secondary | ICD-10-CM | POA: Diagnosis not present

## 2022-12-21 DIAGNOSIS — F411 Generalized anxiety disorder: Secondary | ICD-10-CM | POA: Diagnosis not present

## 2022-12-23 DIAGNOSIS — E785 Hyperlipidemia, unspecified: Secondary | ICD-10-CM | POA: Diagnosis not present

## 2022-12-23 DIAGNOSIS — M109 Gout, unspecified: Secondary | ICD-10-CM | POA: Diagnosis not present

## 2022-12-23 DIAGNOSIS — I482 Chronic atrial fibrillation, unspecified: Secondary | ICD-10-CM | POA: Diagnosis not present

## 2022-12-23 DIAGNOSIS — K5900 Constipation, unspecified: Secondary | ICD-10-CM | POA: Diagnosis not present

## 2022-12-23 DIAGNOSIS — R5381 Other malaise: Secondary | ICD-10-CM | POA: Diagnosis not present

## 2022-12-23 DIAGNOSIS — D45 Polycythemia vera: Secondary | ICD-10-CM | POA: Diagnosis not present

## 2022-12-23 DIAGNOSIS — L853 Xerosis cutis: Secondary | ICD-10-CM | POA: Diagnosis not present

## 2022-12-23 DIAGNOSIS — J309 Allergic rhinitis, unspecified: Secondary | ICD-10-CM | POA: Diagnosis not present

## 2022-12-23 DIAGNOSIS — L918 Other hypertrophic disorders of the skin: Secondary | ICD-10-CM | POA: Diagnosis not present

## 2022-12-23 DIAGNOSIS — K219 Gastro-esophageal reflux disease without esophagitis: Secondary | ICD-10-CM | POA: Diagnosis not present

## 2022-12-23 DIAGNOSIS — R059 Cough, unspecified: Secondary | ICD-10-CM | POA: Diagnosis not present

## 2022-12-23 DIAGNOSIS — S92354D Nondisplaced fracture of fifth metatarsal bone, right foot, subsequent encounter for fracture with routine healing: Secondary | ICD-10-CM | POA: Diagnosis not present

## 2022-12-24 DIAGNOSIS — B974 Respiratory syncytial virus as the cause of diseases classified elsewhere: Secondary | ICD-10-CM | POA: Diagnosis not present

## 2022-12-24 DIAGNOSIS — J101 Influenza due to other identified influenza virus with other respiratory manifestations: Secondary | ICD-10-CM | POA: Diagnosis not present

## 2023-01-02 DIAGNOSIS — I482 Chronic atrial fibrillation, unspecified: Secondary | ICD-10-CM | POA: Diagnosis not present

## 2023-01-02 DIAGNOSIS — I10 Essential (primary) hypertension: Secondary | ICD-10-CM | POA: Diagnosis not present

## 2023-01-04 DIAGNOSIS — F01B18 Vascular dementia, moderate, with other behavioral disturbance: Secondary | ICD-10-CM | POA: Diagnosis not present

## 2023-01-04 DIAGNOSIS — F411 Generalized anxiety disorder: Secondary | ICD-10-CM | POA: Diagnosis not present

## 2023-01-06 DIAGNOSIS — F0150 Vascular dementia without behavioral disturbance: Secondary | ICD-10-CM | POA: Diagnosis not present

## 2023-01-13 DIAGNOSIS — M109 Gout, unspecified: Secondary | ICD-10-CM | POA: Diagnosis not present

## 2023-01-13 DIAGNOSIS — S92354D Nondisplaced fracture of fifth metatarsal bone, right foot, subsequent encounter for fracture with routine healing: Secondary | ICD-10-CM | POA: Diagnosis not present

## 2023-01-13 DIAGNOSIS — R600 Localized edema: Secondary | ICD-10-CM | POA: Diagnosis not present

## 2023-01-13 DIAGNOSIS — I10 Essential (primary) hypertension: Secondary | ICD-10-CM | POA: Diagnosis not present

## 2023-01-13 DIAGNOSIS — D45 Polycythemia vera: Secondary | ICD-10-CM | POA: Diagnosis not present

## 2023-01-13 DIAGNOSIS — E785 Hyperlipidemia, unspecified: Secondary | ICD-10-CM | POA: Diagnosis not present

## 2023-01-13 DIAGNOSIS — I482 Chronic atrial fibrillation, unspecified: Secondary | ICD-10-CM | POA: Diagnosis not present

## 2023-01-13 DIAGNOSIS — R5381 Other malaise: Secondary | ICD-10-CM | POA: Diagnosis not present

## 2023-01-13 DIAGNOSIS — K5900 Constipation, unspecified: Secondary | ICD-10-CM | POA: Diagnosis not present

## 2023-01-13 DIAGNOSIS — L853 Xerosis cutis: Secondary | ICD-10-CM | POA: Diagnosis not present

## 2023-01-13 DIAGNOSIS — F0150 Vascular dementia without behavioral disturbance: Secondary | ICD-10-CM | POA: Diagnosis not present

## 2023-01-13 DIAGNOSIS — E559 Vitamin D deficiency, unspecified: Secondary | ICD-10-CM | POA: Diagnosis not present

## 2023-01-14 DIAGNOSIS — C44319 Basal cell carcinoma of skin of other parts of face: Secondary | ICD-10-CM | POA: Diagnosis not present

## 2023-01-14 DIAGNOSIS — D485 Neoplasm of uncertain behavior of skin: Secondary | ICD-10-CM | POA: Diagnosis not present

## 2023-01-15 DIAGNOSIS — I482 Chronic atrial fibrillation, unspecified: Secondary | ICD-10-CM | POA: Diagnosis not present

## 2023-01-15 DIAGNOSIS — I10 Essential (primary) hypertension: Secondary | ICD-10-CM | POA: Diagnosis not present

## 2023-01-18 DIAGNOSIS — F411 Generalized anxiety disorder: Secondary | ICD-10-CM | POA: Diagnosis not present

## 2023-01-18 DIAGNOSIS — F01B18 Vascular dementia, moderate, with other behavioral disturbance: Secondary | ICD-10-CM | POA: Diagnosis not present

## 2023-01-31 DIAGNOSIS — I10 Essential (primary) hypertension: Secondary | ICD-10-CM | POA: Diagnosis not present

## 2023-02-01 DIAGNOSIS — F01B18 Vascular dementia, moderate, with other behavioral disturbance: Secondary | ICD-10-CM | POA: Diagnosis not present

## 2023-02-01 DIAGNOSIS — F411 Generalized anxiety disorder: Secondary | ICD-10-CM | POA: Diagnosis not present

## 2023-02-08 DIAGNOSIS — I482 Chronic atrial fibrillation, unspecified: Secondary | ICD-10-CM | POA: Diagnosis not present

## 2023-02-08 DIAGNOSIS — F0150 Vascular dementia without behavioral disturbance: Secondary | ICD-10-CM | POA: Diagnosis not present

## 2023-02-08 DIAGNOSIS — D45 Polycythemia vera: Secondary | ICD-10-CM | POA: Diagnosis not present

## 2023-02-08 DIAGNOSIS — K219 Gastro-esophageal reflux disease without esophagitis: Secondary | ICD-10-CM | POA: Diagnosis not present

## 2023-02-08 DIAGNOSIS — R600 Localized edema: Secondary | ICD-10-CM | POA: Diagnosis not present

## 2023-02-08 DIAGNOSIS — M109 Gout, unspecified: Secondary | ICD-10-CM | POA: Diagnosis not present

## 2023-02-08 DIAGNOSIS — R630 Anorexia: Secondary | ICD-10-CM | POA: Diagnosis not present

## 2023-02-08 DIAGNOSIS — S92354D Nondisplaced fracture of fifth metatarsal bone, right foot, subsequent encounter for fracture with routine healing: Secondary | ICD-10-CM | POA: Diagnosis not present

## 2023-02-08 DIAGNOSIS — R059 Cough, unspecified: Secondary | ICD-10-CM | POA: Diagnosis not present

## 2023-02-08 DIAGNOSIS — E559 Vitamin D deficiency, unspecified: Secondary | ICD-10-CM | POA: Diagnosis not present

## 2023-02-08 DIAGNOSIS — E785 Hyperlipidemia, unspecified: Secondary | ICD-10-CM | POA: Diagnosis not present

## 2023-02-08 DIAGNOSIS — L853 Xerosis cutis: Secondary | ICD-10-CM | POA: Diagnosis not present

## 2023-02-11 DIAGNOSIS — I482 Chronic atrial fibrillation, unspecified: Secondary | ICD-10-CM | POA: Diagnosis not present

## 2023-02-11 DIAGNOSIS — R059 Cough, unspecified: Secondary | ICD-10-CM | POA: Diagnosis not present

## 2023-02-11 DIAGNOSIS — R630 Anorexia: Secondary | ICD-10-CM | POA: Diagnosis not present

## 2023-02-11 DIAGNOSIS — L853 Xerosis cutis: Secondary | ICD-10-CM | POA: Diagnosis not present

## 2023-02-11 DIAGNOSIS — R600 Localized edema: Secondary | ICD-10-CM | POA: Diagnosis not present

## 2023-02-11 DIAGNOSIS — R5381 Other malaise: Secondary | ICD-10-CM | POA: Diagnosis not present

## 2023-02-11 DIAGNOSIS — I10 Essential (primary) hypertension: Secondary | ICD-10-CM | POA: Diagnosis not present

## 2023-02-11 DIAGNOSIS — S92354D Nondisplaced fracture of fifth metatarsal bone, right foot, subsequent encounter for fracture with routine healing: Secondary | ICD-10-CM | POA: Diagnosis not present

## 2023-02-11 DIAGNOSIS — R627 Adult failure to thrive: Secondary | ICD-10-CM | POA: Diagnosis not present

## 2023-02-11 DIAGNOSIS — K5900 Constipation, unspecified: Secondary | ICD-10-CM | POA: Diagnosis not present

## 2023-02-15 DIAGNOSIS — F411 Generalized anxiety disorder: Secondary | ICD-10-CM | POA: Diagnosis not present

## 2023-02-15 DIAGNOSIS — F01B18 Vascular dementia, moderate, with other behavioral disturbance: Secondary | ICD-10-CM | POA: Diagnosis not present

## 2023-03-01 DIAGNOSIS — F411 Generalized anxiety disorder: Secondary | ICD-10-CM | POA: Diagnosis not present

## 2023-03-01 DIAGNOSIS — F01B18 Vascular dementia, moderate, with other behavioral disturbance: Secondary | ICD-10-CM | POA: Diagnosis not present

## 2023-03-03 DIAGNOSIS — R100 Acute abdomen: Secondary | ICD-10-CM | POA: Diagnosis not present

## 2023-03-03 DIAGNOSIS — F0150 Vascular dementia without behavioral disturbance: Secondary | ICD-10-CM | POA: Diagnosis not present

## 2023-03-03 DIAGNOSIS — E785 Hyperlipidemia, unspecified: Secondary | ICD-10-CM | POA: Diagnosis not present

## 2023-03-03 DIAGNOSIS — R600 Localized edema: Secondary | ICD-10-CM | POA: Diagnosis not present

## 2023-03-03 DIAGNOSIS — R630 Anorexia: Secondary | ICD-10-CM | POA: Diagnosis not present

## 2023-03-03 DIAGNOSIS — K219 Gastro-esophageal reflux disease without esophagitis: Secondary | ICD-10-CM | POA: Diagnosis not present

## 2023-03-03 DIAGNOSIS — M109 Gout, unspecified: Secondary | ICD-10-CM | POA: Diagnosis not present

## 2023-03-03 DIAGNOSIS — R634 Abnormal weight loss: Secondary | ICD-10-CM | POA: Diagnosis not present

## 2023-03-03 DIAGNOSIS — I482 Chronic atrial fibrillation, unspecified: Secondary | ICD-10-CM | POA: Diagnosis not present

## 2023-03-03 DIAGNOSIS — D45 Polycythemia vera: Secondary | ICD-10-CM | POA: Diagnosis not present

## 2023-03-03 DIAGNOSIS — K5900 Constipation, unspecified: Secondary | ICD-10-CM | POA: Diagnosis not present

## 2023-03-03 DIAGNOSIS — I10 Essential (primary) hypertension: Secondary | ICD-10-CM | POA: Diagnosis not present

## 2023-03-04 DIAGNOSIS — I10 Essential (primary) hypertension: Secondary | ICD-10-CM | POA: Diagnosis not present

## 2023-03-04 DIAGNOSIS — R109 Unspecified abdominal pain: Secondary | ICD-10-CM | POA: Diagnosis not present

## 2023-03-04 DIAGNOSIS — R41841 Cognitive communication deficit: Secondary | ICD-10-CM | POA: Diagnosis not present

## 2023-03-04 DIAGNOSIS — F0150 Vascular dementia without behavioral disturbance: Secondary | ICD-10-CM | POA: Diagnosis not present

## 2023-03-05 DIAGNOSIS — K5900 Constipation, unspecified: Secondary | ICD-10-CM | POA: Diagnosis not present

## 2023-03-11 DIAGNOSIS — R627 Adult failure to thrive: Secondary | ICD-10-CM | POA: Diagnosis not present

## 2023-03-11 DIAGNOSIS — F0150 Vascular dementia without behavioral disturbance: Secondary | ICD-10-CM | POA: Diagnosis not present

## 2023-03-11 DIAGNOSIS — K5900 Constipation, unspecified: Secondary | ICD-10-CM | POA: Diagnosis not present

## 2023-03-11 DIAGNOSIS — F419 Anxiety disorder, unspecified: Secondary | ICD-10-CM | POA: Diagnosis not present

## 2023-03-12 DIAGNOSIS — R109 Unspecified abdominal pain: Secondary | ICD-10-CM | POA: Diagnosis not present

## 2023-03-15 DIAGNOSIS — I482 Chronic atrial fibrillation, unspecified: Secondary | ICD-10-CM | POA: Diagnosis not present

## 2023-03-15 DIAGNOSIS — I10 Essential (primary) hypertension: Secondary | ICD-10-CM | POA: Diagnosis not present

## 2023-03-15 DIAGNOSIS — F411 Generalized anxiety disorder: Secondary | ICD-10-CM | POA: Diagnosis not present

## 2023-03-15 DIAGNOSIS — F01B18 Vascular dementia, moderate, with other behavioral disturbance: Secondary | ICD-10-CM | POA: Diagnosis not present

## 2023-03-16 DIAGNOSIS — B351 Tinea unguium: Secondary | ICD-10-CM | POA: Diagnosis not present

## 2023-03-16 DIAGNOSIS — M2042 Other hammer toe(s) (acquired), left foot: Secondary | ICD-10-CM | POA: Diagnosis not present

## 2023-03-16 DIAGNOSIS — I7091 Generalized atherosclerosis: Secondary | ICD-10-CM | POA: Diagnosis not present

## 2023-03-16 DIAGNOSIS — M2041 Other hammer toe(s) (acquired), right foot: Secondary | ICD-10-CM | POA: Diagnosis not present

## 2023-03-17 DIAGNOSIS — K219 Gastro-esophageal reflux disease without esophagitis: Secondary | ICD-10-CM | POA: Diagnosis not present

## 2023-03-17 DIAGNOSIS — M109 Gout, unspecified: Secondary | ICD-10-CM | POA: Diagnosis not present

## 2023-03-17 DIAGNOSIS — I10 Essential (primary) hypertension: Secondary | ICD-10-CM | POA: Diagnosis not present

## 2023-03-17 DIAGNOSIS — R630 Anorexia: Secondary | ICD-10-CM | POA: Diagnosis not present

## 2023-03-17 DIAGNOSIS — F0150 Vascular dementia without behavioral disturbance: Secondary | ICD-10-CM | POA: Diagnosis not present

## 2023-03-17 DIAGNOSIS — D45 Polycythemia vera: Secondary | ICD-10-CM | POA: Diagnosis not present

## 2023-03-17 DIAGNOSIS — F419 Anxiety disorder, unspecified: Secondary | ICD-10-CM | POA: Diagnosis not present

## 2023-03-17 DIAGNOSIS — R627 Adult failure to thrive: Secondary | ICD-10-CM | POA: Diagnosis not present

## 2023-03-17 DIAGNOSIS — I482 Chronic atrial fibrillation, unspecified: Secondary | ICD-10-CM | POA: Diagnosis not present

## 2023-03-17 DIAGNOSIS — K5900 Constipation, unspecified: Secondary | ICD-10-CM | POA: Diagnosis not present

## 2023-03-18 DIAGNOSIS — R109 Unspecified abdominal pain: Secondary | ICD-10-CM | POA: Diagnosis not present

## 2023-03-20 ENCOUNTER — Emergency Department (HOSPITAL_COMMUNITY)

## 2023-03-20 ENCOUNTER — Other Ambulatory Visit: Payer: Self-pay

## 2023-03-20 ENCOUNTER — Emergency Department (HOSPITAL_COMMUNITY)
Admission: EM | Admit: 2023-03-20 | Discharge: 2023-03-21 | Disposition: A | Discharge status: Home / Self Care | Attending: Emergency Medicine | Admitting: Emergency Medicine

## 2023-03-20 DIAGNOSIS — S066X0A Traumatic subarachnoid hemorrhage without loss of consciousness, initial encounter: Secondary | ICD-10-CM | POA: Diagnosis not present

## 2023-03-20 DIAGNOSIS — Z7982 Long term (current) use of aspirin: Secondary | ICD-10-CM | POA: Diagnosis not present

## 2023-03-20 DIAGNOSIS — W19XXXA Unspecified fall, initial encounter: Secondary | ICD-10-CM | POA: Diagnosis not present

## 2023-03-20 DIAGNOSIS — I609 Nontraumatic subarachnoid hemorrhage, unspecified: Secondary | ICD-10-CM | POA: Diagnosis not present

## 2023-03-20 DIAGNOSIS — M19031 Primary osteoarthritis, right wrist: Secondary | ICD-10-CM | POA: Diagnosis not present

## 2023-03-20 DIAGNOSIS — S0083XA Contusion of other part of head, initial encounter: Secondary | ICD-10-CM | POA: Diagnosis not present

## 2023-03-20 DIAGNOSIS — M47816 Spondylosis without myelopathy or radiculopathy, lumbar region: Secondary | ICD-10-CM | POA: Diagnosis not present

## 2023-03-20 DIAGNOSIS — S62639A Displaced fracture of distal phalanx of unspecified finger, initial encounter for closed fracture: Secondary | ICD-10-CM | POA: Diagnosis not present

## 2023-03-20 DIAGNOSIS — M1811 Unilateral primary osteoarthritis of first carpometacarpal joint, right hand: Secondary | ICD-10-CM | POA: Diagnosis not present

## 2023-03-20 DIAGNOSIS — R296 Repeated falls: Secondary | ICD-10-CM | POA: Diagnosis not present

## 2023-03-20 DIAGNOSIS — S199XXA Unspecified injury of neck, initial encounter: Secondary | ICD-10-CM | POA: Diagnosis not present

## 2023-03-20 DIAGNOSIS — S0990XA Unspecified injury of head, initial encounter: Secondary | ICD-10-CM

## 2023-03-20 DIAGNOSIS — S62524A Nondisplaced fracture of distal phalanx of right thumb, initial encounter for closed fracture: Secondary | ICD-10-CM | POA: Diagnosis not present

## 2023-03-20 DIAGNOSIS — M25551 Pain in right hip: Secondary | ICD-10-CM | POA: Insufficient documentation

## 2023-03-20 DIAGNOSIS — R220 Localized swelling, mass and lump, head: Secondary | ICD-10-CM | POA: Diagnosis not present

## 2023-03-20 DIAGNOSIS — R58 Hemorrhage, not elsewhere classified: Secondary | ICD-10-CM | POA: Diagnosis not present

## 2023-03-20 DIAGNOSIS — Z043 Encounter for examination and observation following other accident: Secondary | ICD-10-CM | POA: Diagnosis not present

## 2023-03-20 DIAGNOSIS — M25552 Pain in left hip: Secondary | ICD-10-CM | POA: Diagnosis not present

## 2023-03-20 DIAGNOSIS — R609 Edema, unspecified: Secondary | ICD-10-CM | POA: Diagnosis not present

## 2023-03-20 DIAGNOSIS — S3993XA Unspecified injury of pelvis, initial encounter: Secondary | ICD-10-CM | POA: Diagnosis not present

## 2023-03-20 MED ORDER — FENTANYL CITRATE PF 50 MCG/ML IJ SOSY: 12.5000 ug | PREFILLED_SYRINGE | INTRAMUSCULAR | Status: DC | PRN

## 2023-03-20 MED ORDER — LIDOCAINE-EPINEPHRINE-TETRACAINE (LET) TOPICAL GEL
3.0000 mL | Freq: Once | TOPICAL | Status: AC
  Administered 2023-03-20: 3 mL via TOPICAL
  Filled 2023-03-20: qty 3

## 2023-03-20 MED ORDER — DONEPEZIL HCL 10 MG PO TABS
10.0000 mg | ORAL_TABLET | Freq: Every day | ORAL | Status: DC
  Administered 2023-03-20: 10 mg via ORAL
  Filled 2023-03-20: qty 1

## 2023-03-20 NOTE — ED Triage Notes (Signed)
 Patient to EMS from [facility] for Fall. Patient normally ambulates by wheelchair but tried to use a walker and fell at nurses station. He has knot to L forehead and laceration to R thumb.

## 2023-03-20 NOTE — ED Provider Notes (Signed)
 [place] EMERGENCY DEPARTMENT AT [facility] Provider Note   CSN: [id number] Arrival date & time: 03/20/23  1844     History  Chief Complaint  Patient presents with   Dean Pratt is a 88 y.o. male.  HPI Elderly male with dementia, presents from a nursing facility after fall.  Fall was witnessed.  He arrives via EMS.  History is obtained by the patient and EMS.  Per report the patient is supposed to be using a wheelchair and a walker, was using neither of these, fell forwards struck his head.  No reported loss of consciousness, no change in interactivity from baseline.  Patient may have complained of right hip pain after the fall, complains of nothing currently.  EMS reports patient was hemodynamically unremarkable in transport.    Home Medications Prior to Admission medications   Medication Sig Start Date End Date Taking? Authorizing Provider  acetaminophen (TYLENOL) 325 MG tablet Take 650 mg by mouth every 6 (six) hours as needed for mild pain.    [provider]  allopurinol (ZYLOPRIM) 100 MG tablet Take 100 mg by mouth at bedtime.      [provider]  Ascorbic Acid (VITAMIN C) 1000 MG tablet Take 1,000 mg by mouth daily.     [provider]  aspirin EC 81 MG tablet Take 1 tablet (81 mg total) by mouth daily. Swallow whole. 03/13/21   Micki Riley, MD  cholecalciferol (VITAMIN D3) 25 MCG (1000 UNIT) tablet Take 1,000 Units by mouth daily.    [provider]  diltiazem (CARDIZEM CD) 180 MG 24 hr capsule Take 1 capsule (180 mg total) by mouth daily. 10/12/22   Leroy Sea, MD  donepezil (ARICEPT) 10 MG tablet TAKE ONE TABLET BY MOUTH EVERYDAY AT BEDTIME Patient taking differently: Take 10 mg by mouth at bedtime. 07/13/22   Ihor Austin, NP  escitalopram (LEXAPRO) 10 MG tablet Take 10 mg by mouth daily.    [provider]  hydrALAZINE (APRESOLINE) 50 MG tablet Take 1 tablet (50 mg total) by mouth every 8  (eight) hours. 10/13/22   Leroy Sea, MD  hydroxyurea (HYDREA) 500 MG capsule TAKE ONE CAPSULE BY MOUTH AT Ladene Artist 05/12/22   Si Gaul, MD  memantine (NAMENDA) 10 MG tablet TAKE ONE TABLET BY MOUTH AT NOON and TAKE ONE TABLET BY MOUTH EVERYDAY AT BEDTIME Patient taking differently: Take 10 mg by mouth 2 (two) times daily. 01/08/22   Micki Riley, MD  OVER THE COUNTER MEDICATION Place 1 spray into both nostrils daily as needed (congestion). Congestion nasal spray    [provider]  pantoprazole (PROTONIX) 40 MG tablet Take 1 tablet (40 mg total) by mouth daily. 10/11/22   Leroy Sea, MD  rosuvastatin (CRESTOR) 10 MG tablet Take 10 mg by mouth daily. 09/06/17   [provider]  vitamin B-12 (CYANOCOBALAMIN) 1000 MCG tablet Take 1,000 mcg by mouth daily.    [provider]      Allergies    Warfarin and related and Ace inhibitors    Review of Systems   Review of Systems  Physical Exam Updated Vital Signs BP 116/83   Pulse 84   Temp 98.1 F (36.7 C) (Oral)   Resp 18   Ht 6\' 1"  (1.854 m)   Wt 68 kg   SpO2 92%   BMI 19.79 kg/m  Physical Exam Vitals and nursing note reviewed.  Constitutional:  General: He is not in acute distress.    Appearance: He is well-developed.  HENT:     Head: Normocephalic.   Eyes:     Conjunctiva/sclera: Conjunctivae normal.  Cardiovascular:     Rate and Rhythm: Normal rate and regular rhythm.  Pulmonary:     Effort: Pulmonary effort is normal. No respiratory distress.     Breath sounds: No stridor.  Abdominal:     General: There is no distension.  Musculoskeletal:       Arms:  Skin:    General: Skin is warm and dry.  Neurological:     Mental Status: He is alert.     Motor: Atrophy present.     Comments: Patient follows commands appropriately, moves all extremities to command, speech is clear, brief     ED Results / Procedures / Treatments   Labs (all labs ordered are listed, but only abnormal  results are displayed) Labs Reviewed - No data to display  EKG None  Radiology DG Hand Complete Right Result Date: 03/20/2023 CLINICAL DATA:  Larey Seat, pain EXAM: RIGHT HAND - COMPLETE 3+ VIEW COMPARISON:  None Available. FINDINGS: Frontal, oblique, and lateral views of the right hand are obtained. There is a comminuted extra-articular fracture at the base of the first distal phalanx, with dorsal angulation at the fracture site. No other acute bony abnormalities. There is multifocal osteoarthritis most pronounced throughout the interphalangeal joints and at the base of the thumb. Diffuse soft tissue swelling of the first digit and thenar eminence. IMPRESSION: 1. Comminuted transverse extra-articular fracture at the base of the first distal phalanx, with dorsal angulation at the fracture site. 2. Diffuse soft tissue swelling of the first digit. 3. Extensive multifocal osteoarthritis as above. Electronically Signed   By: Sharlet Salina M.D.   On: 03/20/2023 19:42   CT HEAD WO CONTRAST Result Date: 03/20/2023 CLINICAL DATA:  Moderate to severe head trauma, fall, not 2 left forehead and laceration to thumb EXAM: CT HEAD WITHOUT CONTRAST CT CERVICAL SPINE WITHOUT CONTRAST TECHNIQUE: Multidetector CT imaging of the head and cervical spine was performed following the standard protocol without intravenous contrast. Multiplanar CT image reconstructions of the cervical spine were also generated. RADIATION DOSE REDUCTION: This exam was performed according to the departmental dose-optimization program which includes automated exposure control, adjustment of the mA and/or kV according to patient size and/or use of iterative reconstruction technique. COMPARISON:  CT and MRI 10/09/2022; CT cervical spine 01/09/2021 FINDINGS: CT HEAD FINDINGS Brain: Small amount of acute subarachnoid hemorrhage along the right temporal and parietal lobes (for example series 3/image 13 and 3/16). Possible small 2mm focus of intraparenchymal  hemorrhage/contusion in the anterior left frontal lobe versus artifact (3/15). No evidence of acute infarct. No hydrocephalus. Age related cerebral atrophy and chronic small vessel ischemic disease. Chronic right occipital infarct. Vascular: No hyperdense vessel. Intracranial arterial calcification. Skull: No fracture or focal lesion.  Large left forehead hematoma. Sinuses/Orbits: No acute finding. Other: Traumatic Brain Injury Risk Stratification Skull Fracture: No - Low/mBIG 1 Subdural Hematoma (SDH): No - Low Subarachnoid Hemorrhage [facility]): localized,Moderate/mBIG 2 Epidural Hematoma (EDH): No - Low/mBIG 1 Cerebral contusion, intra-axial, intraparenchymal Hemorrhage (IPH): No Intraventricular Hemorrhage (IVH): No - Low/mBIG 1 Midline Shift > 1mm or Edema/effacement of sulci/vents: No - Low/mBIG 1 ---------------------------------------------------- CT CERVICAL SPINE FINDINGS Alignment: No evidence of traumatic malalignment. Similar anterolisthesis of C4. Skull base and vertebrae: No acute fracture. No primary bone lesion or focal pathologic process. Soft tissues and spinal canal: No prevertebral fluid  or swelling. No visible canal hematoma. Disc levels: Multilevel advanced spondylosis, disc space height loss, degenerative endplate changes greatest at C5-C6 and C6-C7 where it is advanced. Ankylosis of the C3-C5 facets bilaterally. No severe spinal canal narrowing. Upper chest: No acute abnormality. Other: Carotid calcification. IMPRESSION: 1. Small amount of acute subarachnoid hemorrhage along the right temporal and parietal lobes. 2. Possible small 2mm focus of intraparenchymal hemorrhage/contusion in the anterior left frontal lobe versus artifact. 3. See above for traumatic brain injury risk stratification. 4. No acute fracture in the cervical spine. Critical Value/emergent results were called by telephone at the time of interpretation on 03/20/2023 at 7:37 pm to provider Gerhard Munch , who verbally  acknowledged these results. Electronically Signed   By: Minerva Fester M.D.   On: 03/20/2023 19:42   CT CERVICAL SPINE WO CONTRAST Result Date: 03/20/2023 CLINICAL DATA:  Moderate to severe head trauma, fall, not 2 left forehead and laceration to thumb EXAM: CT HEAD WITHOUT CONTRAST CT CERVICAL SPINE WITHOUT CONTRAST TECHNIQUE: Multidetector CT imaging of the head and cervical spine was performed following the standard protocol without intravenous contrast. Multiplanar CT image reconstructions of the cervical spine were also generated. RADIATION DOSE REDUCTION: This exam was performed according to the departmental dose-optimization program which includes automated exposure control, adjustment of the mA and/or kV according to patient size and/or use of iterative reconstruction technique. COMPARISON:  CT and MRI 10/09/2022; CT cervical spine 01/09/2021 FINDINGS: CT HEAD FINDINGS Brain: Small amount of acute subarachnoid hemorrhage along the right temporal and parietal lobes (for example series 3/image 13 and 3/16). Possible small 2mm focus of intraparenchymal hemorrhage/contusion in the anterior left frontal lobe versus artifact (3/15). No evidence of acute infarct. No hydrocephalus. Age related cerebral atrophy and chronic small vessel ischemic disease. Chronic right occipital infarct. Vascular: No hyperdense vessel. Intracranial arterial calcification. Skull: No fracture or focal lesion.  Large left forehead hematoma. Sinuses/Orbits: No acute finding. Other: Traumatic Brain Injury Risk Stratification Skull Fracture: No - Low/mBIG 1 Subdural Hematoma (SDH): No - Low Subarachnoid Hemorrhage [facility]): localized,Moderate/mBIG 2 Epidural Hematoma (EDH): No - Low/mBIG 1 Cerebral contusion, intra-axial, intraparenchymal Hemorrhage (IPH): No Intraventricular Hemorrhage (IVH): No - Low/mBIG 1 Midline Shift > 1mm or Edema/effacement of sulci/vents: No - Low/mBIG 1 ---------------------------------------------------- CT  CERVICAL SPINE FINDINGS Alignment: No evidence of traumatic malalignment. Similar anterolisthesis of C4. Skull base and vertebrae: No acute fracture. No primary bone lesion or focal pathologic process. Soft tissues and spinal canal: No prevertebral fluid or swelling. No visible canal hematoma. Disc levels: Multilevel advanced spondylosis, disc space height loss, degenerative endplate changes greatest at C5-C6 and C6-C7 where it is advanced. Ankylosis of the C3-C5 facets bilaterally. No severe spinal canal narrowing. Upper chest: No acute abnormality. Other: Carotid calcification. IMPRESSION: 1. Small amount of acute subarachnoid hemorrhage along the right temporal and parietal lobes. 2. Possible small 2mm focus of intraparenchymal hemorrhage/contusion in the anterior left frontal lobe versus artifact. 3. See above for traumatic brain injury risk stratification. 4. No acute fracture in the cervical spine. Critical Value/emergent results were called by telephone at the time of interpretation on 03/20/2023 at 7:37 pm to provider Gerhard Munch , who verbally acknowledged these results. Electronically Signed   By: Minerva Fester M.D.   On: 03/20/2023 19:42   DG Wrist Complete Right Result Date: 03/20/2023 CLINICAL DATA:  Larey Seat EXAM: RIGHT WRIST - COMPLETE 3+ VIEW COMPARISON:  None Available. FINDINGS: Frontal, oblique, lateral, and ulnar deviated views of the right wrist are obtained. No  acute displaced fracture, subluxation, or dislocation. Significant osteoarthritis within the radial aspect of the carpus and first carpometacarpal joint. Soft tissues are unremarkable. IMPRESSION: 1. Osteoarthritis within the radial aspect of the wrist and at base of thumb. 2. No acute displaced fracture. Electronically Signed   By: Sharlet Salina M.D.   On: 03/20/2023 19:41   DG Pelvis Portable Result Date: 03/20/2023 CLINICAL DATA:  Trauma, fell EXAM: PORTABLE PELVIS 1-2 VIEWS COMPARISON:  01/09/2021 FINDINGS: Supine frontal  view of the pelvis includes both hips. There are no acute displaced fractures. Joint spaces are well preserved. Sacroiliac joints appear normal. Moderate spondylosis within the lower lumbar spine. IMPRESSION: 1. Moderate lower lumbar spondylosis. 2. No acute displaced pelvic fracture. Electronically Signed   By: Sharlet Salina M.D.   On: 03/20/2023 19:38    Procedures Procedures    Medications Ordered in ED Medications  lidocaine-EPINEPHrine-tetracaine (LET) topical gel (3 mLs Topical Given 03/20/23 1939)    ED Course/ Medical Decision Making/ A&P                                 Medical Decision Making Elderly male, on hospice, with dementia presents after witnessed fall with pain initially, none on ED arrival.  Patient has obvious head trauma, given concern for age, frailty, x-rays, CTs, monitoring commenced.  Patient received topical let for wound care, and to facilitate evaluation.  Patient had x-rays, CTs ordered. Pulse ox 99% room air normal  Amount and/or Complexity of Data Reviewed Independent Historian: EMS External Data Reviewed: notes. Radiology: ordered and independent interpretation performed. Decision-making details documented in ED Course.  Risk Decision regarding hospitalization. Diagnosis or treatment significantly limited by social determinants of health.  I discussed the patient's subarachnoid hemorrhage with our radiologist, subsequently with the neurosurgeon on-call.  We discussed the patient's baseline health, enrollment in hospice, plan will be for repeat head CT at 6 AM, if this is unremarkable, patient will be discharged to his facility.  10:20 PM Family aware of all findings, plan, we discussed this at length, reviewed the patient's situation, goals of care.  Patient has had immobilization of his right thumb fracture with splint, other radiographic studies are generally reassuring, without evidence for pelvic fracture, neck fracture.  His facial wounds remain  hemostatic, though with more swelling than prior.  Patient remains in good spirits, hemodynamically unremarkable.  Anticipation for repeat head CT, with unremarkable findings patient may be considered for return to his nursing facility.   Final Clinical Impression(s) / ED Diagnoses Final diagnoses:  Fall, initial encounter  Injury of head, initial encounter  Closed nondisplaced fracture of distal phalanx of right thumb, initial encounter  SAH (subarachnoid hemorrhage) (HCC)     Gerhard Munch, MD 03/20/23 2226

## 2023-03-21 ENCOUNTER — Emergency Department (HOSPITAL_COMMUNITY)

## 2023-03-21 DIAGNOSIS — I609 Nontraumatic subarachnoid hemorrhage, unspecified: Secondary | ICD-10-CM | POA: Diagnosis not present

## 2023-03-21 DIAGNOSIS — R220 Localized swelling, mass and lump, head: Secondary | ICD-10-CM | POA: Diagnosis not present

## 2023-03-21 NOTE — ED Notes (Signed)
 Pt's door needs to be left open as pt gets up fall risk bracelet placed on left arm, bed alarm on, put clean brief and socks, placed mitts on hands charge and RN made aware

## 2023-03-21 NOTE — ED Provider Notes (Signed)
 Received signout; see prior team's note for full HPI.  Signed out pending repeat CT scan for traumatic subarachnoid.  Patient continues to seemingly be at his neurologic baseline without localizing deficits.  Repeat CT scan this morning without progression.  Will discharge as planned by Dr. Jeraldine Loots.   Coral Spikes, DO 03/21/23 [phone]

## 2023-03-21 NOTE — ED Notes (Addendum)
 [facility] called for transport arrangements.

## 2023-03-21 NOTE — Discharge Instructions (Signed)
 Please follow-up with the doctors we have given you numbers for.  You may take over-the-counter Tylenol for headache pain.  Return if headache worsens, vision changes, facial droop, unilateral weakness, chest pain, shortness of breath or any new or worsening symptoms that are concerning to you.

## 2023-03-21 NOTE — ED Notes (Addendum)
 Patient transported to CT

## 2023-03-22 DIAGNOSIS — K219 Gastro-esophageal reflux disease without esophagitis: Secondary | ICD-10-CM | POA: Diagnosis not present

## 2023-03-22 DIAGNOSIS — K5900 Constipation, unspecified: Secondary | ICD-10-CM | POA: Diagnosis not present

## 2023-03-22 DIAGNOSIS — R5381 Other malaise: Secondary | ICD-10-CM | POA: Diagnosis not present

## 2023-03-22 DIAGNOSIS — R627 Adult failure to thrive: Secondary | ICD-10-CM | POA: Diagnosis not present

## 2023-03-22 DIAGNOSIS — R630 Anorexia: Secondary | ICD-10-CM | POA: Diagnosis not present

## 2023-03-22 DIAGNOSIS — I482 Chronic atrial fibrillation, unspecified: Secondary | ICD-10-CM | POA: Diagnosis not present

## 2023-03-22 DIAGNOSIS — M109 Gout, unspecified: Secondary | ICD-10-CM | POA: Diagnosis not present

## 2023-03-22 DIAGNOSIS — D45 Polycythemia vera: Secondary | ICD-10-CM | POA: Diagnosis not present

## 2023-03-22 DIAGNOSIS — I10 Essential (primary) hypertension: Secondary | ICD-10-CM | POA: Diagnosis not present

## 2023-03-22 DIAGNOSIS — K921 Melena: Secondary | ICD-10-CM | POA: Diagnosis not present

## 2023-03-22 DIAGNOSIS — F0150 Vascular dementia without behavioral disturbance: Secondary | ICD-10-CM | POA: Diagnosis not present

## 2023-03-29 ENCOUNTER — Ambulatory Visit: Payer: PPO | Admitting: Adult Health

## 2023-03-29 DIAGNOSIS — F01B18 Vascular dementia, moderate, with other behavioral disturbance: Secondary | ICD-10-CM | POA: Diagnosis not present

## 2023-03-29 DIAGNOSIS — F411 Generalized anxiety disorder: Secondary | ICD-10-CM | POA: Diagnosis not present

## 2023-04-01 DIAGNOSIS — I10 Essential (primary) hypertension: Secondary | ICD-10-CM | POA: Diagnosis not present

## 2023-04-06 DIAGNOSIS — M79644 Pain in right finger(s): Secondary | ICD-10-CM | POA: Diagnosis not present

## 2023-04-06 DIAGNOSIS — N390 Urinary tract infection, site not specified: Secondary | ICD-10-CM | POA: Diagnosis not present

## 2023-04-13 DIAGNOSIS — F01B18 Vascular dementia, moderate, with other behavioral disturbance: Secondary | ICD-10-CM | POA: Diagnosis not present

## 2023-04-13 DIAGNOSIS — F411 Generalized anxiety disorder: Secondary | ICD-10-CM | POA: Diagnosis not present

## 2023-04-14 DIAGNOSIS — S80212A Abrasion, left knee, initial encounter: Secondary | ICD-10-CM | POA: Diagnosis not present

## 2023-04-14 DIAGNOSIS — K921 Melena: Secondary | ICD-10-CM | POA: Diagnosis not present

## 2023-04-14 DIAGNOSIS — K5900 Constipation, unspecified: Secondary | ICD-10-CM | POA: Diagnosis not present

## 2023-04-14 DIAGNOSIS — I482 Chronic atrial fibrillation, unspecified: Secondary | ICD-10-CM | POA: Diagnosis not present

## 2023-04-14 DIAGNOSIS — S0081XA Abrasion of other part of head, initial encounter: Secondary | ICD-10-CM | POA: Diagnosis not present

## 2023-04-14 DIAGNOSIS — R630 Anorexia: Secondary | ICD-10-CM | POA: Diagnosis not present

## 2023-04-14 DIAGNOSIS — M109 Gout, unspecified: Secondary | ICD-10-CM | POA: Diagnosis not present

## 2023-04-14 DIAGNOSIS — K219 Gastro-esophageal reflux disease without esophagitis: Secondary | ICD-10-CM | POA: Diagnosis not present

## 2023-04-14 DIAGNOSIS — R296 Repeated falls: Secondary | ICD-10-CM | POA: Diagnosis not present

## 2023-04-14 DIAGNOSIS — D45 Polycythemia vera: Secondary | ICD-10-CM | POA: Diagnosis not present

## 2023-04-14 DIAGNOSIS — M542 Cervicalgia: Secondary | ICD-10-CM | POA: Diagnosis not present

## 2023-04-14 DIAGNOSIS — I10 Essential (primary) hypertension: Secondary | ICD-10-CM | POA: Diagnosis not present

## 2023-04-14 DIAGNOSIS — R627 Adult failure to thrive: Secondary | ICD-10-CM | POA: Diagnosis not present

## 2023-04-15 DIAGNOSIS — M542 Cervicalgia: Secondary | ICD-10-CM | POA: Diagnosis not present

## 2023-04-19 DIAGNOSIS — L989 Disorder of the skin and subcutaneous tissue, unspecified: Secondary | ICD-10-CM | POA: Diagnosis not present

## 2023-04-19 DIAGNOSIS — K921 Melena: Secondary | ICD-10-CM | POA: Diagnosis not present

## 2023-04-19 DIAGNOSIS — Z139 Encounter for screening, unspecified: Secondary | ICD-10-CM | POA: Diagnosis not present

## 2023-04-19 DIAGNOSIS — F419 Anxiety disorder, unspecified: Secondary | ICD-10-CM | POA: Diagnosis not present

## 2023-04-19 DIAGNOSIS — R5381 Other malaise: Secondary | ICD-10-CM | POA: Diagnosis not present

## 2023-04-19 DIAGNOSIS — R630 Anorexia: Secondary | ICD-10-CM | POA: Diagnosis not present

## 2023-04-19 DIAGNOSIS — I482 Chronic atrial fibrillation, unspecified: Secondary | ICD-10-CM | POA: Diagnosis not present

## 2023-04-19 DIAGNOSIS — K219 Gastro-esophageal reflux disease without esophagitis: Secondary | ICD-10-CM | POA: Diagnosis not present

## 2023-04-19 DIAGNOSIS — M109 Gout, unspecified: Secondary | ICD-10-CM | POA: Diagnosis not present

## 2023-04-19 DIAGNOSIS — D45 Polycythemia vera: Secondary | ICD-10-CM | POA: Diagnosis not present

## 2023-04-19 DIAGNOSIS — R627 Adult failure to thrive: Secondary | ICD-10-CM | POA: Diagnosis not present

## 2023-04-19 DIAGNOSIS — I10 Essential (primary) hypertension: Secondary | ICD-10-CM | POA: Diagnosis not present

## 2023-04-26 DIAGNOSIS — F01B18 Vascular dementia, moderate, with other behavioral disturbance: Secondary | ICD-10-CM | POA: Diagnosis not present

## 2023-04-26 DIAGNOSIS — F411 Generalized anxiety disorder: Secondary | ICD-10-CM | POA: Diagnosis not present

## 2023-05-05 DIAGNOSIS — I482 Chronic atrial fibrillation, unspecified: Secondary | ICD-10-CM | POA: Diagnosis not present

## 2023-05-05 DIAGNOSIS — I10 Essential (primary) hypertension: Secondary | ICD-10-CM | POA: Diagnosis not present

## 2023-05-10 DIAGNOSIS — R630 Anorexia: Secondary | ICD-10-CM | POA: Diagnosis not present

## 2023-05-10 DIAGNOSIS — K219 Gastro-esophageal reflux disease without esophagitis: Secondary | ICD-10-CM | POA: Diagnosis not present

## 2023-05-10 DIAGNOSIS — R627 Adult failure to thrive: Secondary | ICD-10-CM | POA: Diagnosis not present

## 2023-05-10 DIAGNOSIS — I10 Essential (primary) hypertension: Secondary | ICD-10-CM | POA: Diagnosis not present

## 2023-05-10 DIAGNOSIS — L989 Disorder of the skin and subcutaneous tissue, unspecified: Secondary | ICD-10-CM | POA: Diagnosis not present

## 2023-05-10 DIAGNOSIS — M109 Gout, unspecified: Secondary | ICD-10-CM | POA: Diagnosis not present

## 2023-05-10 DIAGNOSIS — K5900 Constipation, unspecified: Secondary | ICD-10-CM | POA: Diagnosis not present

## 2023-05-10 DIAGNOSIS — K921 Melena: Secondary | ICD-10-CM | POA: Diagnosis not present

## 2023-05-10 DIAGNOSIS — F419 Anxiety disorder, unspecified: Secondary | ICD-10-CM | POA: Diagnosis not present

## 2023-05-10 DIAGNOSIS — D45 Polycythemia vera: Secondary | ICD-10-CM | POA: Diagnosis not present

## 2023-05-10 DIAGNOSIS — I482 Chronic atrial fibrillation, unspecified: Secondary | ICD-10-CM | POA: Diagnosis not present

## 2023-05-12 DIAGNOSIS — M109 Gout, unspecified: Secondary | ICD-10-CM | POA: Diagnosis not present

## 2023-05-12 DIAGNOSIS — D45 Polycythemia vera: Secondary | ICD-10-CM | POA: Diagnosis not present

## 2023-05-12 DIAGNOSIS — I10 Essential (primary) hypertension: Secondary | ICD-10-CM | POA: Diagnosis not present

## 2023-05-12 DIAGNOSIS — L988 Other specified disorders of the skin and subcutaneous tissue: Secondary | ICD-10-CM | POA: Diagnosis not present

## 2023-05-12 DIAGNOSIS — K5900 Constipation, unspecified: Secondary | ICD-10-CM | POA: Diagnosis not present

## 2023-05-12 DIAGNOSIS — I482 Chronic atrial fibrillation, unspecified: Secondary | ICD-10-CM | POA: Diagnosis not present

## 2023-05-12 DIAGNOSIS — K921 Melena: Secondary | ICD-10-CM | POA: Diagnosis not present

## 2023-05-12 DIAGNOSIS — R627 Adult failure to thrive: Secondary | ICD-10-CM | POA: Diagnosis not present

## 2023-05-12 DIAGNOSIS — F419 Anxiety disorder, unspecified: Secondary | ICD-10-CM | POA: Diagnosis not present

## 2023-05-12 DIAGNOSIS — R5381 Other malaise: Secondary | ICD-10-CM | POA: Diagnosis not present

## 2023-05-12 DIAGNOSIS — K219 Gastro-esophageal reflux disease without esophagitis: Secondary | ICD-10-CM | POA: Diagnosis not present

## 2023-05-12 DIAGNOSIS — R630 Anorexia: Secondary | ICD-10-CM | POA: Diagnosis not present

## 2023-05-24 DIAGNOSIS — F411 Generalized anxiety disorder: Secondary | ICD-10-CM | POA: Diagnosis not present

## 2023-05-24 DIAGNOSIS — F01B18 Vascular dementia, moderate, with other behavioral disturbance: Secondary | ICD-10-CM | POA: Diagnosis not present

## 2023-06-09 DIAGNOSIS — I482 Chronic atrial fibrillation, unspecified: Secondary | ICD-10-CM | POA: Diagnosis not present

## 2023-06-09 DIAGNOSIS — D45 Polycythemia vera: Secondary | ICD-10-CM | POA: Diagnosis not present

## 2023-06-09 DIAGNOSIS — R627 Adult failure to thrive: Secondary | ICD-10-CM | POA: Diagnosis not present

## 2023-06-09 DIAGNOSIS — F419 Anxiety disorder, unspecified: Secondary | ICD-10-CM | POA: Diagnosis not present

## 2023-06-09 DIAGNOSIS — K219 Gastro-esophageal reflux disease without esophagitis: Secondary | ICD-10-CM | POA: Diagnosis not present

## 2023-06-09 DIAGNOSIS — R630 Anorexia: Secondary | ICD-10-CM | POA: Diagnosis not present

## 2023-06-09 DIAGNOSIS — R5381 Other malaise: Secondary | ICD-10-CM | POA: Diagnosis not present

## 2023-06-09 DIAGNOSIS — I10 Essential (primary) hypertension: Secondary | ICD-10-CM | POA: Diagnosis not present

## 2023-06-10 DIAGNOSIS — F01B18 Vascular dementia, moderate, with other behavioral disturbance: Secondary | ICD-10-CM | POA: Diagnosis not present

## 2023-06-10 DIAGNOSIS — F411 Generalized anxiety disorder: Secondary | ICD-10-CM | POA: Diagnosis not present

## 2023-06-14 DIAGNOSIS — S01302A Unspecified open wound of left ear, initial encounter: Secondary | ICD-10-CM | POA: Diagnosis not present

## 2023-06-14 DIAGNOSIS — K219 Gastro-esophageal reflux disease without esophagitis: Secondary | ICD-10-CM | POA: Diagnosis not present

## 2023-06-14 DIAGNOSIS — I482 Chronic atrial fibrillation, unspecified: Secondary | ICD-10-CM | POA: Diagnosis not present

## 2023-06-14 DIAGNOSIS — R627 Adult failure to thrive: Secondary | ICD-10-CM | POA: Diagnosis not present

## 2023-06-14 DIAGNOSIS — R5381 Other malaise: Secondary | ICD-10-CM | POA: Diagnosis not present

## 2023-06-14 DIAGNOSIS — I10 Essential (primary) hypertension: Secondary | ICD-10-CM | POA: Diagnosis not present

## 2023-07-06 DEATH — deceased

## 2023-12-16 ENCOUNTER — Encounter: Payer: Self-pay | Admitting: Internal Medicine
